# Patient Record
Sex: Male | Born: 1935
Health system: Southern US, Community
[De-identification: ages and names within clinical notes are randomized; demographics above are authoritative.]

## PROBLEM LIST (undated history)

## (undated) DIAGNOSIS — R0602 Shortness of breath: Secondary | ICD-10-CM

## (undated) DIAGNOSIS — C61 Malignant neoplasm of prostate: Secondary | ICD-10-CM

## (undated) DIAGNOSIS — K5730 Diverticulosis of large intestine without perforation or abscess without bleeding: Secondary | ICD-10-CM

## (undated) DIAGNOSIS — G4730 Sleep apnea, unspecified: Secondary | ICD-10-CM

## (undated) DIAGNOSIS — I2510 Atherosclerotic heart disease of native coronary artery without angina pectoris: Secondary | ICD-10-CM

## (undated) DIAGNOSIS — Z860100 Personal history of colon polyps, unspecified: Secondary | ICD-10-CM

## (undated) DIAGNOSIS — Z720 Tobacco use: Secondary | ICD-10-CM

## (undated) DIAGNOSIS — I4510 Unspecified right bundle-branch block: Secondary | ICD-10-CM

## (undated) DIAGNOSIS — K627 Radiation proctitis: Secondary | ICD-10-CM

## (undated) DIAGNOSIS — I6529 Occlusion and stenosis of unspecified carotid artery: Secondary | ICD-10-CM

## (undated) DIAGNOSIS — K279 Peptic ulcer, site unspecified, unspecified as acute or chronic, without hemorrhage or perforation: Secondary | ICD-10-CM

## (undated) DIAGNOSIS — E119 Type 2 diabetes mellitus without complications: Secondary | ICD-10-CM

## (undated) DIAGNOSIS — J40 Bronchitis, not specified as acute or chronic: Secondary | ICD-10-CM

## (undated) DIAGNOSIS — C4490 Unspecified malignant neoplasm of skin, unspecified: Secondary | ICD-10-CM

## (undated) DIAGNOSIS — E669 Obesity, unspecified: Secondary | ICD-10-CM

## (undated) DIAGNOSIS — I219 Acute myocardial infarction, unspecified: Secondary | ICD-10-CM

## (undated) DIAGNOSIS — I10 Essential (primary) hypertension: Secondary | ICD-10-CM

## (undated) HISTORY — PX: COLONOSCOPY W/ POLYPECTOMY: SHX1380

## (undated) HISTORY — DX: Peptic ulcer, site unspecified, unspecified as acute or chronic, without hemorrhage or perforation: K27.9

## (undated) HISTORY — DX: Malignant neoplasm of prostate: C61

## (undated) HISTORY — PX: CHOLECYSTECTOMY: SHX55

## (undated) HISTORY — DX: Type 2 diabetes mellitus without complications: E11.9

## (undated) HISTORY — DX: Sleep apnea, unspecified: G47.30

## (undated) HISTORY — DX: Essential (primary) hypertension: I10

## (undated) HISTORY — PX: HERNIA REPAIR: SHX51

## (undated) HISTORY — DX: Atherosclerotic heart disease of native coronary artery without angina pectoris: I25.10

## (undated) HISTORY — DX: Obesity, unspecified: E66.9

---

## 1992-01-07 HISTORY — PX: OTHER SURGICAL HISTORY: SHX169

## 1997-08-18 ENCOUNTER — Inpatient Hospital Stay (HOSPITAL_COMMUNITY): Admission: EM | Admit: 1997-08-18 | Discharge: 1997-08-20 | Payer: Self-pay | Admitting: Family Medicine

## 1999-01-07 DIAGNOSIS — C61 Malignant neoplasm of prostate: Secondary | ICD-10-CM

## 1999-01-07 HISTORY — DX: Malignant neoplasm of prostate: C61

## 1999-06-10 ENCOUNTER — Other Ambulatory Visit: Admission: RE | Admit: 1999-06-10 | Discharge: 1999-06-10 | Payer: Self-pay | Admitting: Urology

## 1999-07-15 ENCOUNTER — Encounter: Admission: RE | Admit: 1999-07-15 | Discharge: 1999-10-13 | Payer: Self-pay | Admitting: Radiation Oncology

## 2000-05-11 ENCOUNTER — Ambulatory Visit (HOSPITAL_COMMUNITY): Admission: RE | Admit: 2000-05-11 | Discharge: 2000-05-11 | Payer: Self-pay | Admitting: Gastroenterology

## 2000-05-11 HISTORY — PX: COLONOSCOPY: SHX174

## 2003-12-12 ENCOUNTER — Inpatient Hospital Stay (HOSPITAL_COMMUNITY): Admission: EM | Admit: 2003-12-12 | Discharge: 2003-12-14 | Payer: Self-pay | Admitting: Emergency Medicine

## 2003-12-13 HISTORY — PX: CARDIAC CATHETERIZATION: SHX172

## 2004-01-25 ENCOUNTER — Ambulatory Visit: Payer: Self-pay | Admitting: Cardiology

## 2004-04-11 ENCOUNTER — Ambulatory Visit: Payer: Self-pay | Admitting: Cardiology

## 2004-06-06 ENCOUNTER — Ambulatory Visit: Payer: Self-pay | Admitting: Cardiology

## 2004-12-25 ENCOUNTER — Ambulatory Visit: Payer: Self-pay | Admitting: Cardiology

## 2005-10-22 ENCOUNTER — Ambulatory Visit: Payer: Self-pay | Admitting: Cardiology

## 2006-11-04 ENCOUNTER — Ambulatory Visit: Payer: Self-pay | Admitting: Cardiology

## 2007-11-10 ENCOUNTER — Ambulatory Visit: Payer: Self-pay | Admitting: Cardiology

## 2009-03-14 DIAGNOSIS — E1165 Type 2 diabetes mellitus with hyperglycemia: Secondary | ICD-10-CM

## 2009-03-14 DIAGNOSIS — E119 Type 2 diabetes mellitus without complications: Secondary | ICD-10-CM | POA: Insufficient documentation

## 2009-03-14 DIAGNOSIS — I10 Essential (primary) hypertension: Secondary | ICD-10-CM | POA: Insufficient documentation

## 2009-03-14 DIAGNOSIS — I2510 Atherosclerotic heart disease of native coronary artery without angina pectoris: Secondary | ICD-10-CM | POA: Insufficient documentation

## 2009-03-14 DIAGNOSIS — G4730 Sleep apnea, unspecified: Secondary | ICD-10-CM | POA: Insufficient documentation

## 2009-03-14 DIAGNOSIS — E1129 Type 2 diabetes mellitus with other diabetic kidney complication: Secondary | ICD-10-CM

## 2009-03-16 ENCOUNTER — Ambulatory Visit: Payer: Self-pay | Admitting: Cardiology

## 2009-03-16 DIAGNOSIS — E785 Hyperlipidemia, unspecified: Secondary | ICD-10-CM | POA: Insufficient documentation

## 2009-03-16 DIAGNOSIS — E782 Mixed hyperlipidemia: Secondary | ICD-10-CM | POA: Insufficient documentation

## 2009-03-16 DIAGNOSIS — E669 Obesity, unspecified: Secondary | ICD-10-CM

## 2009-03-16 DIAGNOSIS — F17200 Nicotine dependence, unspecified, uncomplicated: Secondary | ICD-10-CM | POA: Insufficient documentation

## 2010-02-05 NOTE — Assessment & Plan Note (Signed)
Summary: APPT @ 2:45/F2Y/JML   Visit Type:  Follow-up Primary Provider:  Dr. Felipa Eth  CC:  CAD.  History of Present Illness: The patient presents for follow up of his known CAD.  It has been about 2 years since I last saw him. In that time he has had no new cardiovascular complaints. Unfortunately he continues to smoke cigarettes. He is unable to exercise because of joint and leg pains. His weight and his blood sugar are not well controlled. I do not have his most recent lipids. Despite this he denies any chest pressure, neck or arm discomfort. He has had no palpitations, presyncope or syncope. He has had no PND or orthopnea. He has no new leg swelling or cough.  Current Medications (verified): 1)  Metoprolol Succinate 25 Mg Xr24h-Tab (Metoprolol Succinate) .Marland Kitchen.. 1 By Mouth Daily 2)  Simvastatin 40 Mg Tabs (Simvastatin) .Marland Kitchen.. 1 By Mouth Daily 3)  Plavix 75 Mg Tabs (Clopidogrel Bisulfate) .Marland Kitchen.. 1 By Mouth Daily 4)  Micardis Hct 40-12.5 Mg Tabs (Telmisartan-Hctz) .Marland Kitchen.. 1 By Mouth Daily 5)  Nu-Iron 150 Mg Caps (Polysaccharide Iron Complex) .Marland Kitchen.. 1 By Mouth Daily 6)  Glucovance 2.5-500 Mg Tabs (Glyburide-Metformin) .Marland Kitchen.. 1 By Mouth Three Times A Day 7)  Aspirin 81 Mg  Tabs (Aspirin) .Marland Kitchen.. 1 By Mouth Daily  Allergies (verified): No Known Drug Allergies  Past History:  Past Medical History: Reviewed history from 03/14/2009 and no changes required. Coronary artery disease (2005 LAD 30-40% stenosis   followed by 60-65% focal stenosis.  The second diagonal had 80%   stenosis.  The circumflex had 85-90% mid stenosis and was stented with a   Taxus stent.  The obtuse marginal had 20-25% stenosis and obtuse   marginal-2 had 40-50% stenosis.  The right coronary artery had 40-50%   proximal stenosis and mid 75% stenosis), well-preserved ejection   fraction (50-55% with inferobasal hypokinesis), diabetes mellitus x13   years, hypertension for greater than 50 years, sleep apnea treated with   CPAP, prostate  cancer, peptic ulcer disease, trauma of the left arm with   near amputation, multiple left arm surgeries, cholecystectomy, right   inguinal herniorrhaphy.      Past Surgical History:  Trauma to the left arm with near-amputation, multiple left arm surgeries.  Cholecystectomy .Right inguinal hernia repair.  Review of Systems       Double vision, hip pain.  Otherwise as stated in the history of present illness negative for other systems.  Vital Signs:  Patient profile:   77 year old male Height:      66 inches Weight:      237 pounds BMI:     38.39 Pulse rate:   67 / minute Resp:     16 per minute BP sitting:   142 / 78  (right arm)  Vitals Entered By: Marrion Coy, CNA (March 16, 2009 2:48 PM)  Physical Exam  General:  Well developed, well nourished, in no acute distress. Head:  normocephalic and atraumatic Eyes:  PERRLA/EOM intact; conjunctiva and lids normal. Mouth:  Teeth, gums and palate normal. Oral mucosa normal. Neck:  Neck supple, no JVD. No masses, thyromegaly or abnormal cervical nodes. Chest Wall:  no deformities or breast masses noted Lungs:  Clear bilaterally to auscultation and percussion. Abdomen:  Bowel sounds positive; abdomen soft and non-tender without masses, organomegaly, or hernias noted. No hepatosplenomegaly, obese Msk:  Back normal, normal gait. Muscle strength and tone normal. Extremities:  status post multiple trauma surgeries Neurologic:  Alert and  oriented x 3. Skin:  Intact without lesions or rashes. Cervical Nodes:  no significant adenopathy Inguinal Nodes:  no significant adenopathy Psych:  Normal affect.   Detailed Cardiovascular Exam  Neck    Carotids: Carotids full and equal bilaterally without bruits.      Neck Veins: Normal, no JVD.    Heart    Inspection: no deformities or lifts noted.      Palpation: normal PMI with no thrills palpable.      Auscultation: regular rate and rhythm, S1, S2 without murmurs, rubs, gallops, or  clicks.    Vascular    Abdominal Aorta: no palpable masses, pulsations, or audible bruits.      Femoral Pulses: normal femoral pulses bilaterally.      Pedal Pulses: normal pedal pulses bilaterally.      Radial Pulses: normal radial pulses bilaterally.      Peripheral Circulation: no clubbing, cyanosis, or edema noted with normal capillary refill.     EKG  Procedure date:  03/16/2009  Findings:      sinus rhythm, rate 67, first degree AV block, right bundle branch block, no acute ST-T wave changes.  Impression & Recommendations:  Problem # 1:  CAD (ICD-414.00) The patient has no new symptoms. Unfortunately he is not anticipating in risk reduction. This will be reemphasized. No further testing is suggested at this point. Orders: EKG w/ Interpretation (93000)  Problem # 2:  TOBACCO ABUSE (ICD-305.1) We discussed the need to stop smoking. He says he's tried everything except Chantix and will not try this.  Problem # 3:  DYSLIPIDEMIA (ICD-272.4) He lots of muscle aches. It is prudent to try him off of simvastatin for one month to see if he has any improvement. He will let us know either way. If there is no change I will restart this.  Problem # 4:  OBESITY, UNSPECIFIED (ICD-278.00) He understands the need to lose weight with diet and exercise.  Patient Instructions: 1)  Your physician recommends that you schedule a follow-up appointment in: 12 months with Dr Antoine Poche.  You should recieve a letter of reminder approximatley 2 months before your appointment time.  If you do not please call our office to schedule. 2)  Your physician has recommended you make the following change in your medication: hold your simvastatin for one month if no improvement in your leg discomfort restart the simvastatin.

## 2010-05-01 ENCOUNTER — Encounter: Payer: Self-pay | Admitting: Cardiology

## 2010-05-01 ENCOUNTER — Encounter: Payer: Self-pay | Admitting: *Deleted

## 2010-05-02 ENCOUNTER — Encounter: Payer: Self-pay | Admitting: Cardiology

## 2010-05-02 ENCOUNTER — Ambulatory Visit (INDEPENDENT_AMBULATORY_CARE_PROVIDER_SITE_OTHER): Payer: Medicare Other | Admitting: Cardiology

## 2010-05-02 VITALS — BP 150/80 | HR 63 | Ht 68.0 in | Wt 230.0 lb

## 2010-05-02 DIAGNOSIS — I2510 Atherosclerotic heart disease of native coronary artery without angina pectoris: Secondary | ICD-10-CM

## 2010-05-02 DIAGNOSIS — F17200 Nicotine dependence, unspecified, uncomplicated: Secondary | ICD-10-CM

## 2010-05-02 DIAGNOSIS — E785 Hyperlipidemia, unspecified: Secondary | ICD-10-CM

## 2010-05-02 DIAGNOSIS — I10 Essential (primary) hypertension: Secondary | ICD-10-CM

## 2010-05-02 DIAGNOSIS — E669 Obesity, unspecified: Secondary | ICD-10-CM

## 2010-05-02 DIAGNOSIS — R0989 Other specified symptoms and signs involving the circulatory and respiratory systems: Secondary | ICD-10-CM

## 2010-05-02 MED ORDER — TELMISARTAN-HCTZ 80-12.5 MG PO TABS: 1.0000 | ORAL_TABLET | Freq: Every day | ORAL | Status: DC

## 2010-05-02 NOTE — Assessment & Plan Note (Signed)
The patient has some atypical chest pain. Unfortunately he doesn't participate and risk reduction. He has a low functional level because of knee pain. He has not had any evaluation of his coronaries since 2005. Stress perfusion imaging is indicated.

## 2010-05-02 NOTE — Assessment & Plan Note (Signed)
I will check carotid Dopplers.

## 2010-05-02 NOTE — Progress Notes (Signed)
HPI The patient presents for a one-year followup. Since I last saw him he has had some chest discomfort that is infrequent and he thinks he is gas. He will take over-the-counter medications without relief. He does not think it is similar to previous angina. This happens at rest. He does not exert himself because of knee pain. He continues to smoke cigarettes. He has tried to cut down his food intake and has lost a few pounds. He denies any new shortness of breath though he gets dyspneic with moderate exertion. He has had no PND or orthopnea. He has had no edema.  No Known Allergies  Current Outpatient Prescriptions  Medication Sig Dispense Refill  . aspirin 81 MG tablet Take 81 mg by mouth daily.        . cholestyramine light (PREVALITE) 4 G packet Take 4 g by mouth daily.        . clopidogrel (PLAVIX) 75 MG tablet Take 75 mg by mouth daily.        . metFORMIN (GLUCOPHAGE) 500 MG tablet Take 500 mg by mouth 3 (three) times daily.        . metoprolol succinate (TOPROL-XL) 25 MG 24 hr tablet Take 25 mg by mouth daily.        Marland Kitchen omeprazole (PRILOSEC) 20 MG capsule Take 20 mg by mouth daily.        . simvastatin (ZOCOR) 40 MG tablet Take 40 mg by mouth at bedtime.        Marland Kitchen telmisartan-hydrochlorothiazide (MICARDIS HCT) 40-12.5 MG per tablet Take 1 tablet by mouth daily.          Past Medical History  Diagnosis Date  . CAD (coronary artery disease)     (2005 LAD 30-40% stenosis  followed by 60-65% focal stenosis.  The second diagonal had 80%   stenosis.  The circumflex had 85-90% mid stenosis and was stented with a  Taxus stent.  The obtuse marginal had 20-25% stenosis and obtuse   marginal-2 had 40-50% stenosis.  The right coronary artery had 40-50%  proximal stenosis and mid 75% stenosis), well-preserved ejection  fraction (50-55% )  . Dyslipidemia   . Obesity   . HTN (hypertension)   . Sleep apnea   . PUD (peptic ulcer disease)   . DM (diabetes mellitus)   . Prostate cancer     Past  Surgical History  Procedure Date  . Cholecystectomy   . Cardiac catheterization     ROS:  As stated in the HPI and negative for all other systems.  PHYSICAL EXAM BP 150/80  Pulse 63  Ht 5\' 8"  (1.727 m)  Wt 230 lb (104.327 kg)  BMI 34.97 kg/m2 General:  Well developed, well nourished, in no acute distress. Head:  normocephalic and atraumatic Eyes:  PERRLA/EOM intact; conjunctiva and lids normal. Mouth:  Teeth, gums and palate normal. Oral mucosa normal. Neck:  Neck supple, no JVD. No masses, thyromegaly or abnormal cervical nodes. Chest Wall:  no deformities or breast masses noted Lungs:  Clear bilaterally to auscultation and percussion. Abdomen:  Bowel sounds positive; abdomen soft and non-tender without masses, organomegaly, or hernias noted. No hepatosplenomegaly, obese Msk:  Back normal, normal gait. Muscle strength and tone normal. Extremities:  status post multiple trauma surgeries Neurologic:  Alert and oriented x 3. Skin:  Intact without lesions or rashes. Cervical Nodes:  no significant adenopathy Inguinal Nodes:  no significant adenopathy Psych:  Normal affect.   Detailed Cardiovascular Exam  Neck  Carotids: Carotids full and equal bilaterally right bruit.      Neck Veins: Normal, no JVD.    Heart    Inspection: no deformities or lifts noted.      Palpation: normal PMI with no thrills palpable.      Auscultation: regular rate and rhythm, S1, S2 soft apical systolic early peaking murmur, rubs, gallops, or clicks.    Vascular    Abdominal Aorta: no palpable masses, pulsations, or audible bruits.      Femoral Pulses: normal femoral pulses bilaterally.      Pedal Pulses: normal pedal pulses bilaterally.      Radial Pulses: normal radial pulses bilaterally.      Peripheral Circulation: no clubbing, cyanosis, or edema noted with normal capillary refill.    EKG:  Sinus rhythm, right bundle branch block, no acute ST-T wave changes  ASSESSMENT AND PLAN

## 2010-05-02 NOTE — Assessment & Plan Note (Signed)
His blood pressure has not been at target. I will increase his Telmisartan/hct to 80/12.5 With a basic metabolic profile in 2 weeks.

## 2010-05-02 NOTE — Assessment & Plan Note (Signed)
This is followed by Dr. Felipa Eth.

## 2010-05-02 NOTE — Assessment & Plan Note (Signed)
We have discussed this at length. He says he cannot quit smoking. He does not want any smoking cessation aids.

## 2010-05-02 NOTE — Patient Instructions (Signed)
You are being scheduled for a stress myoview.  Please see the instruction sheet provided. You are being scheduled for a carotid doppler. Increase Micardis to 80/12.5 mg a day. (RX send to [facility] on [address]) Have blood work in 2 weeks (BMP 401.1 v58.69) See Dr Antoine Poche back in 1 year

## 2010-05-02 NOTE — Assessment & Plan Note (Signed)
The patient understands the need to lose weight with diet and exercise. We have discussed specific strategies for this.  

## 2010-05-07 ENCOUNTER — Encounter: Payer: Self-pay | Admitting: Cardiology

## 2010-05-14 ENCOUNTER — Encounter (INDEPENDENT_AMBULATORY_CARE_PROVIDER_SITE_OTHER): Payer: Medicare Other | Admitting: *Deleted

## 2010-05-14 ENCOUNTER — Ambulatory Visit (HOSPITAL_COMMUNITY): Payer: Medicare Other | Attending: Cardiology | Admitting: Radiology

## 2010-05-14 ENCOUNTER — Other Ambulatory Visit (INDEPENDENT_AMBULATORY_CARE_PROVIDER_SITE_OTHER): Payer: Medicare Other | Admitting: *Deleted

## 2010-05-14 VITALS — Ht 68.0 in | Wt 227.0 lb

## 2010-05-14 DIAGNOSIS — R0789 Other chest pain: Secondary | ICD-10-CM

## 2010-05-14 DIAGNOSIS — I2510 Atherosclerotic heart disease of native coronary artery without angina pectoris: Secondary | ICD-10-CM

## 2010-05-14 DIAGNOSIS — I10 Essential (primary) hypertension: Secondary | ICD-10-CM

## 2010-05-14 DIAGNOSIS — I4510 Unspecified right bundle-branch block: Secondary | ICD-10-CM

## 2010-05-14 DIAGNOSIS — I6529 Occlusion and stenosis of unspecified carotid artery: Secondary | ICD-10-CM

## 2010-05-14 DIAGNOSIS — R0989 Other specified symptoms and signs involving the circulatory and respiratory systems: Secondary | ICD-10-CM

## 2010-05-14 DIAGNOSIS — R0609 Other forms of dyspnea: Secondary | ICD-10-CM

## 2010-05-14 LAB — BASIC METABOLIC PANEL
Calcium: 9.2 mg/dL (ref 8.4–10.5)
Creatinine, Ser: 1.5 mg/dL (ref 0.4–1.5)

## 2010-05-14 MED ORDER — TECHNETIUM TC 99M TETROFOSMIN IV KIT
11.0000 | PACK | Freq: Once | INTRAVENOUS | Status: AC | PRN
  Administered 2010-05-14: 11 via INTRAVENOUS

## 2010-05-14 MED ORDER — REGADENOSON 0.4 MG/5ML IV SOLN
0.4000 mg | Freq: Once | INTRAVENOUS | Status: AC
  Administered 2010-05-14: 0.4 mg via INTRAVENOUS

## 2010-05-14 MED ORDER — TECHNETIUM TC 99M TETROFOSMIN IV KIT
33.0000 | PACK | Freq: Once | INTRAVENOUS | Status: AC | PRN
  Administered 2010-05-14: 33 via INTRAVENOUS

## 2010-05-14 NOTE — Progress Notes (Addendum)
[facility] SITE 3 NUCLEAR MED [address] [place] [address] [phone]  Cardiology Nuclear Med Study  [name] [name] is a 77 y.o. male [id number] 02-23-1932   Nuclear Med Background Indication for Stress Test:  Evaluation for Ischemia, PTCA/Stent Patency  History:  '05 PTCA/Stent-CFX, EF=50-55% Cardiac Risk Factors: Family History - CAD, Hypertension, Lipids, NIDDM, Obesity, RBBB, Smoker and Carotid Bruit  Symptoms:  Chest Tightness (last episode of chest discomfort was about six months ago), DOE and Fatigue   Nuclear Pre-Procedure Caffeine/Decaff Intake:  None NPO After: 9:00pm   Lungs:  Diminished, but clear.  O2 sat 96% on RA. IV 0.9% NS with Angio Cath:  24g  IV Site: R Wrist  IV Started by:  Irean Hong, RN  Chest Size (in):  48 Cup Size: n/a  Height: 5\' 8"  (1.727 m)  Weight:  227 lb (102.967 kg)  BMI:  Body mass index is 34.52 kg/(m^2). Tech Comments:  Toprol held x 24 hours.  No medications taken today.    Nuclear Med Study 1 or 2 day study: 1 day  Stress Test Type:  Eugenie Birks  Reading MD: Arvilla Meres, MD  Order Authorizing Provider:  [name], MD  Resting Radionuclide: Technetium 25m Tetrofosmin  Resting Radionuclide Dose: 11.0 mCi   Stress Radionuclide:  Technetium 41m Tetrofosmin  Stress Radionuclide Dose: 33.0 mCi           Stress Protocol Rest HR: 59 Stress HR: 75  Rest BP: 125/47 Stress BP: 109/52  Exercise Time (min): n/a METS: n/a   Predicted Max HR: 145 bpm % Max HR: 51.72 bpm Rate Pressure Product: 8175   Dose of Adenosine (mg):  n/a Dose of Lexiscan: 0.4 mg  Dose of Atropine (mg): n/a Dose of Dobutamine: n/a mcg/kg/min (at max HR)  Stress Test Technologist: Smiley Houseman, CMA-N  Nuclear Technologist:  Domenic Polite, CNMT     Rest Procedure:  Myocardial perfusion imaging was performed at rest 45 minutes following the intravenous administration of Technetium 64m Tetrofosmin. Rest ECG: RBBB.  Stress  Procedure:  The patient received IV Lexiscan 0.4 mg over 15-seconds.  Technetium 66m Tetrofosmin injected at 30-seconds.  There were no significant changes with Lexiscan.  Quantitative spect images were obtained after a 45 minute delay. Stress ECG: No significant change from baseline ECG  QPS Raw Data Images:  Patient motion noted; appropriate software correction applied. Stress Images:  There is moderately decreased uptake in the inferior wall. Rest Images:  There is mildly decreased uptake in the inferior wall. Subtraction (SDS): There is a mild reversible defect in the inferior wall suggestive of ischemia (cannot exclude diaphragmatic attenuation).  Transient Ischemic Dilatation (Normal <1.22):  0.99 Lung/Heart Ratio (Normal <0.45):  0.31  Quantitative Gated Spect Images QGS EDV:  125 ml QGS ESV:  49 ml QGS cine images:  NL LV Function; NL Wall Motion QGS EF: 61%  Impression Exercise Capacity:  Lexiscan with no exercise. BP Response:  n/a Clinical Symptoms:  n/a ECG Impression:  No significant ECG changes with Lexiscan. Comparison with Prior Nuclear Study: No previous nuclear study performed  Overall Impression:  Abnormal stress nuclear study. There is a mildly reversible defect in the inferior wall suggestive of possible ischemia (versus diaphragmatic attenuation).    [name] [name]   There is a defect.  I will bring him back to discuss these results.  Consider medical management vs. Cath.  [name]

## 2010-05-15 NOTE — Progress Notes (Signed)
COPY ROUTED TO DR.HOCHREIN.Falecha Clark ° °

## 2010-05-16 ENCOUNTER — Encounter: Payer: Self-pay | Admitting: Cardiology

## 2010-05-21 NOTE — Assessment & Plan Note (Signed)
[name]                            CARDIOLOGY OFFICE NOTE   ELLIAS, MCELREATH                         MRN:          [id number]  DATE:11/04/2006                            DOB:          Nov 12, 1932    The primary is [name] [name].   REASON FOR PRESENTATION:  Evaluate patient with coronary disease.   HISTORY OF PRESENT ILLNESS:  The patient is now 77 years old. He  presents for follow up of his coronary disease. In the last year, he has  been limited by bilateral knee pain. He blames this on his medicines.  However, he does not want to have any evaluation by an orthopedist, as  he would not want any surgery. He denies any chest discomfort, neck  discomfort, arm discomfort, activity-induced nausea, vomiting, excessive  diaphoresis. He has not been having any palpitations, presyncope or  syncope. He has been having no PND or orthopnea. He continues to smoke  cigarettes unfortunately.   PAST MEDICAL HISTORY:  1. Coronary artery disease (2005, LAD 30 to 40% long stenosis followed      by 60-65% focal stenosis, second diagonal had 80% stenosis, the      circumflex had 85-90% mid stenosis and was stented with the Taxus      stent. An obtuse marginal had 20-25% stenosis. An obtuse marginal 2      had 40-50% stenosis. The right coronary artery had 40-50% proximal      stenosis and mid 75% stenosis).  2. Well-preserved ejection fraction (50-55% with inferobasilar      hypokinesis).  3. Diabetes mellitus for 12 years.  4. Hypertension for greater than 50 years.  5. Sleep apnea treated with CPAP.  6. Prostate cancer.  7. Peptic ulcer disease.  8. Trauma to the left arm with near amputation.  9. Multiple left arm surgeries.  10.Cholecystectomy.  11.Right inguinal herniorrhaphy.   ALLERGIES:  None.   1. Plavix 75 mg daily.  2. Aspirin 81 mg daily.  3. Toprol 25 mg daily.  4. Simvastatin 40 mg daily.  5. Glucovance 2.5/500 b.i.d.  6. Micardis 40/12.5  daily.  7. Omega 3 fish oil.   REVIEW OF SYSTEMS:  As stated in the HPI. Positive for erectile  dysfunction. Negative for other systems.   PHYSICAL EXAMINATION:  The patient is in no distress. Blood pressure  122/70, heart rate 60 and regular, weight 235 pounds, body mass index  36.  HEENT:  Eyelids unremarkable; pupils are equal, round, and reactive to  light; fundi not visualized. Oral mucosal unremarkable.  NECK:  No jugular venous distention at 45 degrees; carotid upstrokes  brisk and symmetrical; no bruits; no thyromegaly.  LYMPHATICS:  No cervical, axillary, or inguinal adenopathy.  LUNGS:  Clear to auscultation bilaterally.  BACK:  No costovertebral angle tenderness.  CHEST:  Unremarkable.  HEART:  PMI not displaced or sustained; S1 and S2 within normal limits;  no S3, no S4, no clicks, no rubs, no murmurs.  ABDOMEN:  Obese, positive bowel sounds, normal in frequency and pitch,  no bruits, no rebound, no guarding,  no midline pulsatile mass, no  hepatomegaly, no splenomegaly.  SKIN:  No rashes, no nodules.  EXTREMITIES:  2+ pulses, no edema.   EKG:  Sinus rhythm, right bundle branch block, first degree AV block, no  acute ST-T wave changes.   ASSESSMENT AND PLAN:  1. Coronary disease. The patient has coronary disease as described. He      is not having any further cardiovascular symptoms. He is somewhat      limited functionally. However, I am going to manage this patient      medically again this year. He will continue with risk reduction.  2. Tobacco. I counseled about the need to stop smoking. I do not think      he will do this.  3. Obesity. He understands he needs to loose weight with diet and      exercise.  4. Hypertension. Blood pressure is controlled. He will continue the      medications as listed. Of note, his beta blocker had to be reduced      because of bradycardia.  5. Dyslipidemia. He is concerned that this medications are causing      knee pain. I doubt  that this is the case. He probably has      osteoarthritis given his obesity. He refuses to see an orthopedist.      It would not be unreasonable to try one month off of the      simvastatin. If he has resolution of symptoms, I suggest he restart      it to see if they return. If so, then we clearly have demonstrated      it is the statin, and he could be treated with an alternative.  6. Followup. I will see him back in one year or sooner if needed.     [name], MD, [facility]  Electronically Signed    JH/MedQ  DD: 11/04/2006  DT: 11/05/2006  Job #: [id number]

## 2010-05-21 NOTE — Assessment & Plan Note (Signed)
[facility]                            CARDIOLOGY OFFICE NOTE   ROLDAN, LAFOREST                         MRN:          [id number]  DATE:11/10/2007                            DOB:          1932-07-07    PRIMARY CARE PHYSICIAN:  Larina Earthly, MD   REASON FOR PRESENTATION:  Evaluate the patient with coronary artery  disease.   HISTORY OF PRESENT ILLNESS:  The patient is now 77 years old.  He  presents for yearly followup.  Since I last saw him, he has had no  cardiovascular complaints.  He does complain of some aching in his leg  when he sits for too long.  This is his left leg.  He also has pain in  his knees with walking.  This somewhat limits him.  However, he does not  get any of the chest discomfort, he had in 2005 at the time of his  angioplasty.  He does not get any neck discomfort or arm discomfort.  He  has no new shortness of breath, though he gets dyspneic with moderate  exertion.  He is not having any resting shortness of breath, denies any  PND or orthopnea.  He has had no palpitation, presyncope, or syncope.   PAST MEDICAL HISTORY:  Coronary artery disease (2005 LAD 30-40% stenosis  followed by 60-65% focal stenosis.  The second diagonal had 80%  stenosis.  The circumflex had 85-90% mid stenosis and was stented with a  Taxus stent.  The obtuse marginal had 20-25% stenosis and obtuse  marginal-2 had 40-50% stenosis.  The right coronary artery had 40-50%  proximal stenosis and mid 75% stenosis), well-preserved ejection  fraction (50-55% with inferobasal hypokinesis), diabetes mellitus x13  years, hypertension for greater than 50 years, sleep apnea treated with  CPAP, prostate cancer, peptic ulcer disease, trauma of the left arm with  near amputation, multiple left arm surgeries, cholecystectomy, right  inguinal herniorrhaphy.   ALLERGIES:  None.   MEDICATIONS:  1. Micardis HCT 40/12.5 daily.  2. Glyburide/metformin 2.5/500 two pills  q.a.m. and one pill q.p.m.  3. Plavix 75 mg daily.  4. Nu-Iron.  5. Simvastatin 40 mg daily.  6. Metoprolol 25 mg daily.  7. Aspirin 81 mg daily.   REVIEW OF SYSTEMS:  As stated in the HPI and otherwise negative for  other systems.   PHYSICAL EXAMINATION:  GENERAL:  The patient is in no distress.  VITAL SIGNS:  Blood pressure 134/72, heart rate 63 and regular, weight  235 pounds, body mass index 35.  NECK:  No jugular venous distention at 45 degrees.  Carotid upstroke  brisk and symmetric.  No bruits.  No thyromegaly.  LYMPHATICS:  No cervical, axillary or inguinal adenopathy.  LUNGS:  Clear to auscultation bilaterally.  BACK:  No costovertebral mass.  CHEST:  Unremarkable.  HEART:  PMI not displaced or sustained.  S1 and S2 within normal limits.  No S3, no S4, no clicks, no rubs, no murmurs.  ABDOMEN:  Obese, positive bowel sounds normal in frequency and pitch, no  bruits, no rebound, no  guarding or midline pulsatile mass.  No  hepatomegaly, no splenomegaly.  SKIN:  No rashes, no nodules.  EXTREMITIES:  2+ pulse, no edema.  NEURO:  Grossly intact.   EKG sinus rhythm, rate 63, right bundle-branch block, first-degree AV  block, no acute ST-T wave changes, no significant change from previous  EKGs.   ASSESSMENT AND PLAN:  1. Coronary artery disease.  The patient has no new symptoms.  No      further cardiovascular testing is suggested.  He should continue      with risk reduction.  2. Diabetes per Dr. Felipa Eth.  3. Dyslipidemia.  The patient's at the last lab that I saw had a low      HDL, though his LDL was in the 40s.  His triglycerides were      elevated.  He was told to start taking fish oil which he does try      to do.  It gives him some diarrhea so he takes it when he can.  He      will continue on the simvastatin as well.  This can be followed by      Dr. Felipa Eth with a goal LDL less than 70 and HDL greater than 40.  4. Obesity.  He understands the need to lose weight with  diet and      exercise, although the exercise part will be very difficult.  5. Followup.  I will see him back in 1 year or sooner if needed.     [name], MD, [facility]  Electronically Signed    JH/MedQ  DD: 11/10/2007  DT: 11/11/2007  Job #: [id number]   cc:   Larina Earthly, M.D.

## 2010-05-23 ENCOUNTER — Telehealth: Payer: Self-pay | Admitting: *Deleted

## 2010-05-23 NOTE — Telephone Encounter (Signed)
Attempted to call pt with results of myoview and to make him aware of a follow up appt scheduled for Monday 05/27/10 at 9:15am with Dr [name].  There was no answer and I was unable to leave a message.  Will forward to the triage nurse to continue to contact pt tomorrow.  Results of myoview read:  Abnormal stress nuclear study.  There is a mildly reversible defect in the inferior wall suggestive of possible ischemia versus diaphragmatic attenuation.  Dr Antoine Poche would like to see the pt to review the results, his symptoms and discuss further treatment.

## 2010-05-24 NOTE — Procedures (Signed)
[place]. [facility]  Patient:    [name] [name], [name] [name]                         MRN: [id number] Proc. Date: 05/11/00 Adm. Date:  [id number] Attending:  Rich Brave CC:         Monica Becton, M.D.   Procedure Report  PROCEDURE PERFORMED:  Colonoscopy.  ENDOSCOPIST:  Florencia Reasons, M.D.  INDICATIONS FOR PROCEDURE:  The patient is a 77 year old with prior history of colonic adenomas having been removed, with his most recent exam, five years ago, having been negative.  FINDINGS:  Mild radiation proctitis and minimal sigmoid diverticulosis.  DESCRIPTION OF PROCEDURE:  The nature, purpose and risks of the procedure were familiar to the patient, who provided written consent.  Sedation was fentanyl 60 mcg and Versed 6 mg IV without arrhythmias or desaturation.  Digital exam showed a diminutive prostate without any mass effect.  The Olympus adjustable tension pediatric colonoscope was advanced the cecum without much difficulty, turning the patient onto his back and applying some external abdominal compression.  The cecum was identified by clear visualization of the appendiceal orifice.  Pullback was then performed.  The quality of the prep was excellent and it is felt that all areas were well seen.  The patient had some mild radiation proctitis characterized by telangiectatic changes in the distal rectum, but this did not appear to be hemorrhagic or friable.  He also had some mild sigmoid diverticulosis.  No polyps, cancer, colitis or vascular malformations were noted other than the radiation proctitis noted above.  Retroflexion was not performed in the rectum.  No biopsies were obtained.  The patient tolerated the procedure well.  There were no apparent complications.  IMPRESSION: 1. Mild radiation proctitis. 2. Mild sigmoid diverticulosis.  PLAN: Consider follow-up colonoscopy in five years if the patient remains in good general health in  the interim. DD:  05/11/00 TD:  05/11/00 Job: [id number]

## 2010-05-24 NOTE — Telephone Encounter (Signed)
Spoke to pt's daughter,donell, and gave results of stress test--advised dr hochrein would like to see mr [name] [name] on Monday 5/21 at 0915am to dicuss --pt's daughter agrees and will bring father--nt

## 2010-05-24 NOTE — Cardiovascular Report (Signed)
NAME:  [name] [name], [name] [name]                  ACCOUNT NO.:  [id number]   MEDICAL RECORD NO.:  [id number]          PATIENT TYPE:  INP   LOCATION:  6529                         FACILITY:  [facility]   PHYSICIAN:  Mohan N. Sharyn Lull, M.D. DATE OF BIRTH:  1932/09/24   DATE OF PROCEDURE:  12/13/2003  DATE OF DISCHARGE:                              CARDIAC CATHETERIZATION   PROCEDURE:  1.  Left cardiac catheterization with selective left and right coronary      angiography and left ventriculography via the right groin using Judkins      technique.  2.  Successful percutaneous transluminal coronary angioplasty to mid left      circumflex using 3.0 x 9-mm long Maverick balloon.  3.  Successful deployment of 3.0 x 12-mm long Taxus drug-eluting stent in      mid left circumflex.  4.  Successful post dilatation of the stent using 3.5 x 8-mm long Quantum      Maverick balloon.   INDICATIONS:  Mr. [name] [name] is a 77 year old white male with past medical  history significant for hypertension, noninsulin-dependent diabetes  mellitus, tobacco abuse, morbid obesity, positive family history of coronary  artery disease.  He came to the emergency room complaining of retrosternal  chest pain across the chest described as tightness, pressure, associated  with nausea and diaphoresis grade 8/10.  Received baby aspirin, sublingual  nitro with relief.  States has been having chest pain off and on for one  week lasting few minutes radiating to the left arm and felt like knot in the  throat.  Denies any shortness of breath.  Denies palpitation,  lightheadedness or syncope. Denies PND, orthopnea, or leg swelling.  Denies  relation of chest pain to food or breathing, or movement.  His EKG done in  the emergency room showed normal sinus rhythm with first-degree AV block and  right bundle branch block.  His troponin I point of care two sets were less  than 0.05, third set was 0.09 which was slightly elevated and in lab his  troponin  was 0.84 which was slightly elevated.  Due to typical prolonged  anginal chest pain, mildly elevated troponin I suggestive of small non-Q-  wave myocardial infarction, discussed with patient regarding left cath,  possible percutaneous transluminal coronary angioplasty and stenting, its  risks; i.e., death, myocardial infarction, stroke, need for emergency  coronary artery bypass graft, risk of restenosis, local vascular  complications, etc., and consented for PCI.   PROCEDURE:  After obtaining informed consent, the patient was brought to the  cath lab and was placed on fluoroscopy table. Right groin was prepped and  draped in usual fashion.  2% Xylocaine was used for local anesthesia in the  right groin.  With the help of thin-wall needle, a 6-French arterial sheath  was placed.  The sheath was aspirated and flushed.  Next, 6-French left  Judkins catheter was advanced over the wire under fluoroscopic guidance up  to the ascending aorta.  Wire was pulled out and the catheter was aspirated  and connected to the manifold. Catheter was further advanced and  engaged  into left coronary ostium.  Multiple views of the left system were taken.  Next, the catheter was disengaged and was pulled out over the wire and was  replaced with 6-French right Judkins catheter which was advanced over the  wire under fluoroscopic guidance to the ascending aorta.  Wire was pulled  out, the catheter was aspirated and connected to the manifold.  Catheter was  further advanced and engaged into right coronary ostium.  Multiple views of  the right system were taken.  Next, the catheter was disengaged and was  pulled out over the wire and was replaced with 6-French pigtail catheter  which was advanced over wire under fluoroscopic guidance up to the ascending  aorta.  Wire was pulled out, the catheter was aspirated and connected to the  manifold.  Catheter was further advanced across the aortic valve into the  left  ventricle.  Left ventricular pressures were recorded.  Next, left  ventriculography was done in 30-degree RAO position.  Post angiographic  pressures were recorded from LV and then pullback pressures were recorded  from the aorta.  There was no gradient across the aortic valve.  Next, the  pigtail catheter was pulled out over the wire, sheaths aspirated and  flushed.   FINDINGS:  The LV showed inferobasal wall severe hypokinesis, EF 50-55%.  Left main was patent.  LAD has 30-40% long, proximal stenosis and 60-65%  distal focal stenosis.  Diagonal one was very small.  Diagonal two was very,  very small and has 80% proximal stenosis.  Vessel is less than 1.5 mm  supplying only small area of myocardium.  Left circumflex has 85-90% mid  focal stenosis before tapering down into AV groove.  OM-1 has 20-25%  proximal stenosis which bifurcates into superior and inferior branch.  OM-2  has 40-50% proximal stenosis.  RCA has 40-50% multiple sequential lesions in  proximal and mid portion and 70-75% distal stenosis.  Distally, vessel is  small.   INTERVENTIONAL PROCEDURE:  Successful percutaneous transluminal coronary  angioplasty to mid left circumflex was done using 3.0 x 9-mm long Maverick  ballon for predilatation and then 3.0 x 12-mm long Taxus drug-eluting stent  was deployed at 10 atmospheric pressure which was fully expanded going up to  15 atmospheric pressures.  Stent was post dilated using 3.5 x 8-mm long  Quantum Maverick balloon going up to 11 atmospheric pressure.  Lesion  dilated from 85-90% to 0% residual with excellent TIMI-3 distal flow without  evidence of dissection or distal embolization.  The patient received weight-  based heparin, integrilin and 600 mg of Plavix during the procedure.  The  patient tolerated the procedure well.  There were no complications.  The  patient was transferred to recovery room in stable condition.       MNH/MEDQ  D:  12/13/2003  T:  12/13/2003   Job:  [id number]

## 2010-05-24 NOTE — Assessment & Plan Note (Signed)
[facility]                              CARDIOLOGY OFFICE NOTE   [name] [name], [name] [name]                         MRN:          [id number]  DATE:10/22/2005                            DOB:          Jan 27, 1932    PRIMARY CARE PHYSICIAN:  Dr. Vernon Prey.   REASON FOR PRESENTATION:  Evaluate patient with coronary disease.   HISTORY OF PRESENT ILLNESS:  The patient returns for yearly followup.  Since  I last saw him, he denies any cardiac complaints.  He does do a little bit  of work at [employer] part-time.  However, he says he does not  overexert himself.  He is still smoking 10 cigarettes a day.  He is  obviously not watching his diet.  He is not participating in cardiac risk  reduction as much as I would like.  With his activities of daily living, he  denies any chest discomfort, neck discomfort, arm discomfort, activity-  induced nausea, vomiting, excessive diaphoresis.  He has no palpitations,  presyncope or syncope.  He denies any PND or orthopnea.   PAST MEDICAL HISTORY:  1. Coronary artery disease (see the Jun 05, 2004 note for details).  2. The patient has a Taxus stent placed to the circumflex.  3. Diabetes mellitus.  4. Hypertension.  5. Sleep apnea, treated with CPAP.  6. Prostate cancer.  7. Peptic ulcer disease.  8. 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  9. Cholecystectomy.  10.Right inguinal hernia repair.   ALLERGIES:  None.   MEDICATIONS:  1. Toprol XL 25 mg daily.  2. Plavix 75 mg daily.  3. Niaspan 500 mg daily.  4. Hyzaar 50/12.5 mg daily.  5. Welchol 625 mg a day.  6. Lipitor 10 mg daily.  7. Glucosamine 2.5/500 mg a day.  8. Aspirin 81 mg a day.   REVIEW OF SYSTEMS:  As stated in the HPI, and otherwise negative for other  systems.   PHYSICAL EXAMINATION:  The patient is in no distress.  Blood pressure  156/72, heart rate 66 and regular, weight 234 pounds, body mass index  36.  HEENT:  Eyelids unremarkable, pupils equal, round and reactive to light,  fundi not visualized, oral mucosa unremarkable.  NECK:  No jugular venous distention at 45 degrees, carotid upstroke brisk  and symmetric, no bruits, no thyromegaly.  LYMPHATICS:  No cervical, axillary or inguinal adenopathy.  LUNGS:  Clear to auscultation bilaterally.  BACK:  No costovertebral angle tenderness.  CHEST:  Unremarkable.  HEART:  PMI not displaced or sustained, S1 and S2 within normal limits, no  S3, no S4, no murmurs.  ABDOMEN:  Morbidly obese, positive bowel sounds, normal in frequency and  pitch, no bruits, no rebound, no guarding, no midline pulsatile mass, no  hepatomegaly, no splenomegaly.  SKIN:  No rashes, no nodules.  EXTREMITIES:  2+ pulses throughout, no edema, no cyanosis, no clubbing.  NEUROLOGIC:  Oriented to person, place and time, cranial nerves II-XII  grossly intact, motor grossly intact.  EKG sinus rhythm, rate 66, first degree A-V block, right bundle branch  block, no change from previous EKGs.   ASSESSMENT AND PLAN:  1. Coronary disease.  The patient is having no symptoms suggestive of      recurrent obstruction.  Unfortunately, he is not participating in      secondary risk reduction.  He has been educated, but I do not think he      has any intention of complying.  Because of this, I think he needs to      be on the Plavix, as he is higher risk for future events.  2. Obesity.  He understands the need to lose weight with diet and      exercise.  3. Tobacco.  He understands that I want him to quit altogether.  He says      he is not going to be able to do this.  4. Hypertension.  His blood pressure is elevated.  I am going to increase      his metoprolol to 50 mg daily and give him the generic for this.  5. Dyslipidemia.  Per Dr. Christell Constant, with a goal LDL of less than 100 and HDL      in the 40s.  6. Followup.  I will see him back in 1 year, or sooner if  needed.            ______________________________  [name], MD, [facility]     JH/MedQ  DD:  10/22/2005  DT:  10/24/2005  Job #:  [id number]   cc:   Ernestina Penna, M.D.
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
## 2010-05-27 ENCOUNTER — Ambulatory Visit (INDEPENDENT_AMBULATORY_CARE_PROVIDER_SITE_OTHER): Payer: Medicare Other | Admitting: Cardiology

## 2010-05-27 ENCOUNTER — Encounter: Payer: Self-pay | Admitting: Cardiology

## 2010-05-27 DIAGNOSIS — F17200 Nicotine dependence, unspecified, uncomplicated: Secondary | ICD-10-CM

## 2010-05-27 DIAGNOSIS — E119 Type 2 diabetes mellitus without complications: Secondary | ICD-10-CM

## 2010-05-27 DIAGNOSIS — E669 Obesity, unspecified: Secondary | ICD-10-CM

## 2010-05-27 DIAGNOSIS — I2510 Atherosclerotic heart disease of native coronary artery without angina pectoris: Secondary | ICD-10-CM

## 2010-05-27 DIAGNOSIS — E785 Hyperlipidemia, unspecified: Secondary | ICD-10-CM

## 2010-05-27 DIAGNOSIS — I10 Essential (primary) hypertension: Secondary | ICD-10-CM

## 2010-05-27 NOTE — Progress Notes (Signed)
Pt scheduled to be seen 05/27/2010 by Dr Antoine Poche to discuss further therapy

## 2010-05-27 NOTE — Assessment & Plan Note (Signed)
He understands the need to completely abstain from cigarettes. He wants no prescriptions.

## 2010-05-27 NOTE — Assessment & Plan Note (Signed)
He has a low risk scan. He does not want catheterization. We will continue to manage this medically.

## 2010-05-27 NOTE — Assessment & Plan Note (Signed)
The patient understands the need to lose weight with diet and exercise. We have discussed specific strategies for this.  

## 2010-05-27 NOTE — Patient Instructions (Signed)
Continue current medications Follow up in 6 months with Dr Hochrein 

## 2010-05-27 NOTE — Progress Notes (Signed)
HPI The patient presents for follow up of his abnormal stress test which suggested a preserved EF but mild inferior ischemia.  Today he says that he has had no further chest pain since the last appt.  He does not want a cath at this time as his wife is being treated for CA.  Is any new shortness of breath, PND or orthopnea. He said no new palpitations, presyncope or syncope. He has not taken any nitroglycerin recently. He is cutting down on his cigarettes.  No Known Allergies  Current Outpatient Prescriptions  Medication Sig Dispense Refill  . aspirin 81 MG tablet Take 81 mg by mouth daily.        . cholestyramine light (PREVALITE) 4 G packet Take 4 g by mouth daily.        . clopidogrel (PLAVIX) 75 MG tablet Take 75 mg by mouth daily.        . metFORMIN (GLUCOPHAGE) 500 MG tablet Take 500 mg by mouth 3 (three) times daily.        . metoprolol succinate (TOPROL-XL) 25 MG 24 hr tablet Take 25 mg by mouth daily.        Marland Kitchen omeprazole (PRILOSEC) 20 MG capsule Take 20 mg by mouth daily.        . simvastatin (ZOCOR) 40 MG tablet Take 40 mg by mouth at bedtime.        Marland Kitchen telmisartan-hydrochlorothiazide (MICARDIS HCT) 80-12.5 MG per tablet Take 1 tablet by mouth daily.  30 tablet  11    Past Medical History  Diagnosis Date  . CAD (coronary artery disease)     (2005 LAD 30-40% stenosis  followed by 60-65% focal stenosis.  The second diagonal had 80%   stenosis.  The circumflex had 85-90% mid stenosis and was stented with a  Taxus stent.  The obtuse marginal had 20-25% stenosis and obtuse   marginal-2 had 40-50% stenosis.  The right coronary artery had 40-50%  proximal stenosis and mid 75% stenosis), well-preserved ejection  fraction (50-55% )  . Dyslipidemia   . Obesity   . HTN (hypertension)   . Sleep apnea   . PUD (peptic ulcer disease)   . DM (diabetes mellitus)   . Prostate cancer     Past Surgical History  Procedure Date  . Cholecystectomy   . Cardiac catheterization     ROS:  As stated  in the HPI and negative for all other systems.  PHYSICAL EXAM BP 142/60  Pulse 68  Resp 18  Ht 5\' 7"  (1.702 m)  Wt 229 lb (103.874 kg)  BMI 35.87 kg/m2 General:  Well developed, well nourished, in no acute distress. Head:  normocephalic and atraumatic Eyes:  PERRLA/EOM intact; conjunctiva and lids normal. Mouth:  Teeth, gums and palate normal. Oral mucosa normal. Neck:  Neck supple, no JVD. No masses, thyromegaly or abnormal cervical nodes. Chest Wall:  no deformities or breast masses noted Lungs:  Clear bilaterally to auscultation and percussion. Abdomen:  Bowel sounds positive; abdomen soft and non-tender without masses, organomegaly, or hernias noted. No hepatosplenomegaly, obese Msk:  Back normal, normal gait. Muscle strength and tone normal. Extremities:  status post multiple trauma surgeries Neurologic:  Alert and oriented x 3. Skin:  Intact without lesions or rashes. Cervical Nodes:  no significant adenopathy Inguinal Nodes:  no significant adenopathy Psych:  Normal affect.   Detailed Cardiovascular Exam  Neck    Carotids: Carotids full and equal bilaterally right bruit.      Neck Veins:  Normal, no JVD.    Heart    Inspection: no deformities or lifts noted.      Palpation: normal PMI with no thrills palpable.      Auscultation: regular rate and rhythm, S1, S2 soft apical systolic early peaking murmur, rubs, gallops, or clicks.    Vascular    Abdominal Aorta: no palpable masses, pulsations, or audible bruits.      Femoral Pulses: normal femoral pulses bilaterally.      Pedal Pulses: normal pedal pulses bilaterally.      Radial Pulses: normal radial pulses bilaterally.      Peripheral Circulation: no clubbing, cyanosis, or edema noted with normal capillary refill.    EKG:  Sinus rhythm, right bundle branch block, no acute ST-T wave changes  ASSESSMENT AND PLAN

## 2010-05-27 NOTE — Assessment & Plan Note (Signed)
He did not increase his ARB as suggested. He has instructions to do this.

## 2010-09-06 ENCOUNTER — Other Ambulatory Visit: Payer: Self-pay | Admitting: Internal Medicine

## 2010-09-06 ENCOUNTER — Encounter (HOSPITAL_COMMUNITY): Payer: Medicare Other | Attending: Internal Medicine

## 2010-09-06 DIAGNOSIS — E119 Type 2 diabetes mellitus without complications: Secondary | ICD-10-CM | POA: Insufficient documentation

## 2010-09-06 DIAGNOSIS — Z8546 Personal history of malignant neoplasm of prostate: Secondary | ICD-10-CM | POA: Insufficient documentation

## 2010-09-06 DIAGNOSIS — D649 Anemia, unspecified: Secondary | ICD-10-CM | POA: Insufficient documentation

## 2010-09-06 DIAGNOSIS — Z79899 Other long term (current) drug therapy: Secondary | ICD-10-CM | POA: Insufficient documentation

## 2010-09-06 LAB — ABO/RH: ABO/RH(D): O NEG

## 2010-09-07 LAB — CROSSMATCH
ABO/RH(D): O NEG
Unit division: 0

## 2010-09-11 ENCOUNTER — Ambulatory Visit: Payer: Medicare Other | Admitting: Physician Assistant

## 2010-09-12 ENCOUNTER — Ambulatory Visit (INDEPENDENT_AMBULATORY_CARE_PROVIDER_SITE_OTHER): Payer: Medicare Other | Admitting: Cardiology

## 2010-09-12 ENCOUNTER — Encounter: Payer: Self-pay | Admitting: Cardiology

## 2010-09-12 DIAGNOSIS — F17200 Nicotine dependence, unspecified, uncomplicated: Secondary | ICD-10-CM

## 2010-09-12 DIAGNOSIS — I2510 Atherosclerotic heart disease of native coronary artery without angina pectoris: Secondary | ICD-10-CM

## 2010-09-12 DIAGNOSIS — I10 Essential (primary) hypertension: Secondary | ICD-10-CM

## 2010-09-12 DIAGNOSIS — E669 Obesity, unspecified: Secondary | ICD-10-CM

## 2010-09-12 MED ORDER — METOPROLOL SUCCINATE ER 25 MG PO TB24: 25.0000 mg | ORAL_TABLET | Freq: Two times a day (BID) | ORAL | Status: DC

## 2010-09-12 NOTE — Assessment & Plan Note (Signed)
For further management of CAD and HTN I will increase the metoprolol to 25 mg bid.

## 2010-09-12 NOTE — Assessment & Plan Note (Signed)
He did have recent angina. However, this is related to his anemia. He is no longer having symptoms. He had a low risk scan earlier this year. At this point I am not planning further testing unless he has recurrent symptoms. I spoke with Dr. Felipa Eth today.  The patient will be referred for GI evaluation and we will need to ask the question whether he can continue ASA and Plavix.  For now I will not make any medication changes.

## 2010-09-12 NOTE — Patient Instructions (Addendum)
Please follow up with Dr Antoine Poche in 3 months  Increase your Metoprolol 25 mg to twice a day.  Continue all other medications as listed.

## 2010-09-12 NOTE — Assessment & Plan Note (Signed)
He is not ready to quit and he wants no prescriptions.  We discussed a specific strategy for tobacco cessation.  (Greater than three minutes discussing tobacco cessation.)

## 2010-09-12 NOTE — Progress Notes (Signed)
HPI The patient presents for follow up of chest pain.  He had this recently with exertion.  It was across his chest . It was not at rest. It would go away when he stopped his activities. It happened for about 3-4 weeks. He was not describing associated nausea vomiting or diaphoresis. Wasn't describing palpitations, presyncope or syncope. There was no new shortness of breath, PND or orthopnea. He was subsequently found to have anemia. He received 2 units of blood transfusion and his symptoms have abated. Of note earlier this year he did have a stress perfusion study with some mild inferior ischemia.  We decided at that time to manage this medically.  No Known Allergies  Current Outpatient Prescriptions  Medication Sig Dispense Refill  . aspirin 81 MG tablet Take 81 mg by mouth daily.        . cholestyramine light (PREVALITE) 4 G packet Take 4 g by mouth daily.        . clopidogrel (PLAVIX) 75 MG tablet Take 75 mg by mouth daily.        . metFORMIN (GLUCOPHAGE) 500 MG tablet Take 500 mg by mouth 3 (three) times daily.        . metoprolol succinate (TOPROL-XL) 25 MG 24 hr tablet Take 25 mg by mouth daily.        Marland Kitchen omeprazole (PRILOSEC) 20 MG capsule Take 20 mg by mouth daily.        . simvastatin (ZOCOR) 40 MG tablet Take 40 mg by mouth at bedtime.        Marland Kitchen telmisartan-hydrochlorothiazide (MICARDIS HCT) 80-12.5 MG per tablet Take 1 tablet by mouth daily.  30 tablet  11    Past Medical History  Diagnosis Date  . CAD (coronary artery disease)     (2005 LAD 30-40% stenosis  followed by 60-65% focal stenosis.  The second diagonal had 80%   stenosis.  The circumflex had 85-90% mid stenosis and was stented with a  Taxus stent.  The obtuse marginal had 20-25% stenosis and obtuse   marginal-2 had 40-50% stenosis.  The right coronary artery had 40-50%  proximal stenosis and mid 75% stenosis), well-preserved ejection  fraction (50-55% )  . Dyslipidemia   . Obesity   . HTN (hypertension)   . Sleep apnea     . PUD (peptic ulcer disease)   . DM (diabetes mellitus)   . Prostate cancer     Past Surgical History  Procedure Date  . Cholecystectomy   . Cardiac catheterization     ROS:  As stated in the HPI and negative for all other systems.  PHYSICAL EXAM BP 173/68  Pulse 66  Resp 18  Ht 5\' 8"  (1.727 m)  Wt 228 lb (103.42 kg)  BMI 34.67 kg/m2 GENERAL:  Well appearing HEENT:  Pupils equal round and reactive, fundi not visualized, oral mucosa unremarkable NECK:  No jugular venous distention, waveform within normal limits, carotid upstroke brisk and symmetric, no bruits, no thyromegaly LYMPHATICS:  No cervical, inguinal adenopathy LUNGS:  Clear to auscultation bilaterally BACK:  No CVA tenderness CHEST:  Unremarkable HEART:  PMI not displaced or sustained,S1 and S2 within normal limits, no S3, no S4, no clicks, no rubs, no murmurs ABD:  Flat, positive bowel sounds normal in frequency in pitch, no bruits, no rebound, no guarding, no midline pulsatile mass, no hepatomegaly, no splenomegaly, obese EXT:  2 plus pulses throughout, no edema, no cyanosis no clubbing, left arm s/p trauma SKIN:  No rashes no  nodules NEURO:  Cranial nerves II through XII grossly intact, motor grossly intact throughout PSYCH:  Cognitively intact, oriented to person place and time  EKG:  Sinus rhythm, right bundle branch block, no acute ST-T wave changes  A/P

## 2010-11-25 ENCOUNTER — Other Ambulatory Visit: Payer: Self-pay | Admitting: Cardiology

## 2010-11-25 DIAGNOSIS — I6529 Occlusion and stenosis of unspecified carotid artery: Secondary | ICD-10-CM

## 2010-11-26 ENCOUNTER — Encounter (INDEPENDENT_AMBULATORY_CARE_PROVIDER_SITE_OTHER): Payer: Medicare Other | Admitting: *Deleted

## 2010-11-26 ENCOUNTER — Ambulatory Visit: Payer: Medicare Other | Admitting: Cardiology

## 2010-11-26 DIAGNOSIS — I6529 Occlusion and stenosis of unspecified carotid artery: Secondary | ICD-10-CM

## 2010-12-23 ENCOUNTER — Ambulatory Visit (INDEPENDENT_AMBULATORY_CARE_PROVIDER_SITE_OTHER): Payer: Medicare Other | Admitting: Cardiology

## 2010-12-23 ENCOUNTER — Encounter: Payer: Self-pay | Admitting: Cardiology

## 2010-12-23 DIAGNOSIS — E785 Hyperlipidemia, unspecified: Secondary | ICD-10-CM

## 2010-12-23 DIAGNOSIS — R0989 Other specified symptoms and signs involving the circulatory and respiratory systems: Secondary | ICD-10-CM

## 2010-12-23 DIAGNOSIS — I2510 Atherosclerotic heart disease of native coronary artery without angina pectoris: Secondary | ICD-10-CM

## 2010-12-23 DIAGNOSIS — F17200 Nicotine dependence, unspecified, uncomplicated: Secondary | ICD-10-CM

## 2010-12-23 DIAGNOSIS — I10 Essential (primary) hypertension: Secondary | ICD-10-CM

## 2010-12-23 NOTE — Assessment & Plan Note (Signed)
The blood pressure is at target. No change in medications is indicated. We will continue with therapeutic lifestyle changes (TLC).  

## 2010-12-23 NOTE — Progress Notes (Signed)
HPI    The patient presents for followup of his known coronary disease. Earlier this year he had stress perfusion imaging which demonstrated an EF of 61% with no evidence of high-grade ischemia. There was probable diaphragmatic attenuation. Since that time he has done relatively well. He's not active being limited by knee pain. However, with his activities of daily living he patient denies any new symptoms such as chest discomfort, neck or arm discomfort. There has been no new shortness of breath, PND or orthopnea. There have been no reported palpitations, presyncope or syncope.  He has required blood transfusions because of anemia recently. He has had a workup for this. At the last appointment I asked question of his primary physician whether he can continue on aspirin and Plavix and this was felt to be reasonable.  No Known Allergies  Current Outpatient Prescriptions  Medication Sig Dispense Refill  . cholestyramine light (PREVALITE) 4 G packet Take 4 g by mouth daily.        . clopidogrel (PLAVIX) 75 MG tablet Take 75 mg by mouth daily.        . metFORMIN (GLUCOPHAGE) 500 MG tablet Take 500 mg by mouth 3 (three) times daily.        . metoprolol succinate (TOPROL-XL) 25 MG 24 hr tablet Take 1 tablet (25 mg total) by mouth 2 (two) times daily.  60 tablet  11  . omeprazole (PRILOSEC) 20 MG capsule Take 20 mg by mouth daily.        . simvastatin (ZOCOR) 40 MG tablet Take 40 mg by mouth at bedtime.        Marland Kitchen telmisartan-hydrochlorothiazide (MICARDIS HCT) 80-12.5 MG per tablet Take 1 tablet by mouth daily.  30 tablet  11  . aspirin 81 MG tablet Take 81 mg by mouth daily.          Past Medical History  Diagnosis Date  . CAD (coronary artery disease)     (2005 LAD 30-40% stenosis  followed by 60-65% focal stenosis.  The second diagonal had 80%   stenosis.  The circumflex had 85-90% mid stenosis and was stented with a  Taxus stent.  The obtuse marginal had 20-25% stenosis and obtuse   marginal-2 had  40-50% stenosis.  The right coronary artery had 40-50%  proximal stenosis and mid 75% stenosis), well-preserved ejection  fraction (50-55% )  . Dyslipidemia   . Obesity   . HTN (hypertension)   . Sleep apnea   . PUD (peptic ulcer disease)   . DM (diabetes mellitus)   . Prostate cancer     Past Surgical History  Procedure Date  . Cholecystectomy   . Cardiac catheterization     ROS:  As stated in the HPI and negative for all other systems.  PHYSICAL EXAM BP 112/68  Pulse 57  Ht 5\' 7"  (1.702 m)  Wt 226 lb 1.9 oz (102.567 kg)  BMI 35.42 kg/m2 GENERAL:  Well appearing HEENT:  Pupils equal round and reactive, fundi not visualized, oral mucosa unremarkable NECK:  No jugular venous distention, waveform within normal limits, carotid upstroke brisk and symmetric, no bruits, no thyromegaly LYMPHATICS:  No cervical, inguinal adenopathy LUNGS:  Clear to auscultation bilaterally BACK:  No CVA tenderness CHEST:  Unremarkable HEART:  PMI not displaced or sustained,S1 and S2 within normal limits, no S3, no S4, no clicks, no rubs, no murmurs ABD:  Flat, positive bowel sounds normal in frequency in pitch, no bruits, no rebound, no guarding, no midline  pulsatile mass, no hepatomegaly, no splenomegaly, obese EXT:  2 plus pulses throughout, no edema, no cyanosis no clubbing, left arm s/p trauma SKIN:  No rashes no nodules NEURO:  Cranial nerves II through XII grossly intact, motor grossly intact throughout [facility]:  Cognitively intact, oriented to person place and time  EKG:  Sinus rhythm, right bundle branch block, rate 57, no change from previous 12/23/2010   A/P

## 2010-12-23 NOTE — Assessment & Plan Note (Signed)
The patient has no new sypmtoms.  No further cardiovascular testing is indicated.  We will continue with aggressive risk reduction and meds as listed.  He had his last stress test 2012

## 2010-12-23 NOTE — Assessment & Plan Note (Signed)
This is followed by his primary provider. I would think the goal should be an LDL less than 70 and HDL greater than 40

## 2010-12-23 NOTE — Assessment & Plan Note (Signed)
We have talked at length about stopping smoking and he doesn't think he can do this.

## 2010-12-23 NOTE — Assessment & Plan Note (Signed)
He is up-to-date with carotid Dopplers though stenosis on the has 60-79% stenosis on the right and less than this on the left.  He understands the importance of following up in May to have this restudied as he is approaching the need for carotid endarterectomy. He understands the importance of risk reduction.

## 2010-12-23 NOTE — Progress Notes (Deleted)
HPI The patient presents for follow up of chest pain.  He had this recently with exertion.  It was across his chest . It was not at rest. It would go away when he stopped his activities. It happened for about 3-4 weeks. He was not describing associated nausea vomiting or diaphoresis. Wasn't describing palpitations, presyncope or syncope. There was no new shortness of breath, PND or orthopnea. He was subsequently found to have anemia. He received 2 units of blood transfusion and his symptoms have abated. Of note earlier this year he did have a stress perfusion study with some mild inferior ischemia.  We decided at that time to manage this medically.  No Known Allergies  Current Outpatient Prescriptions  Medication Sig Dispense Refill  . cholestyramine light (PREVALITE) 4 G packet Take 4 g by mouth daily.        . clopidogrel (PLAVIX) 75 MG tablet Take 75 mg by mouth daily.        . metFORMIN (GLUCOPHAGE) 500 MG tablet Take 500 mg by mouth 3 (three) times daily.        . metoprolol succinate (TOPROL-XL) 25 MG 24 hr tablet Take 1 tablet (25 mg total) by mouth 2 (two) times daily.  60 tablet  11  . omeprazole (PRILOSEC) 20 MG capsule Take 20 mg by mouth daily.        . simvastatin (ZOCOR) 40 MG tablet Take 40 mg by mouth at bedtime.        Marland Kitchen telmisartan-hydrochlorothiazide (MICARDIS HCT) 80-12.5 MG per tablet Take 1 tablet by mouth daily.  30 tablet  11  . aspirin 81 MG tablet Take 81 mg by mouth daily.          Past Medical History  Diagnosis Date  . CAD (coronary artery disease)     (2005 LAD 30-40% stenosis  followed by 60-65% focal stenosis.  The second diagonal had 80%   stenosis.  The circumflex had 85-90% mid stenosis and was stented with a  Taxus stent.  The obtuse marginal had 20-25% stenosis and obtuse   marginal-2 had 40-50% stenosis.  The right coronary artery had 40-50%  proximal stenosis and mid 75% stenosis), well-preserved ejection  fraction (50-55% )  . Dyslipidemia   . Obesity    . HTN (hypertension)   . Sleep apnea   . PUD (peptic ulcer disease)   . DM (diabetes mellitus)   . Prostate cancer     Past Surgical History  Procedure Date  . Cholecystectomy   . Cardiac catheterization    ROS:  As stated in the HPI and negative for all other systems.  PHYSICAL EXAM BP 112/68  Pulse 57  Ht 5\' 7"  (1.702 m)  Wt 226 lb 1.9 oz (102.567 kg)  BMI 35.42 kg/m2 GENERAL:  Well appearing HEENT:  Pupils equal round and reactive, fundi not visualized, oral mucosa unremarkable NECK:  No jugular venous distention, waveform within normal limits, carotid upstroke brisk and symmetric, no bruits, no thyromegaly LYMPHATICS:  No cervical, inguinal adenopathy LUNGS:  Clear to auscultation bilaterally BACK:  No CVA tenderness CHEST:  Unremarkable HEART:  PMI not displaced or sustained,S1 and S2 within normal limits, no S3, no S4, no clicks, no rubs, no murmurs ABD:  Flat, positive bowel sounds normal in frequency in pitch, no bruits, no rebound, no guarding, no midline pulsatile mass, no hepatomegaly, no splenomegaly, obese EXT:  2 plus pulses throughout, no edema, no cyanosis no clubbing, left arm s/p trauma SKIN:  No rashes no nodules NEURO:  Cranial nerves II through XII grossly intact, motor grossly intact throughout PSYCH:  Cognitively intact, oriented to person place and time  EKG:  Sinus rhythm, right bundle branch block, no acute ST-T wave changes.  HR  57.  12/23/2010  A/P

## 2010-12-23 NOTE — Patient Instructions (Signed)
Continue current medications as listed.  Follow up in 1 year with Dr Hochrein.  You will receive a letter in the mail 2 months before you are due.  Please call us when you receive this letter to schedule your follow up appointment.  

## 2011-01-02 ENCOUNTER — Inpatient Hospital Stay (HOSPITAL_COMMUNITY)
Admission: EM | Admit: 2011-01-02 | Discharge: 2011-01-07 | DRG: 194 | Disposition: A | Discharge status: Home Health | Payer: Medicare Other | Attending: Internal Medicine | Admitting: Internal Medicine

## 2011-01-02 ENCOUNTER — Emergency Department (HOSPITAL_COMMUNITY): Payer: Medicare Other

## 2011-01-02 ENCOUNTER — Encounter (HOSPITAL_COMMUNITY): Payer: Self-pay | Admitting: Emergency Medicine

## 2011-01-02 ENCOUNTER — Other Ambulatory Visit: Payer: Self-pay

## 2011-01-02 DIAGNOSIS — E669 Obesity, unspecified: Secondary | ICD-10-CM | POA: Diagnosis present

## 2011-01-02 DIAGNOSIS — R0603 Acute respiratory distress: Secondary | ICD-10-CM | POA: Diagnosis present

## 2011-01-02 DIAGNOSIS — E1165 Type 2 diabetes mellitus with hyperglycemia: Secondary | ICD-10-CM | POA: Diagnosis present

## 2011-01-02 DIAGNOSIS — R0602 Shortness of breath: Secondary | ICD-10-CM | POA: Diagnosis not present

## 2011-01-02 DIAGNOSIS — I252 Old myocardial infarction: Secondary | ICD-10-CM

## 2011-01-02 DIAGNOSIS — D649 Anemia, unspecified: Secondary | ICD-10-CM | POA: Diagnosis present

## 2011-01-02 DIAGNOSIS — I10 Essential (primary) hypertension: Secondary | ICD-10-CM | POA: Diagnosis present

## 2011-01-02 DIAGNOSIS — F17200 Nicotine dependence, unspecified, uncomplicated: Secondary | ICD-10-CM | POA: Diagnosis present

## 2011-01-02 DIAGNOSIS — E538 Deficiency of other specified B group vitamins: Secondary | ICD-10-CM | POA: Diagnosis present

## 2011-01-02 DIAGNOSIS — R748 Abnormal levels of other serum enzymes: Secondary | ICD-10-CM

## 2011-01-02 DIAGNOSIS — N179 Acute kidney failure, unspecified: Secondary | ICD-10-CM | POA: Diagnosis present

## 2011-01-02 DIAGNOSIS — J189 Pneumonia, unspecified organism: Secondary | ICD-10-CM

## 2011-01-02 DIAGNOSIS — I509 Heart failure, unspecified: Secondary | ICD-10-CM | POA: Diagnosis not present

## 2011-01-02 DIAGNOSIS — R55 Syncope and collapse: Secondary | ICD-10-CM

## 2011-01-02 DIAGNOSIS — R778 Other specified abnormalities of plasma proteins: Secondary | ICD-10-CM

## 2011-01-02 DIAGNOSIS — E1129 Type 2 diabetes mellitus with other diabetic kidney complication: Secondary | ICD-10-CM | POA: Diagnosis not present

## 2011-01-02 DIAGNOSIS — G4730 Sleep apnea, unspecified: Secondary | ICD-10-CM | POA: Diagnosis present

## 2011-01-02 DIAGNOSIS — G4733 Obstructive sleep apnea (adult) (pediatric): Secondary | ICD-10-CM | POA: Diagnosis present

## 2011-01-02 DIAGNOSIS — J441 Chronic obstructive pulmonary disease with (acute) exacerbation: Secondary | ICD-10-CM | POA: Diagnosis present

## 2011-01-02 DIAGNOSIS — I4891 Unspecified atrial fibrillation: Secondary | ICD-10-CM | POA: Diagnosis not present

## 2011-01-02 DIAGNOSIS — Z720 Tobacco use: Secondary | ICD-10-CM | POA: Diagnosis present

## 2011-01-02 DIAGNOSIS — I2510 Atherosclerotic heart disease of native coronary artery without angina pectoris: Secondary | ICD-10-CM | POA: Diagnosis present

## 2011-01-02 DIAGNOSIS — J449 Chronic obstructive pulmonary disease, unspecified: Secondary | ICD-10-CM | POA: Diagnosis present

## 2011-01-02 DIAGNOSIS — N19 Unspecified kidney failure: Secondary | ICD-10-CM

## 2011-01-02 DIAGNOSIS — E119 Type 2 diabetes mellitus without complications: Secondary | ICD-10-CM | POA: Diagnosis present

## 2011-01-02 DIAGNOSIS — R509 Fever, unspecified: Secondary | ICD-10-CM

## 2011-01-02 DIAGNOSIS — R9431 Abnormal electrocardiogram [ECG] [EKG]: Secondary | ICD-10-CM

## 2011-01-02 HISTORY — DX: Diverticulosis of large intestine without perforation or abscess without bleeding: K57.30

## 2011-01-02 HISTORY — DX: Personal history of colon polyps, unspecified: Z86.0100

## 2011-01-02 HISTORY — DX: Bronchitis, not specified as acute or chronic: J40

## 2011-01-02 HISTORY — DX: Radiation proctitis: K62.7

## 2011-01-02 HISTORY — DX: Personal history of colonic polyps: Z86.010

## 2011-01-02 HISTORY — DX: Occlusion and stenosis of unspecified carotid artery: I65.29

## 2011-01-02 HISTORY — DX: Acute myocardial infarction, unspecified: I21.9

## 2011-01-02 LAB — LACTIC ACID, PLASMA: Lactic Acid, Venous: 2.4 mmol/L — ABNORMAL HIGH (ref 0.5–2.2)

## 2011-01-02 LAB — BASIC METABOLIC PANEL
BUN: 29 mg/dL — ABNORMAL HIGH (ref 6–23)
CO2: 18 mEq/L — ABNORMAL LOW (ref 19–32)
Calcium: 8.7 mg/dL (ref 8.4–10.5)
Chloride: 103 mEq/L (ref 96–112)
Creatinine, Ser: 2.11 mg/dL — ABNORMAL HIGH (ref 0.50–1.35)
GFR calc Af Amer: 33 mL/min — ABNORMAL LOW (ref >90)
GFR calc non Af Amer: 29 mL/min — ABNORMAL LOW (ref >90)
Glucose, Bld: 282 mg/dL — ABNORMAL HIGH (ref 70–99)
Potassium: 3.9 mEq/L (ref 3.5–5.1)
Sodium: 136 mEq/L (ref 135–145)

## 2011-01-02 LAB — POCT I-STAT TROPONIN I: Troponin i, poc: 0.13 ng/mL (ref 0.00–0.08)

## 2011-01-02 LAB — INFLUENZA PANEL BY PCR (TYPE A & B)
H1N1 flu by pcr: NOT DETECTED
Influenza B By PCR: NEGATIVE

## 2011-01-02 LAB — URINALYSIS, ROUTINE W REFLEX MICROSCOPIC
Glucose, UA: 100 mg/dL — AB
Leukocytes, UA: NEGATIVE
Nitrite: NEGATIVE
Protein, ur: >300 mg/dL — AB
Specific Gravity, Urine: 1.025 (ref 1.005–1.030)
Urobilinogen, UA: 0.2 mg/dL (ref 0.0–1.0)
pH: 5 (ref 5.0–8.0)

## 2011-01-02 LAB — CARDIAC PANEL(CRET KIN+CKTOT+MB+TROPI)
Relative Index: 0.6 (ref 0.0–2.5)
Relative Index: 0.5 (ref 0.0–2.5)
Troponin I: <0.3 ng/mL (ref <0.30)

## 2011-01-02 LAB — URINE MICROSCOPIC-ADD ON

## 2011-01-02 LAB — CBC
HCT: 26.3 % — ABNORMAL LOW (ref 39.0–52.0)
Hemoglobin: 8.2 g/dL — ABNORMAL LOW (ref 13.0–17.0)
MCH: 24.4 pg — ABNORMAL LOW (ref 26.0–34.0)
MCHC: 31.2 g/dL (ref 30.0–36.0)
MCV: 78.3 fL (ref 78.0–100.0)
Platelets: 168 10*3/uL (ref 150–400)
RBC: 3.36 MIL/uL — ABNORMAL LOW (ref 4.22–5.81)
RDW: 16 % — ABNORMAL HIGH (ref 11.5–15.5)
WBC: 9.3 10*3/uL (ref 4.0–10.5)

## 2011-01-02 LAB — EXPECTORATED SPUTUM ASSESSMENT W GRAM STAIN, RFLX TO RESP C

## 2011-01-02 LAB — GLUCOSE, CAPILLARY: Glucose-Capillary: 247 mg/dL — ABNORMAL HIGH (ref 70–99)

## 2011-01-02 MED ORDER — CHOLESTYRAMINE LIGHT 4 G PO PACK
4.0000 g | PACK | Freq: Every day | ORAL | Status: DC
  Administered 2011-01-02 – 2011-01-07 (×6): 4 g via ORAL
  Filled 2011-01-02 (×7): qty 1

## 2011-01-02 MED ORDER — PANTOPRAZOLE SODIUM 40 MG PO TBEC
40.0000 mg | DELAYED_RELEASE_TABLET | Freq: Every day | ORAL | Status: DC
  Administered 2011-01-02 – 2011-01-07 (×6): 40 mg via ORAL
  Filled 2011-01-02 (×7): qty 1

## 2011-01-02 MED ORDER — ACETAMINOPHEN 325 MG PO TABS
650.0000 mg | ORAL_TABLET | Freq: Once | ORAL | Status: AC
  Administered 2011-01-02: 650 mg via ORAL
  Filled 2011-01-02: qty 2

## 2011-01-02 MED ORDER — LEVALBUTEROL HCL 0.63 MG/3ML IN NEBU
0.6300 mg | INHALATION_SOLUTION | RESPIRATORY_TRACT | Status: DC | PRN
  Administered 2011-01-02 – 2011-01-06 (×5): 0.63 mg via RESPIRATORY_TRACT
  Filled 2011-01-02 (×2): qty 3

## 2011-01-02 MED ORDER — IPRATROPIUM BROMIDE 0.02 % IN SOLN
0.5000 mg | Freq: Four times a day (QID) | RESPIRATORY_TRACT | Status: DC
  Administered 2011-01-02 – 2011-01-04 (×8): 0.5 mg via RESPIRATORY_TRACT
  Filled 2011-01-02 (×10): qty 2.5

## 2011-01-02 MED ORDER — DEXTROSE 5 % IV SOLN
1.0000 g | Freq: Once | INTRAVENOUS | Status: AC
  Administered 2011-01-02: 1 g via INTRAVENOUS
  Filled 2011-01-02: qty 10

## 2011-01-02 MED ORDER — SODIUM CHLORIDE 0.9 % IV SOLN
INTRAVENOUS | Status: DC
  Administered 2011-01-02: 19:00:00 via INTRAVENOUS
  Administered 2011-01-03: 50 mL/h via INTRAVENOUS

## 2011-01-02 MED ORDER — SODIUM CHLORIDE 0.9 % IV BOLUS (SEPSIS): 1000.0000 mL | Freq: Once | INTRAVENOUS | Status: DC

## 2011-01-02 MED ORDER — GUAIFENESIN ER 600 MG PO TB12
600.0000 mg | ORAL_TABLET | Freq: Two times a day (BID) | ORAL | Status: DC
  Administered 2011-01-02 – 2011-01-07 (×11): 600 mg via ORAL
  Filled 2011-01-02 (×13): qty 1

## 2011-01-02 MED ORDER — SODIUM CHLORIDE 0.9 % IV BOLUS (SEPSIS)
1000.0000 mL | Freq: Once | INTRAVENOUS | Status: AC
  Administered 2011-01-02: 1000 mL via INTRAVENOUS

## 2011-01-02 MED ORDER — CLOPIDOGREL BISULFATE 75 MG PO TABS
75.0000 mg | ORAL_TABLET | Freq: Every day | ORAL | Status: DC
  Administered 2011-01-03 – 2011-01-07 (×5): 75 mg via ORAL
  Filled 2011-01-02 (×5): qty 1

## 2011-01-02 MED ORDER — METOPROLOL SUCCINATE ER 25 MG PO TB24
25.0000 mg | ORAL_TABLET | Freq: Two times a day (BID) | ORAL | Status: DC
  Administered 2011-01-02 – 2011-01-07 (×9): 25 mg via ORAL
  Filled 2011-01-02 (×12): qty 1

## 2011-01-02 MED ORDER — ASPIRIN 81 MG PO CHEW
324.0000 mg | CHEWABLE_TABLET | Freq: Once | ORAL | Status: AC
  Administered 2011-01-02: 324 mg via ORAL
  Filled 2011-01-02: qty 4

## 2011-01-02 MED ORDER — INSULIN ASPART 100 UNIT/ML ~~LOC~~ SOLN
0.0000 [IU] | Freq: Three times a day (TID) | SUBCUTANEOUS | Status: DC
  Administered 2011-01-03: 3 [IU] via SUBCUTANEOUS
  Administered 2011-01-03: 2 [IU] via SUBCUTANEOUS
  Administered 2011-01-04: 3 [IU] via SUBCUTANEOUS
  Administered 2011-01-04: 2 [IU] via SUBCUTANEOUS
  Administered 2011-01-04 – 2011-01-06 (×6): 3 [IU] via SUBCUTANEOUS
  Administered 2011-01-06 – 2011-01-07 (×2): 2 [IU] via SUBCUTANEOUS
  Administered 2011-01-07: 3 [IU] via SUBCUTANEOUS

## 2011-01-02 MED ORDER — ASPIRIN 81 MG PO CHEW
81.0000 mg | CHEWABLE_TABLET | Freq: Every day | ORAL | Status: DC
  Administered 2011-01-02 – 2011-01-06 (×5): 81 mg via ORAL
  Filled 2011-01-02 (×7): qty 1

## 2011-01-02 MED ORDER — DEXTROSE 5 % IV SOLN
500.0000 mg | INTRAVENOUS | Status: DC
  Administered 2011-01-03 – 2011-01-05 (×3): 500 mg via INTRAVENOUS
  Filled 2011-01-02 (×4): qty 500

## 2011-01-02 MED ORDER — SIMVASTATIN 40 MG PO TABS
40.0000 mg | ORAL_TABLET | Freq: Every day | ORAL | Status: DC
  Administered 2011-01-02 – 2011-01-06 (×5): 40 mg via ORAL
  Filled 2011-01-02 (×6): qty 1

## 2011-01-02 MED ORDER — ACETAMINOPHEN 325 MG PO TABS: 650.0000 mg | ORAL_TABLET | Freq: Four times a day (QID) | ORAL | Status: DC | PRN

## 2011-01-02 MED ORDER — DEXTROSE 5 % IV SOLN
1.0000 g | INTRAVENOUS | Status: DC
  Administered 2011-01-03 – 2011-01-07 (×5): 1 g via INTRAVENOUS
  Filled 2011-01-02 (×5): qty 10

## 2011-01-02 MED ORDER — INSULIN ASPART 100 UNIT/ML ~~LOC~~ SOLN
0.0000 [IU] | Freq: Every day | SUBCUTANEOUS | Status: DC
  Administered 2011-01-02 – 2011-01-04 (×2): 2 [IU] via SUBCUTANEOUS
  Filled 2011-01-02 (×2): qty 3

## 2011-01-02 MED ORDER — ACETAMINOPHEN 500 MG PO TABS
1000.0000 mg | ORAL_TABLET | Freq: Once | ORAL | Status: AC
  Administered 2011-01-02: 1000 mg via ORAL
  Filled 2011-01-02: qty 2

## 2011-01-02 MED ORDER — DEXTROSE 5 % IV SOLN
500.0000 mg | Freq: Once | INTRAVENOUS | Status: AC
  Administered 2011-01-02: 500 mg via INTRAVENOUS
  Filled 2011-01-02: qty 500

## 2011-01-02 MED ORDER — ALBUTEROL SULFATE (5 MG/ML) 0.5% IN NEBU
INHALATION_SOLUTION | RESPIRATORY_TRACT | Status: AC
  Administered 2011-01-02: 09:00:00
  Filled 2011-01-02: qty 0.5

## 2011-01-02 MED ORDER — GUAIFENESIN 100 MG/5ML PO SOLN
200.0000 mg | Freq: Three times a day (TID) | ORAL | Status: DC | PRN
Start: 2011-01-02 — End: 2011-01-07

## 2011-01-02 NOTE — ED Notes (Signed)
Attempted to call report, was told nurse will call me back.

## 2011-01-02 NOTE — ED Notes (Signed)
Pt back from x-ray.

## 2011-01-02 NOTE — H&P (Signed)
PCP:   Richardean Sale, MD, MD - pumonologist/ PCP Dr. Antoine Poche - [facility]  Chief Complaint:  Cough, confused, fall.   HPI: [name] [name] is a 77 y.o. male   has a past medical history of CAD (coronary artery disease); Obesity; HTN (hypertension); Sleep apnea; PUD (peptic ulcer disease); DM (diabetes mellitus); Prostate cancer (2001); Carotid stenosis; Sleep apnea; History of colonic polyps; Sigmoid diverticulosis; Radiation proctitis; Bronchitis; and Myocardial infarction.   Presented with  Cough for 2 days productive of white/ellowish mucouse, fever 102. This am he has lost his balance and fallen backwards but did not lose consciences. Did not hit his head. He have had worsening shortness of breath for past few days with increased wheezing.  He reports prickling sharp chest pain on the left in a small spot but no radiation to arm no chest pressure. He continues to smoke.  Review of Systems:    Pertinent Positives: mild CP, some SOB, mild diarrhea -chronic, chills and fever  Pertinent Negatives: NO syncope, NO N/V, no localized neurological complaints Otherwise ROS are negative except for above, 10 systems were reviewed  Past Medical History: Past Medical History  Diagnosis Date  . CAD (coronary artery disease)     (2005 LAD 30-40% stenosis  followed by 60-65% focal stenosis.  The second diagonal had 80%   stenosis.  The circumflex had 85-90% mid stenosis and was stented with a  Taxus stent.  The obtuse marginal had 20-25% stenosis and obtuse   marginal-2 had 40-50% stenosis.  The right coronary artery had 40-50%  proximal stenosis and mid 75% stenosis), well-preserved ejection  fraction (50-55% )  . Dyslipidemia   . Obesity   . HTN (hypertension)   . Sleep apnea   . PUD (peptic ulcer disease)   . DM (diabetes mellitus)   . Prostate cancer    Past Surgical History  Procedure Date  . Cholecystectomy   . Cardiac catheterization      Medications: Prior to Admission  medications   Medication Sig Start Date End Date Taking? Authorizing Provider  aspirin 81 MG tablet Take 81 mg by mouth daily.     Yes Historical Provider, MD  cholestyramine light (PREVALITE) 4 G packet Take 4 g by mouth daily.     Yes Historical Provider, MD  clopidogrel (PLAVIX) 75 MG tablet Take 75 mg by mouth daily.     Yes Historical Provider, MD  guaiFENesin (ROBITUSSIN) 100 MG/5ML liquid Take 200 mg by mouth 3 (three) times daily as needed.     Yes Historical Provider, MD  metFORMIN (GLUCOPHAGE) 500 MG tablet Take 500 mg by mouth 3 (three) times daily.     Yes Historical Provider, MD  metoprolol succinate (TOPROL-XL) 25 MG 24 hr tablet Take 25 mg by mouth 2 (two) times daily.   09/12/10  Yes [name], MD  omeprazole (PRILOSEC) 20 MG capsule Take 20 mg by mouth daily.     Yes Historical Provider, MD  simvastatin (ZOCOR) 40 MG tablet Take 40 mg by mouth at bedtime.     Yes Historical Provider, MD  telmisartan-hydrochlorothiazide (MICARDIS HCT) 80-12.5 MG per tablet Take 1 tablet by mouth daily. 05/02/10 05/02/11 Yes [name], MD    Allergies:  No Known Allergies  Social History:  reports that he has been smoking Cigarettes.  He has a 60 pack-year smoking history. He has never used smokeless tobacco. He reports that he does not drink alcohol or use illicit drugs.   Family History: family  history includes Cancer in his father; Coronary artery disease (age of onset:65) in his father; Coronary artery disease (age of onset:75) in his brother; Coronary artery disease (age of onset:90) in his sister; and Stroke (age of onset:96) in his mother.    Physical Exam: Patient Vitals for the past 24 hrs:  BP Temp Temp src Pulse Resp SpO2 Weight  01/02/11 1547 153/98 mmHg - - - 28  94 % -  01/02/11 1523 - 102.2 F (39 C) Oral - 30  - -  01/02/11 1245 115/57 mmHg 99.5 F (37.5 C) Oral - 22  96 % -  01/02/11 0852 110/86 mmHg 102.4 F (39.1 C) Oral 98  18  95 % 106.595 kg (235 lb)    01/02/11 0841 - - - - - 98 % -    1. General:  in No Acute distress 2. Psychological: Alert and Oriented 3. Head/ENT:    Dry Mucous Membranes                          Head Non traumatic, neck supple                          Normal  Dentition 4. SKIN: normal  Skin turgor,  Skin clean Dry and intact no rash 5. Heart: Regular rate and rhythm no Murmur, Rub or gallop 6. Lungs: Wheezes bilateraly and decreased air movement 7. Abdomen: Soft, non-tender, Non distended 8. Lower extremities: no clubbing, cyanosis, or edema 9. Neurologically Grossly intact, moving all 4 extremities equally 10. MSK: Normal range of motion except Left arm that has been injured previously  body mass index is unknown because there is no height on file.   Labs on Admission:   Basename 01/02/11 1020  NA 136  K 3.9  CL 103  CO2 18*  GLUCOSE 282*  BUN 29*  CREATININE 2.11*  CALCIUM 8.7  MG --  PHOS --   No results found for this basename: AST:2,ALT:2,ALKPHOS:2,BILITOT:2,PROT:2,ALBUMIN:2 in the last 72 hours No results found for this basename: LIPASE:2,AMYLASE:2 in the last 72 hours  Basename 01/02/11 1020  WBC 9.3  NEUTROABS --  HGB 8.2*  HCT 26.3*  MCV 78.3  PLT 168    Basename 01/02/11 1350  CKTOTAL 889*  CKMB 5.5*  CKMBINDEX --  TROPONINI <0.30   No results found for this basename: TSH,T4TOTAL,FREET3,T3FREE,THYROIDAB in the last 72 hours No results found for this basename: VITAMINB12:2,FOLATE:2,FERRITIN:2,TIBC:2,IRON:2,RETICCTPCT:2 in the last 72 hours No results found for this basename: HGBA1C    The CrCl is unknown because both a height and weight (above a minimum accepted value) are required for this calculation. ABG No results found for this basename: phart,  pco2,  po2,  hco3,  tco2,  acidbasedef,  o2sat     No results found for this basename: DDIMER     Other results:  I have pearsonaly reviewed this: ECG REPORT  Rate: 95  Rhythm: sinus RBBB ST&T Change: ? Lead V4  UA  Protein, ur >300 but no evidence of infection  Lactic Acid, Venous 2.4   Blood Culture No results found for this basename: sdes, specrequest, cult, reptstatus       Radiological Exams on Admission: Dg Chest 2 View  01/02/2011  *RADIOLOGY REPORT*  Clinical Data: Lightheaded.  Shortness of breath.  CHEST - 2 VIEW  Comparison: Two-view chest radiograph 12/13/2003 at [facility].  Findings: Ill-defined airspace disease is present  within the lingula. The heart is mildly enlarged.  Mild pulmonary vascular congestion is evident.  There are no significant effusions.  Mild degenerative changes are again noted in the thoracic spine.  IMPRESSION:  1.  Ill-defined airspace opacification of the lingula.  This likely represents atelectasis or early infection.  Recommend follow-up chest radiograph after appropriate therapy. 2.  Cardiomegaly and mild pulmonary vascular congestion without frank edema.  Original Report Authenticated By: Jamesetta Orleans. MATTERN, M.D.    Assessment/Plan  Present on Admission:  Pneumonia -  - will admit for treatment of CAP will start on appropriate antibiotic coverage.   Obtain sputum cultures, blood cultures .  Provide oxygen as needed.     .Acute respiratory distress - likely secondary to pneumonia and copd will check abg if decompensates. Watch him in step down for now. .DM - SSI hole metformin .HYPERTENSION - Continue home meds .CAD - Appreciate cardiology consult, given chest pain and risk factors will admit, monitor on telemetry, cycle cardiac enzymes, obtain serial ECG.  Make sure patient is on Aspirin. Further treatment based on the currently pending results.   He is on aspirin and plavix .SLEEP APNEA - continue home CPAP  .Anemia - has been worked up in the past by [facility] [employer] [facility] will check anemia panel for now. .Acute renal failure - HOLD ARB, give IV fluids and monitor if no improvement would do further work up including  [place]  .Elevated troponin - will follow  for now, appreciate cardiology consult at this point no indication for anticoagulation especially given anemia. Chest pain seems atypical. If hg continues to decline or if he develops chest pain that is worrisome would transfuse  .COPD (chronic obstructive pulmonary disease) - xopenex, atrovent and mucinex, would let pulmonary know he is here. Given pneumonia would hold off on steroids. He continues to smoke.  Prophylaxis: protonix, scd  CODE STATUS: FULL CODE   Sharnee Douglass 01/02/2011, 2:01 PM

## 2011-01-02 NOTE — ED Notes (Signed)
ZOX:[id number]<AV> Expected date:<BR> Expected time:<BR> Means of arrival:<BR> Comments:<BR> Pt still in room waiting to go upstairs

## 2011-01-02 NOTE — ED Provider Notes (Signed)
History    78yM with dizziness. Describes as sensation of lightheadedness like may pass out. Has not been feeling well for almost a week but worse today. Subjective fever. Cough. No CP or SOB. Feel generally tired and not well. No sick contacts. No unusual leg pain or swelling. No urinary complaints. Smoker.  CSN: [id number]  Arrival date & time 01/02/11  7829   First MD Initiated Contact with Patient 01/02/11 0940      Chief Complaint  Patient presents with  . Dizziness    (Consider location/radiation/quality/duration/timing/severity/associated sxs/prior treatment) HPI  Past Medical History  Diagnosis Date  . CAD (coronary artery disease)     (2005 LAD 30-40% stenosis  followed by 60-65% focal stenosis.  The second diagonal had 80%   stenosis.  The circumflex had 85-90% mid stenosis and was stented with a  Taxus stent.  The obtuse marginal had 20-25% stenosis and obtuse   marginal-2 had 40-50% stenosis.  The right coronary artery had 40-50%  proximal stenosis and mid 75% stenosis), well-preserved ejection  fraction (50-55% )  . Dyslipidemia   . Obesity   . HTN (hypertension)   . Sleep apnea   . PUD (peptic ulcer disease)   . DM (diabetes mellitus)   . Prostate cancer     Past Surgical History  Procedure Date  . Cholecystectomy   . Cardiac catheterization     No family history on file.  History  Substance Use Topics  . Smoking status: Current Everyday Smoker -- 1.0 packs/day    Types: Cigarettes  . Smokeless tobacco: Not on file  . Alcohol Use: Not on file      Review of Systems   Review of symptoms negative unless otherwise noted in HPI.   Allergies  Review of patient's allergies indicates no known allergies.  Home Medications   Current Outpatient Rx  Name Route Sig Dispense Refill  . ASPIRIN 81 MG PO TABS Oral Take 81 mg by mouth daily.      . CHOLESTYRAMINE LIGHT 4 G PO PACK Oral Take 4 g by mouth daily.      Marland Kitchen CLOPIDOGREL BISULFATE 75 MG PO TABS  Oral Take 75 mg by mouth daily.      . GUAIFENESIN 100 MG/5ML PO LIQD Oral Take 200 mg by mouth 3 (three) times daily as needed.      Marland Kitchen METFORMIN HCL 500 MG PO TABS Oral Take 500 mg by mouth 3 (three) times daily.      Marland Kitchen METOPROLOL SUCCINATE ER 25 MG PO TB24 Oral Take 25 mg by mouth 2 (two) times daily.      Marland Kitchen OMEPRAZOLE 20 MG PO CPDR Oral Take 20 mg by mouth daily.      Marland Kitchen SIMVASTATIN 40 MG PO TABS Oral Take 40 mg by mouth at bedtime.      . TELMISARTAN-HCTZ 80-12.5 MG PO TABS Oral Take 1 tablet by mouth daily. 30 tablet 11    BP 110/86  Pulse 98  Temp(Src) 102.4 F (39.1 C) (Oral)  Resp 18  Wt 235 lb (106.595 kg)  SpO2 95%  Physical Exam  Nursing note and vitals reviewed. Constitutional: He appears well-developed and well-nourished. No distress.  HENT:  Head: Normocephalic and atraumatic.  Eyes: Conjunctivae are normal. Right eye exhibits no discharge. Left eye exhibits no discharge.  Neck: Normal range of motion. Neck supple.  Cardiovascular: Regular rhythm and normal heart sounds.  Exam reveals no gallop and no friction rub.   No murmur heard.  tachycardic  Pulmonary/Chest: Effort normal and breath sounds normal. No respiratory distress.  Abdominal: Soft. He exhibits no distension. There is no tenderness.  Musculoskeletal: He exhibits no edema and no tenderness.  Lymphadenopathy:    He has no cervical adenopathy.  Neurological: He is alert.  Skin: Skin is warm and dry.  Psychiatric: He has a normal mood and affect. His behavior is normal. Thought content normal.    ED Course  Procedures (including critical care time)  Labs Reviewed  CBC - Abnormal; Notable for the following:    RBC 3.36 (*)    Hemoglobin 8.2 (*)    HCT 26.3 (*)    MCH 24.4 (*)    RDW 16.0 (*)    All other components within normal limits  BASIC METABOLIC PANEL - Abnormal; Notable for the following:    CO2 18 (*)    Glucose, Bld 282 (*)    BUN 29 (*)    Creatinine, Ser 2.11 (*)    GFR calc  non Af Amer 29 (*)    GFR calc Af Amer 33 (*)    All other components within normal limits  URINALYSIS, ROUTINE W REFLEX MICROSCOPIC - Abnormal; Notable for the following:    Color, Urine AMBER (*) BIOCHEMICALS MAY BE AFFECTED BY COLOR   APPearance TURBID (*)    Glucose, UA 100 (*)    Hgb urine dipstick LARGE (*)    Bilirubin Urine SMALL (*)    Ketones, ur TRACE (*)    Protein, ur >300 (*)    All other components within normal limits  POCT I-STAT TROPONIN I - Abnormal; Notable for the following:    Troponin i, poc 0.13 (*)    All other components within normal limits  LACTIC ACID, PLASMA - Abnormal; Notable for the following:    Lactic Acid, Venous 2.4 (*)    All other components within normal limits  CARDIAC PANEL(CRET KIN+CKTOT+MB+TROPI) - Abnormal; Notable for the following:    Total CK 889 (*)    CK, MB 5.5 (*)    All other components within normal limits  CARDIAC PANEL(CRET KIN+CKTOT+MB+TROPI) - Abnormal; Notable for the following:    Total CK 1121 (*)    CK, MB 5.2 (*)    All other components within normal limits  CARDIAC PANEL(CRET KIN+CKTOT+MB+TROPI) - Abnormal; Notable for the following:    Total CK 1031 (*)    CK, MB 5.5 (*)    All other components within normal limits  URINE MICROSCOPIC-ADD ON - Abnormal; Notable for the following:    Casts GRANULAR CAST (*)    All other components within normal limits  HEMOGLOBIN A1C - Abnormal; Notable for the following:    Hemoglobin A1C 7.7 (*)    Mean Plasma Glucose 174 (*)    All other components within normal limits  IRON AND TIBC - Abnormal; Notable for the following:    Iron <10 (*)    All other components within normal limits  RETICULOCYTES - Abnormal; Notable for the following:    RBC. 3.33 (*)    All other components within normal limits  COMPREHENSIVE METABOLIC PANEL - Abnormal; Notable for the following:    Glucose, Bld 147 (*)    BUN 39 (*)    Creatinine, Ser 2.83 (*)    Albumin 2.7 (*)    GFR calc non Af Amer  20 (*)    GFR calc Af Amer 23 (*)    All other components within normal limits  CBC -  Abnormal; Notable for the following:    WBC 10.6 (*)    RBC 3.33 (*)    Hemoglobin 8.4 (*)    HCT 26.2 (*)    MCH 25.2 (*)    RDW 16.2 (*)    Platelets 138 (*)    All other components within normal limits  DIFFERENTIAL - Abnormal; Notable for the following:    Neutro Abs 8.0 (*)    All other components within normal limits  GLUCOSE, CAPILLARY - Abnormal; Notable for the following:    Glucose-Capillary 247 (*)    All other components within normal limits  GLUCOSE, CAPILLARY - Abnormal; Notable for the following:    Glucose-Capillary 225 (*)    All other components within normal limits  GLUCOSE, CAPILLARY - Abnormal; Notable for the following:    Glucose-Capillary 211 (*)    All other components within normal limits  GLUCOSE, CAPILLARY - Abnormal; Notable for the following:    Glucose-Capillary 132 (*)    All other components within normal limits  GLUCOSE, CAPILLARY - Abnormal; Notable for the following:    Glucose-Capillary 178 (*)    All other components within normal limits  CBC - Abnormal; Notable for the following:    WBC 12.0 (*)    RBC 3.69 (*)    Hemoglobin 8.9 (*)    HCT 29.4 (*)    MCH 24.1 (*)    RDW 16.3 (*)    All other components within normal limits  BASIC METABOLIC PANEL - Abnormal; Notable for the following:    Glucose, Bld 170 (*)    BUN 49 (*)    Creatinine, Ser 2.68 (*)    GFR calc non Af Amer 22 (*)    GFR calc Af Amer 25 (*)    All other components within normal limits  GLUCOSE, CAPILLARY - Abnormal; Notable for the following:    Glucose-Capillary 157 (*)    All other components within normal limits  GLUCOSE, CAPILLARY - Abnormal; Notable for the following:    Glucose-Capillary 141 (*)    All other components within normal limits  GLUCOSE, CAPILLARY - Abnormal; Notable for the following:    Glucose-Capillary 151 (*)    All other components within normal limits   GLUCOSE, CAPILLARY - Abnormal; Notable for the following:    Glucose-Capillary 200 (*)    All other components within normal limits  CBC - Abnormal; Notable for the following:    RBC 2.77 (*)    Hemoglobin 6.9 (*)    HCT 21.9 (*)    MCH 24.9 (*)    RDW 15.9 (*)    All other components within normal limits  COMPREHENSIVE METABOLIC PANEL - Abnormal; Notable for the following:    Glucose, Bld 162 (*)    BUN 48 (*)    Creatinine, Ser 2.16 (*)    Albumin 2.5 (*)    Total Bilirubin 0.2 (*)    GFR calc non Af Amer 28 (*)    GFR calc Af Amer 32 (*)    All other components within normal limits  GLUCOSE, CAPILLARY - Abnormal; Notable for the following:    Glucose-Capillary 129 (*)    All other components within normal limits  GLUCOSE, CAPILLARY - Abnormal; Notable for the following:    Glucose-Capillary 207 (*)    All other components within normal limits  GLUCOSE, CAPILLARY - Abnormal; Notable for the following:    Glucose-Capillary 161 (*)    All other components within normal limits  IRON  AND TIBC - Abnormal; Notable for the following:    Iron 10 (*)    Saturation Ratios 4 (*)    All other components within normal limits  RETICULOCYTES - Abnormal; Notable for the following:    RBC. 3.29 (*)    All other components within normal limits  GLUCOSE, CAPILLARY - Abnormal; Notable for the following:    Glucose-Capillary 165 (*)    All other components within normal limits  PROTEIN ELECTROPHORESIS, URINE - Abnormal; Notable for the following:    Free Kappa Lt Chains,Ur 42.80 (*)    Free Lambda Lt Chains,Ur 11.90 (*)    All other components within normal limits  GLUCOSE, CAPILLARY - Abnormal; Notable for the following:    Glucose-Capillary 192 (*)    All other components within normal limits  CBC - Abnormal; Notable for the following:    RBC 3.64 (*)    Hemoglobin 9.2 (*)    HCT 28.9 (*)    MCH 25.3 (*)    All other components within normal limits  COMPREHENSIVE METABOLIC PANEL -  Abnormal; Notable for the following:    Glucose, Bld 151 (*)    BUN 40 (*)    Creatinine, Ser 1.96 (*)    Albumin 2.4 (*)    Total Bilirubin 0.2 (*)    GFR calc non Af Amer 31 (*)    GFR calc Af Amer 36 (*)    All other components within normal limits  GLUCOSE, CAPILLARY - Abnormal; Notable for the following:    Glucose-Capillary 142 (*)    All other components within normal limits  GLUCOSE, CAPILLARY - Abnormal; Notable for the following:    Glucose-Capillary 160 (*)    All other components within normal limits  GLUCOSE, CAPILLARY - Abnormal; Notable for the following:    Glucose-Capillary 160 (*)    All other components within normal limits  CBC - Abnormal; Notable for the following:    RBC 3.71 (*)    Hemoglobin 9.6 (*)    HCT 29.6 (*)    MCH 25.9 (*)    All other components within normal limits  COMPREHENSIVE METABOLIC PANEL - Abnormal; Notable for the following:    Glucose, Bld 147 (*)    BUN 33 (*)    Creatinine, Ser 1.92 (*)    Albumin 2.6 (*)    Total Bilirubin 0.2 (*)    GFR calc non Af Amer 32 (*)    GFR calc Af Amer 37 (*)    All other components within normal limits  GLUCOSE, CAPILLARY - Abnormal; Notable for the following:    Glucose-Capillary 125 (*)    All other components within normal limits  GLUCOSE, CAPILLARY - Abnormal; Notable for the following:    Glucose-Capillary 140 (*)    All other components within normal limits  GLUCOSE, CAPILLARY - Abnormal; Notable for the following:    Glucose-Capillary 139 (*)    All other components within normal limits  GLUCOSE, CAPILLARY - Abnormal; Notable for the following:    Glucose-Capillary 153 (*)    All other components within normal limits  INFLUENZA PANEL BY PCR  CULTURE, BLOOD (ROUTINE X 2)  CULTURE, BLOOD (ROUTINE X 2)  MRSA PCR SCREENING  TYPE AND SCREEN  VITAMIN B12  FOLATE  FERRITIN  CULTURE, SPUTUM-ASSESSMENT  STREP PNEUMONIAE URINARY ANTIGEN  CULTURE, RESPIRATORY  SODIUM, URINE, RANDOM    CREATININE, URINE, RANDOM  DIFFERENTIAL  VITAMIN B12  FOLATE  FERRITIN  PREPARE RBC (CROSSMATCH)  OCCULT BLOOD  X 1 CARD TO LAB, STOOL  DIFFERENTIAL  DIFFERENTIAL  PROTEIN ELECTROPHORESIS, SERUM   No results found.  EKG:  Rhythm: normal sinus Rate:  95 Axis: normal Intervals: 1st degree av block. rbbb. ST segments: St depression inferiorly and laterally which is change from previous.  12:50 PM Discussed with cards. Will see pt in consult. Medicine paged for admit.  1. Pneumonia   2. Fever   3. EKG abnormalities   4. Elevated troponin   5. Syncope   6. Renal failure   7. Anemia   8. Coronary atherosclerosis of unspecified type of vessel, native or graft   9. DM type 2, uncontrolled, with renal complications       MDM  78yM with dizziness and general malaise. Possibly related to febrile illness but does have slightly elevated troponin. Admit for further tx and evaluation.        Raeford Razor, MD 01/07/11 2042

## 2011-01-02 NOTE — ED Notes (Signed)
Per ems, "pt states he has been dizzy when standing over the past couple of days, denies trauma from falls.  Also complaining of fever and productive cough for past week.

## 2011-01-02 NOTE — ED Notes (Signed)
Admitting MD at bedside.

## 2011-01-02 NOTE — ED Notes (Signed)
Attempted to call report, told nurse will call me back.

## 2011-01-02 NOTE — ED Notes (Signed)
Notified Dr.Kohut that troponin @0 .13

## 2011-01-02 NOTE — Consult Note (Signed)
CARDIOLOGY CONSULT NOTE  Patient ID: [name] [name] MRN: [id number] DOB/AGE: 01/19/1932 77 y.o.  Admit date: 01/02/2011 Primary Physician [facility] Primary Cardiologist Tamieka Rancourt Chief Complaint    HPI:  The patient has a history of CAD as described below.  In May he had a nuclear stress test with a mildly reversible defect in the inferior wall suggestive of possible ischemia (versus diaphragmatic attenuation).  We managed this medically.  He has had some ongoing atypical chest pain.  I saw him earlier this month and he was doing well with a stable atypical chest pain pattern. No change in therapy was indicated.  He presents today with probable pneumonia.  We are called with a slightly elevated troponin.  He was doing well. This morning he felt off balance and fell backwards. This was a sudden change. There was no other focal neurologic change such as loss of voice vision or focal motor weakness. He presented to the emergency room with this and with sudden chills, fever and some atypical sharp pain under his left breast. He's noted to have a probable pneumonia on chest x-ray. In retrospect he's been a little more short of breath for 3-4 days. Today he started coughing yellow sputum. He otherwise had felt relatively well prior to this. He's had no chest pressure, neck or arm discomfort. He's been getting around and doing his usual activities. He denies any PND or orthopnea. He actually stopped smoking about 3 days ago.   Past Medical History  Diagnosis Date  . CAD (coronary artery disease)     (2005 LAD 30-40% stenosis  followed by 60-65% focal stenosis.  The second diagonal had 80%   stenosis.  The circumflex had 85-90% mid stenosis and was stented with a  Taxus stent.  The obtuse marginal had 20-25% stenosis and obtuse   marginal-2 had 40-50% stenosis.  The right coronary artery had 40-50%  proximal stenosis and mid 75% stenosis), well-preserved ejection  fraction (50-55% )  . Obesity   . HTN  (hypertension)   . Sleep apnea     using CPAP   . PUD (peptic ulcer disease)   . DM (diabetes mellitus)   . Prostate cancer 2001    Radiation   . Carotid stenosis   . Sleep apnea   . History of colonic polyps   . Sigmoid diverticulosis     Hx of  . Radiation proctitis   . Bronchitis     Hx of  . Myocardial infarction     Past Surgical History  Procedure Date  . Cholecystectomy   . Left arm repair 1994  . Hernia repair     right inguinal  . Cardiac catheterization 12/13/2003    ptca, stent x 2  . Colonoscopy 05/11/2000  . Colonoscopy w/ polypectomy     No Known Allergies  Prior to Admission medications   Medication Sig      aspirin 81 MG tablet Take 81 mg by mouth daily.        cholestyramine light (PREVALITE) 4 G packet Take 4 g by mouth daily.        clopidogrel (PLAVIX) 75 MG tablet Take 75 mg by mouth daily.        guaiFENesin (ROBITUSSIN) 100 MG/5ML liquid Take 200 mg by mouth 3 (three) times daily as needed.        metFORMIN (GLUCOPHAGE) 500 MG tablet Take 500 mg by mouth 3 (three) times daily.        metoprolol succinate (TOPROL-XL) 25  MG 24 hr tablet Take 25 mg by mouth 2 (two) times daily.        omeprazole (PRILOSEC) 20 MG capsule Take 20 mg by mouth daily.        simvastatin (ZOCOR) 40 MG tablet Take 40 mg by mouth at bedtime.        telmisartan-hydrochlorothiazide (MICARDIS HCT) 80-12.5 MG per tablet Take 1 tablet by mouth daily.        Family History  Problem Relation Age of Onset  . Cancer Father   . Coronary artery disease Father 64  . Coronary artery disease Brother 59  . Coronary artery disease Sister 39  . Stroke Mother 48    History   Social History  . Marital Status: Married    Spouse Name: N/A    Number of Children: 4  . Years of Education: N/A   Occupational History  . Not on file.   Social History Main Topics  . Smoking status: Current Everyday Smoker -- 1.0 packs/day for 60 years    Types: Cigarettes  . Smokeless tobacco: Never Used   . Alcohol Use: No  . Drug Use: No  . Sexually Active: No   Other Topics Concern  . Not on file   Social History Narrative  . No narrative on file     ROS:  Chronic loose bowels.  Otherwise, as stated in the HPI and negative for all other systems.  Physical Exam: Blood pressure 115/57, pulse 98, temperature 99.5 F (37.5 C), temperature source Oral, resp. rate 22, weight 235 lb (106.595 kg), SpO2 96.00%.  GENERAL: Shaking, mildly uncomfortable looking. HEENT: Pupils equal round and reactive, fundi not visualized, oral mucosa unremarkable, poor dentition NECK: No jugular venous distention, waveform within normal limits, carotid upstroke brisk and symmetric, no bruits, no thyromegaly  LYMPHATICS: No cervical, inguinal adenopathy  LUNGS: Decreased BS with diffuse wheezing BACK: No CVA tenderness  CHEST: Unremarkable  HEART: PMI not displaced or sustained,S1 and S2 within normal limits, no S3, no S4, no clicks, no rubs, no murmurs  ABD: Flat, positive bowel sounds normal in frequency in pitch, no bruits, no rebound, no guarding, no midline pulsatile mass, no hepatomegaly, no splenomegaly, obese  EXT: 2 plus pulses throughout, no edema, no cyanosis no clubbing, left arm s/p trauma  SKIN: No rashes no nodules  NEURO: Cranial nerves II through XII grossly intact, motor grossly intact throughout  PSYCH: Cognitively intact, oriented to person place and time  Labs: Lab Results  Component Value Date   BUN 29* 01/02/2011   Lab Results  Component Value Date   CREATININE 2.11* 01/02/2011   Lab Results  Component Value Date   NA 136 01/02/2011   K 3.9 01/02/2011   CL 103 01/02/2011   CO2 18* 01/02/2011   No results found for this basename: CKTOTAL,  CKMB,  CKMBINDEX,  TROPONINI   Lab Results  Component Value Date   WBC 9.3 01/02/2011   HGB 8.2* 01/02/2011   HCT 26.3* 01/02/2011   MCV 78.3 01/02/2011   PLT 168 01/02/2011     Radiology:  CXR   1. Ill-defined airspace  opacification of the lingula. This likely represents atelectasis or early infection. Recommend follow-up chest radiograph after appropriate therapy. 2. Cardiomegaly and mild pulmonary vascular congestion without frank edema.  EKG: NSR, rate 95, RBBB, no acute ST T wave changes  ASSESSMENT AND PLAN:   1)  Elevated troponin:  I think this is an incidental finding. We should cycle cardiac  enzymes. He should continue his previous meds. I would not use heparin given the fact that time not convinced he's having any acute coronary syndrome and he has significant anemia. He did have a stress perfusion study earlier this year which was low risk. Provided there are no classic anginal symptoms or objective markers of ongoing ischemia I doubt that further evaluation will be necessary although we will follow. He should have followup EKG in the morning.  2)  Renal insufficiency:  Last creat that I can find in May was 1.5.  I will defer to his primary team.  3)  Anemia:  This has been followed by Dr. Felipa Eth and the patient has had recent follow up by his GI doctor.  Plan per primary team.  I don't see a recent Hbg in Epic to understand his most recent baseline.  4)  Tobacco:  I gave him a Magazine features editor at the last appointment. Hopefully he can continue to abstain from cigarettes.  SignedRollene Rotunda 01/02/2011, 2:54 PM

## 2011-01-02 NOTE — ED Notes (Signed)
WUJ:[id number]<JY> Expected date:01/02/11<BR> Expected time: 8:23 AM<BR> Means of arrival:Ambulance<BR> Comments:<BR> weakness

## 2011-01-03 ENCOUNTER — Inpatient Hospital Stay (HOSPITAL_COMMUNITY): Payer: Medicare Other

## 2011-01-03 DIAGNOSIS — Z720 Tobacco use: Secondary | ICD-10-CM | POA: Diagnosis present

## 2011-01-03 DIAGNOSIS — J189 Pneumonia, unspecified organism: Principal | ICD-10-CM

## 2011-01-03 DIAGNOSIS — I2510 Atherosclerotic heart disease of native coronary artery without angina pectoris: Secondary | ICD-10-CM

## 2011-01-03 LAB — DIFFERENTIAL
Basophils Relative: 0 % (ref 0–1)
Eosinophils Absolute: 0 10*3/uL (ref 0.0–0.7)
Lymphocytes Relative: 15 % (ref 12–46)
Neutro Abs: 8 10*3/uL — ABNORMAL HIGH (ref 1.7–7.7)

## 2011-01-03 LAB — CBC
HCT: 26.2 % — ABNORMAL LOW (ref 39.0–52.0)
Hemoglobin: 8.4 g/dL — ABNORMAL LOW (ref 13.0–17.0)
MCH: 25.2 pg — ABNORMAL LOW (ref 26.0–34.0)
MCHC: 32.1 g/dL (ref 30.0–36.0)

## 2011-01-03 LAB — CARDIAC PANEL(CRET KIN+CKTOT+MB+TROPI)
Total CK: 1031 U/L — ABNORMAL HIGH (ref 7–232)
Troponin I: <0.3 ng/mL (ref <0.30)

## 2011-01-03 LAB — IRON AND TIBC

## 2011-01-03 LAB — HEMOGLOBIN A1C: Hgb A1c MFr Bld: 7.7 % — ABNORMAL HIGH (ref <5.7)

## 2011-01-03 LAB — COMPREHENSIVE METABOLIC PANEL
ALT: 18 U/L (ref 0–53)
AST: 37 U/L (ref 0–37)
Albumin: 2.7 g/dL — ABNORMAL LOW (ref 3.5–5.2)
Calcium: 8.5 mg/dL (ref 8.4–10.5)
GFR calc Af Amer: 23 mL/min — ABNORMAL LOW (ref >90)
Sodium: 139 mEq/L (ref 135–145)
Total Protein: 7 g/dL (ref 6.0–8.3)

## 2011-01-03 LAB — GLUCOSE, CAPILLARY
Glucose-Capillary: 178 mg/dL — ABNORMAL HIGH (ref 70–99)
Glucose-Capillary: 157 mg/dL — ABNORMAL HIGH (ref 70–99)
Glucose-Capillary: 141 mg/dL — ABNORMAL HIGH (ref 70–99)

## 2011-01-03 LAB — FOLATE: Folate: 11 ng/mL

## 2011-01-03 LAB — VITAMIN B12: Vitamin B-12: 233 pg/mL (ref 211–911)

## 2011-01-03 LAB — STREP PNEUMONIAE URINARY ANTIGEN: Strep Pneumo Urinary Antigen: NEGATIVE

## 2011-01-03 MED ORDER — ZOLPIDEM TARTRATE 5 MG PO TABS
5.0000 mg | ORAL_TABLET | Freq: Every evening | ORAL | Status: DC | PRN
  Administered 2011-01-03: 5 mg via ORAL
  Filled 2011-01-03: qty 1

## 2011-01-03 MED ORDER — HYDROCODONE-ACETAMINOPHEN 5-325 MG PO TABS: 1.0000 | ORAL_TABLET | Freq: Four times a day (QID) | ORAL | Status: DC | PRN

## 2011-01-03 NOTE — Progress Notes (Signed)
Report Received from Merla Riches, RN. No change from initial am assessment. Will continue to follow plan of care.

## 2011-01-03 NOTE — Progress Notes (Signed)
Physical Therapy Evaluation Patient Details Name: [name] [name] MRN: [id number] DOB: 09/05/1932 Today's Date: 01/03/2011 [id number] ev2 Pro1blem List:  Patient Active Problem List  Diagnoses  . DM type 2, uncontrolled, with renal complications  . DYSLIPIDEMIA  . OBESITY, UNSPECIFIED  . TOBACCO ABUSE  . HYPERTENSION  . CAD  . SLEEP APNEA  . Carotid bruit  . Pneumonia  . Anemia  . Acute renal failure  . Elevated troponin  . Acute respiratory distress  . COPD (chronic obstructive pulmonary disease)  . Tobacco abuse    Past Medical History:  Past Medical History  Diagnosis Date  . CAD (coronary artery disease)     (2005 LAD 30-40% stenosis  followed by 60-65% focal stenosis.  The second diagonal had 80%   stenosis.  The circumflex had 85-90% mid stenosis and was stented with a  Taxus stent.  The obtuse marginal had 20-25% stenosis and obtuse   marginal-2 had 40-50% stenosis.  The right coronary artery had 40-50%  proximal stenosis and mid 75% stenosis), well-preserved ejection  fraction (50-55% )  . Obesity   . HTN (hypertension)   . Sleep apnea     using CPAP   . PUD (peptic ulcer disease)   . DM (diabetes mellitus)   . Prostate cancer 2001    Radiation   . Carotid stenosis   . Sleep apnea   . History of colonic polyps   . Sigmoid diverticulosis     Hx of  . Radiation proctitis   . Bronchitis     Hx of  . Myocardial infarction    Past Surgical History:  Past Surgical History  Procedure Date  . Cholecystectomy   . Left arm repair 1994  . Hernia repair     right inguinal  . Cardiac catheterization 12/13/2003    ptca, stent x 2  . Colonoscopy 05/11/2000  . Colonoscopy w/ polypectomy     PT Assessment/Plan/Recommendation PT Assessment Clinical Impression Statement: pt admitted witth dehydration, confusion, fall. Now c/o pain in anterior L thigh when pt flexed LLE in supine, once up, pt did not c/o any pain with weight bear.  Pt can benefit from PT to return to  functional mobility, strength and safety to DC to home. Pt will need to be Independent to return home with wife. Pt used 4 l. O2 for activity. pt may benefit from SNF if pt remains unsteady with activity PT Recommendation/Assessment: Patient will need skilled PT in the acute care venue PT Problem List: Decreased strength;Decreased range of motion;Decreased activity tolerance;Decreased knowledge of use of DME;Decreased safety awareness;Pain;Cardiopulmonary status limiting activity PT Therapy Diagnosis : Difficulty walking PT Plan PT Frequency: 7X/week PT Treatment/Interventions: DME instruction;Gait training;Stair training;Functional mobility training;Patient/family education PT Recommendation Recommendations for Other Services: OT consult Follow Up Recommendations: Home health PT;Skilled nursing facility Equipment Recommended: Rolling walker with 5" wheels PT Goals  Acute Rehab PT Goals PT Goal Formulation: With patient/family Time For Goal Achievement: 2 weeks Pt will go Supine/Side to Sit: Independently PT Goal: Supine/Side to Sit - Progress: Not met Pt will go Sit to Supine/Side: Independently PT Goal: Sit to Supine/Side - Progress: Not met Pt will go Sit to Stand: with supervision PT Goal: Sit to Stand - Progress: Not met Pt will go Stand to Sit: with supervision PT Goal: Stand to Sit - Progress: Not met Pt will Ambulate: >150 feet;with supervision;with least restrictive assistive device PT Goal: Ambulate - Progress: Not met Pt will Go Up / Down Stairs: 1-2 stairs;with  least restrictive assistive device PT Goal: Up/Down Stairs - Progress: Not met  PT Evaluation Precautions/Restrictions    Prior Functioning  Home Living Lives With: Spouse Receives Help From: Family Type of Home: House Home Access: Stairs to enter Entrance Stairs-Rails: None Entrance Stairs-Number of Steps: 1 Home Adaptive Equipment: None Prior Function Level of Independence: Independent with basic  ADLs;Independent with homemaking with ambulation;Independent with gait;Independent with transfers Able to Take Stairs?: Yes Driving: Yes Vocation: Retired Leisure: Hobbies-yes (Comment) Comments: mow grass Cognition Cognition Arousal/Alertness: Awake/alert Overall Cognitive Status: Appears within functional limits for tasks assessed Orientation Level: Oriented X4 Sensation/Coordination Sensation Light Touch: Appears Intact Coordination Gross Motor Movements are Fluid and Coordinated: No Fine Motor Movements are Fluid and Coordinated: No Coordination and Movement Description: pt has tremors and shaking of hands Extremity Assessment RUE Assessment RUE Assessment: Within Functional Limits LUE Assessment LUE Assessment: Within Functional Limits RLE Assessment RLE Assessment: Within Functional Limits LLE Assessment LLE Assessment: Exceptions to [facility] LLE Strength LLE Overall Strength Comments: pt able to move LLE but experienced pain in thigh, noted edema of thigh Mobility (including Balance) Bed Mobility Bed Mobility: Yes Supine to Sit: 3: Mod assist;HOB elevated (Comment degrees) Supine to Sit Details (indicate cue type and reason): HOB at 45, pt had  Sitting - Scoot to [place] of Bed: 3: Mod assist Transfers Transfers: Yes Sit to Stand: 1: +2 Total assist;From bed;With upper extremity assist Stand to Sit: 4: Min assist;To chair/3-in-1;With upper extremity assist Ambulation/Gait Ambulation/Gait: Yes Ambulation/Gait Assistance: 1: +2 Total assist Ambulation/Gait Assistance Details (indicate cue type and reason): pt =72, gait mildly unsteady with //RW Ambulation Distance (Feet): 80 Feet Assistive device: Rolling walker Gait Pattern: Step-through pattern  Posture/Postural Control Posture/Postural Control: No significant limitations Exercise    End of Session PT - End of Session Equipment Utilized During Treatment: Gait belt Activity Tolerance: Patient tolerated treatment well  (pt is impulsive) Patient left: in chair;with call bell in reach Nurse Communication: Mobility status for transfers (pain of LLE but not with weight bear) General Behavior During Session: [facility] for tasks performed Cognition: [facility] for tasks performed  Rada Hay 01/03/2011, 5:11 PM

## 2011-01-03 NOTE — Progress Notes (Signed)
UR CHART REVIEWED; B Keyia Moretto RN, BSN, MHA 

## 2011-01-03 NOTE — Progress Notes (Signed)
Spoke with pt about night time CPAP, pt declined wearing one tonight. Felt it wasn't necessary. Explained to pt that if at any point this changes to notify RN and respiratory would bring a machine.

## 2011-01-03 NOTE — Progress Notes (Signed)
Patient refuses to wear CPAP/BiPAP nocturnally.

## 2011-01-03 NOTE — Progress Notes (Signed)
Subjective: Patient reports that his chest pain is better.  Also reports that his breathing is better.  Objective: Vital signs in last 24 hours: Filed Vitals:   01/03/11 0800 01/03/11 0840 01/03/11 0900 01/03/11 1200  BP: 117/55  106/48 135/61  Pulse: 64  64 69  Temp: 98.2 F (36.8 C)   98.6 F (37 C)  TempSrc: Oral   Oral  Resp: 23  25 18   Height:      Weight:      SpO2: 98% 96% 94% 96%   Weight change:   Intake/Output Summary (Last 24 hours) at 01/03/11 1331 Last data filed at 01/03/11 1005  Gross per 24 hour  Intake   1510 ml  Output    385 ml  Net   1125 ml    Physical Exam: General: Awake, Oriented, No acute distress. HEENT: EOMI. Neck: Supple CV: S1 and S2 Lungs: Clear to ascultation bilaterally Abdomen: Soft, Nontender, Nondistended, +bowel sounds. Ext: Good pulses. Trace edema.  Lab Results:  [facility] 01/03/11 0329 01/02/11 1020  NA 139 136  K 4.1 3.9  CL 106 103  CO2 23 18*  GLUCOSE 147* 282*  BUN 39* 29*  CREATININE 2.83* 2.11*  CALCIUM 8.5 8.7  MG -- --  PHOS -- --    Basename 01/03/11 0329  AST 37  ALT 18  ALKPHOS 48  BILITOT 0.4  PROT 7.0  ALBUMIN 2.7*   No results found for this basename: LIPASE:2,AMYLASE:2 in the last 72 hours  Basename 01/03/11 0329 01/02/11 1020  WBC 10.6* 9.3  NEUTROABS 8.0* --  HGB 8.4* 8.2*  HCT 26.2* 26.3*  MCV 78.7 78.3  PLT 138* 168    Basename 01/03/11 0010 01/02/11 1800 01/02/11 1350  CKTOTAL 1031* 1121* 889*  CKMB 5.5* 5.2* 5.5*  CKMBINDEX -- -- --  TROPONINI <0.30 <0.30 <0.30   No components found with this basename: POCBNP:3 No results found for this basename: DDIMER:2 in the last 72 hours  Basename 01/02/11 1350  HGBA1C 7.7*   No results found for this basename: CHOL:2,HDL:2,LDLCALC:2,TRIG:2,CHOLHDL:2,LDLDIRECT:2 in the last 72 hours No results found for this basename: TSH,T4TOTAL,FREET3,T3FREE,THYROIDAB in the last 72 hours  Basename 01/03/11 0329  VITAMINB12 233  FOLATE 11.0    FERRITIN 99  TIBC Not calculated due to Iron <10.  IRON <10*  RETICCTPCT 1.3    Micro Results: Recent Results (from the past 240 hour(s))  MRSA PCR SCREENING     Status: Normal   Collection Time   01/02/11  5:37 PM      Component Value Range Status Comment   MRSA by PCR NEGATIVE  NEGATIVE  Final   CULTURE, SPUTUM-ASSESSMENT     Status: Normal   Collection Time   01/02/11  7:10 PM      Component Value Range Status Comment   Specimen Description SPUTUM   Final    Special Requests NONE   Final    Sputum evaluation     Final    Value: THIS SPECIMEN IS ACCEPTABLE. RESPIRATORY CULTURE REPORT TO FOLLOW.   Report Status 01/02/2011 FINAL   Final     Studies/Results: Dg Chest 2 View  01/02/2011  *RADIOLOGY REPORT*  Clinical Data: Lightheaded.  Shortness of breath.  CHEST - 2 VIEW  Comparison: Two-view chest radiograph 12/13/2003 at [facility].  Findings: Ill-defined airspace disease is present within the lingula. The heart is mildly enlarged.  Mild pulmonary vascular congestion is evident.  There are no significant effusions.  Mild degenerative changes  are again noted in the thoracic spine.  IMPRESSION:  1.  Ill-defined airspace opacification of the lingula.  This likely represents atelectasis or early infection.  Recommend follow-up chest radiograph after appropriate therapy. 2.  Cardiomegaly and mild pulmonary vascular congestion without frank edema.  Original Report Authenticated By: Jamesetta Orleans. MATTERN, M.D.   US Renal  01/03/2011  *RADIOLOGY REPORT*  Clinical Data: Acute renal failure  RENAL/URINARY TRACT ULTRASOUND COMPLETE  Comparison:  None available.  Report from pelvic CT 07/23/1999.  Findings:  Right Kidney:  No hydronephrosis.  Well-preserved cortex.  Normal size and parenchymal echotexture without focal abnormalities. Renal length 11.5 cm.  Left Kidney:  There is a probable cyst in the lower interpolar region measuring 1.7 x 1.6 x 1.7 cm. No hydronephrosis.  Well-  preserved cortex.  Normal size and parenchymal echotexture without other focal abnormalities. Renal length 11.9 cm.  Bladder:  Unremarkable for degree of distension.  Incidentally noted is increased hepatic echogenicity.  IMPRESSION:  1.  Both kidneys are normal in size without hydronephrosis.  A small left renal cyst is noted. 2.  Increased hepatic echogenicity suggests steatosis.  Correlate clinically.  Original Report Authenticated By: Gerrianne Scale, M.D.    Medications: I have reviewed the patient's current medications. Scheduled Meds:   . acetaminophen  1,000 mg Oral Once  . aspirin  81 mg Oral Daily  . azithromycin  500 mg Intravenous Q24H  . cefTRIAXone (ROCEPHIN)  IV  1 g Intravenous Q24H  . cholestyramine light  4 g Oral Daily  . clopidogrel  75 mg Oral QAC breakfast  . guaiFENesin  600 mg Oral BID  . insulin aspart  0-15 Units Subcutaneous TID WC  . insulin aspart  0-5 Units Subcutaneous QHS  . ipratropium  0.5 mg Nebulization Q6H  . metoprolol succinate  25 mg Oral BID  . pantoprazole  40 mg Oral Q1200  . simvastatin  40 mg Oral QHS  . sodium chloride  1,000 mL Intravenous Once   Continuous Infusions:   . sodium chloride 50 mL/hr (01/03/11 1241)   PRN Meds:.acetaminophen, guaiFENesin, levalbuterol  Assessment/Plan: 1. Pneumonia, community-acquired.  Chest x-ray on 01/02/2011 showed ill-defined airspace opacification of the lingula, suggesting atelectasis versus early infection.  Given the patient has been having cough with sputum production started the patient on ceftriaxone and azithromycin on 01/02/2011.  Ordered desaturation study in the morning to evaluate to see if the patient qualifies for home O2.  2.  History of coronary artery disease.  Appreciate cardiology evaluation.  Troponins negative x3 continue current medications.  3.  Acute renal failure.  Etiology unclear.  Patient's creatinine on May of 2012 was 1.5 was 2.83 today.  Renal ultrasound obtained with  results as indicated above.  Will send for urine sodium and urine creatinine.  Patient's hydrochlorothiazide and ARB has been held.  4.  DM 2, uncontrolled with renal complications.  Currently holding metformin.  On sliding scale insulin.  5. Hypertension.  Currently holding telmisartan and hydrochlorothiazide secondary to #3.  Holding metoprolol due to bradycardia.  Blood pressure stable at this time.  6.  History of obstructive sleep apnea.  Continue CPAP.  7.  Anemia.  Anemia panel showed serum iron less than 10.  Likely patient has iron deficiency anemia.  Start the patient on supplemental iron.  Hemoglobin stable.  8.  Acute respiratory distress.  Improved as indicated above.  9.  COPD (chronic obstructive pulmonary disease).  Stable.  No wheezing on exam.  10.  Prophylaxis.  SCDs.  11.  Disposition.  Pending. Spoke with Dr. Waynard Edwards, [facility], as the patient's PCP is Dr. Felipa Eth.  Will transfer the patient's care to [facility].   LOS: 1 day  Myiah Petkus A, MD 01/03/2011, 1:31 PM

## 2011-01-03 NOTE — Progress Notes (Signed)
Subjective:  Breathing is better this am.  No significant chest pain.  Objective:  Vital Signs in the last 24 hours: Temp:  [97 F (36.1 C)-103.5 F (39.7 C)] 98.2 F (36.8 C) (12/28 0800) Pulse Rate:  [61-101] 62  (12/28 0700) Resp:  [14-30] 21  (12/28 0700) BP: (86-156)/(31-98) 115/53 mmHg (12/28 0700) SpO2:  [92 %-100 %] 97 % (12/28 0700) Weight:  [235 lb (106.595 kg)] 235 lb (106.595 kg) (12/27 0852)  Intake/Output from previous day: 12/27 0701 - 12/28 0700 In: 1020 [P.O.:420; I.V.:600] Out: 235 [Urine:235] Intake/Output from this shift:       . acetaminophen  1,000 mg Oral Once  . acetaminophen  650 mg Oral Once  . albuterol      . aspirin  324 mg Oral Once  . aspirin  81 mg Oral Daily  . azithromycin (ZITHROMAX) 500 MG IVPB  500 mg Intravenous Once  . azithromycin  500 mg Intravenous Q24H  . cefTRIAXone (ROCEPHIN)  IV  1 g Intravenous Once  . cefTRIAXone (ROCEPHIN)  IV  1 g Intravenous Q24H  . cholestyramine light  4 g Oral Daily  . clopidogrel  75 mg Oral QAC breakfast  . guaiFENesin  600 mg Oral BID  . insulin aspart  0-15 Units Subcutaneous TID WC  . insulin aspart  0-5 Units Subcutaneous QHS  . ipratropium  0.5 mg Nebulization Q6H  . metoprolol succinate  25 mg Oral BID  . pantoprazole  40 mg Oral Q1200  . simvastatin  40 mg Oral QHS  . sodium chloride  1,000 mL Intravenous Once  . sodium chloride  1,000 mL Intravenous Once  . DISCONTD: sodium chloride  1,000 mL Intravenous Once      . sodium chloride 50 mL/hr at 01/02/11 1833    Physical Exam: The patient appears to be in no distress.  Head and neck exam reveals that the pupils are equal and reactive.  The extraocular movements are full.  There is no scleral icterus.  Mouth and pharynx are benign.  No lymphadenopathy.  No carotid bruits.  The jugular venous pressure is normal.  Thyroid is not enlarged or tender.  Chest is clear to percussion and auscultation.  No rales or rhonchi.  Expansion of  the chest is symmetrical.  Heart reveals no abnormal lift or heave.  First and second heart sounds are normal.  There is no murmur gallop rub or click.  The abdomen is soft and nontender.  Bowel sounds are normoactive.  There is no hepatosplenomegaly or mass.  There are no abdominal bruits.  Extremities reveal no phlebitis or edema.  Pedal pulses are good.  There is no cyanosis or clubbing.  Neurologic exam is normal strength and no lateralizing weakness.  No sensory deficits.  Integument reveals no rash  Lab Results:  Basename 01/03/11 0329 01/02/11 1020  WBC 10.6* 9.3  HGB 8.4* 8.2*  PLT 138* 168    Basename 01/03/11 0329 01/02/11 1020  NA 139 136  K 4.1 3.9  CL 106 103  CO2 23 18*  GLUCOSE 147* 282*  BUN 39* 29*  CREATININE 2.83* 2.11*    Basename 01/03/11 0010 01/02/11 1800  TROPONINI <0.30 <0.30   Hepatic Function Panel  Basename 01/03/11 0329  PROT 7.0  ALBUMIN 2.7*  AST 37  ALT 18  ALKPHOS 48  BILITOT 0.4  BILIDIR --  IBILI --   No results found for this basename: CHOL in the last 72 hours No results found for  this basename: PROTIME in the last 72 hours  Imaging:   Cardiac Studies: Repeat EKG this am stable NSR with RBBB. No ischemic changes. Assessment/Plan:    Repeat troponins are normal x 2. No active cardiac complaints. EKG shows no ischemic changes.  Rec: Continue current cardiac Rx. Cigarette cessation.     LOS: 1 day    [name] [name] 01/03/2011, 8:35 AM

## 2011-01-04 LAB — BASIC METABOLIC PANEL
Calcium: 8.9 mg/dL (ref 8.4–10.5)
GFR calc non Af Amer: 22 mL/min — ABNORMAL LOW (ref >90)
Glucose, Bld: 170 mg/dL — ABNORMAL HIGH (ref 70–99)
Potassium: 4.2 mEq/L (ref 3.5–5.1)
Sodium: 141 mEq/L (ref 135–145)

## 2011-01-04 LAB — CBC
Hemoglobin: 8.9 g/dL — ABNORMAL LOW (ref 13.0–17.0)
MCH: 24.1 pg — ABNORMAL LOW (ref 26.0–34.0)
MCHC: 30.3 g/dL (ref 30.0–36.0)
Platelets: 186 10*3/uL (ref 150–400)
RBC: 3.69 MIL/uL — ABNORMAL LOW (ref 4.22–5.81)

## 2011-01-04 LAB — CREATININE, URINE, RANDOM: Creatinine, Urine: 113.8 mg/dL

## 2011-01-04 LAB — SODIUM, URINE, RANDOM: Sodium, Ur: 68 mEq/L

## 2011-01-04 LAB — GLUCOSE, CAPILLARY
Glucose-Capillary: 151 mg/dL — ABNORMAL HIGH (ref 70–99)
Glucose-Capillary: 207 mg/dL — ABNORMAL HIGH (ref 70–99)

## 2011-01-04 MED ORDER — FLORA-Q PO CAPS
1.0000 | ORAL_CAPSULE | Freq: Every day | ORAL | Status: DC
  Administered 2011-01-04 – 2011-01-07 (×4): 1 via ORAL
  Filled 2011-01-04 (×4): qty 1

## 2011-01-04 MED ORDER — BUDESONIDE 0.5 MG/2ML IN SUSP
0.5000 mg | Freq: Two times a day (BID) | RESPIRATORY_TRACT | Status: DC
  Administered 2011-01-04 – 2011-01-07 (×8): 0.5 mg via RESPIRATORY_TRACT
  Filled 2011-01-04 (×8): qty 2

## 2011-01-04 NOTE — Progress Notes (Signed)
Occupational Therapy Evaluation Patient Details Name: [name] [name] MRN: [id number] DOB: 09/13/1932 Today's Date: 12/29/20121520 1621 ev2  Problem List:  Patient Active Problem List  Diagnoses  . DM type 2, uncontrolled, with renal complications  . DYSLIPIDEMIA  . OBESITY, UNSPECIFIED  . TOBACCO ABUSE  . HYPERTENSION  . CAD  . SLEEP APNEA  . Carotid bruit  . Pneumonia  . Anemia  . Acute renal failure  . Elevated troponin  . Acute respiratory distress  . COPD (chronic obstructive pulmonary disease)  . Tobacco abuse    Past Medical History:  Past Medical History  Diagnosis Date  . CAD (coronary artery disease)     (2005 LAD 30-40% stenosis  followed by 60-65% focal stenosis.  The second diagonal had 80%   stenosis.  The circumflex had 85-90% mid stenosis and was stented with a  Taxus stent.  The obtuse marginal had 20-25% stenosis and obtuse   marginal-2 had 40-50% stenosis.  The right coronary artery had 40-50%  proximal stenosis and mid 75% stenosis), well-preserved ejection  fraction (50-55% )  . Obesity   . HTN (hypertension)   . Sleep apnea     using CPAP   . PUD (peptic ulcer disease)   . DM (diabetes mellitus)   . Prostate cancer 2001    Radiation   . Carotid stenosis   . Sleep apnea   . History of colonic polyps   . Sigmoid diverticulosis     Hx of  . Radiation proctitis   . Bronchitis     Hx of  . Myocardial infarction    Past Surgical History:  Past Surgical History  Procedure Date  . Cholecystectomy   . Left arm repair 1994  . Hernia repair     right inguinal  . Cardiac catheterization 12/13/2003    ptca, stent x 2  . Colonoscopy 05/11/2000  . Colonoscopy w/ polypectomy     OT Assessment/Plan/Recommendation OT Assessment Clinical Impression Statement: This 77 yo man was admitted after a fall and had confusion.  He is now moving at overall min guard level and is maintaining 02 sats with 4 liters.  He is appropriate for skilled OT to increase safety  and independence with ADLs with supervision goals in acute OT Recommendation/Assessment: Patient will need skilled OT in the acute care venue OT Problem List: Decreased strength;Decreased activity tolerance;Impaired balance (sitting and/or standing);Cardiopulmonary status limiting activity Barriers to Discharge: Other (comment) (uncertain if wife is home to assist) OT Therapy Diagnosis : Generalized weakness OT Plan OT Frequency: Min 2X/week OT Treatment/Interventions: Self-care/ADL training;Energy conservation;Therapeutic activities;Patient/family education;Balance training OT Recommendation Follow Up Recommendations: Home health OT;Other (comment);24 hour supervision/assistance (vs. stsnf, depending on progress) Equipment Recommended: Other (comment) (will further assess bathroom DME) Individuals Consulted Consulted and Agree with Results and Recommendations: Patient OT Goals Acute Rehab OT Goals OT Goal Formulation: With patient Time For Goal Achievement: 2 weeks ADL Goals Pt Will Perform Lower Body Bathing: with supervision;with set-up;Sit to stand from chair ADL Goal: Lower Body Bathing - Progress: Not met Pt Will Perform Lower Body Dressing: with set-up;with supervision;Sit to stand from chair ADL Goal: Lower Body Dressing - Progress: Not met Pt Will Transfer to Toilet: with supervision;Ambulation;3-in-1 ADL Goal: Toilet Transfer - Progress: Not met Pt Will Perform Toileting - Clothing Manipulation: with supervision;Standing ADL Goal: Toileting - Clothing Manipulation - Progress: Not met Pt Will Perform Toileting - Hygiene: with supervision;Standing at 3-in-1/toilet ADL Goal: Toileting - Hygiene - Progress: Not met Pt Will Perform  Tub/Shower Transfer: Shower transfer;with min assist;Other (comment);Shower seat with back (min guard) ADL Goal: Web designer - Progress: Not met Miscellaneous OT Goals Miscellaneous OT Goal #1: Pt will verbalize 2 energy conservation  techniques OT Goal: Miscellaneous Goal #1 - Progress: Not met  OT Evaluation Precautions/Restrictions  Restrictions Weight Bearing Restrictions: No Prior Functioning Home Living Bathroom Shower/Tub: Walk-in shower Bathroom Toilet: Standard (vanity next to it) Prior Function Comments: 12/29 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 their property ADL ADL Grooming: Simulated;Supervision/safety Where Assessed - Grooming: Standing at sink Upper Body Bathing: Simulated;Set up Where Assessed - Upper Body Bathing: Sitting, bed;Unsupported Lower Body Bathing: Simulated;Minimal assistance;Other (comment) (min guard) Where Assessed - Lower Body Bathing: Sit to stand from bed Upper Body Dressing: Performed;Minimal assistance (lines) Where Assessed - Upper Body Dressing: Sitting, bed;Unsupported Lower Body Dressing: Performed;Other (comment);Simulated (set up for socks; simulated pants:  min guard) Where Assessed - Lower Body Dressing: Sit to stand from bed Toilet Transfer: Simulated;Minimal assistance;Other (comment) (min guard to recliner from bed) Toilet Transfer Method: Stand pivot Toileting - Clothing Manipulation: Simulated;Minimal assistance;Other (comment) (min guard) Where Assessed - Toileting Clothing Manipulation: Standing Toileting - Hygiene: Simulated;Minimal assistance;Other (comment) (min guard) Where Assessed - Toileting Hygiene: Standing Equipment Used: Rolling walker ADL Comments: Pt tolerated session well; tends to lean upper trunk posteriorly but states this is baseline.  Had done all adl tasks earlier Vision/Perception  Vision - History Patient Visual Report: No change from baseline Cognition Cognition Overall Cognitive Status: Appears within functional limits for tasks assessed (unable to verify if he still works 1 day week/if wife works) Magazine features editor Level: Oriented X4 Sensation/Coordination Sensation Additional  Comments: no pain today Coordination Fine Motor Movements are Fluid and Coordinated: Yes Extremity Assessment RUE Assessment RUE Assessment: Within Functional Limits LUE Assessment LUE Assessment: Within Functional Limits, pt has no elbow joint; unable to fully flex or supinate--from accident when he was 60,per pt Mobility  Bed Mobility Bed Mobility: Yes Supine to Sit: 4: Min assist;With rails;Other (comment) (tends to hold breath) Supine to Sit Details (indicate cue type and reason): min cues for breathing Transfers Transfers: Yes Sit to Stand: 4: Min assist Sit to Stand Details (indicate cue type and reason): min cues:  tends to pull up on walker; states he walks without device Exercises   End of Session OT - End of Session Activity Tolerance: Patient tolerated treatment well Patient left: in chair;with call bell in reach Nurse Communication: Other (comment) (NT:  emptied urinal 250) General Behavior During Session: [facility] for tasks performed Cognition: [facility] for tasks performed   Sabrinia Prien [phone] 01/04/2011, 4:51 PM

## 2011-01-04 NOTE — Progress Notes (Signed)
Pt refuses CPAP 

## 2011-01-04 NOTE — Progress Notes (Signed)
Subjective: He feels some better than on admission.  Objective: Vital signs in last 24 hours: Temp:  [97.6 F (36.4 C)-99.1 F (37.3 C)] 97.6 F (36.4 C) (12/29 1105) Pulse Rate:  [75-115] 115  (12/29 0500) Resp:  [20-22] 20  (12/29 1105) BP: (116-147)/(54-63) 116/56 mmHg (12/29 1105) SpO2:  [93 %-96 %] 93 % (12/29 0830) Weight change:  Last BM Date: 01/04/11  Intake/Output from previous day: 12/28 0701 - 12/29 0700 In: 1520 [P.O.:720; I.V.:750; IV Piggyback:50] Out: 1675 [Urine:1675] Intake/Output this shift:    General appearance: alert, cooperative and appears stated age Resp: decreased breath sounds bilaterally, he has insp. wheeze bilaterally and some rales left base. Cardio: regular rate and rhythm, S1, S2 normal, no murmur, click, rub or gallop GI: soft, non-tender; bowel sounds normal; no masses,  no organomegaly Extremities: extremities normal, atraumatic, no cyanosis or edema Neurologic: Grossly normal   Lab Results:  Basename 01/04/11 0340 01/03/11 0329  WBC 12.0* 10.6*  HGB 8.9* 8.4*  HCT 29.4* 26.2*  PLT 186 138*   BMET  Basename 01/04/11 0340 01/03/11 0329  NA 141 139  K 4.2 4.1  CL 106 106  CO2 21 23  GLUCOSE 170* 147*  BUN 49* 39*  CREATININE 2.68* 2.83*  CALCIUM 8.9 8.5   CMET CMP     Component Value Date/Time   NA 141 01/04/2011 0340   K 4.2 01/04/2011 0340   CL 106 01/04/2011 0340   CO2 21 01/04/2011 0340   GLUCOSE 170* 01/04/2011 0340   BUN 49* 01/04/2011 0340   CREATININE 2.68* 01/04/2011 0340   CALCIUM 8.9 01/04/2011 0340   PROT 7.0 01/03/2011 0329   ALBUMIN 2.7* 01/03/2011 0329   AST 37 01/03/2011 0329   ALT 18 01/03/2011 0329   ALKPHOS 48 01/03/2011 0329   BILITOT 0.4 01/03/2011 0329   GFRNONAA 22* 01/04/2011 0340   GFRAA 25* 01/04/2011 0340     Studies/Results: US Renal  01/03/2011  *RADIOLOGY REPORT*  Clinical Data: Acute renal failure  RENAL/URINARY TRACT ULTRASOUND COMPLETE  Comparison:  None available.  Report  from pelvic CT 07/23/1999.  Findings:  Right Kidney:  No hydronephrosis.  Well-preserved cortex.  Normal size and parenchymal echotexture without focal abnormalities. Renal length 11.5 cm.  Left Kidney:  There is a probable cyst in the lower interpolar region measuring 1.7 x 1.6 x 1.7 cm. No hydronephrosis.  Well- preserved cortex.  Normal size and parenchymal echotexture without other focal abnormalities. Renal length 11.9 cm.  Bladder:  Unremarkable for degree of distension.  Incidentally noted is increased hepatic echogenicity.  IMPRESSION:  1.  Both kidneys are normal in size without hydronephrosis.  A small left renal cyst is noted. 2.  Increased hepatic echogenicity suggests steatosis.  Correlate clinically.  Original Report Authenticated By: Gerrianne Scale, M.D.    Medications: I have reviewed the patient's current medications.     Marland Kitchen aspirin  81 mg Oral Daily  . azithromycin  500 mg Intravenous Q24H  . cefTRIAXone (ROCEPHIN)  IV  1 g Intravenous Q24H  . cholestyramine light  4 g Oral Daily  . clopidogrel  75 mg Oral QAC breakfast  . guaiFENesin  600 mg Oral BID  . insulin aspart  0-15 Units Subcutaneous TID WC  . insulin aspart  0-5 Units Subcutaneous QHS  . ipratropium  0.5 mg Nebulization Q6H  . metoprolol succinate  25 mg Oral BID  . pantoprazole  40 mg Oral Q1200  . simvastatin  40 mg Oral QHS  .  sodium chloride  1,000 mL Intravenous Once   CBG (last 3)   Basename 01/04/11 1207 01/04/11 0740 01/03/11 2111  GLUCAP 200* 151* 141*      Assessment/Plan:  Principal Problem:  *Pneumonia-continue antibiotics. Add probiotic.  Add xopenex nebs.  He has wheezing and is a smoker and i am  Sure there is a sig. Component of copd with ae of copd. Add pulmicort neb as well. Active Problems:  DM type 2, uncontrolled, with renal complications-on insulin  HYPERTENSION-follow  CAD-follow  SLEEP APNEA-uses cpap at home. Refused it here. Encouraged him to wear it here as well.   Anemia-follow  Acute renal failure-mionitor daily. i don't know his baseline cr.  Renal u/s okay  Elevated troponin-follow. No sign of acs.  Acute respiratory distress-see above  COPD (chronic obstructive pulmonary disease)-see above  Tobacco abuse-counseled.   LOS: 2 days   Ezequiel Kayser, MD 01/04/2011, 12:39 PM

## 2011-01-05 LAB — DIFFERENTIAL
Basophils Absolute: 0.1 K/uL (ref 0.0–0.1)
Basophils Relative: 1 % (ref 0–1)
Eosinophils Absolute: 0 K/uL (ref 0.0–0.7)
Eosinophils Relative: 0 % (ref 0–5)
Lymphocytes Relative: 22 % (ref 12–46)
Lymphs Abs: 1.5 K/uL (ref 0.7–4.0)
Monocytes Absolute: 0.4 K/uL (ref 0.1–1.0)
Monocytes Relative: 6 % (ref 3–12)
Neutro Abs: 4.7 K/uL (ref 1.7–7.7)
Neutrophils Relative %: 71 % (ref 43–77)

## 2011-01-05 LAB — COMPREHENSIVE METABOLIC PANEL
AST: 37 U/L (ref 0–37)
Albumin: 2.5 g/dL — ABNORMAL LOW (ref 3.5–5.2)
CO2: 21 mEq/L (ref 19–32)
Calcium: 8.9 mg/dL (ref 8.4–10.5)
Creatinine, Ser: 2.16 mg/dL — ABNORMAL HIGH (ref 0.50–1.35)
GFR calc non Af Amer: 28 mL/min — ABNORMAL LOW (ref >90)
Sodium: 139 mEq/L (ref 135–145)
Total Protein: 6.7 g/dL (ref 6.0–8.3)

## 2011-01-05 LAB — CULTURE, RESPIRATORY W GRAM STAIN: Gram Stain: NONE SEEN

## 2011-01-05 LAB — CBC
MCH: 24.9 pg — ABNORMAL LOW (ref 26.0–34.0)
MCHC: 31.5 g/dL (ref 30.0–36.0)
MCV: 79.1 fL (ref 78.0–100.0)
Platelets: 159 10*3/uL (ref 150–400)
RDW: 15.9 % — ABNORMAL HIGH (ref 11.5–15.5)

## 2011-01-05 LAB — GLUCOSE, CAPILLARY
Glucose-Capillary: 161 mg/dL — ABNORMAL HIGH (ref 70–99)
Glucose-Capillary: 165 mg/dL — ABNORMAL HIGH (ref 70–99)
Glucose-Capillary: 192 mg/dL — ABNORMAL HIGH (ref 70–99)

## 2011-01-05 LAB — IRON AND TIBC
Saturation Ratios: 4 % — ABNORMAL LOW (ref 20–55)
UIBC: 236 ug/dL (ref 125–400)

## 2011-01-05 LAB — RETICULOCYTES
RBC.: 3.29 MIL/uL — ABNORMAL LOW (ref 4.22–5.81)
Retic Count, Absolute: 26.3 10*3/uL (ref 19.0–186.0)
Retic Ct Pct: 0.8 % (ref 0.4–3.1)

## 2011-01-05 LAB — FERRITIN: Ferritin: 98 ng/mL (ref 22–322)

## 2011-01-05 MED ORDER — TIOTROPIUM BROMIDE MONOHYDRATE 18 MCG IN CAPS
18.0000 ug | ORAL_CAPSULE | Freq: Every day | RESPIRATORY_TRACT | Status: DC
  Administered 2011-01-06 – 2011-01-07 (×2): 18 ug via RESPIRATORY_TRACT
  Filled 2011-01-05: qty 5

## 2011-01-05 MED ORDER — ACETAMINOPHEN 325 MG PO TABS
650.0000 mg | ORAL_TABLET | Freq: Once | ORAL | Status: AC
  Administered 2011-01-05: 650 mg via ORAL
  Filled 2011-01-05: qty 2

## 2011-01-05 MED ORDER — IPRATROPIUM BROMIDE 0.02 % IN SOLN: 0.5000 mg | Freq: Two times a day (BID) | RESPIRATORY_TRACT | Status: DC

## 2011-01-05 NOTE — Progress Notes (Signed)
Subjective: No new complaints. He is getting blood.  Objective: Vital signs in last 24 hours: Temp:  [97.7 F (36.5 C)-98.8 F (37.1 C)] 97.9 F (36.6 C) (12/30 1500) Pulse Rate:  [58-65] 60  (12/30 1500) Resp:  [18-22] 22  (12/30 1500) BP: (118-180)/(61-79) 177/71 mmHg (12/30 1500) SpO2:  [92 %-99 %] 96 % (12/30 1525) Weight change:  Last BM Date: 01/04/11  Intake/Output from previous day: 12/29 0701 - 12/30 0700 In: 1740 [P.O.:720; I.V.:720; IV Piggyback:300] Out: 1500 [Urine:1500] Intake/Output this shift: Total I/O In: 850 [I.V.:250; Blood:600] Out: 500 [Urine:500]  General appearance: alert, cooperative and appears stated age Resp: decreased breath sounds bilaterally, some wheezing but a bit better today. Cardio: regular rate and rhythm, S1, S2 normal, no murmur, click, rub or gallop GI: soft, non-tender; bowel sounds normal; no masses,  no organomegaly Extremities: extremities normal, atraumatic, no cyanosis or edema Neurologic: Grossly normal   Lab Results:  Basename 01/05/11 0716 01/04/11 0340  WBC 6.7 12.0*  HGB 6.9* 8.9*  HCT 21.9* 29.4*  PLT 159 186   BMET  Basename 01/05/11 0716 01/04/11 0340  NA 139 141  K 4.2 4.2  CL 108 106  CO2 21 21  GLUCOSE 162* 170*  BUN 48* 49*  CREATININE 2.16* 2.68*  CALCIUM 8.9 8.9   CMET CMP     Component Value Date/Time   NA 139 01/05/2011 0716   K 4.2 01/05/2011 0716   CL 108 01/05/2011 0716   CO2 21 01/05/2011 0716   GLUCOSE 162* 01/05/2011 0716   BUN 48* 01/05/2011 0716   CREATININE 2.16* 01/05/2011 0716   CALCIUM 8.9 01/05/2011 0716   PROT 6.7 01/05/2011 0716   ALBUMIN 2.5* 01/05/2011 0716   AST 37 01/05/2011 0716   ALT 28 01/05/2011 0716   ALKPHOS 50 01/05/2011 0716   BILITOT 0.2* 01/05/2011 0716   GFRNONAA 28* 01/05/2011 0716   GFRAA 32* 01/05/2011 0716     Studies/Results: No results found.  Medications: I have reviewed the patient's current medications.     Marland Kitchen acetaminophen  650 mg Oral  Once  . aspirin  81 mg Oral Daily  . azithromycin  500 mg Intravenous Q24H  . budesonide  0.5 mg Nebulization BID  . cefTRIAXone (ROCEPHIN)  IV  1 g Intravenous Q24H  . cholestyramine light  4 g Oral Daily  . clopidogrel  75 mg Oral QAC breakfast  . Flora-Q  1 capsule Oral Daily  . guaiFENesin  600 mg Oral BID  . insulin aspart  0-15 Units Subcutaneous TID WC  . insulin aspart  0-5 Units Subcutaneous QHS  . ipratropium  0.5 mg Nebulization BID  . metoprolol succinate  25 mg Oral BID  . pantoprazole  40 mg Oral Q1200  . simvastatin  40 mg Oral QHS  . sodium chloride  1,000 mL Intravenous Once  . DISCONTD: ipratropium  0.5 mg Nebulization Q6H   CBG (last 3)   Basename 01/05/11 1159 01/05/11 0807 01/04/11 2113  GLUCAP 165* 161* 207*      Assessment/Plan:  Principal Problem:  *Pneumonia-continue meds. Active Problems:  DM type 2, uncontrolled, with renal complications-stable  HYPERTENSION-follow  CAD-follow  SLEEP APNEA-cpap although he is refuses here.  Anemia-this sounds like a bit of a chronic issue.  He says he was given two units prbc in November.  He has had over 20 colonoscopies he says via Dr. Matthias Hughs.  I have ordered hemoccult test.  I have ordered anemia panel. spep and upep to  be ordered too.  i suspect some of this may relate to ckd and he may need aranesp shots moving forward.  Acute renal failure-follow. Bun and cr down a bit.  Elevated troponin-no sign of acd  Acute respiratory distress-doing better  COPD (chronic obstructive pulmonary disease)-on rx.  Add spiriva daily  Tobacco abuse-he needs to quit, counseled.   LOS: 3 days   Ezequiel Kayser, MD 01/05/2011, 3:46 PM

## 2011-01-05 NOTE — Progress Notes (Signed)
Lab called for crirical result of Hgb=6.9. MD notified and aware.

## 2011-01-06 LAB — DIFFERENTIAL
Basophils Relative: 1 % (ref 0–1)
Eosinophils Absolute: 0.1 10*3/uL (ref 0.0–0.7)
Lymphs Abs: 1.4 10*3/uL (ref 0.7–4.0)
Neutro Abs: 3.2 10*3/uL (ref 1.7–7.7)
Neutrophils Relative %: 62 % (ref 43–77)

## 2011-01-06 LAB — COMPREHENSIVE METABOLIC PANEL
ALT: 32 U/L (ref 0–53)
Albumin: 2.4 g/dL — ABNORMAL LOW (ref 3.5–5.2)
Alkaline Phosphatase: 55 U/L (ref 39–117)
Chloride: 111 mEq/L (ref 96–112)
Potassium: 4.1 mEq/L (ref 3.5–5.1)
Sodium: 141 mEq/L (ref 135–145)
Total Bilirubin: 0.2 mg/dL — ABNORMAL LOW (ref 0.3–1.2)
Total Protein: 6.7 g/dL (ref 6.0–8.3)

## 2011-01-06 LAB — GLUCOSE, CAPILLARY
Glucose-Capillary: 160 mg/dL — ABNORMAL HIGH (ref 70–99)
Glucose-Capillary: 140 mg/dL — ABNORMAL HIGH (ref 70–99)

## 2011-01-06 LAB — CBC
MCH: 25.3 pg — ABNORMAL LOW (ref 26.0–34.0)
MCHC: 31.8 g/dL (ref 30.0–36.0)
Platelets: 152 10*3/uL (ref 150–400)
RBC: 3.64 MIL/uL — ABNORMAL LOW (ref 4.22–5.81)

## 2011-01-06 LAB — TYPE AND SCREEN
ABO/RH(D): O NEG
Unit division: 0

## 2011-01-06 MED ORDER — FERROUS SULFATE 325 (65 FE) MG PO TABS
325.0000 mg | ORAL_TABLET | Freq: Three times a day (TID) | ORAL | Status: DC
  Administered 2011-01-06 – 2011-01-07 (×3): 325 mg via ORAL
  Filled 2011-01-06 (×5): qty 1

## 2011-01-06 MED ORDER — CYANOCOBALAMIN 1000 MCG/ML IJ SOLN
1000.0000 ug | Freq: Once | INTRAMUSCULAR | Status: AC
  Administered 2011-01-06: 1000 ug via INTRAMUSCULAR
  Filled 2011-01-06: qty 1

## 2011-01-06 MED ORDER — DARBEPOETIN ALFA-POLYSORBATE 60 MCG/0.3ML IJ SOLN
60.0000 ug | INTRAMUSCULAR | Status: DC
  Administered 2011-01-06: 60 ug via SUBCUTANEOUS
  Filled 2011-01-06: qty 0.3

## 2011-01-06 MED ORDER — CYANOCOBALAMIN 500 MCG PO TABS
500.0000 ug | ORAL_TABLET | Freq: Every day | ORAL | Status: DC
  Administered 2011-01-06 – 2011-01-07 (×2): 500 ug via ORAL
  Filled 2011-01-06 (×2): qty 1

## 2011-01-06 MED ORDER — SENNOSIDES-DOCUSATE SODIUM 8.6-50 MG PO TABS
1.0000 | ORAL_TABLET | Freq: Every evening | ORAL | Status: DC | PRN
  Filled 2011-01-06: qty 1

## 2011-01-06 NOTE — Progress Notes (Addendum)
Subjective: Still on 4L oxygen. Advised nurse to try to wean this minimum amount needed today. No complaints.  Objective: Vital signs in last 24 hours: Temp:  [97.7 F (36.5 C)-98.4 F (36.9 C)] 98.2 F (36.8 C) (12/31 0419) Pulse Rate:  [58-62] 58  (12/31 0419) Resp:  [18-22] 18  (12/31 0419) BP: (126-177)/(60-79) 126/67 mmHg (12/31 0419) SpO2:  [94 %-97 %] 94 % (12/31 0838) Weight change:  Last BM Date: 01/04/11  Intake/Output from previous day: 12/30 0701 - 12/31 0700 In: 2876 [P.O.:1680; I.V.:596; Blood:600] Out: 2001 [Urine:2000; Stool:1] Intake/Output this shift:    General appearance: alert, cooperative and appears stated age Resp: still wheeze bilat and some decrease in air movement bilat. no rhonchi or rales. Cardio: regular rate and rhythm, S1, S2 normal, no murmur, click, rub or gallop GI: soft, non-tender; bowel sounds normal; no masses,  no organomegaly Extremities: extremities normal, atraumatic, no cyanosis or edema Neurologic: Grossly normal   Lab Results:  Basename 01/06/11 0430 01/05/11 0716  WBC 5.1 6.7  HGB 9.2* 6.9*  HCT 28.9* 21.9*  PLT 152 159   BMET  Basename 01/06/11 0430 01/05/11 0716  NA 141 139  K 4.1 4.2  CL 111 108  CO2 21 21  GLUCOSE 151* 162*  BUN 40* 48*  CREATININE 1.96* 2.16*  CALCIUM 8.9 8.9   CMET CMP     Component Value Date/Time   NA 141 01/06/2011 0430   K 4.1 01/06/2011 0430   CL 111 01/06/2011 0430   CO2 21 01/06/2011 0430   GLUCOSE 151* 01/06/2011 0430   BUN 40* 01/06/2011 0430   CREATININE 1.96* 01/06/2011 0430   CALCIUM 8.9 01/06/2011 0430   PROT 6.7 01/06/2011 0430   ALBUMIN 2.4* 01/06/2011 0430   AST 34 01/06/2011 0430   ALT 32 01/06/2011 0430   ALKPHOS 55 01/06/2011 0430   BILITOT 0.2* 01/06/2011 0430   GFRNONAA 31* 01/06/2011 0430   GFRAA 36* 01/06/2011 0430     Studies/Results: No results found.  Medications: I have reviewed the patient's current medications.     Marland Kitchen aspirin  81 mg Oral  Daily  . azithromycin  500 mg Intravenous Q24H  . budesonide  0.5 mg Nebulization BID  . cefTRIAXone (ROCEPHIN)  IV  1 g Intravenous Q24H  . cholestyramine light  4 g Oral Daily  . clopidogrel  75 mg Oral QAC breakfast  . Flora-Q  1 capsule Oral Daily  . guaiFENesin  600 mg Oral BID  . insulin aspart  0-15 Units Subcutaneous TID WC  . insulin aspart  0-5 Units Subcutaneous QHS  . metoprolol succinate  25 mg Oral BID  . pantoprazole  40 mg Oral Q1200  . simvastatin  40 mg Oral QHS  . sodium chloride  1,000 mL Intravenous Once  . tiotropium  18 mcg Inhalation Daily  . DISCONTD: ipratropium  0.5 mg Nebulization Q6H  . DISCONTD: ipratropium  0.5 mg Nebulization BID     Assessment/Plan:  Principal Problem:  *Pneumonia-continue rocephin. Stop azithro. Active Problems:  DM type 2, uncontrolled, with renal complications-cbg okay.  HYPERTENSION-stable.  CAD-with all of the anemia and heme pos. Stool we will stop asa forever and just use plavix monotherapy moving forward. He is on protonix and we'll continue that.  He has had many gi evals before (even in last year) so i will not call them again now. He will likely need 1-2 more units prbc before leaving the hospital this time. I will also ask pharm  to give aranesp as he has ckd and this is also probably affecting his blood counts.  SLEEP APNEA-cpap nightly at home.  Oxygen here as he refuses cpap here for some reason.  Anemia-see discussion above.  Acute renal failure-improved some COPD (chronic obstructive pulmonary disease)- spiriva added. Will need that long term.  Tobacco abuse-counseled. He is iron deficient and b12 deficient. Start b12 shots.  May need feraheme at some point but the transfusions will help with iron level for now.   LOS: 4 days   Ezequiel Kayser, MD 01/06/2011, 10:47 AM

## 2011-01-06 NOTE — Progress Notes (Addendum)
MEDICATION RELATED CONSULT NOTE - INITIAL   Pharmacy Consult for Aranesp Indication: Anemia associated with CKD  No Known Allergies  Patient Measurements: Height: 5' 6.93" (170 cm) Weight: 235 lb (106.595 kg) IBW/kg (Calculated) : 65.94   Vital Signs: Temp: 98.2 F (36.8 C) (12/31 0419) Temp src: Oral (12/31 0419) BP: 126/67 mmHg (12/31 0419) Pulse Rate: 58  (12/31 0419) Intake/Output from previous day: 12/30 0701 - 12/31 0700 In: 2876 [P.O.:1680; I.V.:596; Blood:600] Out: 2001 [Urine:2000; Stool:1] Intake/Output from this shift: Total I/O In: 360 [P.O.:360] Out: 300 [Urine:300]  Labs:  [facility] 01/06/11 0430 01/05/11 0716 01/04/11 0340 01/03/11 1457  WBC 5.1 6.7 12.0* --  HGB 9.2* 6.9* 8.9* --  HCT 28.9* 21.9* 29.4* --  PLT 152 159 186 --  APTT -- -- -- --  CREATININE 1.96* 2.16* 2.68* --  LABCREA -- -- -- 113.8  CREATININE 1.96* 2.16* 2.68* --  CREAT24HRUR -- -- -- --  MG -- -- -- --  PHOS -- -- -- --  ALBUMIN 2.4* 2.5* -- --  PROT 6.7 6.7 -- --  ALBUMIN 2.4* 2.5* -- --  AST 34 37 -- --  ALT 32 28 -- --  ALKPHOS 55 50 -- --  BILITOT 0.2* 0.2* -- --  BILIDIR -- -- -- --  IBILI -- -- -- --   Estimated Creatinine Clearance: 37.3 ml/min (by C-G formula based on Cr of 1.96).   Microbiology: Recent Results (from the past 720 hour(s))  CULTURE, BLOOD (ROUTINE X 2)     Status: Normal (Preliminary result)   Collection Time   01/02/11  1:50 PM      Component Value Range Status Comment   Specimen Description BLOOD LEFT ARM   Final    Special Requests BOTTLES DRAWN AEROBIC AND ANAEROBIC [facility] EACH   Final    Setup Time [id number]   Final    Culture     Final    Value:        BLOOD CULTURE RECEIVED NO GROWTH TO DATE CULTURE WILL BE HELD FOR 5 DAYS BEFORE ISSUING A FINAL NEGATIVE REPORT   Report Status PENDING   Incomplete   CULTURE, BLOOD (ROUTINE X 2)     Status: Normal (Preliminary result)   Collection Time   01/02/11  2:00 PM      Component Value Range  Status Comment   Specimen Description BLOOD LEFT HAND   Final    Special Requests BOTTLES DRAWN AEROBIC ONLY 3 CC   Final    Setup Time [id number]   Final    Culture     Final    Value:        BLOOD CULTURE RECEIVED NO GROWTH TO DATE CULTURE WILL BE HELD FOR 5 DAYS BEFORE ISSUING A FINAL NEGATIVE REPORT   Report Status PENDING   Incomplete   MRSA PCR SCREENING     Status: Normal   Collection Time   01/02/11  5:37 PM      Component Value Range Status Comment   MRSA by PCR NEGATIVE  NEGATIVE  Final   CULTURE, SPUTUM-ASSESSMENT     Status: Normal   Collection Time   01/02/11  7:10 PM      Component Value Range Status Comment   Specimen Description SPUTUM   Final    Special Requests NONE   Final    Sputum evaluation     Final    Value: THIS SPECIMEN IS ACCEPTABLE. RESPIRATORY CULTURE REPORT TO FOLLOW.   Report Status  01/02/2011 FINAL   Final   CULTURE, RESPIRATORY     Status: Normal   Collection Time   01/02/11  7:10 PM      Component Value Range Status Comment   Specimen Description SPUTUM   Final    Special Requests NONE   Final    Gram Stain     Final    Value: NO WBC SEEN     NO SQUAMOUS EPITHELIAL CELLS SEEN     RARE GRAM POSITIVE RODS   Culture NORMAL OROPHARYNGEAL FLORA   Final    Report Status 01/05/2011 FINAL   Final     Medical History: Past Medical History  Diagnosis Date  . CAD (coronary artery disease)     (2005 LAD 30-40% stenosis  followed by 60-65% focal stenosis.  The second diagonal had 80%   stenosis.  The circumflex had 85-90% mid stenosis and was stented with a  Taxus stent.  The obtuse marginal had 20-25% stenosis and obtuse   marginal-2 had 40-50% stenosis.  The right coronary artery had 40-50%  proximal stenosis and mid 75% stenosis), well-preserved ejection  fraction (50-55% )  . Obesity   . HTN (hypertension)   . Sleep apnea     using CPAP   . PUD (peptic ulcer disease)   . DM (diabetes mellitus)   . Prostate cancer 2001    Radiation   . Carotid  stenosis   . Sleep apnea   . History of colonic polyps   . Sigmoid diverticulosis     Hx of  . Radiation proctitis   . Bronchitis     Hx of  . Myocardial infarction     Medications:  Scheduled:    . budesonide  0.5 mg Nebulization BID  . cefTRIAXone (ROCEPHIN)  IV  1 g Intravenous Q24H  . cholestyramine light  4 g Oral Daily  . clopidogrel  75 mg Oral QAC breakfast  . cyanocobalamin  1,000 mcg Intramuscular Once  . vitamin B-12  500 mcg Oral Daily  . darbepoetin (ARANESP) injection - NON-DIALYSIS  60 mcg Subcutaneous Q Mon-1800  . Flora-Q  1 capsule Oral Daily  . guaiFENesin  600 mg Oral BID  . insulin aspart  0-15 Units Subcutaneous TID WC  . insulin aspart  0-5 Units Subcutaneous QHS  . metoprolol succinate  25 mg Oral BID  . pantoprazole  40 mg Oral Q1200  . simvastatin  40 mg Oral QHS  . sodium chloride  1,000 mL Intravenous Once  . tiotropium  18 mcg Inhalation Daily  . DISCONTD: aspirin  81 mg Oral Daily  . DISCONTD: azithromycin  500 mg Intravenous Q24H  . DISCONTD: ipratropium  0.5 mg Nebulization Q6H  . DISCONTD: ipratropium  0.5 mg Nebulization BID    Assessment: 78YOM with CKD associated anemia to begin treatment with darbepoetin alfa.  Per patient's weight of >100kg, will recommend weekly x 4 weeks, first dose this evening.  Iron panel 12/30 resulted with saturation of 4% --> pt needs iron supplementation.   Plan:  Aranesp SQ weekly x 4 doses.  Will hold dose if Hgb > 11 in patients with CKD. Begin ferrous sulfate 325mg  PO TID.  Will also order laxative of choice qhs prn for initiation of iron.  Clance Boll 01/06/2011,11:24 AM

## 2011-01-06 NOTE — Progress Notes (Signed)
Physical Therapy Treatment Patient Details Name: HILL MACKIE MRN: [id number] DOB: 21-Apr-1932 Today's Date: 01/06/2011 Time: 7829-5621 Charge: Elouise Munroe PT Assessment/Plan  PT - Assessment/Plan Comments on Treatment Session: Pt with occasional unsteadiness with gait requiring minA x2 for LOB with RW.  Encouraged pt to use RW upon return home for safety. PT Plan: Discharge plan remains appropriate;Frequency needs to be updated PT Frequency: Min 3X/week Follow Up Recommendations: Home health PT Equipment Recommended: Rolling walker with 5" wheels PT Goals  Acute Rehab PT Goals PT Goal: Supine/Side to Sit - Progress: Progressing toward goal PT Goal: Sit to Supine/Side - Progress: Progressing toward goal PT Goal: Sit to Stand - Progress: Progressing toward goal PT Goal: Stand to Sit - Progress: Progressing toward goal PT Goal: Ambulate - Progress: Progressing toward goal  PT Treatment Precautions/Restrictions  Precautions Precautions: Fall Restrictions Weight Bearing Restrictions: No Mobility (including Balance) Bed Mobility Bed Mobility: Yes Supine to Sit: 5: Supervision Supine to Sit Details (indicate cue type and reason): increased time Sit to Supine - Left: 5: Supervision Sit to Supine - Left Details (indicate cue type and reason): increased time Transfers Transfers: Yes Sit to Stand: 4: Min assist;With upper extremity assist;From bed Sit to Stand Details (indicate cue type and reason): min/guard, required use of hands Stand to Sit: To bed;4: Min assist;With upper extremity assist Stand to Sit Details: min/guard Ambulation/Gait Ambulation/Gait: Yes Ambulation/Gait Assistance: 4: Min assist Ambulation/Gait Assistance Details (indicate cue type and reason): minA x2 for LOB, occasional unsteadiness, SaO2 97% on 4L Ambulation Distance (Feet): 400 Feet Assistive device: Rolling walker Gait Pattern: Step-through pattern Gait velocity: slow cadence  Balance Balance Assessed:  Yes Static Standing Balance Single Leg Stance - Right Leg: 25  (seconds with 2 HHA) Single Leg Stance - Left Leg: 20  (seconds with 2 HHA) Exercise  General Exercises - Lower Extremity Hip ABduction/ADduction: AROM;Strengthening;Both;10 reps;Standing (with 2 HHA) Hip Flexion/Marching: AROM;Strengthening;Both;10 reps;Standing Heel Raises: AROM;Strengthening;15 reps;Standing End of Session PT - End of Session Patient left: in bed;with call bell in reach;with family/visitor present General Behavior During Session: [facility] for tasks performed Cognition: [facility] for tasks performed  Jacoba Cherney,KATHrine E 01/06/2011, 12:47 PM Pager: [phone]

## 2011-01-07 LAB — COMPREHENSIVE METABOLIC PANEL
ALT: 32 U/L (ref 0–53)
AST: 28 U/L (ref 0–37)
CO2: 24 mEq/L (ref 19–32)
Chloride: 109 mEq/L (ref 96–112)
Creatinine, Ser: 1.92 mg/dL — ABNORMAL HIGH (ref 0.50–1.35)
GFR calc non Af Amer: 32 mL/min — ABNORMAL LOW (ref >90)
Total Bilirubin: 0.2 mg/dL — ABNORMAL LOW (ref 0.3–1.2)

## 2011-01-07 LAB — DIFFERENTIAL
Basophils Absolute: 0 10*3/uL (ref 0.0–0.1)
Lymphocytes Relative: 24 % (ref 12–46)
Lymphs Abs: 1.5 10*3/uL (ref 0.7–4.0)
Monocytes Absolute: 0.6 10*3/uL (ref 0.1–1.0)
Neutro Abs: 4 10*3/uL (ref 1.7–7.7)

## 2011-01-07 LAB — CBC
HCT: 29.6 % — ABNORMAL LOW (ref 39.0–52.0)
Hemoglobin: 9.6 g/dL — ABNORMAL LOW (ref 13.0–17.0)
RBC: 3.71 MIL/uL — ABNORMAL LOW (ref 4.22–5.81)
RDW: 15.4 % (ref 11.5–15.5)
WBC: 6.2 10*3/uL (ref 4.0–10.5)

## 2011-01-07 LAB — GLUCOSE, CAPILLARY: Glucose-Capillary: 139 mg/dL — ABNORMAL HIGH (ref 70–99)

## 2011-01-07 MED ORDER — CYANOCOBALAMIN 500 MCG PO TABS: 500.0000 ug | ORAL_TABLET | Freq: Every day | ORAL | Status: AC

## 2011-01-07 MED ORDER — TIOTROPIUM BROMIDE MONOHYDRATE 18 MCG IN CAPS: 18.0000 ug | ORAL_CAPSULE | Freq: Every day | RESPIRATORY_TRACT | Status: DC

## 2011-01-07 MED ORDER — BUDESONIDE 0.5 MG/2ML IN SUSP: 0.5000 mg | Freq: Two times a day (BID) | RESPIRATORY_TRACT | Status: DC

## 2011-01-07 MED ORDER — LINAGLIPTIN 5 MG PO TABS: 5.0000 mg | ORAL_TABLET | Freq: Every day | ORAL | Status: DC

## 2011-01-07 MED ORDER — AMOXICILLIN-POT CLAVULANATE 875-125 MG PO TABS: 1.0000 | ORAL_TABLET | Freq: Two times a day (BID) | ORAL | Status: AC

## 2011-01-07 MED ORDER — FERROUS SULFATE 325 (65 FE) MG PO TABS: 325.0000 mg | ORAL_TABLET | Freq: Three times a day (TID) | ORAL | Status: DC

## 2011-01-07 MED ORDER — FLORA-Q PO CAPS: 1.0000 | ORAL_CAPSULE | Freq: Every day | ORAL | Status: DC

## 2011-01-07 NOTE — Discharge Summary (Signed)
Physician Discharge Summary  Patient ID: [name] [name] MRN: [id number] DOB/AGE: 03/22/1932 78 y.o.  Admit date: 01/02/2011 Discharge date: 01/07/2011  Admission Diagnoses:Pneumonia   Discharge Diagnoses:  Principal Problem:  *Pneumonia Active Problems:  DM type 2, uncontrolled, with renal complications  HYPERTENSION  CAD  SLEEP APNEA  Anemia-iron deficient with Heme positive stool with contribution of low b12 and ckd affecting hemoglobin Levels as well.  Has had several gi evals in even recent past.  Aspirin stopped this admit.  Acute renal failure0improved but still has ckd  Elevated troponin-no ACS though  Acute respiratory distress  COPD (chronic obstructive pulmonary disease)  Tobacco abuse   Discharged Condition: fair  Hospital Course: [name] [name] was admitted with pneumonia and copd exacerbation and some degree of arf.  Cardiology did not feel he was having acute coronary syndrome.  He was treated with rocephin and azithro and oxygen and breathing treatments and gentle iv fluid hydration. He gradually improved with these measures.  He was strongly counseled to stop smoking.  He has iron deficiency anemia and new found b12 deficiency. He was transfused two units of blood. Aspirin was stopped (forever) and he was given a dose of aranesp.  Hemoglobin low was 6.9 and on discharge is 9.6.  He had no sign of any brisk gi bleeding of any kind and has had many gi evals in past so gi was not consulted this admission.  By 01/07/11 he was better from a pulmonary standpoint and not requiring oxygen. He is discharged home in stable condition with his wife.  Consults: cardiology  Significant Diagnostic Studies: none  Treatments: IV hydration  Discharge Exam:  Blood pressure 145/58, pulse 68, temperature 98.3 F (36.8 C), temperature source Oral, resp. rate 20, height 5' 6.93" (1.7 m), weight 106.595 kg (235 lb), SpO2 93.00%.  Physical Exam:Sitting on side of bed in NAD.  CTA bilaterally with no  w/r/r.  RRR no sig m/r/g. abd soft, nt, no mass. No sig. Edema. Alert and oriented times 4.  Disposition:Discharge to Home   Current Discharge Medication List    CONTINUE these medications which have NOT CHANGED   Details       cholestyramine light (PREVALITE) 4 G packet Take 4 g by mouth daily.      clopidogrel (PLAVIX) 75 MG tablet Take 75 mg by mouth daily.      guaiFENesin (ROBITUSSIN) 100 MG/5ML liquid Take 200 mg by mouth 3 (three) times daily as needed.      metFORMIN (GLUCOPHAGE) 500 MG tablet Stop taking metformin. You can't take this any longer. You will be on tradjenta 5 mg daily. Check your sugar twice a day before breakfast and supper and keep a list of the numbers to show dr. Felipa [name] next week.      metoprolol succinate (TOPROL-XL) 25 MG 24 hr tablet Take 25 mg by mouth 2 (two) times daily.      omeprazole (PRILOSEC) 20 MG capsule Take 20 mg by mouth daily.  With food    simvastatin (ZOCOR) 40 MG tablet Take 40 mg by mouth at bedtime.      telmisartan-hydrochlorothiazide (MICARDIS HCT) 80-12.5 MG per tablet Take 1 tablet by mouth daily. Qty: 30 tablet, Refills: 11     Augmentin 875 mg one by mouth twice a day with food for 7 days  Take over the counter probiotic.  Align is a good choice. Take one daily  At lunchtime for 2-3 weeks then stop that  You will need a b12  shot 1000 micrograms at [facility] once a week for three weeks and then once a month thereafter to treat low b12 levels.  Spiriva one dose inhaled daily  ProAir HFA two puffs every 4 hours as needed for wheeze/cough or sob   Associated Diagnoses: Unspecified essential hypertension  Over the counter b12 500 micrograms. Take one pill daily long term.   Nebulizer machine and do pulmicort 0.5 mg nebulized twice a day.  Rinse mouth with water after use.  Otc Ferrous sulfate 325 mg once daily long term as an iron pill.  Tradjenta 5 mg once daily for diabetes.       Follow-up Information      Follow up with [name] Sauer, MD .  Come to [facility] in two days for cbc-diff and cmet. Call [phone] for visit with Dr. Felipa [name] in one week.         SignedRodrigo [name] A 01/07/2011, 12:36 PM

## 2011-01-07 NOTE — Progress Notes (Signed)
01-07-11 Received call from patient's nurse for home nebulizer machine that was ordered. Spoke with patient's wife to discuss dme agency. Chose [employer]. Informed patient's wife that patient had two choices to wait for confirmation that [facility] will deliver or dc home and have nebulizer delivered to home. Mrs Jafari states patient has his clothes on and is ready to discharge home asap. Spoke with [employer] representative who states they will be able to provide nebulizer machine today. All necessary information given over the phone along with faxed info to fax number [phone].  [place], [place] [phone]

## 2011-01-07 NOTE — Progress Notes (Signed)
Physical Therapy Treatment Patient Details Name: [name] [name] MRN: [id number] DOB: 07-20-1932 Today's Date: 01/07/2011 Time: 1120-1147  1G, 1 IPC PT Assessment/Plan  PT - Assessment/Plan Comments on Treatment Session: Pt progressing well. Able to ambulate without O2-sats remained above 90%. Pt/wife state they have access to RW if needed.  PT Plan: Discharge plan remains appropriate Follow Up Recommendations: Home health PT Equipment Recommended: (pt/wife state they have access to RW if needed) PT Goals  Acute Rehab PT Goals PT Goal: Sit to Stand - Progress: Progressing toward goal PT Goal: Stand to Sit - Progress: Met PT Goal: Ambulate - Progress: Progressing toward goal  PT Treatment Precautions/Restrictions  Precautions Precautions: Fall Restrictions Weight Bearing Restrictions: No Mobility (including Balance) Transfers Transfers: Yes Sit to Stand Details (indicate cue type and reason): Min-guard assist. Vcs safety Stand to Sit: 5: Supervision Stand to Sit Details: VCs safety. Ambulation/Gait Ambulation/Gait: Yes Ambulation/Gait Assistance Details (indicate cue type and reason): Min-guard assist. VCs safety. Pt used RW. Able to converse and ambulate using slow gait speed.  Ambulation Distance (Feet): 400 Feet Assistive device: Rolling walker Gait Pattern: Step-through pattern    Exercise    End of Session PT - End of Session Equipment Utilized During Treatment: Gait belt Activity Tolerance: Patient tolerated treatment well Patient left: in chair;with call bell in reach;with family/visitor present General Behavior During Session: [facility] for tasks performed Cognition: [facility] for tasks performed  Rebeca Alert [facility] 01/07/2011, 2:01 PM [phone]

## 2011-01-08 LAB — UIFE/LIGHT CHAINS/TP QN, 24-HR UR
Albumin, U: DETECTED
Alpha 2, Urine: DETECTED — AB
Beta, Urine: DETECTED — AB
Free Kappa/Lambda Ratio: 3.6 ratio (ref 2.04–10.37)
Total Protein, Urine: 62.7 mg/dL

## 2011-01-09 LAB — CULTURE, BLOOD (ROUTINE X 2)
Culture  Setup Time: 201212280042
Culture  Setup Time: 201212280042
Culture: NO GROWTH

## 2011-01-09 LAB — PROTEIN ELECTROPHORESIS, SERUM
Alpha-2-Globulin: 18.9 % — ABNORMAL HIGH (ref 7.1–11.8)
Beta 2: 7.9 % — ABNORMAL HIGH (ref 3.2–6.5)
Beta Globulin: 7.2 % (ref 4.7–7.2)
Gamma Globulin: 16.4 % (ref 11.1–18.8)
M-Spike, %: NOT DETECTED g/dL

## 2011-01-13 DIAGNOSIS — E119 Type 2 diabetes mellitus without complications: Secondary | ICD-10-CM | POA: Diagnosis not present

## 2011-01-14 DIAGNOSIS — J449 Chronic obstructive pulmonary disease, unspecified: Secondary | ICD-10-CM | POA: Diagnosis not present

## 2011-01-14 DIAGNOSIS — R310 Gross hematuria: Secondary | ICD-10-CM | POA: Diagnosis not present

## 2011-01-14 DIAGNOSIS — J189 Pneumonia, unspecified organism: Secondary | ICD-10-CM | POA: Diagnosis not present

## 2011-01-14 DIAGNOSIS — B370 Candidal stomatitis: Secondary | ICD-10-CM | POA: Diagnosis not present

## 2011-02-04 DIAGNOSIS — M79609 Pain in unspecified limb: Secondary | ICD-10-CM | POA: Diagnosis not present

## 2011-02-04 DIAGNOSIS — L03039 Cellulitis of unspecified toe: Secondary | ICD-10-CM | POA: Diagnosis not present

## 2011-02-04 DIAGNOSIS — L97509 Non-pressure chronic ulcer of other part of unspecified foot with unspecified severity: Secondary | ICD-10-CM | POA: Diagnosis not present

## 2011-02-04 DIAGNOSIS — L600 Ingrowing nail: Secondary | ICD-10-CM | POA: Diagnosis not present

## 2011-02-25 DIAGNOSIS — H6120 Impacted cerumen, unspecified ear: Secondary | ICD-10-CM | POA: Diagnosis not present

## 2011-02-25 DIAGNOSIS — H903 Sensorineural hearing loss, bilateral: Secondary | ICD-10-CM | POA: Diagnosis not present

## 2011-02-25 DIAGNOSIS — J449 Chronic obstructive pulmonary disease, unspecified: Secondary | ICD-10-CM | POA: Diagnosis not present

## 2011-02-25 DIAGNOSIS — R197 Diarrhea, unspecified: Secondary | ICD-10-CM | POA: Diagnosis not present

## 2011-02-25 DIAGNOSIS — E119 Type 2 diabetes mellitus without complications: Secondary | ICD-10-CM | POA: Diagnosis not present

## 2011-04-08 DIAGNOSIS — E1149 Type 2 diabetes mellitus with other diabetic neurological complication: Secondary | ICD-10-CM | POA: Diagnosis not present

## 2011-04-08 DIAGNOSIS — L851 Acquired keratosis [keratoderma] palmaris et plantaris: Secondary | ICD-10-CM | POA: Diagnosis not present

## 2011-04-08 DIAGNOSIS — E119 Type 2 diabetes mellitus without complications: Secondary | ICD-10-CM | POA: Diagnosis not present

## 2011-04-08 DIAGNOSIS — L609 Nail disorder, unspecified: Secondary | ICD-10-CM | POA: Diagnosis not present

## 2011-05-30 ENCOUNTER — Other Ambulatory Visit: Payer: Self-pay | Admitting: Cardiology

## 2011-05-30 NOTE — Telephone Encounter (Signed)
..   Requested Prescriptions   Pending Prescriptions Disp Refills  . MICARDIS HCT 80-12.5 MG per tablet [Pharmacy Med Name: MICARDIS HCT 80/12.5TAB] 30 each 7    Sig: TAKE ONE TABLET BY MOUTH DAILY.

## 2011-06-12 ENCOUNTER — Other Ambulatory Visit: Payer: Self-pay | Admitting: Cardiology

## 2011-06-12 DIAGNOSIS — I6529 Occlusion and stenosis of unspecified carotid artery: Secondary | ICD-10-CM

## 2011-06-16 ENCOUNTER — Encounter (INDEPENDENT_AMBULATORY_CARE_PROVIDER_SITE_OTHER): Payer: Medicare Other

## 2011-06-16 DIAGNOSIS — I6529 Occlusion and stenosis of unspecified carotid artery: Secondary | ICD-10-CM

## 2011-06-17 DIAGNOSIS — L851 Acquired keratosis [keratoderma] palmaris et plantaris: Secondary | ICD-10-CM | POA: Diagnosis not present

## 2011-06-17 DIAGNOSIS — L609 Nail disorder, unspecified: Secondary | ICD-10-CM | POA: Diagnosis not present

## 2011-06-17 DIAGNOSIS — E1149 Type 2 diabetes mellitus with other diabetic neurological complication: Secondary | ICD-10-CM | POA: Diagnosis not present

## 2011-06-17 DIAGNOSIS — E119 Type 2 diabetes mellitus without complications: Secondary | ICD-10-CM | POA: Diagnosis not present

## 2011-06-20 DIAGNOSIS — E119 Type 2 diabetes mellitus without complications: Secondary | ICD-10-CM | POA: Diagnosis not present

## 2011-06-20 DIAGNOSIS — J449 Chronic obstructive pulmonary disease, unspecified: Secondary | ICD-10-CM | POA: Diagnosis not present

## 2011-06-20 DIAGNOSIS — I10 Essential (primary) hypertension: Secondary | ICD-10-CM | POA: Diagnosis not present

## 2011-08-26 DIAGNOSIS — E119 Type 2 diabetes mellitus without complications: Secondary | ICD-10-CM | POA: Diagnosis not present

## 2011-08-26 DIAGNOSIS — L851 Acquired keratosis [keratoderma] palmaris et plantaris: Secondary | ICD-10-CM | POA: Diagnosis not present

## 2011-08-26 DIAGNOSIS — L609 Nail disorder, unspecified: Secondary | ICD-10-CM | POA: Diagnosis not present

## 2011-08-26 DIAGNOSIS — E1149 Type 2 diabetes mellitus with other diabetic neurological complication: Secondary | ICD-10-CM | POA: Diagnosis not present

## 2011-09-30 DIAGNOSIS — Z23 Encounter for immunization: Secondary | ICD-10-CM | POA: Diagnosis not present

## 2011-10-09 ENCOUNTER — Other Ambulatory Visit: Payer: Self-pay | Admitting: Cardiology

## 2011-10-09 NOTE — Telephone Encounter (Signed)
..   Requested Prescriptions   Pending Prescriptions Disp Refills  . metoprolol succinate (TOPROL-XL) 25 MG 24 hr tablet [Pharmacy Med Name: METOPROLOL ER 25MG   TAB] 60 tablet 10    Sig: TAKE ONE TABLET BY MOUTH TWICE DAILY

## 2011-11-04 DIAGNOSIS — L609 Nail disorder, unspecified: Secondary | ICD-10-CM | POA: Diagnosis not present

## 2011-11-04 DIAGNOSIS — L851 Acquired keratosis [keratoderma] palmaris et plantaris: Secondary | ICD-10-CM | POA: Diagnosis not present

## 2011-11-04 DIAGNOSIS — E119 Type 2 diabetes mellitus without complications: Secondary | ICD-10-CM | POA: Diagnosis not present

## 2011-11-04 DIAGNOSIS — E1149 Type 2 diabetes mellitus with other diabetic neurological complication: Secondary | ICD-10-CM | POA: Diagnosis not present

## 2011-11-06 DIAGNOSIS — Z1331 Encounter for screening for depression: Secondary | ICD-10-CM | POA: Diagnosis not present

## 2011-11-06 DIAGNOSIS — J449 Chronic obstructive pulmonary disease, unspecified: Secondary | ICD-10-CM | POA: Diagnosis not present

## 2011-11-06 DIAGNOSIS — E119 Type 2 diabetes mellitus without complications: Secondary | ICD-10-CM | POA: Diagnosis not present

## 2011-12-01 DIAGNOSIS — E119 Type 2 diabetes mellitus without complications: Secondary | ICD-10-CM | POA: Diagnosis not present

## 2011-12-18 DIAGNOSIS — I10 Essential (primary) hypertension: Secondary | ICD-10-CM | POA: Diagnosis not present

## 2011-12-18 DIAGNOSIS — E1165 Type 2 diabetes mellitus with hyperglycemia: Secondary | ICD-10-CM | POA: Diagnosis not present

## 2011-12-18 DIAGNOSIS — E1129 Type 2 diabetes mellitus with other diabetic kidney complication: Secondary | ICD-10-CM | POA: Diagnosis not present

## 2012-01-05 ENCOUNTER — Encounter (INDEPENDENT_AMBULATORY_CARE_PROVIDER_SITE_OTHER): Payer: Medicare Other

## 2012-01-05 DIAGNOSIS — R42 Dizziness and giddiness: Secondary | ICD-10-CM

## 2012-01-05 DIAGNOSIS — I6529 Occlusion and stenosis of unspecified carotid artery: Secondary | ICD-10-CM

## 2012-01-20 DIAGNOSIS — L851 Acquired keratosis [keratoderma] palmaris et plantaris: Secondary | ICD-10-CM | POA: Diagnosis not present

## 2012-01-20 DIAGNOSIS — L609 Nail disorder, unspecified: Secondary | ICD-10-CM | POA: Diagnosis not present

## 2012-01-20 DIAGNOSIS — E1149 Type 2 diabetes mellitus with other diabetic neurological complication: Secondary | ICD-10-CM | POA: Diagnosis not present

## 2012-01-20 DIAGNOSIS — E119 Type 2 diabetes mellitus without complications: Secondary | ICD-10-CM | POA: Diagnosis not present

## 2012-01-27 ENCOUNTER — Encounter: Payer: Self-pay | Admitting: Cardiology

## 2012-01-27 ENCOUNTER — Ambulatory Visit (INDEPENDENT_AMBULATORY_CARE_PROVIDER_SITE_OTHER): Payer: Medicare Other | Admitting: Cardiology

## 2012-01-27 VITALS — BP 136/74 | HR 58 | Ht 66.0 in | Wt 225.0 lb

## 2012-01-27 DIAGNOSIS — I10 Essential (primary) hypertension: Secondary | ICD-10-CM | POA: Diagnosis not present

## 2012-01-27 DIAGNOSIS — I2510 Atherosclerotic heart disease of native coronary artery without angina pectoris: Secondary | ICD-10-CM | POA: Diagnosis not present

## 2012-01-27 DIAGNOSIS — N179 Acute kidney failure, unspecified: Secondary | ICD-10-CM | POA: Diagnosis not present

## 2012-01-27 DIAGNOSIS — F17200 Nicotine dependence, unspecified, uncomplicated: Secondary | ICD-10-CM

## 2012-01-27 DIAGNOSIS — R0989 Other specified symptoms and signs involving the circulatory and respiratory systems: Secondary | ICD-10-CM

## 2012-01-27 DIAGNOSIS — E785 Hyperlipidemia, unspecified: Secondary | ICD-10-CM

## 2012-01-27 NOTE — Patient Instructions (Addendum)
Your physician wants you to follow-up in: ONE YEAR WITH DR HOCHREIN You will receive a reminder letter in the mail two months in advance. If you don't receive a letter, please call our office to schedule the follow-up appointment.  

## 2012-01-27 NOTE — Progress Notes (Signed)
HPI    The patient presents for followup of his known coronary disease.  Since I ast saw him he has had no new cardiovascular complaints. The patient denies any new symptoms such as chest discomfort, neck or arm discomfort. There has been no new shortness of breath, PND or orthopnea. There have been no reported palpitations, presyncope or syncope.  He doesn't exercise he walks 250 yards to his mailbox most days. He unfortunately continues to smoke cigarettes. He was hospitalized late last year with pneumonia and I reviewed these records .I don't see A recent lipid profile but he says this is done by  Hoyle Sauer, MD  No Known Allergies  Current Outpatient Prescriptions  Medication Sig Dispense Refill  . cholestyramine light (PREVALITE) 4 G packet Take 4 g by mouth daily.        . clopidogrel (PLAVIX) 75 MG tablet Take 75 mg by mouth daily.        Donald Prose (FLORA-Q) CAPS Take 1 capsule by mouth daily.  30 capsule  0  . guaiFENesin (ROBITUSSIN) 100 MG/5ML liquid Take 200 mg by mouth 3 (three) times daily as needed.        . linagliptin (TRADJENTA) 5 MG TABS tablet Take 1 tablet (5 mg total) by mouth daily.  30 tablet  11  . metoprolol succinate (TOPROL-XL) 25 MG 24 hr tablet Take 25 mg by mouth 2 (two) times daily.        Marland Kitchen MICARDIS HCT 80-12.5 MG per tablet TAKE ONE TABLET BY MOUTH DAILY.  30 each  7  . omeprazole (PRILOSEC) 20 MG capsule Take 20 mg by mouth daily.        . simvastatin (ZOCOR) 40 MG tablet Take 40 mg by mouth at bedtime.        . budesonide (PULMICORT) 0.5 MG/2ML nebulizer solution Take 2 mLs (0.5 mg total) by nebulization 2 (two) times daily.  120 mL  11  . ferrous sulfate 325 (65 FE) MG tablet Take 1 tablet (325 mg total) by mouth 3 (three) times daily with meals. Take one tablet daily  30 tablet  11  . tiotropium (SPIRIVA) 18 MCG inhalation capsule Place 1 capsule (18 mcg total) into inhaler and inhale daily.  30 capsule  11    Past Medical History  Diagnosis Date   . CAD (coronary artery disease)     (2005 LAD 30-40% stenosis  followed by 60-65% focal stenosis.  The second diagonal had 80%   stenosis.  The circumflex had 85-90% mid stenosis and was stented with a  Taxus stent.  The obtuse marginal had 20-25% stenosis and obtuse   marginal-2 had 40-50% stenosis.  The right coronary artery had 40-50%  proximal stenosis and mid 75% stenosis), well-preserved ejection  fraction (50-55% )  . Obesity   . HTN (hypertension)   . Sleep apnea     using CPAP   . PUD (peptic ulcer disease)   . DM (diabetes mellitus)   . Prostate cancer 2001    Radiation   . Carotid stenosis   . Sleep apnea   . History of colonic polyps   . Sigmoid diverticulosis     Hx of  . Radiation proctitis   . Bronchitis     Hx of  . Myocardial infarction     Past Surgical History  Procedure Date  . Cholecystectomy   . Left arm repair 1994  . Hernia repair     right inguinal  .  Cardiac catheterization 12/13/2003    ptca, stent x 2  . Colonoscopy 05/11/2000  . Colonoscopy w/ polypectomy     ROS:  As stated in the HPI and negative for all other systems.  PHYSICAL EXAM BP 136/74  Pulse 58  Ht 5\' 6"  (1.676 m)  Wt 225 lb (102.059 kg)  BMI 36.32 kg/m2 GENERAL:  Well appearing HEENT:  Pupils equal round and reactive, fundi not visualized, oral mucosa unremarkable NECK:  No jugular venous distention, waveform within normal limits, carotid upstroke brisk and symmetric, no bruits, no thyromegaly LYMPHATICS:  No cervical, inguinal adenopathy LUNGS:  Clear to auscultation bilaterally BACK:  No CVA tenderness CHEST:  Unremarkable HEART:  PMI not displaced or sustained,S1 and S2 within normal limits, no S3, no S4, no clicks, no rubs, no murmurs ABD:  Flat, positive bowel sounds normal in frequency in pitch, no bruits, no rebound, no guarding, no midline pulsatile mass, no hepatomegaly, no splenomegaly, obese EXT:  2 plus pulses upper, diminished bilateral lower, no edema, no cyanosis  no clubbing, left arm s/p trauma SKIN:  No rashes no nodules NEURO:  Cranial nerves II through XII grossly intact, motor grossly intact throughout [facility]:  Cognitively intact, oriented to person place and time  EKG:  Sinus rhythm, right bundle branch block, rate 58, no acute ST-T wave changes no change from previous 01/27/2012   A/P  CAD -  He has no new symptoms since his last stress test. No further cardiovascular testing is suggested. I would like to participate more in lifestyle changes and risk reduction. Of note he doesn't tolerate aspirin and will stay on Plavix.    TOBACCO ABUSE -  We again talked at length about stopping smoking and he doesn't think he can do this  DYSLIPIDEMIA -  By current guidelines he is not on target does that. However, he's been sensitive to medications. He tolerates the current simvastatin. I will defer management to Dr.  Felipa Eth  Carotid bruit -  He is up-to-date with carotid Dopplers though stenosis on the has 60-79% stenosis on the right and less than this on the left. I reviewed results from last December. He will have this repeated next December.   HYPERTENSION -  The blood pressure is at target. No change in medications is indicated. We will continue with therapeutic lifestyle changes (TLC).

## 2012-02-06 ENCOUNTER — Ambulatory Visit: Payer: Medicare Other | Admitting: Cardiology

## 2012-03-26 DIAGNOSIS — D509 Iron deficiency anemia, unspecified: Secondary | ICD-10-CM | POA: Diagnosis not present

## 2012-03-26 DIAGNOSIS — G4733 Obstructive sleep apnea (adult) (pediatric): Secondary | ICD-10-CM | POA: Diagnosis not present

## 2012-03-26 DIAGNOSIS — J449 Chronic obstructive pulmonary disease, unspecified: Secondary | ICD-10-CM | POA: Diagnosis not present

## 2012-03-26 DIAGNOSIS — C61 Malignant neoplasm of prostate: Secondary | ICD-10-CM | POA: Diagnosis not present

## 2012-03-26 DIAGNOSIS — E785 Hyperlipidemia, unspecified: Secondary | ICD-10-CM | POA: Diagnosis not present

## 2012-03-26 DIAGNOSIS — F17200 Nicotine dependence, unspecified, uncomplicated: Secondary | ICD-10-CM | POA: Diagnosis not present

## 2012-03-26 DIAGNOSIS — E1165 Type 2 diabetes mellitus with hyperglycemia: Secondary | ICD-10-CM | POA: Diagnosis not present

## 2012-03-26 DIAGNOSIS — E1129 Type 2 diabetes mellitus with other diabetic kidney complication: Secondary | ICD-10-CM | POA: Diagnosis not present

## 2012-03-26 DIAGNOSIS — I2510 Atherosclerotic heart disease of native coronary artery without angina pectoris: Secondary | ICD-10-CM | POA: Diagnosis not present

## 2012-03-26 DIAGNOSIS — I10 Essential (primary) hypertension: Secondary | ICD-10-CM | POA: Diagnosis not present

## 2012-04-02 DIAGNOSIS — E1149 Type 2 diabetes mellitus with other diabetic neurological complication: Secondary | ICD-10-CM | POA: Diagnosis not present

## 2012-04-02 DIAGNOSIS — E119 Type 2 diabetes mellitus without complications: Secondary | ICD-10-CM | POA: Diagnosis not present

## 2012-04-02 DIAGNOSIS — L609 Nail disorder, unspecified: Secondary | ICD-10-CM | POA: Diagnosis not present

## 2012-04-02 DIAGNOSIS — L851 Acquired keratosis [keratoderma] palmaris et plantaris: Secondary | ICD-10-CM | POA: Diagnosis not present

## 2012-06-15 DIAGNOSIS — L851 Acquired keratosis [keratoderma] palmaris et plantaris: Secondary | ICD-10-CM | POA: Diagnosis not present

## 2012-06-15 DIAGNOSIS — E1149 Type 2 diabetes mellitus with other diabetic neurological complication: Secondary | ICD-10-CM | POA: Diagnosis not present

## 2012-06-15 DIAGNOSIS — B351 Tinea unguium: Secondary | ICD-10-CM | POA: Diagnosis not present

## 2012-08-26 DIAGNOSIS — I10 Essential (primary) hypertension: Secondary | ICD-10-CM | POA: Diagnosis not present

## 2012-08-26 DIAGNOSIS — I2510 Atherosclerotic heart disease of native coronary artery without angina pectoris: Secondary | ICD-10-CM | POA: Diagnosis not present

## 2012-08-26 DIAGNOSIS — E785 Hyperlipidemia, unspecified: Secondary | ICD-10-CM | POA: Diagnosis not present

## 2012-08-26 DIAGNOSIS — E1129 Type 2 diabetes mellitus with other diabetic kidney complication: Secondary | ICD-10-CM | POA: Diagnosis not present

## 2012-08-26 DIAGNOSIS — F17200 Nicotine dependence, unspecified, uncomplicated: Secondary | ICD-10-CM | POA: Diagnosis not present

## 2012-08-26 DIAGNOSIS — D509 Iron deficiency anemia, unspecified: Secondary | ICD-10-CM | POA: Diagnosis not present

## 2012-08-26 DIAGNOSIS — J449 Chronic obstructive pulmonary disease, unspecified: Secondary | ICD-10-CM | POA: Diagnosis not present

## 2012-09-14 DIAGNOSIS — B351 Tinea unguium: Secondary | ICD-10-CM | POA: Diagnosis not present

## 2012-09-14 DIAGNOSIS — L851 Acquired keratosis [keratoderma] palmaris et plantaris: Secondary | ICD-10-CM | POA: Diagnosis not present

## 2012-09-14 DIAGNOSIS — E1149 Type 2 diabetes mellitus with other diabetic neurological complication: Secondary | ICD-10-CM | POA: Diagnosis not present

## 2012-09-28 ENCOUNTER — Other Ambulatory Visit: Payer: Self-pay

## 2012-09-28 DIAGNOSIS — Z23 Encounter for immunization: Secondary | ICD-10-CM | POA: Diagnosis not present

## 2012-09-28 MED ORDER — METOPROLOL SUCCINATE ER 25 MG PO TB24: 25.0000 mg | ORAL_TABLET | Freq: Two times a day (BID) | ORAL | Status: DC

## 2012-11-23 DIAGNOSIS — B351 Tinea unguium: Secondary | ICD-10-CM | POA: Diagnosis not present

## 2012-11-23 DIAGNOSIS — L851 Acquired keratosis [keratoderma] palmaris et plantaris: Secondary | ICD-10-CM | POA: Diagnosis not present

## 2012-11-23 DIAGNOSIS — E1149 Type 2 diabetes mellitus with other diabetic neurological complication: Secondary | ICD-10-CM | POA: Diagnosis not present

## 2012-12-07 DIAGNOSIS — H35379 Puckering of macula, unspecified eye: Secondary | ICD-10-CM | POA: Diagnosis not present

## 2012-12-07 DIAGNOSIS — E119 Type 2 diabetes mellitus without complications: Secondary | ICD-10-CM | POA: Diagnosis not present

## 2012-12-28 ENCOUNTER — Other Ambulatory Visit: Payer: Self-pay

## 2012-12-28 MED ORDER — METOPROLOL SUCCINATE ER 25 MG PO TB24: 25.0000 mg | ORAL_TABLET | Freq: Two times a day (BID) | ORAL | Status: DC

## 2013-01-04 ENCOUNTER — Other Ambulatory Visit: Payer: Self-pay

## 2013-01-04 MED ORDER — METOPROLOL SUCCINATE ER 25 MG PO TB24: 25.0000 mg | ORAL_TABLET | Freq: Two times a day (BID) | ORAL | Status: DC

## 2013-01-28 ENCOUNTER — Ambulatory Visit (INDEPENDENT_AMBULATORY_CARE_PROVIDER_SITE_OTHER): Payer: Medicare Other | Admitting: Cardiology

## 2013-01-28 ENCOUNTER — Encounter: Payer: Self-pay | Admitting: Cardiology

## 2013-01-28 ENCOUNTER — Ambulatory Visit (HOSPITAL_COMMUNITY): Payer: Medicare Other | Attending: Cardiology

## 2013-01-28 VITALS — BP 160/86 | HR 64 | Ht 66.0 in | Wt 229.4 lb

## 2013-01-28 DIAGNOSIS — R0989 Other specified symptoms and signs involving the circulatory and respiratory systems: Secondary | ICD-10-CM

## 2013-01-28 DIAGNOSIS — E785 Hyperlipidemia, unspecified: Secondary | ICD-10-CM | POA: Insufficient documentation

## 2013-01-28 DIAGNOSIS — I6529 Occlusion and stenosis of unspecified carotid artery: Secondary | ICD-10-CM

## 2013-01-28 DIAGNOSIS — F17200 Nicotine dependence, unspecified, uncomplicated: Secondary | ICD-10-CM | POA: Diagnosis not present

## 2013-01-28 DIAGNOSIS — I10 Essential (primary) hypertension: Secondary | ICD-10-CM | POA: Insufficient documentation

## 2013-01-28 DIAGNOSIS — I658 Occlusion and stenosis of other precerebral arteries: Secondary | ICD-10-CM | POA: Insufficient documentation

## 2013-01-28 DIAGNOSIS — I2510 Atherosclerotic heart disease of native coronary artery without angina pectoris: Secondary | ICD-10-CM | POA: Diagnosis not present

## 2013-01-28 DIAGNOSIS — J449 Chronic obstructive pulmonary disease, unspecified: Secondary | ICD-10-CM | POA: Insufficient documentation

## 2013-01-28 DIAGNOSIS — J4489 Other specified chronic obstructive pulmonary disease: Secondary | ICD-10-CM | POA: Insufficient documentation

## 2013-01-28 DIAGNOSIS — E119 Type 2 diabetes mellitus without complications: Secondary | ICD-10-CM | POA: Insufficient documentation

## 2013-01-28 NOTE — Progress Notes (Signed)
HPI    The patient presents for followup of his known coronary disease.  Since I ast saw him he has had no new cardiovascular complaints. The patient denies any new symptoms such as chest discomfort, neck or arm discomfort. There has been no new shortness of breath, PND or orthopnea. There have been no reported palpitations, presyncope or syncope.  He doesn't exercise he walks 100 yards to his mailbox most days. He unfortunately continues to smoke cigarettes.   No Known Allergies  Current Outpatient Prescriptions  Medication Sig Dispense Refill  . cholestyramine light (PREVALITE) 4 G packet Take 4 g by mouth daily.        . clopidogrel (PLAVIX) 75 MG tablet Take 75 mg by mouth daily.        . ferrous sulfate 325 (65 FE) MG EC tablet Take 325 mg by mouth 3 (three) times daily with meals.      . irbesartan-hydrochlorothiazide (AVALIDE) 300-12.5 MG per tablet Take 1 tablet by mouth daily.      . metoprolol succinate (TOPROL-XL) 25 MG 24 hr tablet Take 1 tablet (25 mg total) by mouth 2 (two) times daily.  60 tablet  0  . omeprazole (PRILOSEC) 20 MG capsule Take 20 mg by mouth daily.        . saxagliptin HCl (ONGLYZA) 5 MG TABS tablet Take by mouth daily.      . simvastatin (ZOCOR) 40 MG tablet Take 40 mg by mouth at bedtime.        . budesonide (PULMICORT) 0.5 MG/2ML nebulizer solution Take 2 mLs (0.5 mg total) by nebulization 2 (two) times daily.  120 mL  11  . ferrous sulfate 325 (65 FE) MG tablet Take 1 tablet (325 mg total) by mouth 3 (three) times daily with meals. Take one tablet daily  30 tablet  11  . tiotropium (SPIRIVA) 18 MCG inhalation capsule Place 1 capsule (18 mcg total) into inhaler and inhale daily.  30 capsule  11   No current facility-administered medications for this visit.    Past Medical History  Diagnosis Date  . CAD (coronary artery disease)     (2005 LAD 30-40% stenosis  followed by 60-65% focal stenosis.  The second diagonal had 80%   stenosis.  The circumflex had  85-90% mid stenosis and was stented with a  Taxus stent.  The obtuse marginal had 20-25% stenosis and obtuse   marginal-2 had 40-50% stenosis.  The right coronary artery had 40-50%  proximal stenosis and mid 75% stenosis), well-preserved ejection  fraction (50-55% )  . Obesity   . HTN (hypertension)   . Sleep apnea     using CPAP   . PUD (peptic ulcer disease)   . DM (diabetes mellitus)   . Prostate cancer 2001    Radiation   . Carotid stenosis   . Sleep apnea   . History of colonic polyps   . Sigmoid diverticulosis     Hx of  . Radiation proctitis   . Bronchitis     Hx of  . Myocardial infarction     Past Surgical History  Procedure Laterality Date  . Cholecystectomy    . Left arm repair  1994  . Hernia repair      right inguinal  . Cardiac catheterization  12/13/2003    ptca, stent x 2  . Colonoscopy  05/11/2000  . Colonoscopy w/ polypectomy      ROS:  As stated in the HPI and negative for  all other systems.  PHYSICAL EXAM BP 160/86  Pulse 64  Ht 5\' 6"  (1.676 m)  Wt 229 lb 6.4 oz (104.055 kg)  BMI 37.04 kg/m2 GENERAL:  Well appearing HEENT:  Pupils equal round and reactive, fundi not visualized, oral mucosa unremarkable NECK:  No jugular venous distention, waveform within normal limits, carotid upstroke brisk and symmetric, no bruits, no thyromegaly LYMPHATICS:  No cervical, inguinal adenopathy LUNGS:  Clear to auscultation bilaterally BACK:  No CVA tenderness CHEST:  Unremarkable HEART:  PMI not displaced or sustained,S1 and S2 within normal limits, no S3, no S4, no clicks, no rubs, no murmurs ABD:  Flat, positive bowel sounds normal in frequency in pitch, no bruits, no rebound, no guarding, no midline pulsatile mass, no hepatomegaly, no splenomegaly, obese EXT:  2 plus pulses upper, diminished bilateral lower, no edema, no cyanosis no clubbing, left arm s/p trauma   EKG:  Sinus rhythm, right bundle branch block, and first degree AV block, premature atrial  contraction, rate 64and at the, no acute ST-T wave changes no change from previous 01/28/2013   A/P  CAD -  He has no new symptoms since his last stress test in 2012. No further cardiovascular testing is suggested. I would like to participate more in lifestyle changes and risk reduction. Of note he doesn't tolerate aspirin and will stay on Plavix.    TOBACCO ABUSE -  We again talked at length about stopping smoking and he doesn't think he can do this.  He says that he will "smoke until I die."  DYSLIPIDEMIA -  He's been sensitive to medications. He tolerates the current simvastatin. I will defer management to Dr.  Dagmar Hait  Carotid bruit -  He is overdue for carotid Dopplers though stenosis on the has 60-79% stenosis on the right and less than this on the left. I have convinced him to followup with this.  HYPERTENSION -  His blood pressure is elevated which is somewhat unusual.  I have instructed the patient to record a blood pressure diary and recording this. This will be presented for my review and pending these results I will make further suggestions about changes in therapy for optimal blood pressure control.

## 2013-01-28 NOTE — Patient Instructions (Signed)

## 2013-02-01 DIAGNOSIS — L851 Acquired keratosis [keratoderma] palmaris et plantaris: Secondary | ICD-10-CM | POA: Diagnosis not present

## 2013-02-01 DIAGNOSIS — B351 Tinea unguium: Secondary | ICD-10-CM | POA: Diagnosis not present

## 2013-02-01 DIAGNOSIS — E1149 Type 2 diabetes mellitus with other diabetic neurological complication: Secondary | ICD-10-CM | POA: Diagnosis not present

## 2013-02-28 ENCOUNTER — Other Ambulatory Visit: Payer: Self-pay | Admitting: Cardiology

## 2013-02-28 DIAGNOSIS — E1129 Type 2 diabetes mellitus with other diabetic kidney complication: Secondary | ICD-10-CM | POA: Diagnosis not present

## 2013-02-28 DIAGNOSIS — I2510 Atherosclerotic heart disease of native coronary artery without angina pectoris: Secondary | ICD-10-CM | POA: Diagnosis not present

## 2013-02-28 DIAGNOSIS — N183 Chronic kidney disease, stage 3 unspecified: Secondary | ICD-10-CM | POA: Diagnosis not present

## 2013-02-28 DIAGNOSIS — D509 Iron deficiency anemia, unspecified: Secondary | ICD-10-CM | POA: Diagnosis not present

## 2013-02-28 DIAGNOSIS — M1990 Unspecified osteoarthritis, unspecified site: Secondary | ICD-10-CM | POA: Diagnosis not present

## 2013-02-28 DIAGNOSIS — G4733 Obstructive sleep apnea (adult) (pediatric): Secondary | ICD-10-CM | POA: Diagnosis not present

## 2013-02-28 DIAGNOSIS — J449 Chronic obstructive pulmonary disease, unspecified: Secondary | ICD-10-CM | POA: Diagnosis not present

## 2013-02-28 DIAGNOSIS — I10 Essential (primary) hypertension: Secondary | ICD-10-CM | POA: Diagnosis not present

## 2013-03-09 ENCOUNTER — Telehealth: Payer: Self-pay | Admitting: *Deleted

## 2013-03-09 MED ORDER — AMLODIPINE BESYLATE 2.5 MG PO TABS: 2.5000 mg | ORAL_TABLET | Freq: Every day | ORAL | Status: DC

## 2013-03-09 NOTE — Telephone Encounter (Signed)
Spoke with pt about his BP readings.  Dr Percival Spanish reviewed and wants the pt to start Amlodipine 2.5 mg once a day.  He states understanding and will continue to monitor his BP.

## 2013-03-29 ENCOUNTER — Telehealth: Payer: Self-pay | Admitting: Cardiology

## 2013-03-29 NOTE — Telephone Encounter (Signed)
Walk in pt Form" BP Readings" Dropped off will Give to Pam/Hochrein Wednesday 3.25

## 2013-04-19 DIAGNOSIS — E1149 Type 2 diabetes mellitus with other diabetic neurological complication: Secondary | ICD-10-CM | POA: Diagnosis not present

## 2013-04-19 DIAGNOSIS — L851 Acquired keratosis [keratoderma] palmaris et plantaris: Secondary | ICD-10-CM | POA: Diagnosis not present

## 2013-04-19 DIAGNOSIS — B351 Tinea unguium: Secondary | ICD-10-CM | POA: Diagnosis not present

## 2013-06-07 DIAGNOSIS — H52229 Regular astigmatism, unspecified eye: Secondary | ICD-10-CM | POA: Diagnosis not present

## 2013-06-07 DIAGNOSIS — H5200 Hypermetropia, unspecified eye: Secondary | ICD-10-CM | POA: Diagnosis not present

## 2013-06-07 DIAGNOSIS — H35379 Puckering of macula, unspecified eye: Secondary | ICD-10-CM | POA: Diagnosis not present

## 2013-06-07 DIAGNOSIS — H353290 Exudative age-related macular degeneration, unspecified eye, stage unspecified: Secondary | ICD-10-CM | POA: Diagnosis not present

## 2013-06-28 DIAGNOSIS — E1149 Type 2 diabetes mellitus with other diabetic neurological complication: Secondary | ICD-10-CM | POA: Diagnosis not present

## 2013-06-28 DIAGNOSIS — L851 Acquired keratosis [keratoderma] palmaris et plantaris: Secondary | ICD-10-CM | POA: Diagnosis not present

## 2013-06-28 DIAGNOSIS — B351 Tinea unguium: Secondary | ICD-10-CM | POA: Diagnosis not present

## 2013-08-04 ENCOUNTER — Encounter (HOSPITAL_COMMUNITY): Payer: Medicare Other

## 2013-08-04 ENCOUNTER — Other Ambulatory Visit (HOSPITAL_COMMUNITY): Payer: Self-pay | Admitting: Cardiology

## 2013-08-04 DIAGNOSIS — I6529 Occlusion and stenosis of unspecified carotid artery: Secondary | ICD-10-CM

## 2013-08-09 ENCOUNTER — Ambulatory Visit (HOSPITAL_COMMUNITY): Payer: Medicare Other | Attending: Cardiology | Admitting: Cardiology

## 2013-08-09 DIAGNOSIS — I6529 Occlusion and stenosis of unspecified carotid artery: Secondary | ICD-10-CM | POA: Diagnosis not present

## 2013-08-09 NOTE — Progress Notes (Signed)
Carotid duplex performed 

## 2013-08-31 ENCOUNTER — Other Ambulatory Visit: Payer: Self-pay

## 2013-08-31 MED ORDER — METOPROLOL SUCCINATE ER 25 MG PO TB24: ORAL_TABLET | ORAL | Status: DC

## 2013-09-05 DIAGNOSIS — N183 Chronic kidney disease, stage 3 unspecified: Secondary | ICD-10-CM | POA: Diagnosis not present

## 2013-09-05 DIAGNOSIS — D509 Iron deficiency anemia, unspecified: Secondary | ICD-10-CM | POA: Diagnosis not present

## 2013-09-05 DIAGNOSIS — E1129 Type 2 diabetes mellitus with other diabetic kidney complication: Secondary | ICD-10-CM | POA: Diagnosis not present

## 2013-09-05 DIAGNOSIS — Z6835 Body mass index (BMI) 35.0-35.9, adult: Secondary | ICD-10-CM | POA: Diagnosis not present

## 2013-09-05 DIAGNOSIS — Z1331 Encounter for screening for depression: Secondary | ICD-10-CM | POA: Diagnosis not present

## 2013-09-05 DIAGNOSIS — I10 Essential (primary) hypertension: Secondary | ICD-10-CM | POA: Diagnosis not present

## 2013-09-05 DIAGNOSIS — R809 Proteinuria, unspecified: Secondary | ICD-10-CM | POA: Diagnosis not present

## 2013-09-06 DIAGNOSIS — B351 Tinea unguium: Secondary | ICD-10-CM | POA: Diagnosis not present

## 2013-09-06 DIAGNOSIS — E1149 Type 2 diabetes mellitus with other diabetic neurological complication: Secondary | ICD-10-CM | POA: Diagnosis not present

## 2013-09-06 DIAGNOSIS — L851 Acquired keratosis [keratoderma] palmaris et plantaris: Secondary | ICD-10-CM | POA: Diagnosis not present

## 2013-09-19 DIAGNOSIS — E1129 Type 2 diabetes mellitus with other diabetic kidney complication: Secondary | ICD-10-CM | POA: Diagnosis not present

## 2013-09-19 DIAGNOSIS — Z6834 Body mass index (BMI) 34.0-34.9, adult: Secondary | ICD-10-CM | POA: Diagnosis not present

## 2013-09-19 DIAGNOSIS — I10 Essential (primary) hypertension: Secondary | ICD-10-CM | POA: Diagnosis not present

## 2013-10-04 DIAGNOSIS — M25569 Pain in unspecified knee: Secondary | ICD-10-CM | POA: Diagnosis not present

## 2013-10-04 DIAGNOSIS — Z6835 Body mass index (BMI) 35.0-35.9, adult: Secondary | ICD-10-CM | POA: Diagnosis not present

## 2013-10-04 DIAGNOSIS — E1165 Type 2 diabetes mellitus with hyperglycemia: Secondary | ICD-10-CM | POA: Diagnosis not present

## 2013-10-04 DIAGNOSIS — F17200 Nicotine dependence, unspecified, uncomplicated: Secondary | ICD-10-CM | POA: Diagnosis not present

## 2013-10-04 DIAGNOSIS — E1129 Type 2 diabetes mellitus with other diabetic kidney complication: Secondary | ICD-10-CM | POA: Diagnosis not present

## 2013-10-04 DIAGNOSIS — J449 Chronic obstructive pulmonary disease, unspecified: Secondary | ICD-10-CM | POA: Diagnosis not present

## 2013-10-04 DIAGNOSIS — IMO0001 Reserved for inherently not codable concepts without codable children: Secondary | ICD-10-CM | POA: Diagnosis not present

## 2013-10-13 DIAGNOSIS — Z23 Encounter for immunization: Secondary | ICD-10-CM | POA: Diagnosis not present

## 2013-11-15 DIAGNOSIS — B351 Tinea unguium: Secondary | ICD-10-CM | POA: Diagnosis not present

## 2013-11-15 DIAGNOSIS — L84 Corns and callosities: Secondary | ICD-10-CM | POA: Diagnosis not present

## 2013-11-15 DIAGNOSIS — E1142 Type 2 diabetes mellitus with diabetic polyneuropathy: Secondary | ICD-10-CM | POA: Diagnosis not present

## 2013-11-24 DIAGNOSIS — J449 Chronic obstructive pulmonary disease, unspecified: Secondary | ICD-10-CM | POA: Diagnosis not present

## 2013-11-24 DIAGNOSIS — I10 Essential (primary) hypertension: Secondary | ICD-10-CM | POA: Diagnosis not present

## 2013-11-24 DIAGNOSIS — N183 Chronic kidney disease, stage 3 (moderate): Secondary | ICD-10-CM | POA: Diagnosis not present

## 2013-11-24 DIAGNOSIS — E1129 Type 2 diabetes mellitus with other diabetic kidney complication: Secondary | ICD-10-CM | POA: Diagnosis not present

## 2013-11-24 DIAGNOSIS — Z6833 Body mass index (BMI) 33.0-33.9, adult: Secondary | ICD-10-CM | POA: Diagnosis not present

## 2013-12-13 DIAGNOSIS — E119 Type 2 diabetes mellitus without complications: Secondary | ICD-10-CM | POA: Diagnosis not present

## 2013-12-19 ENCOUNTER — Encounter: Payer: Self-pay | Admitting: Cardiology

## 2014-01-03 DIAGNOSIS — Z6832 Body mass index (BMI) 32.0-32.9, adult: Secondary | ICD-10-CM | POA: Diagnosis not present

## 2014-01-03 DIAGNOSIS — E1129 Type 2 diabetes mellitus with other diabetic kidney complication: Secondary | ICD-10-CM | POA: Diagnosis not present

## 2014-01-24 DIAGNOSIS — L84 Corns and callosities: Secondary | ICD-10-CM | POA: Diagnosis not present

## 2014-01-24 DIAGNOSIS — B351 Tinea unguium: Secondary | ICD-10-CM | POA: Diagnosis not present

## 2014-01-24 DIAGNOSIS — E1142 Type 2 diabetes mellitus with diabetic polyneuropathy: Secondary | ICD-10-CM | POA: Diagnosis not present

## 2014-01-30 ENCOUNTER — Telehealth: Payer: Self-pay | Admitting: Cardiology

## 2014-01-30 ENCOUNTER — Ambulatory Visit: Payer: Medicare Other | Admitting: Cardiology

## 2014-01-31 ENCOUNTER — Other Ambulatory Visit: Payer: Self-pay | Admitting: *Deleted

## 2014-01-31 ENCOUNTER — Ambulatory Visit (INDEPENDENT_AMBULATORY_CARE_PROVIDER_SITE_OTHER): Payer: Medicare Other | Admitting: Cardiology

## 2014-01-31 ENCOUNTER — Encounter: Payer: Self-pay | Admitting: Cardiology

## 2014-01-31 VITALS — BP 132/64 | HR 66 | Ht 66.0 in | Wt 217.6 lb

## 2014-01-31 DIAGNOSIS — I2510 Atherosclerotic heart disease of native coronary artery without angina pectoris: Secondary | ICD-10-CM

## 2014-01-31 DIAGNOSIS — I2583 Coronary atherosclerosis due to lipid rich plaque: Principal | ICD-10-CM

## 2014-01-31 MED ORDER — METOPROLOL SUCCINATE ER 25 MG PO TB24: ORAL_TABLET | ORAL | Status: DC

## 2014-01-31 MED ORDER — IRBESARTAN-HYDROCHLOROTHIAZIDE 300-12.5 MG PO TABS: 1.0000 | ORAL_TABLET | Freq: Every day | ORAL | Status: DC

## 2014-01-31 MED ORDER — CLOPIDOGREL BISULFATE 75 MG PO TABS: 75.0000 mg | ORAL_TABLET | Freq: Every day | ORAL | Status: DC

## 2014-01-31 MED ORDER — AMLODIPINE BESYLATE 2.5 MG PO TABS: 2.5000 mg | ORAL_TABLET | Freq: Every day | ORAL | Status: DC

## 2014-01-31 NOTE — Patient Instructions (Signed)
Your physician recommends that you schedule a follow-up appointment in: one year with Dr. Hochrein  

## 2014-01-31 NOTE — Progress Notes (Signed)
HPI    The patient presents for followup of his known coronary disease.  Since I ast saw him he has had no new cardiovascular complaints. The patient denies any new symptoms such as chest discomfort, neck or arm discomfort. There has been no new shortness of breath, PND or orthopnea. There have been no reported palpitations, presyncope or syncope.  He doesn't exercise he still walks 100 yards to his mailbox most days. He unfortunately continues to smoke cigarettes.   No Known Allergies  Current Outpatient Prescriptions  Medication Sig Dispense Refill  . amLODipine (NORVASC) 2.5 MG tablet Take 1 tablet (2.5 mg total) by mouth daily. 30 tablet 11  . cholestyramine light (PREVALITE) 4 G packet Take 4 g by mouth daily.      . clopidogrel (PLAVIX) 75 MG tablet Take 75 mg by mouth daily.      . ferrous sulfate 325 (65 FE) MG EC tablet Take 325 mg by mouth 3 (three) times daily with meals.    . irbesartan-hydrochlorothiazide (AVALIDE) 300-12.5 MG per tablet Take 1 tablet by mouth daily.    Marland Kitchen LEVEMIR FLEXTOUCH 100 UNIT/ML Pen Inject 34 g into the skin daily.  5  . metoprolol succinate (TOPROL-XL) 25 MG 24 hr tablet TAKE 1 TABLET BY MOUTH TWICE DAILY 60 tablet 5  . omeprazole (PRILOSEC) 20 MG capsule Take 20 mg by mouth daily.      . saxagliptin HCl (ONGLYZA) 5 MG TABS tablet Take by mouth daily.    . simvastatin (ZOCOR) 40 MG tablet Take 40 mg by mouth at bedtime.      Marland Kitchen tiotropium (SPIRIVA) 18 MCG inhalation capsule Place 1 capsule (18 mcg total) into inhaler and inhale daily. 30 capsule 11   No current facility-administered medications for this visit.    Past Medical History  Diagnosis Date  . CAD (coronary artery disease)     (2005 LAD 30-40% stenosis  followed by 60-65% focal stenosis.  The second diagonal had 80%   stenosis.  The circumflex had 85-90% mid stenosis and was stented with a  Taxus stent.  The obtuse marginal had 20-25% stenosis and obtuse   marginal-2 had 40-50% stenosis.  The  right coronary artery had 40-50%  proximal stenosis and mid 75% stenosis), well-preserved ejection  fraction (50-55% )  . Obesity   . HTN (hypertension)   . Sleep apnea     using CPAP   . PUD (peptic ulcer disease)   . DM (diabetes mellitus)   . Prostate cancer 2001    Radiation   . Carotid stenosis   . Sleep apnea   . History of colonic polyps   . Sigmoid diverticulosis     Hx of  . Radiation proctitis   . Bronchitis     Hx of  . Myocardial infarction     Past Surgical History  Procedure Laterality Date  . Cholecystectomy    . Left arm repair  1994  . Hernia repair      right inguinal  . Cardiac catheterization  12/13/2003    ptca, stent x 2  . Colonoscopy  05/11/2000  . Colonoscopy w/ polypectomy      ROS:  As stated in the HPI and negative for all other systems.  PHYSICAL EXAM BP 132/64 mmHg  Pulse 66  Ht 5\' 6"  (1.676 m)  Wt 217 lb 9.6 oz (98.703 kg)  BMI 35.14 kg/m2 GENERAL:  Well appearing HEENT:  Pupils equal round and reactive, fundi not visualized,  oral mucosa unremarkable NECK:  No jugular venous distention, waveform within normal limits, carotid upstroke brisk and symmetric, no bruits, no thyromegaly LYMPHATICS:  No cervical, inguinal adenopathy LUNGS:  Clear to auscultation bilaterally BACK:  No CVA tenderness CHEST:  Unremarkable HEART:  PMI not displaced or sustained,S1 and S2 within normal limits, no S3, no S4, no clicks, no rubs, no murmurs ABD:  Flat, positive bowel sounds normal in frequency in pitch, no bruits, no rebound, no guarding, no midline pulsatile mass, no hepatomegaly, no splenomegaly, obese EXT:  2 plus pulses upper, diminished bilateral lower, no edema, no cyanosis no clubbing, left arm s/p trauma   EKG:  Sinus rhythm, right bundle branch block, and first degree AV block, premature atrial contraction, rate 66, no acute ST-T wave changes no change from previous 01/31/2014   A/P  CAD -  He has no new symptoms since his last stress test  in 2012. No further cardiovascular testing is suggested. I would like to participate more in lifestyle changes and risk reduction.  However, I doubt change anything significantly.   Of note he doesn't tolerate aspirin and will stay on Plavix.    TOBACCO ABUSE -  We again talked at length about stopping smoking and he doesn't think he can do this.    DYSLIPIDEMIA -  He's been sensitive to medications. He tolerates the current simvastatin. I will defer management to Dr.  Dagmar Hait  Carotid bruit - He had 60-79% right stenosis and 40-59% left stenosis in August 2015. He will follow-up with another carotid Doppler this August.  HYPERTENSION -  The blood pressure is at target. No change in medications is indicated. We will continue with therapeutic lifestyle changes (TLC).

## 2014-02-01 NOTE — Telephone Encounter (Signed)
clsoed encounter

## 2014-03-02 DIAGNOSIS — I10 Essential (primary) hypertension: Secondary | ICD-10-CM | POA: Diagnosis not present

## 2014-03-02 DIAGNOSIS — F17200 Nicotine dependence, unspecified, uncomplicated: Secondary | ICD-10-CM | POA: Diagnosis not present

## 2014-03-02 DIAGNOSIS — I2510 Atherosclerotic heart disease of native coronary artery without angina pectoris: Secondary | ICD-10-CM | POA: Diagnosis not present

## 2014-03-02 DIAGNOSIS — J449 Chronic obstructive pulmonary disease, unspecified: Secondary | ICD-10-CM | POA: Diagnosis not present

## 2014-03-02 DIAGNOSIS — E785 Hyperlipidemia, unspecified: Secondary | ICD-10-CM | POA: Diagnosis not present

## 2014-03-02 DIAGNOSIS — N183 Chronic kidney disease, stage 3 (moderate): Secondary | ICD-10-CM | POA: Diagnosis not present

## 2014-03-02 DIAGNOSIS — E1129 Type 2 diabetes mellitus with other diabetic kidney complication: Secondary | ICD-10-CM | POA: Diagnosis not present

## 2014-03-02 DIAGNOSIS — Z1389 Encounter for screening for other disorder: Secondary | ICD-10-CM | POA: Diagnosis not present

## 2014-03-02 DIAGNOSIS — Z6832 Body mass index (BMI) 32.0-32.9, adult: Secondary | ICD-10-CM | POA: Diagnosis not present

## 2014-03-20 DIAGNOSIS — E785 Hyperlipidemia, unspecified: Secondary | ICD-10-CM | POA: Diagnosis not present

## 2014-04-04 DIAGNOSIS — L84 Corns and callosities: Secondary | ICD-10-CM | POA: Diagnosis not present

## 2014-04-04 DIAGNOSIS — B351 Tinea unguium: Secondary | ICD-10-CM | POA: Diagnosis not present

## 2014-04-04 DIAGNOSIS — E1142 Type 2 diabetes mellitus with diabetic polyneuropathy: Secondary | ICD-10-CM | POA: Diagnosis not present

## 2014-04-17 DIAGNOSIS — N390 Urinary tract infection, site not specified: Secondary | ICD-10-CM | POA: Diagnosis not present

## 2014-04-17 DIAGNOSIS — R8290 Unspecified abnormal findings in urine: Secondary | ICD-10-CM | POA: Diagnosis not present

## 2014-06-13 DIAGNOSIS — L84 Corns and callosities: Secondary | ICD-10-CM | POA: Diagnosis not present

## 2014-06-13 DIAGNOSIS — E1142 Type 2 diabetes mellitus with diabetic polyneuropathy: Secondary | ICD-10-CM | POA: Diagnosis not present

## 2014-06-13 DIAGNOSIS — B351 Tinea unguium: Secondary | ICD-10-CM | POA: Diagnosis not present

## 2014-08-22 DIAGNOSIS — B351 Tinea unguium: Secondary | ICD-10-CM | POA: Diagnosis not present

## 2014-08-22 DIAGNOSIS — E1142 Type 2 diabetes mellitus with diabetic polyneuropathy: Secondary | ICD-10-CM | POA: Diagnosis not present

## 2014-08-22 DIAGNOSIS — L84 Corns and callosities: Secondary | ICD-10-CM | POA: Diagnosis not present

## 2014-09-04 DIAGNOSIS — F17200 Nicotine dependence, unspecified, uncomplicated: Secondary | ICD-10-CM | POA: Diagnosis not present

## 2014-09-04 DIAGNOSIS — E1165 Type 2 diabetes mellitus with hyperglycemia: Secondary | ICD-10-CM | POA: Diagnosis not present

## 2014-09-04 DIAGNOSIS — Z6831 Body mass index (BMI) 31.0-31.9, adult: Secondary | ICD-10-CM | POA: Diagnosis not present

## 2014-09-04 DIAGNOSIS — J449 Chronic obstructive pulmonary disease, unspecified: Secondary | ICD-10-CM | POA: Diagnosis not present

## 2014-09-04 DIAGNOSIS — Z23 Encounter for immunization: Secondary | ICD-10-CM | POA: Diagnosis not present

## 2014-09-04 DIAGNOSIS — G4733 Obstructive sleep apnea (adult) (pediatric): Secondary | ICD-10-CM | POA: Diagnosis not present

## 2014-09-04 DIAGNOSIS — N183 Chronic kidney disease, stage 3 (moderate): Secondary | ICD-10-CM | POA: Diagnosis not present

## 2014-09-04 DIAGNOSIS — E1122 Type 2 diabetes mellitus with diabetic chronic kidney disease: Secondary | ICD-10-CM | POA: Diagnosis not present

## 2014-09-04 DIAGNOSIS — I10 Essential (primary) hypertension: Secondary | ICD-10-CM | POA: Diagnosis not present

## 2014-09-04 DIAGNOSIS — I2510 Atherosclerotic heart disease of native coronary artery without angina pectoris: Secondary | ICD-10-CM | POA: Diagnosis not present

## 2014-09-04 DIAGNOSIS — R5383 Other fatigue: Secondary | ICD-10-CM | POA: Diagnosis not present

## 2014-09-04 DIAGNOSIS — E785 Hyperlipidemia, unspecified: Secondary | ICD-10-CM | POA: Diagnosis not present

## 2014-11-07 DIAGNOSIS — H5203 Hypermetropia, bilateral: Secondary | ICD-10-CM | POA: Diagnosis not present

## 2014-11-07 DIAGNOSIS — E119 Type 2 diabetes mellitus without complications: Secondary | ICD-10-CM | POA: Diagnosis not present

## 2014-11-07 DIAGNOSIS — H52223 Regular astigmatism, bilateral: Secondary | ICD-10-CM | POA: Diagnosis not present

## 2014-11-07 DIAGNOSIS — Z7984 Long term (current) use of oral hypoglycemic drugs: Secondary | ICD-10-CM | POA: Diagnosis not present

## 2014-11-07 DIAGNOSIS — H25813 Combined forms of age-related cataract, bilateral: Secondary | ICD-10-CM | POA: Diagnosis not present

## 2014-11-07 DIAGNOSIS — I10 Essential (primary) hypertension: Secondary | ICD-10-CM | POA: Diagnosis not present

## 2014-11-07 DIAGNOSIS — H35033 Hypertensive retinopathy, bilateral: Secondary | ICD-10-CM | POA: Diagnosis not present

## 2014-11-07 DIAGNOSIS — Z794 Long term (current) use of insulin: Secondary | ICD-10-CM | POA: Diagnosis not present

## 2014-11-09 DIAGNOSIS — B351 Tinea unguium: Secondary | ICD-10-CM | POA: Diagnosis not present

## 2014-11-09 DIAGNOSIS — E1142 Type 2 diabetes mellitus with diabetic polyneuropathy: Secondary | ICD-10-CM | POA: Diagnosis not present

## 2014-11-09 DIAGNOSIS — L84 Corns and callosities: Secondary | ICD-10-CM | POA: Diagnosis not present

## 2015-01-16 DIAGNOSIS — E1142 Type 2 diabetes mellitus with diabetic polyneuropathy: Secondary | ICD-10-CM | POA: Diagnosis not present

## 2015-01-16 DIAGNOSIS — L84 Corns and callosities: Secondary | ICD-10-CM | POA: Diagnosis not present

## 2015-01-16 DIAGNOSIS — B351 Tinea unguium: Secondary | ICD-10-CM | POA: Diagnosis not present

## 2015-02-05 ENCOUNTER — Ambulatory Visit: Payer: PRIVATE HEALTH INSURANCE | Admitting: Cardiology

## 2015-03-12 DIAGNOSIS — F17200 Nicotine dependence, unspecified, uncomplicated: Secondary | ICD-10-CM | POA: Diagnosis not present

## 2015-03-12 DIAGNOSIS — E784 Other hyperlipidemia: Secondary | ICD-10-CM | POA: Diagnosis not present

## 2015-03-12 DIAGNOSIS — Z6831 Body mass index (BMI) 31.0-31.9, adult: Secondary | ICD-10-CM | POA: Diagnosis not present

## 2015-03-12 DIAGNOSIS — G4733 Obstructive sleep apnea (adult) (pediatric): Secondary | ICD-10-CM | POA: Diagnosis not present

## 2015-03-12 DIAGNOSIS — J449 Chronic obstructive pulmonary disease, unspecified: Secondary | ICD-10-CM | POA: Diagnosis not present

## 2015-03-12 DIAGNOSIS — E1122 Type 2 diabetes mellitus with diabetic chronic kidney disease: Secondary | ICD-10-CM | POA: Diagnosis not present

## 2015-03-12 DIAGNOSIS — Z1389 Encounter for screening for other disorder: Secondary | ICD-10-CM | POA: Diagnosis not present

## 2015-03-12 DIAGNOSIS — I2510 Atherosclerotic heart disease of native coronary artery without angina pectoris: Secondary | ICD-10-CM | POA: Diagnosis not present

## 2015-03-12 DIAGNOSIS — I10 Essential (primary) hypertension: Secondary | ICD-10-CM | POA: Diagnosis not present

## 2015-03-12 DIAGNOSIS — N183 Chronic kidney disease, stage 3 (moderate): Secondary | ICD-10-CM | POA: Diagnosis not present

## 2015-03-27 DIAGNOSIS — L84 Corns and callosities: Secondary | ICD-10-CM | POA: Diagnosis not present

## 2015-03-27 DIAGNOSIS — E1142 Type 2 diabetes mellitus with diabetic polyneuropathy: Secondary | ICD-10-CM | POA: Diagnosis not present

## 2015-03-27 DIAGNOSIS — B351 Tinea unguium: Secondary | ICD-10-CM | POA: Diagnosis not present

## 2015-06-05 DIAGNOSIS — E1142 Type 2 diabetes mellitus with diabetic polyneuropathy: Secondary | ICD-10-CM | POA: Diagnosis not present

## 2015-06-05 DIAGNOSIS — L84 Corns and callosities: Secondary | ICD-10-CM | POA: Diagnosis not present

## 2015-06-05 DIAGNOSIS — B351 Tinea unguium: Secondary | ICD-10-CM | POA: Diagnosis not present

## 2015-09-11 DIAGNOSIS — E1165 Type 2 diabetes mellitus with hyperglycemia: Secondary | ICD-10-CM | POA: Diagnosis not present

## 2015-09-11 DIAGNOSIS — Z6831 Body mass index (BMI) 31.0-31.9, adult: Secondary | ICD-10-CM | POA: Diagnosis not present

## 2015-09-11 DIAGNOSIS — N183 Chronic kidney disease, stage 3 (moderate): Secondary | ICD-10-CM | POA: Diagnosis not present

## 2015-09-11 DIAGNOSIS — G4733 Obstructive sleep apnea (adult) (pediatric): Secondary | ICD-10-CM | POA: Diagnosis not present

## 2015-09-11 DIAGNOSIS — J302 Other seasonal allergic rhinitis: Secondary | ICD-10-CM | POA: Diagnosis not present

## 2015-09-11 DIAGNOSIS — E1122 Type 2 diabetes mellitus with diabetic chronic kidney disease: Secondary | ICD-10-CM | POA: Diagnosis not present

## 2015-09-11 DIAGNOSIS — I10 Essential (primary) hypertension: Secondary | ICD-10-CM | POA: Diagnosis not present

## 2015-09-11 DIAGNOSIS — Z23 Encounter for immunization: Secondary | ICD-10-CM | POA: Diagnosis not present

## 2015-09-11 DIAGNOSIS — J449 Chronic obstructive pulmonary disease, unspecified: Secondary | ICD-10-CM | POA: Diagnosis not present

## 2015-09-11 DIAGNOSIS — F17200 Nicotine dependence, unspecified, uncomplicated: Secondary | ICD-10-CM | POA: Diagnosis not present

## 2015-09-11 DIAGNOSIS — I2510 Atherosclerotic heart disease of native coronary artery without angina pectoris: Secondary | ICD-10-CM | POA: Diagnosis not present

## 2015-09-13 DIAGNOSIS — L84 Corns and callosities: Secondary | ICD-10-CM | POA: Diagnosis not present

## 2015-09-13 DIAGNOSIS — B351 Tinea unguium: Secondary | ICD-10-CM | POA: Diagnosis not present

## 2015-09-13 DIAGNOSIS — E1142 Type 2 diabetes mellitus with diabetic polyneuropathy: Secondary | ICD-10-CM | POA: Diagnosis not present

## 2015-10-16 DIAGNOSIS — Z85828 Personal history of other malignant neoplasm of skin: Secondary | ICD-10-CM | POA: Diagnosis not present

## 2015-10-16 DIAGNOSIS — L570 Actinic keratosis: Secondary | ICD-10-CM | POA: Diagnosis not present

## 2015-10-16 DIAGNOSIS — C44222 Squamous cell carcinoma of skin of right ear and external auricular canal: Secondary | ICD-10-CM | POA: Diagnosis not present

## 2015-11-23 DIAGNOSIS — L578 Other skin changes due to chronic exposure to nonionizing radiation: Secondary | ICD-10-CM | POA: Diagnosis not present

## 2015-11-23 DIAGNOSIS — Z85828 Personal history of other malignant neoplasm of skin: Secondary | ICD-10-CM | POA: Diagnosis not present

## 2015-11-23 DIAGNOSIS — L570 Actinic keratosis: Secondary | ICD-10-CM | POA: Diagnosis not present

## 2015-11-23 DIAGNOSIS — L821 Other seborrheic keratosis: Secondary | ICD-10-CM | POA: Diagnosis not present

## 2015-11-27 DIAGNOSIS — B351 Tinea unguium: Secondary | ICD-10-CM | POA: Diagnosis not present

## 2015-11-27 DIAGNOSIS — E1142 Type 2 diabetes mellitus with diabetic polyneuropathy: Secondary | ICD-10-CM | POA: Diagnosis not present

## 2015-11-27 DIAGNOSIS — L84 Corns and callosities: Secondary | ICD-10-CM | POA: Diagnosis not present

## 2016-02-05 DIAGNOSIS — L84 Corns and callosities: Secondary | ICD-10-CM | POA: Diagnosis not present

## 2016-02-05 DIAGNOSIS — B351 Tinea unguium: Secondary | ICD-10-CM | POA: Diagnosis not present

## 2016-02-05 DIAGNOSIS — E1142 Type 2 diabetes mellitus with diabetic polyneuropathy: Secondary | ICD-10-CM | POA: Diagnosis not present

## 2016-03-07 ENCOUNTER — Encounter: Payer: Self-pay | Admitting: Cardiology

## 2016-03-07 DIAGNOSIS — F17200 Nicotine dependence, unspecified, uncomplicated: Secondary | ICD-10-CM | POA: Diagnosis not present

## 2016-03-07 DIAGNOSIS — I2510 Atherosclerotic heart disease of native coronary artery without angina pectoris: Secondary | ICD-10-CM | POA: Diagnosis not present

## 2016-03-07 DIAGNOSIS — J449 Chronic obstructive pulmonary disease, unspecified: Secondary | ICD-10-CM | POA: Diagnosis not present

## 2016-03-07 DIAGNOSIS — E669 Obesity, unspecified: Secondary | ICD-10-CM | POA: Diagnosis not present

## 2016-03-07 DIAGNOSIS — N183 Chronic kidney disease, stage 3 (moderate): Secondary | ICD-10-CM | POA: Diagnosis not present

## 2016-03-07 DIAGNOSIS — L309 Dermatitis, unspecified: Secondary | ICD-10-CM | POA: Diagnosis not present

## 2016-03-07 DIAGNOSIS — Z6832 Body mass index (BMI) 32.0-32.9, adult: Secondary | ICD-10-CM | POA: Diagnosis not present

## 2016-03-07 DIAGNOSIS — Z1389 Encounter for screening for other disorder: Secondary | ICD-10-CM | POA: Diagnosis not present

## 2016-03-07 DIAGNOSIS — G4733 Obstructive sleep apnea (adult) (pediatric): Secondary | ICD-10-CM | POA: Diagnosis not present

## 2016-03-07 DIAGNOSIS — E784 Other hyperlipidemia: Secondary | ICD-10-CM | POA: Diagnosis not present

## 2016-03-07 DIAGNOSIS — I10 Essential (primary) hypertension: Secondary | ICD-10-CM | POA: Diagnosis not present

## 2016-03-07 DIAGNOSIS — E1122 Type 2 diabetes mellitus with diabetic chronic kidney disease: Secondary | ICD-10-CM | POA: Diagnosis not present

## 2016-04-15 DIAGNOSIS — L84 Corns and callosities: Secondary | ICD-10-CM | POA: Diagnosis not present

## 2016-04-15 DIAGNOSIS — B351 Tinea unguium: Secondary | ICD-10-CM | POA: Diagnosis not present

## 2016-04-15 DIAGNOSIS — E1142 Type 2 diabetes mellitus with diabetic polyneuropathy: Secondary | ICD-10-CM | POA: Diagnosis not present

## 2016-04-15 DIAGNOSIS — M79676 Pain in unspecified toe(s): Secondary | ICD-10-CM | POA: Diagnosis not present

## 2016-05-23 DIAGNOSIS — H5203 Hypermetropia, bilateral: Secondary | ICD-10-CM | POA: Diagnosis not present

## 2016-05-23 DIAGNOSIS — H25813 Combined forms of age-related cataract, bilateral: Secondary | ICD-10-CM | POA: Diagnosis not present

## 2016-05-23 DIAGNOSIS — H524 Presbyopia: Secondary | ICD-10-CM | POA: Diagnosis not present

## 2016-05-23 DIAGNOSIS — H52223 Regular astigmatism, bilateral: Secondary | ICD-10-CM | POA: Diagnosis not present

## 2016-05-23 DIAGNOSIS — H43399 Other vitreous opacities, unspecified eye: Secondary | ICD-10-CM | POA: Diagnosis not present

## 2016-05-23 DIAGNOSIS — E109 Type 1 diabetes mellitus without complications: Secondary | ICD-10-CM | POA: Diagnosis not present

## 2016-05-23 DIAGNOSIS — H35039 Hypertensive retinopathy, unspecified eye: Secondary | ICD-10-CM | POA: Diagnosis not present

## 2016-05-23 DIAGNOSIS — I10 Essential (primary) hypertension: Secondary | ICD-10-CM | POA: Diagnosis not present

## 2016-05-26 DIAGNOSIS — F17200 Nicotine dependence, unspecified, uncomplicated: Secondary | ICD-10-CM | POA: Diagnosis not present

## 2016-05-26 DIAGNOSIS — I10 Essential (primary) hypertension: Secondary | ICD-10-CM | POA: Diagnosis not present

## 2016-05-26 DIAGNOSIS — Z6832 Body mass index (BMI) 32.0-32.9, adult: Secondary | ICD-10-CM | POA: Diagnosis not present

## 2016-05-26 DIAGNOSIS — E1122 Type 2 diabetes mellitus with diabetic chronic kidney disease: Secondary | ICD-10-CM | POA: Diagnosis not present

## 2016-05-26 DIAGNOSIS — N183 Chronic kidney disease, stage 3 (moderate): Secondary | ICD-10-CM | POA: Diagnosis not present

## 2016-06-24 DIAGNOSIS — B351 Tinea unguium: Secondary | ICD-10-CM | POA: Diagnosis not present

## 2016-06-24 DIAGNOSIS — E1142 Type 2 diabetes mellitus with diabetic polyneuropathy: Secondary | ICD-10-CM | POA: Diagnosis not present

## 2016-06-24 DIAGNOSIS — L84 Corns and callosities: Secondary | ICD-10-CM | POA: Diagnosis not present

## 2016-06-24 DIAGNOSIS — M79676 Pain in unspecified toe(s): Secondary | ICD-10-CM | POA: Diagnosis not present

## 2016-08-12 DIAGNOSIS — Z7189 Other specified counseling: Secondary | ICD-10-CM | POA: Diagnosis not present

## 2016-08-12 DIAGNOSIS — E784 Other hyperlipidemia: Secondary | ICD-10-CM | POA: Diagnosis not present

## 2016-08-12 DIAGNOSIS — F17200 Nicotine dependence, unspecified, uncomplicated: Secondary | ICD-10-CM | POA: Diagnosis not present

## 2016-08-12 DIAGNOSIS — J449 Chronic obstructive pulmonary disease, unspecified: Secondary | ICD-10-CM | POA: Diagnosis not present

## 2016-08-12 DIAGNOSIS — Z6832 Body mass index (BMI) 32.0-32.9, adult: Secondary | ICD-10-CM | POA: Diagnosis not present

## 2016-08-12 DIAGNOSIS — I10 Essential (primary) hypertension: Secondary | ICD-10-CM | POA: Diagnosis not present

## 2016-08-12 DIAGNOSIS — R51 Headache: Secondary | ICD-10-CM | POA: Diagnosis not present

## 2016-08-12 DIAGNOSIS — I6523 Occlusion and stenosis of bilateral carotid arteries: Secondary | ICD-10-CM | POA: Diagnosis not present

## 2016-08-13 ENCOUNTER — Ambulatory Visit (HOSPITAL_COMMUNITY)
Admission: RE | Admit: 2016-08-13 | Discharge: 2016-08-13 | Disposition: A | Discharge status: Home / Self Care | Payer: Medicare Other | Source: Ambulatory Visit | Attending: Vascular Surgery | Admitting: Vascular Surgery

## 2016-08-13 ENCOUNTER — Other Ambulatory Visit (HOSPITAL_COMMUNITY): Payer: Self-pay | Admitting: Internal Medicine

## 2016-08-13 DIAGNOSIS — I6523 Occlusion and stenosis of bilateral carotid arteries: Secondary | ICD-10-CM | POA: Diagnosis not present

## 2016-09-02 DIAGNOSIS — M79676 Pain in unspecified toe(s): Secondary | ICD-10-CM | POA: Diagnosis not present

## 2016-09-02 DIAGNOSIS — L84 Corns and callosities: Secondary | ICD-10-CM | POA: Diagnosis not present

## 2016-09-02 DIAGNOSIS — B351 Tinea unguium: Secondary | ICD-10-CM | POA: Diagnosis not present

## 2016-09-02 DIAGNOSIS — E1142 Type 2 diabetes mellitus with diabetic polyneuropathy: Secondary | ICD-10-CM | POA: Diagnosis not present

## 2016-09-15 DIAGNOSIS — N183 Chronic kidney disease, stage 3 (moderate): Secondary | ICD-10-CM | POA: Diagnosis not present

## 2016-09-15 DIAGNOSIS — J449 Chronic obstructive pulmonary disease, unspecified: Secondary | ICD-10-CM | POA: Diagnosis not present

## 2016-09-15 DIAGNOSIS — I10 Essential (primary) hypertension: Secondary | ICD-10-CM | POA: Diagnosis not present

## 2016-09-15 DIAGNOSIS — R51 Headache: Secondary | ICD-10-CM | POA: Diagnosis not present

## 2016-09-15 DIAGNOSIS — E1122 Type 2 diabetes mellitus with diabetic chronic kidney disease: Secondary | ICD-10-CM | POA: Diagnosis not present

## 2016-09-15 DIAGNOSIS — I6523 Occlusion and stenosis of bilateral carotid arteries: Secondary | ICD-10-CM | POA: Diagnosis not present

## 2016-09-15 DIAGNOSIS — Z6832 Body mass index (BMI) 32.0-32.9, adult: Secondary | ICD-10-CM | POA: Diagnosis not present

## 2016-09-15 DIAGNOSIS — F17200 Nicotine dependence, unspecified, uncomplicated: Secondary | ICD-10-CM | POA: Diagnosis not present

## 2016-10-14 DIAGNOSIS — Z23 Encounter for immunization: Secondary | ICD-10-CM | POA: Diagnosis not present

## 2016-11-11 DIAGNOSIS — B351 Tinea unguium: Secondary | ICD-10-CM | POA: Diagnosis not present

## 2016-11-11 DIAGNOSIS — M79676 Pain in unspecified toe(s): Secondary | ICD-10-CM | POA: Diagnosis not present

## 2016-11-11 DIAGNOSIS — E1142 Type 2 diabetes mellitus with diabetic polyneuropathy: Secondary | ICD-10-CM | POA: Diagnosis not present

## 2016-11-11 DIAGNOSIS — L84 Corns and callosities: Secondary | ICD-10-CM | POA: Diagnosis not present

## 2017-01-20 DIAGNOSIS — E1142 Type 2 diabetes mellitus with diabetic polyneuropathy: Secondary | ICD-10-CM | POA: Diagnosis not present

## 2017-01-20 DIAGNOSIS — M79676 Pain in unspecified toe(s): Secondary | ICD-10-CM | POA: Diagnosis not present

## 2017-01-20 DIAGNOSIS — L84 Corns and callosities: Secondary | ICD-10-CM | POA: Diagnosis not present

## 2017-01-20 DIAGNOSIS — B351 Tinea unguium: Secondary | ICD-10-CM | POA: Diagnosis not present

## 2017-01-30 DIAGNOSIS — I6523 Occlusion and stenosis of bilateral carotid arteries: Secondary | ICD-10-CM | POA: Diagnosis not present

## 2017-01-30 DIAGNOSIS — Z1389 Encounter for screening for other disorder: Secondary | ICD-10-CM | POA: Diagnosis not present

## 2017-01-30 DIAGNOSIS — R82998 Other abnormal findings in urine: Secondary | ICD-10-CM | POA: Diagnosis not present

## 2017-01-30 DIAGNOSIS — Z6832 Body mass index (BMI) 32.0-32.9, adult: Secondary | ICD-10-CM | POA: Diagnosis not present

## 2017-01-30 DIAGNOSIS — N183 Chronic kidney disease, stage 3 (moderate): Secondary | ICD-10-CM | POA: Diagnosis not present

## 2017-01-30 DIAGNOSIS — G4733 Obstructive sleep apnea (adult) (pediatric): Secondary | ICD-10-CM | POA: Diagnosis not present

## 2017-01-30 DIAGNOSIS — F17200 Nicotine dependence, unspecified, uncomplicated: Secondary | ICD-10-CM | POA: Diagnosis not present

## 2017-01-30 DIAGNOSIS — R197 Diarrhea, unspecified: Secondary | ICD-10-CM | POA: Diagnosis not present

## 2017-01-30 DIAGNOSIS — I10 Essential (primary) hypertension: Secondary | ICD-10-CM | POA: Diagnosis not present

## 2017-01-30 DIAGNOSIS — E668 Other obesity: Secondary | ICD-10-CM | POA: Diagnosis not present

## 2017-01-30 DIAGNOSIS — E1151 Type 2 diabetes mellitus with diabetic peripheral angiopathy without gangrene: Secondary | ICD-10-CM | POA: Diagnosis not present

## 2017-03-31 DIAGNOSIS — E1151 Type 2 diabetes mellitus with diabetic peripheral angiopathy without gangrene: Secondary | ICD-10-CM | POA: Diagnosis not present

## 2017-03-31 DIAGNOSIS — M79676 Pain in unspecified toe(s): Secondary | ICD-10-CM | POA: Diagnosis not present

## 2017-03-31 DIAGNOSIS — L84 Corns and callosities: Secondary | ICD-10-CM | POA: Diagnosis not present

## 2017-03-31 DIAGNOSIS — B351 Tinea unguium: Secondary | ICD-10-CM | POA: Diagnosis not present

## 2017-05-12 DIAGNOSIS — E1151 Type 2 diabetes mellitus with diabetic peripheral angiopathy without gangrene: Secondary | ICD-10-CM | POA: Diagnosis not present

## 2017-05-12 DIAGNOSIS — R51 Headache: Secondary | ICD-10-CM | POA: Diagnosis not present

## 2017-05-12 DIAGNOSIS — Z6831 Body mass index (BMI) 31.0-31.9, adult: Secondary | ICD-10-CM | POA: Diagnosis not present

## 2017-05-12 DIAGNOSIS — D509 Iron deficiency anemia, unspecified: Secondary | ICD-10-CM | POA: Diagnosis not present

## 2017-05-12 DIAGNOSIS — J449 Chronic obstructive pulmonary disease, unspecified: Secondary | ICD-10-CM | POA: Diagnosis not present

## 2017-05-12 DIAGNOSIS — I10 Essential (primary) hypertension: Secondary | ICD-10-CM | POA: Diagnosis not present

## 2017-05-20 DIAGNOSIS — L820 Inflamed seborrheic keratosis: Secondary | ICD-10-CM | POA: Diagnosis not present

## 2017-05-20 DIAGNOSIS — C44229 Squamous cell carcinoma of skin of left ear and external auricular canal: Secondary | ICD-10-CM | POA: Diagnosis not present

## 2017-05-20 DIAGNOSIS — D225 Melanocytic nevi of trunk: Secondary | ICD-10-CM | POA: Diagnosis not present

## 2017-05-20 DIAGNOSIS — L570 Actinic keratosis: Secondary | ICD-10-CM | POA: Diagnosis not present

## 2017-05-20 DIAGNOSIS — C4442 Squamous cell carcinoma of skin of scalp and neck: Secondary | ICD-10-CM | POA: Diagnosis not present

## 2017-05-20 DIAGNOSIS — X32XXXA Exposure to sunlight, initial encounter: Secondary | ICD-10-CM | POA: Diagnosis not present

## 2017-06-08 DIAGNOSIS — H5203 Hypermetropia, bilateral: Secondary | ICD-10-CM | POA: Diagnosis not present

## 2017-06-08 DIAGNOSIS — E109 Type 1 diabetes mellitus without complications: Secondary | ICD-10-CM | POA: Diagnosis not present

## 2017-06-08 DIAGNOSIS — H43399 Other vitreous opacities, unspecified eye: Secondary | ICD-10-CM | POA: Diagnosis not present

## 2017-06-08 DIAGNOSIS — I10 Essential (primary) hypertension: Secondary | ICD-10-CM | POA: Diagnosis not present

## 2017-06-08 DIAGNOSIS — H25813 Combined forms of age-related cataract, bilateral: Secondary | ICD-10-CM | POA: Diagnosis not present

## 2017-06-08 DIAGNOSIS — H52223 Regular astigmatism, bilateral: Secondary | ICD-10-CM | POA: Diagnosis not present

## 2017-06-08 DIAGNOSIS — H35039 Hypertensive retinopathy, unspecified eye: Secondary | ICD-10-CM | POA: Diagnosis not present

## 2017-06-08 DIAGNOSIS — H524 Presbyopia: Secondary | ICD-10-CM | POA: Diagnosis not present

## 2017-06-09 DIAGNOSIS — M79676 Pain in unspecified toe(s): Secondary | ICD-10-CM | POA: Diagnosis not present

## 2017-06-09 DIAGNOSIS — L84 Corns and callosities: Secondary | ICD-10-CM | POA: Diagnosis not present

## 2017-06-09 DIAGNOSIS — E1142 Type 2 diabetes mellitus with diabetic polyneuropathy: Secondary | ICD-10-CM | POA: Diagnosis not present

## 2017-06-09 DIAGNOSIS — B351 Tinea unguium: Secondary | ICD-10-CM | POA: Diagnosis not present

## 2017-06-18 DIAGNOSIS — N183 Chronic kidney disease, stage 3 (moderate): Secondary | ICD-10-CM | POA: Diagnosis not present

## 2017-06-18 DIAGNOSIS — C4490 Unspecified malignant neoplasm of skin, unspecified: Secondary | ICD-10-CM | POA: Diagnosis not present

## 2017-06-18 DIAGNOSIS — I10 Essential (primary) hypertension: Secondary | ICD-10-CM | POA: Diagnosis not present

## 2017-06-18 DIAGNOSIS — Z6831 Body mass index (BMI) 31.0-31.9, adult: Secondary | ICD-10-CM | POA: Diagnosis not present

## 2017-06-18 DIAGNOSIS — F17200 Nicotine dependence, unspecified, uncomplicated: Secondary | ICD-10-CM | POA: Diagnosis not present

## 2017-06-18 DIAGNOSIS — I6523 Occlusion and stenosis of bilateral carotid arteries: Secondary | ICD-10-CM | POA: Diagnosis not present

## 2017-06-18 DIAGNOSIS — R51 Headache: Secondary | ICD-10-CM | POA: Diagnosis not present

## 2017-06-18 DIAGNOSIS — E668 Other obesity: Secondary | ICD-10-CM | POA: Diagnosis not present

## 2017-06-18 DIAGNOSIS — I2510 Atherosclerotic heart disease of native coronary artery without angina pectoris: Secondary | ICD-10-CM | POA: Diagnosis not present

## 2017-06-18 DIAGNOSIS — E1151 Type 2 diabetes mellitus with diabetic peripheral angiopathy without gangrene: Secondary | ICD-10-CM | POA: Diagnosis not present

## 2017-07-01 DIAGNOSIS — C44229 Squamous cell carcinoma of skin of left ear and external auricular canal: Secondary | ICD-10-CM | POA: Diagnosis not present

## 2017-07-08 DIAGNOSIS — L570 Actinic keratosis: Secondary | ICD-10-CM | POA: Diagnosis not present

## 2017-07-08 DIAGNOSIS — X32XXXD Exposure to sunlight, subsequent encounter: Secondary | ICD-10-CM | POA: Diagnosis not present

## 2017-07-08 DIAGNOSIS — Z08 Encounter for follow-up examination after completed treatment for malignant neoplasm: Secondary | ICD-10-CM | POA: Diagnosis not present

## 2017-07-08 DIAGNOSIS — Z85828 Personal history of other malignant neoplasm of skin: Secondary | ICD-10-CM | POA: Diagnosis not present

## 2017-07-14 DIAGNOSIS — I2510 Atherosclerotic heart disease of native coronary artery without angina pectoris: Secondary | ICD-10-CM | POA: Diagnosis not present

## 2017-07-14 DIAGNOSIS — Z6831 Body mass index (BMI) 31.0-31.9, adult: Secondary | ICD-10-CM | POA: Diagnosis not present

## 2017-07-14 DIAGNOSIS — I10 Essential (primary) hypertension: Secondary | ICD-10-CM | POA: Diagnosis not present

## 2017-07-14 DIAGNOSIS — E1122 Type 2 diabetes mellitus with diabetic chronic kidney disease: Secondary | ICD-10-CM | POA: Diagnosis not present

## 2017-07-14 DIAGNOSIS — I6523 Occlusion and stenosis of bilateral carotid arteries: Secondary | ICD-10-CM | POA: Diagnosis not present

## 2017-07-14 DIAGNOSIS — R51 Headache: Secondary | ICD-10-CM | POA: Diagnosis not present

## 2017-07-14 DIAGNOSIS — N183 Chronic kidney disease, stage 3 (moderate): Secondary | ICD-10-CM | POA: Diagnosis not present

## 2017-07-16 ENCOUNTER — Other Ambulatory Visit: Payer: Self-pay | Admitting: Internal Medicine

## 2017-07-16 DIAGNOSIS — M542 Cervicalgia: Secondary | ICD-10-CM

## 2017-07-19 ENCOUNTER — Ambulatory Visit
Admission: RE | Admit: 2017-07-19 | Discharge: 2017-07-19 | Disposition: A | Discharge status: Home / Self Care | Payer: Medicare Other | Source: Ambulatory Visit | Attending: Internal Medicine | Admitting: Internal Medicine

## 2017-07-19 DIAGNOSIS — M4802 Spinal stenosis, cervical region: Secondary | ICD-10-CM | POA: Diagnosis not present

## 2017-07-19 DIAGNOSIS — M542 Cervicalgia: Secondary | ICD-10-CM

## 2017-08-18 DIAGNOSIS — E1142 Type 2 diabetes mellitus with diabetic polyneuropathy: Secondary | ICD-10-CM | POA: Diagnosis not present

## 2017-08-18 DIAGNOSIS — L84 Corns and callosities: Secondary | ICD-10-CM | POA: Diagnosis not present

## 2017-08-18 DIAGNOSIS — M79676 Pain in unspecified toe(s): Secondary | ICD-10-CM | POA: Diagnosis not present

## 2017-08-18 DIAGNOSIS — B351 Tinea unguium: Secondary | ICD-10-CM | POA: Diagnosis not present

## 2017-10-13 DIAGNOSIS — Z23 Encounter for immunization: Secondary | ICD-10-CM | POA: Diagnosis not present

## 2017-10-27 DIAGNOSIS — E1142 Type 2 diabetes mellitus with diabetic polyneuropathy: Secondary | ICD-10-CM | POA: Diagnosis not present

## 2017-10-27 DIAGNOSIS — L84 Corns and callosities: Secondary | ICD-10-CM | POA: Diagnosis not present

## 2017-10-27 DIAGNOSIS — B351 Tinea unguium: Secondary | ICD-10-CM | POA: Diagnosis not present

## 2017-10-27 DIAGNOSIS — M79676 Pain in unspecified toe(s): Secondary | ICD-10-CM | POA: Diagnosis not present

## 2017-11-04 DIAGNOSIS — I6523 Occlusion and stenosis of bilateral carotid arteries: Secondary | ICD-10-CM | POA: Diagnosis not present

## 2017-11-04 DIAGNOSIS — J449 Chronic obstructive pulmonary disease, unspecified: Secondary | ICD-10-CM | POA: Diagnosis not present

## 2017-11-04 DIAGNOSIS — E1151 Type 2 diabetes mellitus with diabetic peripheral angiopathy without gangrene: Secondary | ICD-10-CM | POA: Diagnosis not present

## 2017-11-04 DIAGNOSIS — N183 Chronic kidney disease, stage 3 (moderate): Secondary | ICD-10-CM | POA: Diagnosis not present

## 2017-11-04 DIAGNOSIS — E7849 Other hyperlipidemia: Secondary | ICD-10-CM | POA: Diagnosis not present

## 2017-11-04 DIAGNOSIS — F17200 Nicotine dependence, unspecified, uncomplicated: Secondary | ICD-10-CM | POA: Diagnosis not present

## 2017-11-04 DIAGNOSIS — Z9181 History of falling: Secondary | ICD-10-CM | POA: Diagnosis not present

## 2017-11-04 DIAGNOSIS — R82998 Other abnormal findings in urine: Secondary | ICD-10-CM | POA: Diagnosis not present

## 2017-11-04 DIAGNOSIS — M79606 Pain in leg, unspecified: Secondary | ICD-10-CM | POA: Diagnosis not present

## 2017-11-04 DIAGNOSIS — Z1389 Encounter for screening for other disorder: Secondary | ICD-10-CM | POA: Diagnosis not present

## 2017-11-04 DIAGNOSIS — I10 Essential (primary) hypertension: Secondary | ICD-10-CM | POA: Diagnosis not present

## 2017-11-04 DIAGNOSIS — Z6831 Body mass index (BMI) 31.0-31.9, adult: Secondary | ICD-10-CM | POA: Diagnosis not present

## 2017-11-04 DIAGNOSIS — E668 Other obesity: Secondary | ICD-10-CM | POA: Diagnosis not present

## 2017-11-11 ENCOUNTER — Ambulatory Visit (HOSPITAL_COMMUNITY)
Admission: RE | Admit: 2017-11-11 | Discharge: 2017-11-11 | Disposition: A | Discharge status: Home / Self Care | Payer: Medicare Other | Source: Ambulatory Visit | Attending: Vascular Surgery | Admitting: Vascular Surgery

## 2017-11-11 ENCOUNTER — Other Ambulatory Visit (HOSPITAL_COMMUNITY): Payer: Self-pay | Admitting: Internal Medicine

## 2017-11-11 DIAGNOSIS — M79605 Pain in left leg: Principal | ICD-10-CM

## 2017-11-11 DIAGNOSIS — M79604 Pain in right leg: Secondary | ICD-10-CM | POA: Diagnosis not present

## 2017-12-01 ENCOUNTER — Emergency Department (HOSPITAL_COMMUNITY): Payer: Medicare Other

## 2017-12-01 ENCOUNTER — Other Ambulatory Visit: Payer: Self-pay

## 2017-12-01 ENCOUNTER — Emergency Department (HOSPITAL_COMMUNITY)
Admission: EM | Admit: 2017-12-01 | Discharge: 2017-12-01 | Disposition: A | Discharge status: Home / Self Care | Payer: Medicare Other | Attending: Emergency Medicine | Admitting: Emergency Medicine

## 2017-12-01 ENCOUNTER — Encounter (HOSPITAL_COMMUNITY): Payer: Self-pay | Admitting: Emergency Medicine

## 2017-12-01 DIAGNOSIS — F17210 Nicotine dependence, cigarettes, uncomplicated: Secondary | ICD-10-CM | POA: Insufficient documentation

## 2017-12-01 DIAGNOSIS — R1032 Left lower quadrant pain: Secondary | ICD-10-CM | POA: Diagnosis not present

## 2017-12-01 DIAGNOSIS — N23 Unspecified renal colic: Secondary | ICD-10-CM

## 2017-12-01 DIAGNOSIS — I10 Essential (primary) hypertension: Secondary | ICD-10-CM | POA: Insufficient documentation

## 2017-12-01 DIAGNOSIS — Z79899 Other long term (current) drug therapy: Secondary | ICD-10-CM | POA: Diagnosis not present

## 2017-12-01 DIAGNOSIS — K4090 Unilateral inguinal hernia, without obstruction or gangrene, not specified as recurrent: Secondary | ICD-10-CM | POA: Diagnosis not present

## 2017-12-01 DIAGNOSIS — E1165 Type 2 diabetes mellitus with hyperglycemia: Secondary | ICD-10-CM | POA: Insufficient documentation

## 2017-12-01 DIAGNOSIS — K5730 Diverticulosis of large intestine without perforation or abscess without bleeding: Secondary | ICD-10-CM | POA: Diagnosis not present

## 2017-12-01 DIAGNOSIS — N1339 Other hydronephrosis: Secondary | ICD-10-CM | POA: Diagnosis not present

## 2017-12-01 DIAGNOSIS — R001 Bradycardia, unspecified: Secondary | ICD-10-CM | POA: Diagnosis not present

## 2017-12-01 LAB — CBC WITH DIFFERENTIAL/PLATELET
ABS IMMATURE GRANULOCYTES: 0.05 10*3/uL (ref 0.00–0.07)
Basophils Absolute: 0.1 10*3/uL (ref 0.0–0.1)
Basophils Relative: 1 %
EOS PCT: 0 %
Eosinophils Absolute: 0 10*3/uL (ref 0.0–0.5)
HCT: 41 % (ref 39.0–52.0)
Hemoglobin: 13.4 g/dL (ref 13.0–17.0)
Immature Granulocytes: 1 %
Lymphocytes Relative: 18 %
Lymphs Abs: 1.6 10*3/uL (ref 0.7–4.0)
MCH: 30.5 pg (ref 26.0–34.0)
MCHC: 32.7 g/dL (ref 30.0–36.0)
MCV: 93.4 fL (ref 80.0–100.0)
MONO ABS: 0.4 10*3/uL (ref 0.1–1.0)
MONOS PCT: 5 %
Neutro Abs: 6.8 10*3/uL (ref 1.7–7.7)
Neutrophils Relative %: 75 %
Platelets: 143 10*3/uL — ABNORMAL LOW (ref 150–400)
RBC: 4.39 MIL/uL (ref 4.22–5.81)
RDW: 13.1 % (ref 11.5–15.5)
WBC: 8.9 10*3/uL (ref 4.0–10.5)
nRBC: 0 % (ref 0.0–0.2)

## 2017-12-01 LAB — URINALYSIS, ROUTINE W REFLEX MICROSCOPIC
BILIRUBIN URINE: NEGATIVE
Bacteria, UA: NONE SEEN
GLUCOSE, UA: NEGATIVE mg/dL
KETONES UR: NEGATIVE mg/dL
LEUKOCYTES UA: NEGATIVE
NITRITE: NEGATIVE
Protein, ur: 100 mg/dL — AB
Specific Gravity, Urine: 1.014 (ref 1.005–1.030)
pH: 5 (ref 5.0–8.0)

## 2017-12-01 LAB — BASIC METABOLIC PANEL
Anion gap: 8 (ref 5–15)
BUN: 46 mg/dL — AB (ref 8–23)
CO2: 21 mmol/L — ABNORMAL LOW (ref 22–32)
CREATININE: 1.91 mg/dL — AB (ref 0.61–1.24)
Calcium: 9.3 mg/dL (ref 8.9–10.3)
Chloride: 109 mmol/L (ref 98–111)
GFR calc Af Amer: 37 mL/min — ABNORMAL LOW (ref >60)
GFR, EST NON AFRICAN AMERICAN: 32 mL/min — AB (ref >60)
GLUCOSE: 190 mg/dL — AB (ref 70–99)
Potassium: 4.5 mmol/L (ref 3.5–5.1)
SODIUM: 138 mmol/L (ref 135–145)

## 2017-12-01 MED ORDER — MORPHINE SULFATE (PF) 4 MG/ML IV SOLN
4.0000 mg | Freq: Once | INTRAVENOUS | Status: AC
  Administered 2017-12-01: 4 mg via INTRAVENOUS
  Filled 2017-12-01: qty 1

## 2017-12-01 MED ORDER — IOHEXOL 300 MG/ML  SOLN
75.0000 mL | Freq: Once | INTRAMUSCULAR | Status: AC | PRN
  Administered 2017-12-01: 75 mL via INTRAVENOUS

## 2017-12-01 MED ORDER — HYDROCODONE-ACETAMINOPHEN 5-325 MG PO TABS
1.0000 | ORAL_TABLET | Freq: Once | ORAL | Status: AC
  Administered 2017-12-01: 1 via ORAL
  Filled 2017-12-01: qty 1

## 2017-12-01 MED ORDER — HYDROCODONE-ACETAMINOPHEN 5-325 MG PO TABS: 1.0000 | ORAL_TABLET | Freq: Four times a day (QID) | ORAL | 0 refills | Status: DC | PRN

## 2017-12-01 NOTE — ED Notes (Signed)
Pt was informed that we need a urine sample. Pt states that he can not urinate at this time. 

## 2017-12-01 NOTE — Discharge Instructions (Addendum)
Take Tylenol for mild pain or the pain medicine prescribed for bad pain.  Do not take Tylenol together with the pain medicine prescribed as the combination can be dangerous to your liver.  If you continue to have pain in a week, see your family doctor or call [facility] to be seen.  Return to the emergency department if you develop fever with temperature higher than 100.4, vomiting, or if your pain is not well controlled with the medication prescribed.  Your blood pressure should be rechecked at your doctor's office in a week.  Today's was elevated at 180/77

## 2017-12-01 NOTE — ED Provider Notes (Addendum)
[facility] EMERGENCY DEPARTMENT Provider Note   CSN: [id number] Arrival date & time: 12/01/17  1237     History   Chief Complaint Chief Complaint  Patient presents with  . Flank Pain    HPI [name] [name] is a 84 y.o. male.  HPI Complains of left lower quadrant pain nonradiating onset 9:30 AM today accompanied by several episodes of vomiting, clear material.  No fever.  No urinary symptoms.  Pain is constant nothing makes symptoms better or worse.  He treated himself with gabapentin, without relief.  No other associated symptoms.  No fever.  No urinary symptoms.  No chest pain no shortness of breath.  No flank pain he denies nausea at present Past Medical History:  Diagnosis Date  . Bronchitis    Hx of  . CAD (coronary artery disease)    (2005 LAD 30-40% stenosis  followed by 60-65% focal stenosis.  The second diagonal had 80%   stenosis.  The circumflex had 85-90% mid stenosis and was stented with a  Taxus stent.  The obtuse marginal had 20-25% stenosis and obtuse   marginal-2 had 40-50% stenosis.  The right coronary artery had 40-50%  proximal stenosis and mid 75% stenosis), well-preserved ejection  fraction (50-55% )  . Carotid stenosis   . DM (diabetes mellitus) ([place])   . History of colonic polyps   . HTN (hypertension)   . Myocardial infarction ([place])   . Obesity   . Prostate cancer [facility]) 2001   Radiation   . PUD (peptic ulcer disease)   . Radiation proctitis   . Sigmoid diverticulosis    Hx of  . Sleep apnea    using CPAP   . Sleep apnea     Patient Active Problem List   Diagnosis Date Noted  . Tobacco abuse 01/03/2011  . Pneumonia 01/02/2011  . Anemia 01/02/2011  . Acute renal failure ([place]) 01/02/2011  . Elevated troponin 01/02/2011  . Acute respiratory distress 01/02/2011  . COPD (chronic obstructive pulmonary disease) ([place]) 01/02/2011  . Carotid bruit 05/02/2010  . DYSLIPIDEMIA 03/16/2009  . OBESITY, UNSPECIFIED 03/16/2009  . TOBACCO ABUSE 03/16/2009    . DM type 2, uncontrolled, with renal complications ([place]) 35/70/1779  . HYPERTENSION 03/14/2009  . CAD 03/14/2009  . SLEEP APNEA 03/14/2009    Past Surgical History:  Procedure Laterality Date  . CARDIAC CATHETERIZATION  12/13/2003   ptca, stent x 2  . CHOLECYSTECTOMY    . COLONOSCOPY  05/11/2000  . COLONOSCOPY W/ POLYPECTOMY    . HERNIA REPAIR     right inguinal  . Left Arm repair  1994        Home Medications    Prior to Admission medications   Medication Sig Start Date End Date Taking? Authorizing Provider  amLODipine (NORVASC) 2.5 MG tablet Take 1 tablet (2.5 mg total) by mouth daily. 01/31/14   Minus Breeding, MD  cholestyramine light (PREVALITE) 4 G packet Take 4 g by mouth daily.      [provider]  clopidogrel (PLAVIX) 75 MG tablet Take 1 tablet (75 mg total) by mouth daily. 01/31/14   Minus Breeding, MD  ferrous sulfate 325 (65 FE) MG EC tablet Take 325 mg by mouth 3 (three) times daily with meals.    [provider]  irbesartan-hydrochlorothiazide (AVALIDE) 300-12.5 MG per tablet Take 1 tablet by mouth daily. 01/31/14   Minus Breeding, MD  LEVEMIR FLEXTOUCH 100 UNIT/ML Pen Inject 34 g into the skin daily. 12/29/13   [provider]  metoprolol succinate (TOPROL-XL) 25 MG 24 hr tablet TAKE 1 TABLET BY MOUTH TWICE DAILY 01/31/14   Minus Breeding, MD  omeprazole (PRILOSEC) 20 MG capsule Take 20 mg by mouth daily.      [provider]  saxagliptin HCl (ONGLYZA) 5 MG TABS tablet Take by mouth daily.    [provider]  simvastatin (ZOCOR) 40 MG tablet Take 40 mg by mouth at bedtime.      [provider]  tiotropium (SPIRIVA) 18 MCG inhalation capsule Place 1 capsule (18 mcg total) into inhaler and inhale daily. 01/07/11 01/31/14  Crist Infante, MD    Family History Family History  Problem Relation Age of Onset  . Cancer Father   . Coronary artery disease Father 52  . Coronary artery disease Brother 20  . Coronary  artery disease Sister 25  . Stroke Mother 39    Social History Social History   Tobacco Use  . Smoking status: Current Every Day Smoker    Packs/day: 1.00    Years: 60.00    Pack years: 60.00    Types: Cigarettes  . Smokeless tobacco: Never Used  Substance Use Topics  . Alcohol use: No  . Drug use: No     Allergies   Patient has no known allergies.   Review of Systems Review of Systems  Constitutional: Negative.   HENT: Negative.   Respiratory: Negative.   Cardiovascular: Negative.   Gastrointestinal: Positive for abdominal pain, nausea and vomiting.  Musculoskeletal: Negative.   Skin: Negative.   Allergic/Immunologic: Positive for immunocompromised state.       Diabetic  Neurological: Negative.   Psychiatric/Behavioral: Negative.   All other systems reviewed and are negative.    Physical Exam Updated Vital Signs BP (!) 203/65 (BP Location: Right Arm)   Pulse (!) 54   Temp 97.8 F (36.6 C) (Tympanic)   Resp 20   Ht 5\' 7"  (1.702 m)   Wt 97.5 kg   SpO2 97%   BMI 33.67 kg/m   Physical Exam  Constitutional:  Chronically ill-appearing  HENT:  Head: Normocephalic and atraumatic.  Eyes: Pupils are equal, round, and reactive to light. Conjunctivae are normal.  Neck: Neck supple. No JVD present. No tracheal deviation present. No thyromegaly present.   No Bruit  Cardiovascular: Regular rhythm and normal heart sounds.  No murmur heard. Mildly bradycardic  Pulmonary/Chest: Effort normal and breath sounds normal.  Abdominal: Soft. Bowel sounds are normal. He exhibits no distension and no mass. There is tenderness. There is no guarding. No hernia.  Left lower quadrant tenderness  Genitourinary: Penis normal.  Genitourinary Comments: Normal male genitalia  Musculoskeletal: Normal range of motion. He exhibits no edema or tenderness.  DP pulses 1+ bilaterally.  Radial pulses 1+ bilaterally  Neurological: He is alert. Coordination normal.  Skin: Skin is warm and  dry. No rash noted.  Psychiatric: He has a normal mood and affect.  Nursing note and vitals reviewed.    ED Treatments / Results  Labs (all labs ordered are listed, but only abnormal results are displayed) Labs Reviewed  CBC WITH DIFFERENTIAL/PLATELET - Abnormal; Notable for the following components:      Result Value   Platelets 143 (*)    All other components within normal limits  URINALYSIS, ROUTINE W REFLEX MICROSCOPIC  BASIC METABOLIC PANEL    EKG EKG Interpretation  Date/Time:  Tuesday December 01 2017 14:35:08 EST Ventricular Rate:  56 PR Interval:    QRS Duration: 159  QT Interval:  479 QTC Calculation: 463 R Axis:   82 Text Interpretation:  Sinus rhythm Prolonged PR interval Right bundle branch block Since last tracing rate slower Confirmed by Orlie Dakin [phone]) on 12/01/2017 2:37:10 PM  Results for orders placed or performed during the hospital encounter of 12/01/17  Urinalysis, Routine w reflex microscopic- may I&O cath if menses  Result Value Ref Range   Color, Urine STRAW (A) YELLOW   APPearance CLEAR CLEAR   Specific Gravity, Urine 1.014 1.005 - 1.030   pH 5.0 5.0 - 8.0   Glucose, UA NEGATIVE NEGATIVE mg/dL   Hgb urine dipstick MODERATE (A) NEGATIVE   Bilirubin Urine NEGATIVE NEGATIVE   Ketones, ur NEGATIVE NEGATIVE mg/dL   Protein, ur 100 (A) NEGATIVE mg/dL   Nitrite NEGATIVE NEGATIVE   Leukocytes, UA NEGATIVE NEGATIVE   RBC / HPF 0-5 0 - 5 RBC/hpf   WBC, UA 0-5 0 - 5 WBC/hpf   Bacteria, UA NONE SEEN NONE SEEN   Squamous Epithelial / LPF 0-5 0 - 5   Mucus PRESENT   Basic metabolic panel  Result Value Ref Range   Sodium 138 135 - 145 mmol/L   Potassium 4.5 3.5 - 5.1 mmol/L   Chloride 109 98 - 111 mmol/L   CO2 21 (L) 22 - 32 mmol/L   Glucose, Bld 190 (H) 70 - 99 mg/dL   BUN 46 (H) 8 - 23 mg/dL   Creatinine, Ser 1.91 (H) 0.61 - 1.24 mg/dL   Calcium 9.3 8.9 - 10.3 mg/dL   GFR calc non Af Amer 32 (L) >60 mL/min   GFR calc Af Amer 37 (L) >60  mL/min   Anion gap 8 5 - 15  CBC with Differential  Result Value Ref Range   WBC 8.9 4.0 - 10.5 K/uL   RBC 4.39 4.22 - 5.81 MIL/uL   Hemoglobin 13.4 13.0 - 17.0 g/dL   HCT 41.0 39.0 - 52.0 %   MCV 93.4 80.0 - 100.0 fL   MCH 30.5 26.0 - 34.0 pg   MCHC 32.7 30.0 - 36.0 g/dL   RDW 13.1 11.5 - 15.5 %   Platelets 143 (L) 150 - 400 K/uL   nRBC 0.0 0.0 - 0.2 %   Neutrophils Relative % 75 %   Neutro Abs 6.8 1.7 - 7.7 K/uL   Lymphocytes Relative 18 %   Lymphs Abs 1.6 0.7 - 4.0 K/uL   Monocytes Relative 5 %   Monocytes Absolute 0.4 0.1 - 1.0 K/uL   Eosinophils Relative 0 %   Eosinophils Absolute 0.0 0.0 - 0.5 K/uL   Basophils Relative 1 %   Basophils Absolute 0.1 0.0 - 0.1 K/uL   Immature Granulocytes 1 %   Abs Immature Granulocytes 0.05 0.00 - 0.07 K/uL   Ct Abdomen Pelvis W Contrast  Result Date: 12/01/2017 CLINICAL DATA:  Acute left flank pain. EXAM: CT ABDOMEN AND PELVIS WITH CONTRAST TECHNIQUE: Multidetector CT imaging of the abdomen and pelvis was performed using the standard protocol following bolus administration of intravenous contrast. CONTRAST:  83mL OMNIPAQUE IOHEXOL 300 MG/ML  SOLN COMPARISON:  None. FINDINGS: Lower chest: No acute abnormality. Hepatobiliary: No focal liver abnormality is seen. Status post cholecystectomy. No biliary dilatation. Pancreas: Unremarkable. No pancreatic ductal dilatation or surrounding inflammatory changes. Spleen: Normal in size without focal abnormality. Adrenals/Urinary Tract: Adrenal glands appear normal. Extensive vascular calcifications are seen in both kidneys. Right kidney and ureter are otherwise unremarkable. Mild left hydroureteronephrosis is noted with perinephric and periureteral stranding  without obstructing calculus. Left renal cysts are noted. There is some asymmetrical wall thickening seen posteriorly within the urinary bladder. Stomach/Bowel: Stomach is within normal limits. Appendix appears normal. No evidence of bowel wall thickening,  distention, or inflammatory changes. Sigmoid diverticulosis is noted without inflammation. Vascular/Lymphatic: Aortic atherosclerosis. No enlarged abdominal or pelvic lymph nodes. Reproductive: Prostate is unremarkable. Other: Small fat containing left inguinal hernia is noted. No ventral hernia is noted. Musculoskeletal: No acute or significant osseous findings. IMPRESSION: Mild left hydroureteronephrosis is noted without obstructing calculus. Potentially this may be due to recently passed stone. There is noted a moderate amount of perinephric and periureteral stranding suggesting inflammation, although the possibility of ureteral perforation cannot be excluded. Asymmetrical wall thickening is seen posteriorly within the urinary bladder; cystoscopy is recommended to rule out neoplasm. Sigmoid diverticulosis without inflammation. Small fat containing left inguinal hernia. Aortic Atherosclerosis (ICD10-I70.0). Electronically Signed   By: Marijo Conception, M.D.   On: 12/01/2017 15:12   Vas [place] Burnard Bunting With/wo Tbi  Result Date: 11/11/2017 LOWER EXTREMITY DOPPLER STUDY Indications: Claudication. High Risk Factors: Hypertension, Diabetes.  Performing Technologist: Ronal Fear RVS, RCS  Examination Guidelines: A complete evaluation includes at minimum, Doppler waveform signals and systolic blood pressure reading at the level of bilateral brachial, anterior tibial, and posterior tibial arteries, when vessel segments are accessible. Bilateral testing is considered an integral part of a complete examination. Photoelectric Plethysmograph (PPG) waveforms and toe systolic pressure readings are included as required and additional duplex testing as needed. Limited examinations for reoccurring indications may be performed as noted.  ABI Findings: +---------+------------------+-----+----------+--------+ Right    Rt Pressure (mmHg)IndexWaveform  Comment  +---------+------------------+-----+----------+--------+ Brachial 174                                        +---------+------------------+-----+----------+--------+ PTA      128               0.74 monophasic         +---------+------------------+-----+----------+--------+ DP       135               0.78 monophasic         +---------+------------------+-----+----------+--------+ Great Toe113               0.65                    +---------+------------------+-----+----------+--------+ +---------+------------------+-----+--------+-------+ Left     Lt Pressure (mmHg)IndexWaveformComment +---------+------------------+-----+--------+-------+ Brachial 170                                    +---------+------------------+-----+--------+-------+ PTA      158               0.91 biphasic        +---------+------------------+-----+--------+-------+ DP       151               0.87 biphasic        +---------+------------------+-----+--------+-------+ Great Toe119               0.68                 +---------+------------------+-----+--------+-------+ +-------+-----------+-----------+------------+------------+ ABI/TBIToday's ABIToday's TBIPrevious ABIPrevious TBI +-------+-----------+-----------+------------+------------+ Right  0.78       0.65                                +-------+-----------+-----------+------------+------------+  Left   0.91       0.68                                +-------+-----------+-----------+------------+------------+  Summary: Right: Resting right ankle-brachial index indicates moderate right lower extremity arterial disease. The right toe-brachial index is abnormal. Left: Resting left ankle-brachial index indicates mild left lower extremity arterial disease. The left toe-brachial index is abnormal.  *See table(s) above for measurements and observations.  Electronically signed by Deitra Mayo MD on 11/11/2017 at 8:44:51 AM.   Final    Radiology No results found.  Procedures Procedures  (including critical care time)  Medications Ordered in ED Medications  morphine 4 MG/ML injection 4 mg (has no administration in time range)     Initial Impression / Assessment and Plan / ED Course  I have reviewed the triage vital signs and the nursing notes.  Pertinent labs & imaging results that were available during my care of the patient were reviewed by me and considered in my medical decision making (see chart for details).     220 p.m. pain improved after treatment with intravenous morphine 3:05 PM requesting additional pain medicine.  Additional IV morphine ordered by me  4:30 PM patient continues to have left lower quadrant pain however he states he feels ready to go home.    Lab work consistent with renal insufficiency which is chronic, mild hyperglycemia otherwise unremarkable.  Plan he will get  prior to discharge and prescription for [place] Controlled Substance reporting System queried I discussed case with Dr.Herrick from urology service the patient has continued pain he can be followed up by his PMD or at urology office.  Pain not well controlled or develops fever or persistent vomiting, return to the emergency department.  Suggest blood pressure recheck 1 week. Final Clinical Impressions(s) / ED Diagnoses  Diagnoses #1 ureteral colic #2 elevated blood pressure Final diagnoses:  None   #3 hyperglycemia ED Discharge Orders    None       Orlie Dakin, MD 12/01/17 [place], [place], MD 12/01/17 [phone]

## 2017-12-01 NOTE — ED Triage Notes (Addendum)
Patient complaining of left flank pain and dry heaving starting this morning.

## 2018-01-05 DIAGNOSIS — M79676 Pain in unspecified toe(s): Secondary | ICD-10-CM | POA: Diagnosis not present

## 2018-01-05 DIAGNOSIS — E1142 Type 2 diabetes mellitus with diabetic polyneuropathy: Secondary | ICD-10-CM | POA: Diagnosis not present

## 2018-01-05 DIAGNOSIS — L84 Corns and callosities: Secondary | ICD-10-CM | POA: Diagnosis not present

## 2018-01-05 DIAGNOSIS — B351 Tinea unguium: Secondary | ICD-10-CM | POA: Diagnosis not present

## 2018-02-10 DIAGNOSIS — D509 Iron deficiency anemia, unspecified: Secondary | ICD-10-CM | POA: Diagnosis not present

## 2018-02-10 DIAGNOSIS — M1990 Unspecified osteoarthritis, unspecified site: Secondary | ICD-10-CM | POA: Diagnosis not present

## 2018-02-10 DIAGNOSIS — I10 Essential (primary) hypertension: Secondary | ICD-10-CM | POA: Diagnosis not present

## 2018-02-10 DIAGNOSIS — J449 Chronic obstructive pulmonary disease, unspecified: Secondary | ICD-10-CM | POA: Diagnosis not present

## 2018-02-10 DIAGNOSIS — G4733 Obstructive sleep apnea (adult) (pediatric): Secondary | ICD-10-CM | POA: Diagnosis not present

## 2018-02-10 DIAGNOSIS — I2510 Atherosclerotic heart disease of native coronary artery without angina pectoris: Secondary | ICD-10-CM | POA: Diagnosis not present

## 2018-02-10 DIAGNOSIS — E1151 Type 2 diabetes mellitus with diabetic peripheral angiopathy without gangrene: Secondary | ICD-10-CM | POA: Diagnosis not present

## 2018-02-10 DIAGNOSIS — Z9181 History of falling: Secondary | ICD-10-CM | POA: Diagnosis not present

## 2018-02-10 DIAGNOSIS — F17200 Nicotine dependence, unspecified, uncomplicated: Secondary | ICD-10-CM | POA: Diagnosis not present

## 2018-02-10 DIAGNOSIS — M25561 Pain in right knee: Secondary | ICD-10-CM | POA: Diagnosis not present

## 2018-02-10 DIAGNOSIS — M79606 Pain in leg, unspecified: Secondary | ICD-10-CM | POA: Diagnosis not present

## 2018-02-10 DIAGNOSIS — M6281 Muscle weakness (generalized): Secondary | ICD-10-CM | POA: Diagnosis not present

## 2018-02-21 ENCOUNTER — Encounter (HOSPITAL_COMMUNITY): Payer: Self-pay

## 2018-02-21 ENCOUNTER — Emergency Department (HOSPITAL_COMMUNITY): Payer: Medicare Other

## 2018-02-21 ENCOUNTER — Other Ambulatory Visit: Payer: Self-pay

## 2018-02-21 ENCOUNTER — Emergency Department (HOSPITAL_COMMUNITY)
Admission: EM | Admit: 2018-02-21 | Discharge: 2018-02-21 | Disposition: A | Discharge status: Home / Self Care | Payer: Medicare Other | Attending: Emergency Medicine | Admitting: Emergency Medicine

## 2018-02-21 DIAGNOSIS — E119 Type 2 diabetes mellitus without complications: Secondary | ICD-10-CM | POA: Insufficient documentation

## 2018-02-21 DIAGNOSIS — I2510 Atherosclerotic heart disease of native coronary artery without angina pectoris: Secondary | ICD-10-CM | POA: Diagnosis not present

## 2018-02-21 DIAGNOSIS — R319 Hematuria, unspecified: Secondary | ICD-10-CM | POA: Insufficient documentation

## 2018-02-21 DIAGNOSIS — J449 Chronic obstructive pulmonary disease, unspecified: Secondary | ICD-10-CM | POA: Insufficient documentation

## 2018-02-21 DIAGNOSIS — Z8546 Personal history of malignant neoplasm of prostate: Secondary | ICD-10-CM | POA: Diagnosis not present

## 2018-02-21 DIAGNOSIS — Z923 Personal history of irradiation: Secondary | ICD-10-CM | POA: Diagnosis not present

## 2018-02-21 DIAGNOSIS — R350 Frequency of micturition: Secondary | ICD-10-CM | POA: Diagnosis not present

## 2018-02-21 DIAGNOSIS — F17210 Nicotine dependence, cigarettes, uncomplicated: Secondary | ICD-10-CM | POA: Diagnosis not present

## 2018-02-21 DIAGNOSIS — N200 Calculus of kidney: Secondary | ICD-10-CM | POA: Diagnosis not present

## 2018-02-21 DIAGNOSIS — I10 Essential (primary) hypertension: Secondary | ICD-10-CM | POA: Insufficient documentation

## 2018-02-21 LAB — COMPREHENSIVE METABOLIC PANEL
ALBUMIN: 3.7 g/dL (ref 3.5–5.0)
ALK PHOS: 49 U/L (ref 38–126)
ALT: 13 U/L (ref 0–44)
AST: 17 U/L (ref 15–41)
Anion gap: 7 (ref 5–15)
BILIRUBIN TOTAL: 0.6 mg/dL (ref 0.3–1.2)
BUN: 35 mg/dL — AB (ref 8–23)
CALCIUM: 8.9 mg/dL (ref 8.9–10.3)
CO2: 23 mmol/L (ref 22–32)
CREATININE: 1.47 mg/dL — AB (ref 0.61–1.24)
Chloride: 110 mmol/L (ref 98–111)
GFR calc Af Amer: 50 mL/min — ABNORMAL LOW (ref >60)
GFR, EST NON AFRICAN AMERICAN: 43 mL/min — AB (ref >60)
Glucose, Bld: 149 mg/dL — ABNORMAL HIGH (ref 70–99)
POTASSIUM: 4.1 mmol/L (ref 3.5–5.1)
Sodium: 140 mmol/L (ref 135–145)
Total Protein: 6.6 g/dL (ref 6.5–8.1)

## 2018-02-21 LAB — URINALYSIS, ROUTINE W REFLEX MICROSCOPIC
Bilirubin Urine: NEGATIVE
GLUCOSE, UA: NEGATIVE mg/dL
Nitrite: POSITIVE — AB
PH: 5 (ref 5.0–8.0)
Protein, ur: 100 mg/dL — AB
SPECIFIC GRAVITY, URINE: 1.02 (ref 1.005–1.030)

## 2018-02-21 LAB — CBC WITH DIFFERENTIAL/PLATELET
ABS IMMATURE GRANULOCYTES: 0.03 10*3/uL (ref 0.00–0.07)
BASOS PCT: 1 %
Basophils Absolute: 0 10*3/uL (ref 0.0–0.1)
EOS PCT: 1 %
Eosinophils Absolute: 0.1 10*3/uL (ref 0.0–0.5)
HCT: 40 % (ref 39.0–52.0)
Hemoglobin: 12.6 g/dL — ABNORMAL LOW (ref 13.0–17.0)
Immature Granulocytes: 1 %
Lymphocytes Relative: 26 %
Lymphs Abs: 1.5 10*3/uL (ref 0.7–4.0)
MCH: 30.6 pg (ref 26.0–34.0)
MCHC: 31.5 g/dL (ref 30.0–36.0)
MCV: 97.1 fL (ref 80.0–100.0)
MONO ABS: 0.5 10*3/uL (ref 0.1–1.0)
MONOS PCT: 8 %
NEUTROS ABS: 3.7 10*3/uL (ref 1.7–7.7)
Neutrophils Relative %: 63 %
Platelets: 134 10*3/uL — ABNORMAL LOW (ref 150–400)
RBC: 4.12 MIL/uL — ABNORMAL LOW (ref 4.22–5.81)
RDW: 13.2 % (ref 11.5–15.5)
WBC: 5.8 10*3/uL (ref 4.0–10.5)
nRBC: 0 % (ref 0.0–0.2)

## 2018-02-21 LAB — URINALYSIS, MICROSCOPIC (REFLEX)

## 2018-02-21 NOTE — ED Triage Notes (Signed)
Pt reports hematuria since 3 am.  Reports had a kidney stone a few weeks ago.  Denies any pain.

## 2018-02-21 NOTE — Discharge Instructions (Signed)
You were seen today with blood in the urine. Call your Urologist first thing tomorrow to schedule a follow up appointment. Return to the ED immediately with any fever, chills, or inability to urinate.

## 2018-02-21 NOTE — ED Provider Notes (Signed)
Emergency Department Provider Note   I have reviewed the triage vital signs and the nursing notes.   HISTORY  Chief Complaint Hematuria   HPI [name] [name] is a 85 y.o. male with PMH of CAD, DM, HTN, prostate cancer history, and PUD presents to the emergency department with painless hematuria starting at 3 AM.  Patient reports red urine without urinary retention/obstruction symptoms.  Patient has a prior history of kidney stone with the last several months but denies pain during this episode.  He is not experiencing fevers, chills, dysuria.  He has had some urine frequency but states that is not terribly unusual.  Patient does take Plavix.  He denies any chest pain, shortness of breath, headache, other constitutional symptoms.   Past Medical History:  Diagnosis Date  . Bronchitis    Hx of  . CAD (coronary artery disease)    (2005 LAD 30-40% stenosis  followed by 60-65% focal stenosis.  The second diagonal had 80%   stenosis.  The circumflex had 85-90% mid stenosis and was stented with a  Taxus stent.  The obtuse marginal had 20-25% stenosis and obtuse   marginal-2 had 40-50% stenosis.  The right coronary artery had 40-50%  proximal stenosis and mid 75% stenosis), well-preserved ejection  fraction (50-55% )  . Carotid stenosis   . DM (diabetes mellitus) ([place])   . History of colonic polyps   . HTN (hypertension)   . Myocardial infarction ([place])   . Obesity   . Prostate cancer [facility]) 2001   Radiation   . PUD (peptic ulcer disease)   . Radiation proctitis   . Sigmoid diverticulosis    Hx of  . Sleep apnea    using CPAP   . Sleep apnea     Patient Active Problem List   Diagnosis Date Noted  . Tobacco abuse 01/03/2011  . Pneumonia 01/02/2011  . Anemia 01/02/2011  . Acute renal failure ([place]) 01/02/2011  . Elevated troponin 01/02/2011  . Acute respiratory distress 01/02/2011  . COPD (chronic obstructive pulmonary disease) ([place]) 01/02/2011  . Carotid bruit 05/02/2010  .  DYSLIPIDEMIA 03/16/2009  . OBESITY, UNSPECIFIED 03/16/2009  . TOBACCO ABUSE 03/16/2009  . DM type 2, uncontrolled, with renal complications ([place]) 05/69/7948  . HYPERTENSION 03/14/2009  . CAD 03/14/2009  . SLEEP APNEA 03/14/2009    Past Surgical History:  Procedure Laterality Date  . CARDIAC CATHETERIZATION  12/13/2003   ptca, stent x 2  . CHOLECYSTECTOMY    . COLONOSCOPY  05/11/2000  . COLONOSCOPY W/ POLYPECTOMY    . HERNIA REPAIR     right inguinal  . Left Arm repair  1994   Allergies Patient has no known allergies.  Family History  Problem Relation Age of Onset  . Cancer Father   . Coronary artery disease Father 91  . Coronary artery disease Brother 18  . Coronary artery disease Sister 58  . Stroke Mother 71    Social History Social History   Tobacco Use  . Smoking status: Current Every Day Smoker    Packs/day: 1.00    Years: 60.00    Pack years: 60.00    Types: Cigarettes  . Smokeless tobacco: Never Used  Substance Use Topics  . Alcohol use: No  . Drug use: No    Review of Systems  Constitutional: No fever/chills Eyes: No visual changes. ENT: No sore throat. Cardiovascular: Denies chest pain. Respiratory: Denies shortness of breath. Gastrointestinal: No abdominal pain.  No nausea, no vomiting.  No  diarrhea.  No constipation. Genitourinary: Negative for dysuria. Positive hematuria.  Musculoskeletal: Negative for back pain. Skin: Negative for rash. Neurological: Negative for headaches, focal weakness or numbness.  10-point ROS otherwise negative.  ____________________________________________   PHYSICAL EXAM:  VITAL SIGNS: ED Triage Vitals  Enc Vitals Group     BP 02/21/18 0853 (!) 169/66     Pulse Rate 02/21/18 0853 61     Resp 02/21/18 0853 18     Temp 02/21/18 0853 98.1 F (36.7 C)     Temp Source 02/21/18 0853 Oral     SpO2 02/21/18 0853 95 %     Weight 02/21/18 0850 212 lb (96.2 kg)     Height 02/21/18 0850 5\' 7"  (1.702 m)    Constitutional: Alert and oriented. Well appearing and in no acute distress. Eyes: Conjunctivae are normal.  Head: Atraumatic. Nose: No congestion/rhinnorhea. Mouth/Throat: Mucous membranes are moist.  Neck: No stridor.  Cardiovascular: Normal rate, regular rhythm. Good peripheral circulation. Grossly normal heart sounds.   Respiratory: Normal respiratory effort.  No retractions. Lungs CTAB. Gastrointestinal: Soft and nontender. No distention. No CVA tenderness.  Musculoskeletal: No lower extremity tenderness nor edema. No gross deformities of extremities. Neurologic:  Normal speech and language. No gross focal neurologic deficits are appreciated.  Skin:  Skin is warm, dry and intact. No rash noted.  ____________________________________________   LABS (all labs ordered are listed, but only abnormal results are displayed)  Labs Reviewed  URINALYSIS, ROUTINE W REFLEX MICROSCOPIC - Abnormal; Notable for the following components:      Result Value   Color, Urine RED (*)    APPearance HAZY (*)    Hgb urine dipstick LARGE (*)    Ketones, ur TRACE (*)    Protein, ur 100 (*)    Nitrite POSITIVE (*)    Leukocytes,Ua SMALL (*)    All other components within normal limits  COMPREHENSIVE METABOLIC PANEL - Abnormal; Notable for the following components:   Glucose, Bld 149 (*)    BUN 35 (*)    Creatinine, Ser 1.47 (*)    GFR calc non Af Amer 43 (*)    GFR calc Af Amer 50 (*)    All other components within normal limits  CBC WITH DIFFERENTIAL/PLATELET - Abnormal; Notable for the following components:   RBC 4.12 (*)    Hemoglobin 12.6 (*)    Platelets 134 (*)    All other components within normal limits  URINALYSIS, MICROSCOPIC (REFLEX) - Abnormal; Notable for the following components:   Bacteria, UA RARE (*)    All other components within normal limits  URINE CULTURE   ____________________________________________  RADIOLOGY  Ct Renal Stone Study  Result Date:  02/21/2018 CLINICAL DATA:  Hematuria since 3 a.m.  No pain. EXAM: CT ABDOMEN AND PELVIS WITHOUT CONTRAST TECHNIQUE: Multidetector CT imaging of the abdomen and pelvis was performed following the standard protocol without IV contrast. COMPARISON:  December 01, 2017 FINDINGS: Lower chest: No acute abnormality. Hepatobiliary: Previous cholecystectomy. The liver is otherwise unremarkable. Pancreas: Unremarkable. No pancreatic ductal dilatation or surrounding inflammatory changes. Spleen: Normal in size without focal abnormality. Adrenals/Urinary Tract: Thickening of the left adrenal gland is likely due to hyperplasia. A low-attenuation nodule in the right adrenal gland with an attenuation of 5 Hounsfield units is consistent with a small adenoma. Bilateral renal stones are identified without hydronephrosis. There are 2 cysts in the left kidney. Bilateral perinephric stranding is stable and nonacute. No ureteral stones or ureterectasis identified. Visualized portions of the  bladder are unremarkable. Stomach/Bowel: The stomach and small bowel are normal. Colonic diverticulosis is seen without diverticulitis. The appendix is normal. Vascular/Lymphatic: Atherosclerotic change seen in the nonaneurysmal aorta. No adenopathy. Reproductive: Prostate is unremarkable. Other: No abdominal wall hernia or abnormality. No abdominopelvic ascites. Musculoskeletal: No acute or significant osseous findings. IMPRESSION: 1. Nonobstructive stones in both kidneys.  No ureteral stones. 2. Left adrenal hyperplasia.  Small right adrenal adenoma. 3. Colonic diverticulosis without diverticulitis. 4. Atherosclerotic change in the nonaneurysmal aorta. Electronically Signed   By: Dorise Bullion III M.D   On: 02/21/2018 10:55    ____________________________________________   PROCEDURES  Procedure(s) performed:   Procedures  None  ____________________________________________   INITIAL IMPRESSION / ASSESSMENT AND PLAN / ED  COURSE  Pertinent labs & imaging results that were available during my care of the patient were reviewed by me and considered in my medical decision making (see chart for details).  Patient presents to the emergency department for evaluation of painless hematuria.  Patient is on Plavix.  He is not experiencing any urinary obstruction symptoms.  No UTI symptoms.  No flank pain similar to his prior kidney stone.  No vomiting or diarrhea.  Patient is very well-appearing.  He does have history of prostate cancer which required radiation.  Plan for screening labs, CT renal, UA with culture.  Urine at bedside shows gross hematuria that is thin and without obvious clot sediment.   Lab work reviewed.  He has read, clear urine with rare bacteria UTI symptoms.  Urine is nitrite positive which is likely related to color of the urine.  Do plan to send for culture.  Kidney function is slightly elevated.  No other acute findings.  CT imaging with bilateral renal stones but no ureteral stone.  I advised very close urology follow-up and provided contact information.  Discussed the signs and symptoms of urinary retention and encourage patient to return to the emergency department if these develop.  ____________________________________________  FINAL CLINICAL IMPRESSION(S) / ED DIAGNOSES  Final diagnoses:  Hematuria, unspecified type    Note:  This document was prepared using Dragon voice recognition software and may include unintentional dictation errors.  Nanda Quinton, MD Emergency Medicine    Deijah Spikes, [facility], MD 02/21/18 [phone]

## 2018-02-23 LAB — URINE CULTURE
CULTURE: NO GROWTH
SPECIAL REQUESTS: NORMAL

## 2018-03-04 DIAGNOSIS — R310 Gross hematuria: Secondary | ICD-10-CM | POA: Diagnosis not present

## 2018-03-04 DIAGNOSIS — C61 Malignant neoplasm of prostate: Secondary | ICD-10-CM | POA: Diagnosis not present

## 2018-03-16 DIAGNOSIS — E1142 Type 2 diabetes mellitus with diabetic polyneuropathy: Secondary | ICD-10-CM | POA: Diagnosis not present

## 2018-03-16 DIAGNOSIS — L84 Corns and callosities: Secondary | ICD-10-CM | POA: Diagnosis not present

## 2018-03-16 DIAGNOSIS — M79676 Pain in unspecified toe(s): Secondary | ICD-10-CM | POA: Diagnosis not present

## 2018-03-16 DIAGNOSIS — B351 Tinea unguium: Secondary | ICD-10-CM | POA: Diagnosis not present

## 2018-03-19 ENCOUNTER — Encounter: Payer: Self-pay | Admitting: Cardiology

## 2018-03-19 DIAGNOSIS — J449 Chronic obstructive pulmonary disease, unspecified: Secondary | ICD-10-CM | POA: Diagnosis not present

## 2018-03-19 DIAGNOSIS — I2510 Atherosclerotic heart disease of native coronary artery without angina pectoris: Secondary | ICD-10-CM | POA: Diagnosis not present

## 2018-03-19 DIAGNOSIS — Z01818 Encounter for other preprocedural examination: Secondary | ICD-10-CM | POA: Diagnosis not present

## 2018-03-19 DIAGNOSIS — Z6832 Body mass index (BMI) 32.0-32.9, adult: Secondary | ICD-10-CM | POA: Diagnosis not present

## 2018-03-19 DIAGNOSIS — Z1331 Encounter for screening for depression: Secondary | ICD-10-CM | POA: Diagnosis not present

## 2018-03-19 DIAGNOSIS — E1151 Type 2 diabetes mellitus with diabetic peripheral angiopathy without gangrene: Secondary | ICD-10-CM | POA: Diagnosis not present

## 2018-03-19 DIAGNOSIS — D494 Neoplasm of unspecified behavior of bladder: Secondary | ICD-10-CM | POA: Diagnosis not present

## 2018-03-19 DIAGNOSIS — F17200 Nicotine dependence, unspecified, uncomplicated: Secondary | ICD-10-CM | POA: Diagnosis not present

## 2018-03-19 DIAGNOSIS — I10 Essential (primary) hypertension: Secondary | ICD-10-CM | POA: Diagnosis not present

## 2018-03-19 DIAGNOSIS — I6523 Occlusion and stenosis of bilateral carotid arteries: Secondary | ICD-10-CM | POA: Diagnosis not present

## 2018-03-19 DIAGNOSIS — N183 Chronic kidney disease, stage 3 (moderate): Secondary | ICD-10-CM | POA: Diagnosis not present

## 2018-03-19 DIAGNOSIS — R51 Headache: Secondary | ICD-10-CM | POA: Diagnosis not present

## 2018-03-22 ENCOUNTER — Other Ambulatory Visit: Payer: Self-pay | Admitting: Urology

## 2018-03-23 DIAGNOSIS — C44622 Squamous cell carcinoma of skin of right upper limb, including shoulder: Secondary | ICD-10-CM | POA: Diagnosis not present

## 2018-03-30 NOTE — Patient Instructions (Signed)
JAQWON MANFRED  03/30/2018   Your procedure is scheduled on: 04-05-18    Report to [facility] Main  Entrance    Report to Admitting at 6:00 AM    Call this number if you have problems the morning of surgery [phone]    Remember: Do not eat food or drink liquids :After Midnight.    BRUSH YOUR TEETH MORNING OF SURGERY AND RINSE YOUR MOUTH OUT, NO CHEWING GUM CANDY OR MINTS.     Take these medicines the morning of surgery with A SIP OF WATER: Amlodipine (Norvasc), Metoprolol Succinate (Toprol-XL), Omeprazole (Prilosec), and Rosuvastatin (Crestor)                                You may not have any metal on your body including hair pins and              piercings  Do not wear jewelry, cologne, lotions, powders or deodorant             Do not wear nail polish.  Do not shave  48 hours prior to surgery.                 Do not bring valuables to the hospital. [place].  Contacts, dentures or bridgework may not be worn into surgery.       Patients discharged the day of surgery will not be allowed to drive home. IF YOU ARE HAVING SURGERY AND GOING HOME THE SAME DAY, YOU MUST HAVE AN ADULT TO DRIVE YOU HOME AND BE WITH YOU FOR 24 HOURS. YOU MAY GO HOME BY TAXI OR UBER OR ORTHERWISE, BUT AN ADULT MUST ACCOMPANY YOU HOME AND STAY WITH YOU FOR 24 HOURS.    ame and phone number of your driver:  Special Instructions: N/A              Please read over the following fact sheets you were given: _____________________________________________________________________             How to Manage Your Diabetes Before and After Surgery  Why is it important to control my blood sugar before and after surgery? . Improving blood sugar levels before and after surgery helps healing and can limit problems. . A way of improving blood sugar control is eating a healthy diet by: o  Eating less sugar and carbohydrates o  Increasing  activity/exercise o  Talking with your doctor about reaching your blood sugar goals . High blood sugars (greater than 180 mg/dL) can raise your risk of infections and slow your recovery, so you will need to focus on controlling your diabetes during the weeks before surgery. . Make sure that the doctor who takes care of your diabetes knows about your planned surgery including the date and location.  How do I manage my blood sugar before surgery? . Check your blood sugar at least 4 times a day, starting 2 days before surgery, to make sure that the level is not too high or low. o Check your blood sugar the morning of your surgery when you wake up and every 2 hours until you get to the Short Stay unit. . If your blood sugar is less than 70 mg/dL, you will need to  treat for low blood sugar: o Do not take insulin. o Treat a low blood sugar (less than 70 mg/dL) with  cup of clear juice (cranberry or apple), 4 glucose tablets, OR glucose gel. o Recheck blood sugar in 15 minutes after treatment (to make sure it is greater than 70 mg/dL). If your blood sugar is not greater than 70 mg/dL on recheck, call [phone] for further instructions. . Report your blood sugar to the short stay nurse when you get to Short Stay.  . If you are admitted to the hospital after surgery: o Your blood sugar will be checked by the staff and you will probably be given insulin after surgery (instead of oral diabetes medicines) to make sure you have good blood sugar levels. o The goal for blood sugar control after surgery is 80-180 mg/dL.   WHAT DO I DO ABOUT MY DIABETES MEDICATION?  Marland Kitchen Do not take oral diabetes medicines (pills) the morning of surgery.  . THE DAY BEFORE SURGERY, take your Tradjenta, and 34-36 units of Lantus  insulin. (Verify with patient)     . The day of surgery, do not take other diabetes injectables, including Byetta (exenatide), Bydureon (exenatide ER), Victoza (liraglutide), or Trulicity  (dulaglutide).      [facility] - Preparing for Surgery Before surgery, you can play an important role.  Because skin is not sterile, your skin needs to be as free of germs as possible.  You can reduce the number of germs on your skin by washing with CHG (chlorahexidine gluconate) soap before surgery.  CHG is an antiseptic cleaner which kills germs and bonds with the skin to continue killing germs even after washing. Please DO NOT use if you have an allergy to CHG or antibacterial soaps.  If your skin becomes reddened/irritated stop using the CHG and inform your nurse when you arrive at Short Stay. Do not shave (including legs and underarms) for at least 48 hours prior to the first CHG shower.  You may shave your face/neck. Please follow these instructions carefully:  1.  Shower with CHG Soap the night before surgery and the  morning of Surgery.  2.  If you choose to wash your hair, wash your hair first as usual with your  normal  shampoo.  3.  After you shampoo, rinse your hair and body thoroughly to remove the  shampoo.                           4.  Use CHG as you would any other liquid soap.  You can apply chg directly  to the skin and wash                       Gently with a scrungie or clean washcloth.  5.  Apply the CHG Soap to your body ONLY FROM THE NECK DOWN.   Do not use on face/ open                           Wound or open sores. Avoid contact with eyes, ears mouth and genitals (private parts).                       Wash face,  Genitals (private parts) with your normal soap.             6.  Wash thoroughly, paying special attention to  the area where your surgery  will be performed.  7.  Thoroughly rinse your body with warm water from the neck down.  8.  DO NOT shower/wash with your normal soap after using and rinsing off  the CHG Soap.                9.  Pat yourself dry with a clean towel.            10.  Wear clean pajamas.            11.  Place clean sheets on your bed the night of  your first shower and do not  sleep with pets. Day of Surgery : Do not apply any lotions/deodorants the morning of surgery.  Please wear clean clothes to the hospital/surgery center.  FAILURE TO FOLLOW THESE INSTRUCTIONS MAY RESULT IN THE CANCELLATION OF YOUR SURGERY PATIENT SIGNATURE_________________________________  NURSE SIGNATURE__________________________________  ________________________________________________________________________

## 2018-03-30 NOTE — Progress Notes (Signed)
12-02-17 (Epic) EKG 

## 2018-03-31 ENCOUNTER — Inpatient Hospital Stay (HOSPITAL_COMMUNITY)
Admission: RE | Admit: 2018-03-31 | Discharge: 2018-03-31 | Disposition: A | Discharge status: Home / Self Care | Payer: Medicare Other | Source: Ambulatory Visit

## 2018-04-01 MED ORDER — GEMCITABINE CHEMO FOR BLADDER INSTILLATION 2000 MG: 2000.0000 mg | Freq: Once | INTRAVENOUS | Status: AC

## 2018-04-30 NOTE — Progress Notes (Signed)
12-02-17 (Epic) EKG

## 2018-04-30 NOTE — Patient Instructions (Addendum)
[name] [name]  04/30/2018   Your procedure is scheduled on: 05-13-18    Report to [facility] Main  Entrance    Report to Admitting at 9:30 AM    Call this number if you have problems the morning of surgery [phone]   NO SMOKING FOR 24 HOURS BEFORE SURGERY    Remember: Do not eat food or drink liquids :After Midnight.    BRUSH YOUR TEETH MORNING OF SURGERY AND RINSE YOUR MOUTH OUT, NO CHEWING GUM CANDY OR MINTS.     Take these medicines the morning of surgery with A SIP OF WATER: Amlodipine (Norvasc), Metoprolol succinate (Toprol -XL), and Omeprazole (Prilosec)      DO NOT TAKE ANY DIABETIC MEDICATIONS DAY OF YOUR SURGERY                               You may not have any metal on your body including hair pins and              piercings  Do not wear jewelry, cologne, lotions, powders or deodorant             Men may shave face and neck.   Do not bring valuables to the hospital. [place].  Contacts, dentures or bridgework may not be worn into surgery.  Leave suitcase in the car. After surgery it may be brought to your room.     Patients discharged the day of surgery will not be allowed to drive home. IF YOU ARE HAVING SURGERY AND GOING HOME THE SAME DAY, YOU MUST HAVE AN ADULT TO DRIVE YOU HOME AND BE WITH YOU FOR 24 HOURS. YOU MAY GO HOME BY TAXI OR UBER OR ORTHERWISE, BUT AN ADULT MUST ACCOMPANY YOU HOME AND STAY WITH YOU FOR 24 HOURS.  Name and phone number of your driver:  Special Instructions: N/A              Please read over the following fact sheets you were given: _____________________________________________________________________  How to Manage Your Diabetes Before and After Surgery  Why is it important to control my blood sugar before and after surgery? . Improving blood sugar levels before and after surgery helps healing and can limit problems. . A way of improving blood sugar  control is eating a healthy diet by: o  Eating less sugar and carbohydrates o  Increasing activity/exercise o  Talking with your doctor about reaching your blood sugar goals . High blood sugars (greater than 180 mg/dL) can raise your risk of infections and slow your recovery, so you will need to focus on controlling your diabetes during the weeks before surgery. . Make sure that the doctor who takes care of your diabetes knows about your planned surgery including the date and location.  How do I manage my blood sugar before surgery? . Check your blood sugar at least 4 times a day, starting 2 days before surgery, to make sure that the level is not too high or low. o Check your blood sugar the morning of your surgery when you wake up and every 2 hours until you get to the Short Stay unit. . If your blood sugar is less than 70 mg/dL, you will need to treat  for low blood sugar: o Do not take insulin. o Treat a low blood sugar (less than 70 mg/dL) with  cup of clear juice (cranberry or apple), 4 glucose tablets, OR glucose gel. o Recheck blood sugar in 15 minutes after treatment (to make sure it is greater than 70 mg/dL). If your blood sugar is not greater than 70 mg/dL on recheck, call [phone] for further instructions. . Report your blood sugar to the short stay nurse when you get to Short Stay.  . If you are admitted to the hospital after surgery: o Your blood sugar will be checked by the staff and you will probably be given insulin after surgery (instead of oral diabetes medicines) to make sure you have good blood sugar levels. o The goal for blood sugar control after surgery is 80-180 mg/dL.   WHAT DO I DO ABOUT MY DIABETES MEDICATION?  Marland Kitchen Do not take oral diabetes medicines (pills) the morning of surgery.  . THE DAY BEFORE SURGERY, take your usual dose of Tradjenta.       . THE NIGHT BEFORE SURGERY take  ONLY 17-18  units of your bedtime dose of Lantus   insulin.    Reviewed and  Endorsed by [facility] Patient Education Committee, August 2015           [facility] - Preparing for Surgery Before surgery, you can play an important role.  Because skin is not sterile, your skin needs to be as free of germs as possible.  You can reduce the number of germs on your skin by washing with CHG (chlorahexidine gluconate) soap before surgery.  CHG is an antiseptic cleaner which kills germs and bonds with the skin to continue killing germs even after washing. Please DO NOT use if you have an allergy to CHG or antibacterial soaps.  If your skin becomes reddened/irritated stop using the CHG and inform your nurse when you arrive at Short Stay. Do not shave (including legs and underarms) for at least 48 hours prior to the first CHG shower.  You may shave your face/neck. Please follow these instructions carefully:  1.  Shower with CHG Soap the night before surgery and the  morning of Surgery.  2.  If you choose to wash your hair, wash your hair first as usual with your  normal  shampoo.  3.  After you shampoo, rinse your hair and body thoroughly to remove the  shampoo.                           4.  Use CHG as you would any other liquid soap.  You can apply chg directly  to the skin and wash                       Gently with a scrungie or clean washcloth.  5.  Apply the CHG Soap to your body ONLY FROM THE NECK DOWN.   Do not use on face/ open                           Wound or open sores. Avoid contact with eyes, ears mouth and genitals (private parts).                       Wash face,  Genitals (private parts) with your normal soap.  6.  Wash thoroughly, paying special attention to the area where your surgery  will be performed.  7.  Thoroughly rinse your body with warm water from the neck down.  8.  DO NOT shower/wash with your normal soap after using and rinsing off  the CHG Soap.                9.  Pat yourself dry with a clean towel.            10.  Wear clean pajamas.             11.  Place clean sheets on your bed the night of your first shower and do not  sleep with pets. Day of Surgery : Do not apply any lotions/deodorants the morning of surgery.  Please wear clean clothes to the hospital/surgery center.  FAILURE TO FOLLOW THESE INSTRUCTIONS MAY RESULT IN THE CANCELLATION OF YOUR SURGERY PATIENT SIGNATURE_________________________________  NURSE SIGNATURE__________________________________  ________________________________________________________________________

## 2018-05-03 ENCOUNTER — Encounter (HOSPITAL_COMMUNITY): Payer: Self-pay

## 2018-05-03 ENCOUNTER — Other Ambulatory Visit: Payer: Self-pay

## 2018-05-03 ENCOUNTER — Encounter (HOSPITAL_COMMUNITY)
Admission: RE | Admit: 2018-05-03 | Discharge: 2018-05-03 | Disposition: A | Discharge status: Home / Self Care | Payer: Medicare Other | Source: Ambulatory Visit | Attending: Urology | Admitting: Urology

## 2018-05-03 DIAGNOSIS — Z01812 Encounter for preprocedural laboratory examination: Secondary | ICD-10-CM | POA: Diagnosis not present

## 2018-05-03 HISTORY — DX: Unspecified malignant neoplasm of skin, unspecified: C44.90

## 2018-05-03 HISTORY — DX: Tobacco use: Z72.0

## 2018-05-03 HISTORY — DX: Unspecified right bundle-branch block: I45.10

## 2018-05-03 HISTORY — DX: Shortness of breath: R06.02

## 2018-05-03 LAB — BASIC METABOLIC PANEL
Anion gap: 8 (ref 5–15)
BUN: 40 mg/dL — ABNORMAL HIGH (ref 8–23)
CO2: 25 mmol/L (ref 22–32)
Calcium: 9.6 mg/dL (ref 8.9–10.3)
Chloride: 106 mmol/L (ref 98–111)
Creatinine, Ser: 1.6 mg/dL — ABNORMAL HIGH (ref 0.61–1.24)
GFR calc Af Amer: 45 mL/min — ABNORMAL LOW (ref >60)
GFR calc non Af Amer: 39 mL/min — ABNORMAL LOW (ref >60)
Glucose, Bld: 105 mg/dL — ABNORMAL HIGH (ref 70–99)
Potassium: 4.7 mmol/L (ref 3.5–5.1)
Sodium: 139 mmol/L (ref 135–145)

## 2018-05-03 LAB — CBC
HCT: 41 % (ref 39.0–52.0)
Hemoglobin: 12.9 g/dL — ABNORMAL LOW (ref 13.0–17.0)
MCH: 29.5 pg (ref 26.0–34.0)
MCHC: 31.5 g/dL (ref 30.0–36.0)
MCV: 93.6 fL (ref 80.0–100.0)
Platelets: 162 10*3/uL (ref 150–400)
RBC: 4.38 MIL/uL (ref 4.22–5.81)
RDW: 12.7 % (ref 11.5–15.5)
WBC: 8.8 10*3/uL (ref 4.0–10.5)
nRBC: 0 % (ref 0.0–0.2)

## 2018-05-03 LAB — GLUCOSE, CAPILLARY: Glucose-Capillary: 92 mg/dL (ref 70–99)

## 2018-05-03 LAB — HEMOGLOBIN A1C
Hgb A1c MFr Bld: 5.8 % — ABNORMAL HIGH (ref 4.8–5.6)
Mean Plasma Glucose: 119.76 mg/dL

## 2018-05-03 NOTE — Progress Notes (Signed)
[name] [name], [place] [facility] contacted for anesthesia consult. [name] [name] reviewed patient chart and recommends patient needs cardiac clearance. Per [name] [name], he will reach out to surgeon and inform that patient will need cardiac clearance. RN made patient aware . Patient verbalized understanding

## 2018-05-04 DIAGNOSIS — Z85828 Personal history of other malignant neoplasm of skin: Secondary | ICD-10-CM | POA: Diagnosis not present

## 2018-05-04 DIAGNOSIS — Z08 Encounter for follow-up examination after completed treatment for malignant neoplasm: Secondary | ICD-10-CM | POA: Diagnosis not present

## 2018-05-04 NOTE — Progress Notes (Addendum)
Anesthesia Chart Review   Case:  [id number] Date/Time:  05/13/18 1115   Procedures:      TRANSURETHRAL RESECTION OF BLADDER TUMOR WITH MITOMYCIN-C (N/A ) - 35 MNS     CYSTOSCOPY WITH RETROGRADE PYELOGRAM (Bilateral )   Anesthesia type:  General   Pre-op diagnosis:  BLADDER CANCER   Location:  [place] / [facility]   Surgeon:  Franchot Gallo, MD      DISCUSSION:85 yo current every day smoker (60 pack years) with h/o COPD, CAD (stents x2), PVD, HTN, carotid stenosis ([place] 8/18 Right: 60-79% ICA stenosis), sleep apnea w/o device, DM II, RBBB, PUD, prostate cancer (s/p radiation), bladder cancer scheduled for above procedure 05/13/18 with Dr. Franchot Gallo.    Pt last seen by cardiologist, Dr. Minus Breeding, 01/31/2014.  Requested cardiac clearance due to h/o previous stents, last cath 2005, carotid stenosis with last doppler 2018.  Pt was seen by PCP, Ravisankar Avva, 03/19/18 (OV note on chart). Per his note, "Patient is currently on Plavix, and discussed risk of vascular and pulmonary perioperative complications, discussed getting cardiology clearance however patient defers and acknowledges the risk and wishes to stop Plavix as recommended by urology for 1 week acknowledging the risk."  Discussed with Dr. Tobias Alexander who states patient needs cardiac clearance to proceed.  Dr. Alan Ripper scheduler made aware.      Addendum 05/07/2018 Pt seen by cardiologist, Dr. Percival Spanish, via telemedicine today.  Per his note, "PREOP: I had a long discussion with the patient and his daughter about his perioperative risk.  This is not a high risk procedure.  According to [facility] criteria he be a low risk for this type of procedure.  According to modified Truman Hayward criteria he is at high risk because of his vascular disease and diabetes.  We will treat this in between and he is at moderate risk but it is completely acceptable for the benefit offered.  I think he is at low risk for MI.  However, with his bradycardia  arrhythmia this is always a possibility and should be followed and monitored during this short procedure in the recovery area.  No further testing or change in therapy is indicated.  He should proceed with the procedure.  Pt can proceed with planned procedure barring acute status change.  VS: BP (!) 118/59   Pulse (!) 53   Temp 36.7 C (Oral)   Resp 18   Ht 5\' 4"  (1.626 m)   Wt 88 kg   SpO2 96%   BMI 33.30 kg/m   PROVIDERS: Prince Solian, MD is PCP   Minus Breeding, MD is Cardiologist  LABS: Labs reviewed: Acceptable for surgery. (all labs ordered are listed, but only abnormal results are displayed)  Labs Reviewed  BASIC METABOLIC PANEL - Abnormal; Notable for the following components:      Result Value   Glucose, Bld 105 (*)    BUN 40 (*)    Creatinine, Ser 1.60 (*)    GFR calc non Af Amer 39 (*)    GFR calc Af Amer 45 (*)    All other components within normal limits  HEMOGLOBIN A1C - Abnormal; Notable for the following components:   Hgb A1c MFr Bld 5.8 (*)    All other components within normal limits  CBC - Abnormal; Notable for the following components:   Hemoglobin 12.9 (*)    All other components within normal limits  GLUCOSE, CAPILLARY     IMAGES: CT Renal Stone Study 02/21/2018 IMPRESSION: 1.  Nonobstructive stones in both kidneys.  No ureteral stones. 2. Left adrenal hyperplasia.  Small right adrenal adenoma. 3. Colonic diverticulosis without diverticulitis. 4. Atherosclerotic change in the nonaneurysmal aorta.  EKG: 12/01/17 Rate 56 bpm Sinus rhythm  Prolonged PR interval  Right bundle branch block   CV: 08/13/2016 VAS US CAROTID Impression:  Right: 60-79% ICA stenosis Left: 1-39% ICA stenosis  Past Medical History:  Diagnosis Date  . Bronchitis    Hx of  . CAD (coronary artery disease)    (2005 LAD 30-40% stenosis  followed by 60-65% focal stenosis.  The second diagonal had 80%   stenosis.  The circumflex had 85-90% mid stenosis and was  stented with a  Taxus stent.  The obtuse marginal had 20-25% stenosis and obtuse   marginal-2 had 40-50% stenosis.  The right coronary artery had 40-50%  proximal stenosis and mid 75% stenosis), well-preserved ejection  fraction (50-55% )  . Carotid stenosis    patient denies all other cardiac symptoms but does reprot sob but relates this to his long history of smoking   . DM (diabetes mellitus) ([place])    type 2 mgd on insulin   . History of colonic polyps   . HTN (hypertension)   . Myocardial infarction [facility])    unsure when this was   . Obesity   . Prostate cancer [facility]) 2001   Radiation   . PUD (peptic ulcer disease)   . Radiation proctitis   . Right bundle branch block   . Sigmoid diverticulosis    Hx of  . Skin cancer    hx right arm, left ear   . Sleep apnea    does not use currently   . Sleep apnea   . SOB (shortness of breath)    reports this is baseline due to his smoking   . Tobacco abuse     Past Surgical History:  Procedure Laterality Date  . CARDIAC CATHETERIZATION  12/13/2003   ptca, stent x 2  . CHOLECYSTECTOMY    . COLONOSCOPY  05/11/2000  . COLONOSCOPY W/ POLYPECTOMY    . HERNIA REPAIR     right inguinal  . Left Arm repair  1994    MEDICATIONS: . diphenhydramine-acetaminophen (TYLENOL PM) 25-500 MG TABS tablet  . amLODipine (NORVASC) 5 MG tablet  . cholestyramine light (PREVALITE) 4 G packet  . clopidogrel (PLAVIX) 75 MG tablet  . Cyanocobalamin (VITAMIN B 12 PO)  . ferrous sulfate 325 (65 FE) MG EC tablet  . gabapentin (NEURONTIN) 100 MG capsule  . ibuprofen (ADVIL,MOTRIN) 200 MG tablet  . irbesartan-hydrochlorothiazide (AVALIDE) 300-12.5 MG per tablet  . LANTUS SOLOSTAR 100 UNIT/ML Solostar Pen  . metoprolol succinate (TOPROL-XL) 25 MG 24 hr tablet  . omeprazole (PRILOSEC) 20 MG capsule  . rosuvastatin (CRESTOR) 5 MG tablet  . TRADJENTA 5 MG TABS tablet   No current facility-administered medications for this encounter.    Marland Kitchen gemcitabine (GEMZAR)  chemo syringe for bladder instillation 2,000 mg    Maia Plan [facility] Pre-Surgical Testing [phone] 05/05/18 12:03 PM

## 2018-05-05 ENCOUNTER — Telehealth: Payer: Self-pay | Admitting: Cardiology

## 2018-05-05 NOTE — Telephone Encounter (Signed)
New Message       [facility] Pre-operative Risk Assessment    Request for surgical clearance:  1. What type of surgery is being performed? Remove Bladder Tumor   2. When is this surgery scheduled? May 7th    3. What type of clearance is required (medical clearance vs. Pharmacy clearance to hold med vs. Both)? Both  4. Are there any medications that need to be held prior to surgery and how long? Aspirin for 5 days, Plavix for 1 week   5. Practice name and name of physician performing surgery? Dr Franchot Gallo from [facility]   6. What is your office phone number [phone]   7.   What is your office fax number [phone]  8.   Anesthesia type (None, local, MAC, general) ? General   Marca Ancona 05/05/2018, 3:41 PM  _________________________________________________________________   (provider comments below)

## 2018-05-05 NOTE — Telephone Encounter (Signed)
   Primary Cardiologist: Dr. Percival Spanish in 2016  Chart reviewed as part of pre-operative protocol coverage. Because of [name] [name]'s past medical history and time since last visit, he/she will require a follow-up visit in order to better assess preoperative cardiovascular risk.  We have not seen the patient since 2016.  I called the patient's daughter as she states that Dr. Dagmar Hait (PCP) had previously cleared the patient and given instructions for holding antiplatelet therapy in March when the surgery was originally scheduled. She is unsure why cardiology was contacted for clearance.   I have reached out to Dr. Alan Ripper office ([facility]) and left a message. If they want [place] to proceed with medical clearance from cardiology, the patient would need a "new patient" visit, which can be done via telehealth.  I will await further instruction for urology.   Tami Lin Duke, PA  05/05/2018, 4:31 PM

## 2018-05-06 ENCOUNTER — Telehealth: Payer: Self-pay | Admitting: Cardiology

## 2018-05-06 NOTE — Telephone Encounter (Signed)
°  Question for DOD  ASAP appointment needed for clearance for 5/7 procedure. Please advise

## 2018-05-06 NOTE — Telephone Encounter (Signed)
° °  Spoke with daughter [name] to confirm virtual TELEphone visit for 5/1

## 2018-05-06 NOTE — Progress Notes (Signed)
Virtual Visit via Video Note   This visit type was conducted due to national recommendations for restrictions regarding the COVID-19 Pandemic (e.g. social distancing) in an effort to limit this patient's exposure and mitigate transmission in our community.  Due to his co-morbid illnesses, this patient is at least at moderate risk for complications without adequate follow up.  This format is felt to be most appropriate for this patient at this time.  All issues noted in this document were discussed and addressed.  A limited physical exam was performed with this format.  Please refer to the patient's chart for his consent to telehealth for [facility].   Evaluation Performed:  New Patient Appointment  Date:  05/07/2018   ID:  Katherine Basset, DOB 1932/12/06, MRN [id number]  Patient Location: Home Provider Location: Home  PCP:  Prince Solian, MD  Cardiologist:  Minus Breeding, MD  Electrophysiologist:  None   Chief Complaint:  CAD  History of Present Illness:    [name] [name] is a 85 y.o. male with CAD.  I have not see since 2016.   He is to have cystoscopy for bladder cancer.  We are asked for cardiology clearance.   I have not seen him in several years.  He has CAD as listed below.  However, his daughter says he has been actually doing pretty well.  He denies any ongoing chest pressure, neck or arm discomfort.  He does not report any palpitations, presyncope or syncope.  He has no PND or orthopnea.  He has had bradycardia arrhythmia as listed below and has never had any problems related to this.  Unfortunately he continues to smoke and does not say he will ever give this up.  He has been smoking since he was 15.   The patient does not have symptoms concerning for COVID-19 infection (fever, chills, cough, or new shortness of breath).    Past Medical History:  Diagnosis Date  . Bronchitis    Hx of  . CAD (coronary artery disease)    (2005 LAD 30-40% stenosis  followed by 60-65% focal  stenosis.  The second diagonal had 80%   stenosis.  The circumflex had 85-90% mid stenosis and was stented with a  Taxus stent.  The obtuse marginal had 20-25% stenosis and obtuse   marginal-2 had 40-50% stenosis.  The right coronary artery had 40-50%  proximal stenosis and mid 75% stenosis), well-preserved ejection  fraction (50-55% )  . Carotid stenosis    patient denies all other cardiac symptoms but does reprot sob but relates this to his long history of smoking   . DM (diabetes mellitus) ([place])    type 2 mgd on insulin   . History of colonic polyps   . HTN (hypertension)   . Myocardial infarction [facility])    unsure when this was   . Obesity   . Prostate cancer [facility]) 2001   Radiation   . PUD (peptic ulcer disease)   . Radiation proctitis   . Right bundle branch block   . Sigmoid diverticulosis    Hx of  . Skin cancer    hx right arm, left ear   . Sleep apnea    does not use currently   . SOB (shortness of breath)    reports this is baseline due to his smoking   . Tobacco abuse    Past Surgical History:  Procedure Laterality Date  . CARDIAC CATHETERIZATION  12/13/2003   ptca, stent x 2  .  CHOLECYSTECTOMY    . COLONOSCOPY  05/11/2000  . COLONOSCOPY W/ POLYPECTOMY    . HERNIA REPAIR     right inguinal  . Left Arm repair  1994      Prior to Admission medications   Medication Sig Start Date End Date Taking? Authorizing Provider  amLODipine (NORVASC) 5 MG tablet Take 5 mg by mouth daily.  11/12/17  Yes [provider]  cholestyramine light (PREVALITE) 4 G packet Take 4 g by mouth daily as needed.    Yes [provider]  clopidogrel (PLAVIX) 75 MG tablet Take 1 tablet (75 mg total) by mouth daily. 01/31/14  Yes Minus Breeding, MD  Cyanocobalamin (VITAMIN B 12 PO) Take 1 tablet by mouth daily.   Yes [provider]  diphenhydramine-acetaminophen (TYLENOL PM) 25-500 MG TABS tablet Take 1 tablet by mouth at bedtime as needed.   Yes [provider]   ferrous sulfate 325 (65 FE) MG EC tablet Take 325 mg by mouth daily with breakfast.    Yes [provider]  gabapentin (NEURONTIN) 100 MG capsule Take 1 capsule by mouth at bedtime as needed (leg pain).  11/12/17  Yes [provider]  ibuprofen (ADVIL,MOTRIN) 200 MG tablet Take 400 mg by mouth every 6 (six) hours as needed (pain).    Yes [provider]  irbesartan-hydrochlorothiazide (AVALIDE) 300-12.5 MG per tablet Take 1 tablet by mouth daily. 01/31/14  Yes Minus Breeding, MD  LANTUS SOLOSTAR 100 UNIT/ML Solostar Pen Inject 34-36 Units into the skin at bedtime. 12/01/17  Yes [provider]  metoprolol succinate (TOPROL-XL) 25 MG 24 hr tablet TAKE 1 TABLET BY MOUTH TWICE DAILY Patient taking differently: Take 25 mg by mouth daily. TAKE 1 TABLET BY MOUTH TWICE DAILY 01/31/14  Yes Minus Breeding, MD  omeprazole (PRILOSEC) 20 MG capsule Take 20 mg by mouth daily.     Yes [provider]  rosuvastatin (CRESTOR) 5 MG tablet Take 5 mg by mouth daily.  11/12/17  Yes [provider]  TRADJENTA 5 MG TABS tablet Take 5 mg by mouth daily.  11/24/17  Yes [provider]    Allergies:   Patient has no known allergies.   Social History   Tobacco Use  . Smoking status: Current Every Day Smoker    Packs/day: 1.00    Years: 60.00    Pack years: 60.00    Types: Cigarettes  . Smokeless tobacco: Never Used  Substance Use Topics  . Alcohol use: No  . Drug use: No     Family Hx: The patient's family history includes Cancer in his father; Coronary artery disease (age of onset: 20) in his father; Coronary artery disease (age of onset: 58) in his brother; Coronary artery disease (age of onset: 62) in his sister; Stroke (age of onset: 53) in his mother.  ROS:   Please see the history of present illness.    As stated in the HPI and negative for all other systems.   Prior CV studies:   The following studies were reviewed today:  Carotid, EKG    Labs/Other Tests and Data Reviewed:    EKG:  NSR, rate 55, RBBB, pronounced first degree AV block. 03/19/18  Recent Labs: 02/21/2018: ALT 13 05/03/2018: BUN 40; Creatinine, Ser 1.60; Hemoglobin 12.9; Platelets 162; Potassium 4.7; Sodium 139   Recent Lipid Panel No results found for: CHOL, TRIG, HDL, CHOLHDL, LDLCALC, LDLDIRECT  Wt Readings from Last 3 Encounters:  05/07/18 210 lb (95.3 kg)  05/03/18  194 lb (88 kg)  02/21/18 212 lb (96.2 kg)     Objective:    Vital Signs:  Ht 5\' 4"  (1.626 m)   Wt 210 lb (95.3 kg)   BMI 36.05 kg/m    VITAL SIGNS:  reviewed GEN:  no acute distress RESPIRATORY:  normal respiratory effort, symmetric expansion NEURO:  alert and oriented x 3, no obvious focal deficit PSYCH:  normal affect  ASSESSMENT & PLAN:    PREOP: I had a long discussion with the patient and his daughter about his perioperative risk.  This is not a high risk procedure.  According to [facility] criteria he be a low risk for this type of procedure.  According to modified Truman Hayward criteria he is at high risk because of his vascular disease and diabetes.  We will treat this in between and he is at moderate risk but it is completely acceptable for the benefit offered.  I think he is at low risk for MI.  However, with his bradycardia arrhythmia this is always a possibility and should be followed and monitored during this short procedure in the recovery area.  No further testing or change in therapy is indicated.  He should proceed with the procedure.  He can hold Plavix for this procedure butt would need to continue ASA unless the risk of bleeding is prohibitive.    CAD -  He has no ongoing chest pain.  No change in therapy is indicated.    TOBACCO ABUSE -  He doesn't ever plan to quit. W  DYSLIPIDEMIA -  He has been intolerant of higher dose statins.  I will defer management to Dr.  Dagmar Hait  CAROTID BRUIT Carotid Dopplers in 2018 demonstrated 60-79% in 2018 on the right.  I will schedule  follow up.   HYPERTENSION -  His blood pressure is at target.  Continue current therapy.   COVID-19 Education: The signs and symptoms of COVID-19 were discussed with the patient and how to seek care for testing (follow up with PCP or arrange E-visit).  The importance of social distancing was discussed today.  Time:   Today, I have spent 25 minutes with the patient with telehealth technology discussing the above problems.     Medication Adjustments/Labs and Tests Ordered: Current medicines are reviewed at length with the patient today.  Concerns regarding medicines are outlined above.   Tests Ordered: No orders of the defined types were placed in this encounter.   Medication Changes: No orders of the defined types were placed in this encounter.   Disposition:  Follow up me as needed.    Signed, Minus Breeding, MD  05/07/2018 12:16 PM    [place]

## 2018-05-07 ENCOUNTER — Telehealth (INDEPENDENT_AMBULATORY_CARE_PROVIDER_SITE_OTHER): Payer: Medicare Other | Admitting: Cardiology

## 2018-05-07 ENCOUNTER — Telehealth: Payer: Self-pay | Admitting: Cardiology

## 2018-05-07 ENCOUNTER — Encounter: Payer: Self-pay | Admitting: Cardiology

## 2018-05-07 VITALS — Ht 64.0 in | Wt 210.0 lb

## 2018-05-07 DIAGNOSIS — Z7189 Other specified counseling: Secondary | ICD-10-CM | POA: Diagnosis not present

## 2018-05-07 DIAGNOSIS — I2510 Atherosclerotic heart disease of native coronary artery without angina pectoris: Secondary | ICD-10-CM

## 2018-05-07 DIAGNOSIS — Z01810 Encounter for preprocedural cardiovascular examination: Secondary | ICD-10-CM

## 2018-05-07 DIAGNOSIS — R0989 Other specified symptoms and signs involving the circulatory and respiratory systems: Secondary | ICD-10-CM | POA: Diagnosis not present

## 2018-05-07 DIAGNOSIS — I6523 Occlusion and stenosis of bilateral carotid arteries: Secondary | ICD-10-CM | POA: Diagnosis not present

## 2018-05-07 NOTE — Telephone Encounter (Signed)
   Primary Cardiologist: Minus Breeding, MD  Chart reviewed as part of pre-operative protocol coverage. Patient was contacted 05/07/2018 in reference to pre-operative risk assessment for pending surgery as outlined below.  [name] [name] was last seen on 05/07/18 by Dr. Percival Spanish.    Per Dr. Percival Spanish:  I had a long discussion with the patient and his daughter about his perioperative risk.  This is not a high risk procedure.  According to [facility] criteria he be a low risk for this type of procedure.  According to modified Truman Hayward criteria he is at high risk because of his vascular disease and diabetes.  We will treat this in between and he is at moderate risk but it is completely acceptable for the benefit offered.  I think he is at low risk for MI.  However, with his bradycardia arrhythmia this is always a possibility and should be followed and monitored during this short procedure in the recovery area.  No further testing or change in therapy is indicated.  He should proceed with the procedure.  He can hold Plavix for this procedure butt would need to continue ASA unless the risk of bleeding is prohibitive.     Therefore, based on ACC/AHA guidelines, the patient would be at acceptable risk for the planned procedure without further cardiovascular testing.   I will route this recommendation to the requesting party via Epic fax function and remove from pre-op pool.  Please call with questions.  [place], PA 05/07/2018, 4:28 PM

## 2018-05-07 NOTE — Telephone Encounter (Signed)
June is fine

## 2018-05-07 NOTE — Telephone Encounter (Signed)
Follow up   Teton from [facility] is following up on Clearance because the patient surgery is on 05/13/18 and would like to know about the  clearance before 4:00 pm today if possible. Please call.

## 2018-05-07 NOTE — Anesthesia Preprocedure Evaluation (Addendum)
Anesthesia Evaluation  Patient identified by MRN, date of birth, ID band Patient awake    Reviewed: Allergy & Precautions, NPO status , Patient's Chart, lab work & pertinent test results  Airway Mallampati: II  TM Distance: >3 FB Neck ROM: Full    Dental no notable dental hx.    Pulmonary sleep apnea , COPD, Current Smoker,    Pulmonary exam normal breath sounds clear to auscultation + decreased breath sounds      Cardiovascular hypertension, + CAD and + Past MI  Normal cardiovascular exam Rhythm:Regular Rate:Normal     Neuro/Psych negative neurological ROS  negative psych ROS   GI/Hepatic negative GI ROS, Neg liver ROS,   Endo/Other  diabetes, Type 2  Renal/GU negative Renal ROS  negative genitourinary   Musculoskeletal negative musculoskeletal ROS (+)   Abdominal   Peds negative pediatric ROS (+)  Hematology negative hematology ROS (+)   Anesthesia Other Findings   Reproductive/Obstetrics negative OB ROS                            Anesthesia Physical Anesthesia Plan  ASA: III  Anesthesia Plan: General   Post-op Pain Management:    Induction: Intravenous  PONV Risk Score and Plan: 2 and Ondansetron and Treatment may vary due to age or medical condition  Airway Management Planned: LMA and Oral ETT  Additional Equipment:   Intra-op Plan:   Post-operative Plan: Extubation in OR  Informed Consent: I have reviewed the patients History and Physical, chart, labs and discussed the procedure including the risks, benefits and alternatives for the proposed anesthesia with the patient or authorized representative who has indicated his/her understanding and acceptance.     Dental advisory given  Plan Discussed with: CRNA  Anesthesia Plan Comments: (See PAT note 05/03/18, Konrad Felix, PA-C)       Anesthesia Quick Evaluation

## 2018-05-07 NOTE — Telephone Encounter (Signed)
New Message:     Crystal calling concering appt for a patient. Office close at 4 the appt can only be end of June if the doctor need it done sooner please contacts [place].

## 2018-05-07 NOTE — Telephone Encounter (Signed)
Call and leave message on the voicemail of crystal to have ultrasound done in 6 Months

## 2018-05-07 NOTE — Telephone Encounter (Signed)
Called and notified as well, to call back if questions.  Thanks!

## 2018-05-07 NOTE — Telephone Encounter (Signed)
Ordered for [place] to be done- ordered as routine, they are not completing those routine ones until the later part in June, or if Dr.Hochrein wanted earlier. I advised I would route to assistant and MD.

## 2018-05-07 NOTE — Patient Instructions (Signed)
Medication Instructions:  Continue current medications  If you need a refill on your cardiac medications before your next appointment, please call your pharmacy.  Labwork: None Ordered   Testing/Procedures: Your physician has requested that you have a carotid duplex at [facility] in 6 Months. This test is an ultrasound of the carotid arteries in your neck. It looks at blood flow through these arteries that supply the brain with blood. Allow one hour for this exam. There are no restrictions or special instructions.   Follow-Up: . Your physician recommends that you schedule a follow-up appointment in: As Needed   At [facility], you and your health needs are our priority.  As part of our continuing mission to provide you with exceptional heart care, we have created designated Provider Care Teams.  These Care Teams include your primary Cardiologist (physician) and Advanced Practice Providers (APPs -  Physician Assistants and Nurse Practitioners) who all work together to provide you with the care you need, when you need it.  Thank you for choosing [facility] at [facility]!!

## 2018-05-07 NOTE — Telephone Encounter (Signed)
That ultrasound is to be done in the fall

## 2018-05-10 NOTE — Progress Notes (Signed)
05/07/2018- Cardiac Clearance from [place], PA for Dr. Percival Spanish on chart and in [place].

## 2018-05-11 NOTE — Progress Notes (Addendum)
Cardiac clearance note angela duke pa 05-05-18 epic

## 2018-05-13 ENCOUNTER — Encounter (HOSPITAL_COMMUNITY)
Admission: RE | Disposition: A | Discharge status: Home / Self Care | Payer: Self-pay | Source: Home / Self Care | Attending: Urology

## 2018-05-13 ENCOUNTER — Ambulatory Visit (HOSPITAL_COMMUNITY): Payer: Medicare Other | Admitting: Physician Assistant

## 2018-05-13 ENCOUNTER — Encounter (HOSPITAL_COMMUNITY): Payer: Self-pay

## 2018-05-13 ENCOUNTER — Ambulatory Visit (HOSPITAL_COMMUNITY)
Admission: RE | Admit: 2018-05-13 | Discharge: 2018-05-13 | Disposition: A | Discharge status: Home / Self Care | Payer: Medicare Other | Attending: Urology | Admitting: Urology

## 2018-05-13 ENCOUNTER — Ambulatory Visit (HOSPITAL_COMMUNITY): Payer: Medicare Other | Admitting: Anesthesiology

## 2018-05-13 ENCOUNTER — Ambulatory Visit (HOSPITAL_COMMUNITY): Payer: Medicare Other

## 2018-05-13 DIAGNOSIS — G4730 Sleep apnea, unspecified: Secondary | ICD-10-CM | POA: Diagnosis not present

## 2018-05-13 DIAGNOSIS — D494 Neoplasm of unspecified behavior of bladder: Secondary | ICD-10-CM | POA: Diagnosis not present

## 2018-05-13 DIAGNOSIS — E669 Obesity, unspecified: Secondary | ICD-10-CM | POA: Insufficient documentation

## 2018-05-13 DIAGNOSIS — C672 Malignant neoplasm of lateral wall of bladder: Secondary | ICD-10-CM

## 2018-05-13 DIAGNOSIS — Z794 Long term (current) use of insulin: Secondary | ICD-10-CM | POA: Insufficient documentation

## 2018-05-13 DIAGNOSIS — F17200 Nicotine dependence, unspecified, uncomplicated: Secondary | ICD-10-CM | POA: Insufficient documentation

## 2018-05-13 DIAGNOSIS — I252 Old myocardial infarction: Secondary | ICD-10-CM | POA: Insufficient documentation

## 2018-05-13 DIAGNOSIS — E119 Type 2 diabetes mellitus without complications: Secondary | ICD-10-CM | POA: Insufficient documentation

## 2018-05-13 DIAGNOSIS — I10 Essential (primary) hypertension: Secondary | ICD-10-CM | POA: Diagnosis not present

## 2018-05-13 DIAGNOSIS — C679 Malignant neoplasm of bladder, unspecified: Secondary | ICD-10-CM | POA: Diagnosis not present

## 2018-05-13 DIAGNOSIS — J449 Chronic obstructive pulmonary disease, unspecified: Secondary | ICD-10-CM | POA: Insufficient documentation

## 2018-05-13 DIAGNOSIS — Z6836 Body mass index (BMI) 36.0-36.9, adult: Secondary | ICD-10-CM | POA: Diagnosis not present

## 2018-05-13 DIAGNOSIS — I2510 Atherosclerotic heart disease of native coronary artery without angina pectoris: Secondary | ICD-10-CM | POA: Insufficient documentation

## 2018-05-13 DIAGNOSIS — J40 Bronchitis, not specified as acute or chronic: Secondary | ICD-10-CM | POA: Diagnosis not present

## 2018-05-13 HISTORY — PX: TRANSURETHRAL RESECTION OF BLADDER TUMOR WITH MITOMYCIN-C: SHX6459

## 2018-05-13 HISTORY — PX: CYSTOSCOPY W/ RETROGRADES: SHX1426

## 2018-05-13 LAB — PROTIME-INR
INR: 1 (ref 0.8–1.2)
Prothrombin Time: 13.1 seconds (ref 11.4–15.2)

## 2018-05-13 LAB — GLUCOSE, CAPILLARY
Glucose-Capillary: 87 mg/dL (ref 70–99)
Glucose-Capillary: 91 mg/dL (ref 70–99)

## 2018-05-13 SURGERY — TRANSURETHRAL RESECTION OF BLADDER TUMOR WITH MITOMYCIN-C
Anesthesia: General

## 2018-05-13 MED ORDER — FENTANYL CITRATE (PF) 100 MCG/2ML IJ SOLN
25.0000 ug | INTRAMUSCULAR | Status: DC | PRN
  Administered 2018-05-13 (×3): 50 ug via INTRAVENOUS

## 2018-05-13 MED ORDER — FENTANYL CITRATE (PF) 100 MCG/2ML IJ SOLN
INTRAMUSCULAR | Status: AC
  Filled 2018-05-13: qty 2

## 2018-05-13 MED ORDER — FENTANYL CITRATE (PF) 100 MCG/2ML IJ SOLN
INTRAMUSCULAR | Status: DC | PRN
  Administered 2018-05-13: 50 ug via INTRAVENOUS

## 2018-05-13 MED ORDER — CEPHALEXIN 500 MG PO CAPS: 500.0000 mg | ORAL_CAPSULE | Freq: Two times a day (BID) | ORAL | 0 refills | Status: DC

## 2018-05-13 MED ORDER — MEPERIDINE HCL 50 MG/ML IJ SOLN: 6.2500 mg | INTRAMUSCULAR | Status: DC | PRN

## 2018-05-13 MED ORDER — LACTATED RINGERS IV SOLN
INTRAVENOUS | Status: DC
  Administered 2018-05-13: 10:00:00 via INTRAVENOUS

## 2018-05-13 MED ORDER — FENTANYL CITRATE (PF) 100 MCG/2ML IJ SOLN
INTRAMUSCULAR | Status: AC
  Filled 2018-05-13: qty 4

## 2018-05-13 MED ORDER — PROPOFOL 10 MG/ML IV BOLUS
INTRAVENOUS | Status: AC
  Filled 2018-05-13: qty 20

## 2018-05-13 MED ORDER — PROPOFOL 10 MG/ML IV BOLUS
INTRAVENOUS | Status: DC | PRN
  Administered 2018-05-13: 120 mg via INTRAVENOUS

## 2018-05-13 MED ORDER — ONDANSETRON HCL 4 MG/2ML IJ SOLN
INTRAMUSCULAR | Status: DC | PRN
  Administered 2018-05-13: 4 mg via INTRAVENOUS

## 2018-05-13 MED ORDER — ONDANSETRON 4 MG PO TBDP
4.0000 mg | ORAL_TABLET | Freq: Once | ORAL | Status: AC
  Administered 2018-05-13: 15:00:00 4 mg via ORAL

## 2018-05-13 MED ORDER — EPHEDRINE SULFATE 50 MG/ML IJ SOLN
INTRAMUSCULAR | Status: DC | PRN
  Administered 2018-05-13: 10 mg via INTRAVENOUS

## 2018-05-13 MED ORDER — CEFAZOLIN SODIUM-DEXTROSE 2-4 GM/100ML-% IV SOLN
2.0000 g | INTRAVENOUS | Status: AC
  Administered 2018-05-13: 2 g via INTRAVENOUS
  Filled 2018-05-13: qty 100

## 2018-05-13 MED ORDER — METOCLOPRAMIDE HCL 5 MG/ML IJ SOLN: 10.0000 mg | Freq: Once | INTRAMUSCULAR | Status: DC | PRN

## 2018-05-13 MED ORDER — IOHEXOL 300 MG/ML  SOLN
INTRAMUSCULAR | Status: DC | PRN
  Administered 2018-05-13: 8 mL via URETHRAL

## 2018-05-13 MED ORDER — ONDANSETRON HCL 4 MG/2ML IJ SOLN
INTRAMUSCULAR | Status: AC
  Filled 2018-05-13: qty 2

## 2018-05-13 MED ORDER — LIDOCAINE HCL (CARDIAC) PF 100 MG/5ML IV SOSY
PREFILLED_SYRINGE | INTRAVENOUS | Status: DC | PRN
  Administered 2018-05-13: 60 mg via INTRAVENOUS

## 2018-05-13 MED ORDER — ONDANSETRON 4 MG PO TBDP
ORAL_TABLET | ORAL | Status: AC
  Filled 2018-05-13: qty 1

## 2018-05-13 MED ORDER — GEMCITABINE CHEMO FOR BLADDER INSTILLATION 2000 MG
2000.0000 mg | Freq: Once | INTRAVENOUS | Status: AC
  Administered 2018-05-13: 12:00:00 2000 mg via INTRAVESICAL
  Filled 2018-05-13: qty 2000

## 2018-05-13 MED ORDER — SODIUM CHLORIDE 0.9 % IR SOLN
Status: DC | PRN
  Administered 2018-05-13: 6000 mL via INTRAVESICAL

## 2018-05-13 SURGICAL SUPPLY — 28 items
BAG URO CATCHER STRL LF (MISCELLANEOUS) ×4 IMPLANT
BASKET LASER NITINOL 1.9FR (BASKET) IMPLANT
BASKET ZERO TIP NITINOL 2.4FR (BASKET) IMPLANT
CATH FOLEY 2WAY SLVR  5CC 20FR (CATHETERS) ×2
CATH FOLEY 2WAY SLVR 5CC 20FR (CATHETERS) ×2 IMPLANT
CATH INTERMIT  6FR 70CM (CATHETERS) IMPLANT
CATH URET 5FR 28IN OPEN ENDED (CATHETERS) ×4 IMPLANT
CLOTH BEACON ORANGE TIMEOUT ST (SAFETY) ×4 IMPLANT
CONT SPEC 4OZ CLIKSEAL STRL BL (MISCELLANEOUS) ×4 IMPLANT
COVER WAND RF STERILE (DRAPES) IMPLANT
FIBER LASER FLEXIVA 365 (UROLOGICAL SUPPLIES) IMPLANT
FIBER LASER TRAC TIP (UROLOGICAL SUPPLIES) IMPLANT
GLOVE BIOGEL M 8.0 STRL (GLOVE) ×4 IMPLANT
GOWN STRL REUS W/ TWL XL LVL3 (GOWN DISPOSABLE) ×2 IMPLANT
GOWN STRL REUS W/TWL LRG LVL3 (GOWN DISPOSABLE) ×8 IMPLANT
GOWN STRL REUS W/TWL XL LVL3 (GOWN DISPOSABLE) ×2
GUIDEWIRE ANG ZIPWIRE 038X150 (WIRE) IMPLANT
GUIDEWIRE STR DUAL SENSOR (WIRE) ×4 IMPLANT
IV NS 1000ML (IV SOLUTION) ×2
IV NS 1000ML BAXH (IV SOLUTION) ×2 IMPLANT
KIT TURNOVER KIT A (KITS) IMPLANT
MANIFOLD NEPTUNE II (INSTRUMENTS) ×4 IMPLANT
PACK CYSTO (CUSTOM PROCEDURE TRAY) ×4 IMPLANT
PLUG CATH AND CAP STER (CATHETERS) ×4 IMPLANT
TUBING CONNECTING 10 (TUBING) ×3 IMPLANT
TUBING CONNECTING 10' (TUBING) ×1
TUBING UROLOGY SET (TUBING) ×4 IMPLANT
WATER STERILE IRR 1000ML POUR (IV SOLUTION) ×4 IMPLANT

## 2018-05-13 NOTE — Progress Notes (Signed)
Spoke with pt's daughter,Donnell Walker, via phone.  She is pt's ride home today.  Gave her an estimate on d/c time and informed her the recovery nurse would call when pt was out of surgery with a more specific time for d/c.  Ms. Gilford Rile voiced understanding.

## 2018-05-13 NOTE — Transfer of Care (Signed)
Immediate Anesthesia Transfer of Care Note  Patient: [name] [name]  Procedure(s) Performed: TRANSURETHRAL RESECTION OF BLADDER TUMOR WITH MITOMYCIN-C (N/A ) CYSTOSCOPY WITH RETROGRADE PYELOGRAM (Bilateral )  Patient Location: PACU  Anesthesia Type:General  Level of Consciousness: awake, alert  and oriented  Airway & Oxygen Therapy: Patient Spontanous Breathing and Patient connected to face mask oxygen  Post-op Assessment: Report given to RN and Post -op Vital signs reviewed and stable  Post vital signs: Reviewed and stable  Last Vitals:  Vitals Value Taken Time  BP    Temp    Pulse 63 05/13/2018 12:15 PM  Resp 16 05/13/2018 12:15 PM  SpO2 100 % 05/13/2018 12:15 PM  Vitals shown include unvalidated device data.  Last Pain:  Vitals:   05/13/18 0941  TempSrc:   PainSc: 0-No pain         Complications: No apparent anesthesia complications

## 2018-05-13 NOTE — Anesthesia Procedure Notes (Signed)
Procedure Name: LMA Insertion Date/Time: 05/13/2018 11:37 AM Performed by: Glory Buff, CRNA Pre-anesthesia Checklist: Patient identified, Emergency Drugs available, Suction available and Patient being monitored Patient Re-evaluated:Patient Re-evaluated prior to induction Oxygen Delivery Method: Circle system utilized Preoxygenation: Pre-oxygenation with 100% oxygen Induction Type: IV induction LMA: LMA with gastric port inserted LMA Size: 4.0 Number of attempts: 1 Placement Confirmation: positive ETCO2 Tube secured with: Tape Dental Injury: Teeth and Oropharynx as per pre-operative assessment

## 2018-05-13 NOTE — Anesthesia Postprocedure Evaluation (Signed)
Anesthesia Post Note  Patient: [name] [name]  Procedure(s) Performed: TRANSURETHRAL RESECTION OF BLADDER TUMOR WITH MITOMYCIN-C (N/A ) CYSTOSCOPY WITH RETROGRADE PYELOGRAM (Bilateral )     Patient location during evaluation: PACU Anesthesia Type: General Level of consciousness: awake and alert Pain management: pain level controlled Vital Signs Assessment: post-procedure vital signs reviewed and stable Respiratory status: spontaneous breathing, nonlabored ventilation, respiratory function stable and patient connected to nasal cannula oxygen Cardiovascular status: blood pressure returned to baseline and stable Postop Assessment: no apparent nausea or vomiting Anesthetic complications: no    Last Vitals:  Vitals:   05/13/18 1245 05/13/18 1300  BP: 124/65 (!) 149/65  Pulse: (!) 59 (!) 56  Resp: (!) 21 13  Temp:  36.8 C  SpO2: 100% 95%    Last Pain:  Vitals:   05/13/18 1245  TempSrc:   PainSc: 3                  Montez Hageman

## 2018-05-13 NOTE — H&P (Signed)
H&P  Chief Complaint: Bladder tumor  History of Present Illness:  85 year old male presents for TURBT.  In late February of this year he presented with gross hematuria and was found to have a 15 mm left lateral wall bladder tumor.  That was felt to be urothelial carcinoma.  Presents at this time for TURBT and placement of gemcitabine.  Past Medical History:  Diagnosis Date  . Bronchitis    Hx of  . CAD (coronary artery disease)    (2005 LAD 30-40% stenosis  followed by 60-65% focal stenosis.  The second diagonal had 80%   stenosis.  The circumflex had 85-90% mid stenosis and was stented with a  Taxus stent.  The obtuse marginal had 20-25% stenosis and obtuse   marginal-2 had 40-50% stenosis.  The right coronary artery had 40-50%  proximal stenosis and mid 75% stenosis), well-preserved ejection  fraction (50-55% )  . Carotid stenosis    patient denies all other cardiac symptoms but does reprot sob but relates this to his long history of smoking   . DM (diabetes mellitus) ([place])    type 2 mgd on insulin   . History of colonic polyps   . HTN (hypertension)   . Myocardial infarction [facility])    unsure when this was   . Obesity   . Prostate cancer [facility]) 2001   Radiation   . PUD (peptic ulcer disease)   . Radiation proctitis   . Right bundle branch block   . Sigmoid diverticulosis    Hx of  . Skin cancer    hx right arm, left ear   . Sleep apnea    does not use currently   . SOB (shortness of breath)    reports this is baseline due to his smoking   . Tobacco abuse     Past Surgical History:  Procedure Laterality Date  . CARDIAC CATHETERIZATION  12/13/2003   ptca, stent x 2  . CHOLECYSTECTOMY    . COLONOSCOPY  05/11/2000  . COLONOSCOPY W/ POLYPECTOMY    . HERNIA REPAIR     right inguinal  . Left Arm repair  1994    Home Medications:   Allergies: No Known Allergies  Family History  Problem Relation Age of Onset  . Cancer Father   . Coronary artery disease Father 37  .  Coronary artery disease Brother 64  . Coronary artery disease Sister 66  . Stroke Mother 77    Social History:  reports that he has been smoking cigarettes. He has a 60.00 pack-year smoking history. He has never used smokeless tobacco. He reports that he does not drink alcohol or use drugs.  ROS: A complete review of systems was performed.  All systems are negative except for pertinent findings as noted.  Physical Exam:  Vital signs in last 24 hours: Temp:  [97.6 F (36.4 C)] 97.6 F (36.4 C) (05/07 0925) Pulse Rate:  [54] 54 (05/07 0925) Resp:  [18] 18 (05/07 0925) BP: (161)/(68) 161/68 (05/07 0925) SpO2:  [97 %] 97 % (05/07 0925) Weight:  [95.3 kg] 95.3 kg (05/07 0900) Constitutional:  Alert and oriented, No acute distress Cardiovascular: Regular rate  Respiratory: Normal respiratory effort GI: Abdomen is soft, nontender, nondistended, no abdominal masses. No CVAT.  Genitourinary: Normal male phallus, testes are descended bilaterally and non-tender and without masses, scrotum is normal in appearance without lesions or masses, perineum is normal on inspection. Lymphatic: No lymphadenopathy Neurologic: Grossly intact, no focal deficits Psychiatric: Normal mood and  affect  Laboratory Data:  No results for input(s): WBC, HGB, HCT, PLT in the last 72 hours.  No results for input(s): NA, K, CL, GLUCOSE, BUN, CALCIUM, CREATININE in the last 72 hours.  Invalid input(s): CO3   Results for orders placed or performed during the hospital encounter of 05/13/18 (from the past 24 hour(s))  Glucose, capillary     Status: None   Collection Time: 05/13/18  9:24 AM  Result Value Ref Range   Glucose-Capillary 87 70 - 99 mg/dL   Comment 1 Notify RN    Comment 2 Document in Chart    No results found for this or any previous visit (from the past 240 hour(s)).  Renal Function: No results for input(s): CREATININE in the last 168 hours. Estimated Creatinine Clearance: 36.4 mL/min (A) (by C-G  formula based on SCr of 1.6 mg/dL (H)).  Radiologic Imaging: No results found.  Impression/Assessment:    15 mm left lateral wall bladder tumor  Plan:   cystoscopy, TURBT, placement of intravesical gemcitabine

## 2018-05-13 NOTE — Op Note (Signed)
Preoperative diagnosis left lateral bladder wall tumor, 15 mm  Postoperative diagnosis: Same  Principal procedure: Cystoscopy, bilateral retrograde ureteropyelogram, fluoroscopic interpretation, transurethral resection of bladder tumor, placement of 2 g of gemcitabine intravesically (done in PACU)  Surgeon: Dazaria Macneill  Anesthesia: General  Complications: None  Specimen: 1.  Bladder tumor (due to handling after me passing it from the patient, a very small portion of the specimen was actually sent 2.  Bladder tumor base  Drains: 60 French Foley catheter  Estimated blood loss: None  Indications: 85 year old male with recently discovered bladder tumor.  He had intermittent gross hematuria prior to his presentation in the office.  He has not had hematuria since that time, being off of aspirin.  He presents at this time for transurethral resection of his bladder tumor as well as placement of intravesical gemcitabine.  The procedure as well as risks and complications have been discussed with the patient.  He understands these and desires to proceed.  Findings: Urethra was normal.  Prostate was nonobstructive.  Bladder revealed normal urothelium with the exception of a 15 mm papillary lesion just lateral and superior to the left ureteral orifice.  No other urothelial lesions were noted, no significant trabeculations were present.  Ureteral orifice ease were normal in configuration location.  Bilateral retrograde ureteral pyelograms, performed using a 6 French open-ended catheter and Omnipaque revealed normal ureters and pyelo-calyceal systems bilaterally.  Description of procedure: The patient was properly identified in the holding area.  He received preoperative IV antibiotics.  Was taken to the operating room where general anesthetic was administered.  He was placed in the dorsolithotomy position.  Genitalia and perineum were prepped and draped.  Proper timeout was performed.  A 21 French  panendoscope was utilized for cystoscopy with the above-mentioned findings.  Retrograde ureteral pyelograms were then performed, again with the above-mentioned findings.  Following this, the cystoscope was removed and a 26 French resectoscope sheath was placed using the obturator.  The resectoscope and cutting loop were then placed.  The bladder tumor was resected down to the muscular layer.  Specimen was then passed off of the table.  Unfortunately, after me passing this from the table, only a very small portion of the bladder tumor fragments were recovered by the staff and placed in the pathology vial.  I then resected underneath of that into the muscular layer and this specimen was separately sent labeled "bladder tumor base".  The base was then cauterized with the coag current.  Care was taken to avoid injury to the ureteral orifice.  At this point, following recovery of the second specimen, the scope was removed.  87 French Foley catheter was placed.  The bladder was drained and the catheter plug placed.  The patient was then awakened and taken to the PACU in stable condition.  He tolerated procedure well.  In the PACU, 2 g of gemcitabine and diluent were then placed and left indwelling for an hour.  It was then drained.  Leg bag was not attached.

## 2018-05-13 NOTE — Discharge Instructions (Signed)
1. You may see some blood in the urine and may have some burning with urination for 48-72 hours. You also may notice that you have to urinate more frequently or urgently after your procedure which is normal.  2. You should call should you develop an inability urinate, fever > 101, persistent nausea and vomiting that prevents you from eating or drinking to stay hydrated.  3. If you have a catheter, you will be taught how to take care of the catheter by the nursing staff prior to discharge from the hospital.  You may periodically feel a strong urge to void with the catheter in place.  This is a bladder spasm and most often can occur when having a bowel movement or moving around. It is typically self-limited and usually will stop after a few minutes.  You may use some Vaseline or Neosporin around the tip of the catheter to reduce friction at the tip of the penis. You may also see some blood in the urine.  A very small amount of blood can make the urine look quite red.  As long as the catheter is draining well, there usually is not a problem.  However, if the catheter is not draining well and is bloody, you should call the office [phone]) to notify us.  It is okay to remove the catheter on Friday morning if the urine is clear.

## 2018-05-13 NOTE — Progress Notes (Signed)
Patient asking to use the bathroom to have a bowel movement.  Assisted patient to the bathroom and patient had medium green loose stool.  Patient mentioned that he occasionally has loose stools at home.  Patient's skin was cleaned and sent home with paper scrubs and mesh underwear.  Patient complains of vomiting as well.  Patient given Zofran 4 mg SL.  When departing from the bathroom with the patient - Dr Diona Fanti came to see the patient.  Dr Diona Fanti told about the vomiting and one loose stool.  Attempted to start an IV in Right arm to give Phenergan but patient refused.  Patient given another Zofran 4 mg SL as requested by Dr Diona Fanti.  Patient better after the zofran with less nausea and no stool.  Explained all of this to the daughter and told her if the patient started to have nausea later to call Dr Dahledt's office.  Dr Dorina Hoyer aware.  Patient placed in the car with his daughter and no nausea/vomiting no diarrhea.

## 2018-05-14 ENCOUNTER — Encounter (HOSPITAL_COMMUNITY): Payer: Self-pay | Admitting: Urology

## 2018-05-25 DIAGNOSIS — M79676 Pain in unspecified toe(s): Secondary | ICD-10-CM | POA: Diagnosis not present

## 2018-05-25 DIAGNOSIS — L84 Corns and callosities: Secondary | ICD-10-CM | POA: Diagnosis not present

## 2018-05-25 DIAGNOSIS — E1142 Type 2 diabetes mellitus with diabetic polyneuropathy: Secondary | ICD-10-CM | POA: Diagnosis not present

## 2018-05-25 DIAGNOSIS — B351 Tinea unguium: Secondary | ICD-10-CM | POA: Diagnosis not present

## 2018-07-14 DIAGNOSIS — C672 Malignant neoplasm of lateral wall of bladder: Secondary | ICD-10-CM | POA: Diagnosis not present

## 2018-07-14 DIAGNOSIS — C61 Malignant neoplasm of prostate: Secondary | ICD-10-CM | POA: Diagnosis not present

## 2018-07-14 DIAGNOSIS — R310 Gross hematuria: Secondary | ICD-10-CM | POA: Diagnosis not present

## 2018-07-23 DIAGNOSIS — I2510 Atherosclerotic heart disease of native coronary artery without angina pectoris: Secondary | ICD-10-CM | POA: Diagnosis not present

## 2018-07-23 DIAGNOSIS — I6523 Occlusion and stenosis of bilateral carotid arteries: Secondary | ICD-10-CM | POA: Diagnosis not present

## 2018-07-23 DIAGNOSIS — D494 Neoplasm of unspecified behavior of bladder: Secondary | ICD-10-CM | POA: Diagnosis not present

## 2018-07-23 DIAGNOSIS — E1151 Type 2 diabetes mellitus with diabetic peripheral angiopathy without gangrene: Secondary | ICD-10-CM | POA: Diagnosis not present

## 2018-07-23 DIAGNOSIS — N183 Chronic kidney disease, stage 3 (moderate): Secondary | ICD-10-CM | POA: Diagnosis not present

## 2018-07-23 DIAGNOSIS — I129 Hypertensive chronic kidney disease with stage 1 through stage 4 chronic kidney disease, or unspecified chronic kidney disease: Secondary | ICD-10-CM | POA: Diagnosis not present

## 2018-07-23 DIAGNOSIS — E669 Obesity, unspecified: Secondary | ICD-10-CM | POA: Diagnosis not present

## 2018-07-26 DIAGNOSIS — E1151 Type 2 diabetes mellitus with diabetic peripheral angiopathy without gangrene: Secondary | ICD-10-CM | POA: Diagnosis not present

## 2018-07-26 DIAGNOSIS — I129 Hypertensive chronic kidney disease with stage 1 through stage 4 chronic kidney disease, or unspecified chronic kidney disease: Secondary | ICD-10-CM | POA: Diagnosis not present

## 2018-08-03 DIAGNOSIS — B351 Tinea unguium: Secondary | ICD-10-CM | POA: Diagnosis not present

## 2018-08-03 DIAGNOSIS — L84 Corns and callosities: Secondary | ICD-10-CM | POA: Diagnosis not present

## 2018-08-03 DIAGNOSIS — M79676 Pain in unspecified toe(s): Secondary | ICD-10-CM | POA: Diagnosis not present

## 2018-08-03 DIAGNOSIS — E1142 Type 2 diabetes mellitus with diabetic polyneuropathy: Secondary | ICD-10-CM | POA: Diagnosis not present

## 2018-08-10 DIAGNOSIS — L570 Actinic keratosis: Secondary | ICD-10-CM | POA: Diagnosis not present

## 2018-08-10 DIAGNOSIS — L820 Inflamed seborrheic keratosis: Secondary | ICD-10-CM | POA: Diagnosis not present

## 2018-08-10 DIAGNOSIS — X32XXXD Exposure to sunlight, subsequent encounter: Secondary | ICD-10-CM | POA: Diagnosis not present

## 2018-09-21 DIAGNOSIS — Z23 Encounter for immunization: Secondary | ICD-10-CM | POA: Diagnosis not present

## 2018-09-22 DIAGNOSIS — C61 Malignant neoplasm of prostate: Secondary | ICD-10-CM | POA: Diagnosis not present

## 2018-09-22 DIAGNOSIS — R310 Gross hematuria: Secondary | ICD-10-CM | POA: Diagnosis not present

## 2018-10-12 DIAGNOSIS — E1142 Type 2 diabetes mellitus with diabetic polyneuropathy: Secondary | ICD-10-CM | POA: Diagnosis not present

## 2018-10-12 DIAGNOSIS — L84 Corns and callosities: Secondary | ICD-10-CM | POA: Diagnosis not present

## 2018-10-12 DIAGNOSIS — B351 Tinea unguium: Secondary | ICD-10-CM | POA: Diagnosis not present

## 2018-10-12 DIAGNOSIS — M79676 Pain in unspecified toe(s): Secondary | ICD-10-CM | POA: Diagnosis not present

## 2018-11-05 ENCOUNTER — Telehealth (HOSPITAL_COMMUNITY): Payer: Self-pay

## 2018-11-05 NOTE — Telephone Encounter (Signed)

## 2018-11-08 ENCOUNTER — Ambulatory Visit (HOSPITAL_COMMUNITY)
Admission: RE | Admit: 2018-11-08 | Discharge: 2018-11-08 | Disposition: A | Discharge status: Home / Self Care | Payer: Medicare Other | Source: Ambulatory Visit | Attending: Family | Admitting: Family

## 2018-11-08 ENCOUNTER — Other Ambulatory Visit: Payer: Self-pay

## 2018-11-08 DIAGNOSIS — R0989 Other specified symptoms and signs involving the circulatory and respiratory systems: Secondary | ICD-10-CM | POA: Diagnosis not present

## 2018-11-08 DIAGNOSIS — I6523 Occlusion and stenosis of bilateral carotid arteries: Secondary | ICD-10-CM | POA: Insufficient documentation

## 2018-11-10 ENCOUNTER — Encounter: Payer: Self-pay | Admitting: *Deleted

## 2018-11-10 NOTE — Telephone Encounter (Signed)
This encounter was created in error - please disregard.

## 2018-11-10 NOTE — Telephone Encounter (Deleted)
-----   Message from Minus Breeding, MD sent at 11/08/2018  4:02 PM EST ----- Right Carotid: Velocities in the right ICA are consistent with a 60-79%                stenosis Left Carotid: Velocities in the left ICA are consistent with a 1-39% stenosis. Follow up in one year.

## 2018-11-16 DIAGNOSIS — X32XXXD Exposure to sunlight, subsequent encounter: Secondary | ICD-10-CM | POA: Diagnosis not present

## 2018-11-16 DIAGNOSIS — L570 Actinic keratosis: Secondary | ICD-10-CM | POA: Diagnosis not present

## 2018-11-30 DIAGNOSIS — I6523 Occlusion and stenosis of bilateral carotid arteries: Secondary | ICD-10-CM | POA: Diagnosis not present

## 2018-11-30 DIAGNOSIS — E1151 Type 2 diabetes mellitus with diabetic peripheral angiopathy without gangrene: Secondary | ICD-10-CM | POA: Diagnosis not present

## 2018-11-30 DIAGNOSIS — E1122 Type 2 diabetes mellitus with diabetic chronic kidney disease: Secondary | ICD-10-CM | POA: Diagnosis not present

## 2018-11-30 DIAGNOSIS — R634 Abnormal weight loss: Secondary | ICD-10-CM | POA: Diagnosis not present

## 2018-11-30 DIAGNOSIS — N1831 Chronic kidney disease, stage 3a: Secondary | ICD-10-CM | POA: Diagnosis not present

## 2018-11-30 DIAGNOSIS — C61 Malignant neoplasm of prostate: Secondary | ICD-10-CM | POA: Diagnosis not present

## 2018-11-30 DIAGNOSIS — F17200 Nicotine dependence, unspecified, uncomplicated: Secondary | ICD-10-CM | POA: Diagnosis not present

## 2018-11-30 DIAGNOSIS — D494 Neoplasm of unspecified behavior of bladder: Secondary | ICD-10-CM | POA: Diagnosis not present

## 2018-11-30 DIAGNOSIS — I2510 Atherosclerotic heart disease of native coronary artery without angina pectoris: Secondary | ICD-10-CM | POA: Diagnosis not present

## 2018-11-30 DIAGNOSIS — J449 Chronic obstructive pulmonary disease, unspecified: Secondary | ICD-10-CM | POA: Diagnosis not present

## 2018-11-30 DIAGNOSIS — I129 Hypertensive chronic kidney disease with stage 1 through stage 4 chronic kidney disease, or unspecified chronic kidney disease: Secondary | ICD-10-CM | POA: Diagnosis not present

## 2018-12-21 DIAGNOSIS — L84 Corns and callosities: Secondary | ICD-10-CM | POA: Diagnosis not present

## 2018-12-21 DIAGNOSIS — B351 Tinea unguium: Secondary | ICD-10-CM | POA: Diagnosis not present

## 2018-12-21 DIAGNOSIS — E1142 Type 2 diabetes mellitus with diabetic polyneuropathy: Secondary | ICD-10-CM | POA: Diagnosis not present

## 2018-12-21 DIAGNOSIS — M79676 Pain in unspecified toe(s): Secondary | ICD-10-CM | POA: Diagnosis not present

## 2019-01-27 DIAGNOSIS — Z23 Encounter for immunization: Secondary | ICD-10-CM | POA: Diagnosis not present

## 2019-03-01 DIAGNOSIS — M79676 Pain in unspecified toe(s): Secondary | ICD-10-CM | POA: Diagnosis not present

## 2019-03-01 DIAGNOSIS — L84 Corns and callosities: Secondary | ICD-10-CM | POA: Diagnosis not present

## 2019-03-01 DIAGNOSIS — B351 Tinea unguium: Secondary | ICD-10-CM | POA: Diagnosis not present

## 2019-03-01 DIAGNOSIS — E1142 Type 2 diabetes mellitus with diabetic polyneuropathy: Secondary | ICD-10-CM | POA: Diagnosis not present

## 2019-03-04 DIAGNOSIS — Z23 Encounter for immunization: Secondary | ICD-10-CM | POA: Diagnosis not present

## 2019-03-18 DIAGNOSIS — R310 Gross hematuria: Secondary | ICD-10-CM | POA: Diagnosis not present

## 2019-03-18 DIAGNOSIS — C679 Malignant neoplasm of bladder, unspecified: Secondary | ICD-10-CM | POA: Diagnosis not present

## 2019-03-18 DIAGNOSIS — C61 Malignant neoplasm of prostate: Secondary | ICD-10-CM | POA: Diagnosis not present

## 2019-04-06 DIAGNOSIS — E669 Obesity, unspecified: Secondary | ICD-10-CM | POA: Diagnosis not present

## 2019-04-06 DIAGNOSIS — E1151 Type 2 diabetes mellitus with diabetic peripheral angiopathy without gangrene: Secondary | ICD-10-CM | POA: Diagnosis not present

## 2019-04-06 DIAGNOSIS — C61 Malignant neoplasm of prostate: Secondary | ICD-10-CM | POA: Diagnosis not present

## 2019-04-06 DIAGNOSIS — E785 Hyperlipidemia, unspecified: Secondary | ICD-10-CM | POA: Diagnosis not present

## 2019-04-06 DIAGNOSIS — J449 Chronic obstructive pulmonary disease, unspecified: Secondary | ICD-10-CM | POA: Diagnosis not present

## 2019-04-06 DIAGNOSIS — I6523 Occlusion and stenosis of bilateral carotid arteries: Secondary | ICD-10-CM | POA: Diagnosis not present

## 2019-04-06 DIAGNOSIS — N1831 Chronic kidney disease, stage 3a: Secondary | ICD-10-CM | POA: Diagnosis not present

## 2019-04-06 DIAGNOSIS — I129 Hypertensive chronic kidney disease with stage 1 through stage 4 chronic kidney disease, or unspecified chronic kidney disease: Secondary | ICD-10-CM | POA: Diagnosis not present

## 2019-04-06 DIAGNOSIS — D494 Neoplasm of unspecified behavior of bladder: Secondary | ICD-10-CM | POA: Diagnosis not present

## 2019-04-06 DIAGNOSIS — R634 Abnormal weight loss: Secondary | ICD-10-CM | POA: Diagnosis not present

## 2019-04-06 DIAGNOSIS — I2510 Atherosclerotic heart disease of native coronary artery without angina pectoris: Secondary | ICD-10-CM | POA: Diagnosis not present

## 2019-05-10 DIAGNOSIS — E119 Type 2 diabetes mellitus without complications: Secondary | ICD-10-CM | POA: Diagnosis not present

## 2019-05-10 DIAGNOSIS — H2513 Age-related nuclear cataract, bilateral: Secondary | ICD-10-CM | POA: Diagnosis not present

## 2019-05-10 DIAGNOSIS — H5203 Hypermetropia, bilateral: Secondary | ICD-10-CM | POA: Diagnosis not present

## 2019-05-10 DIAGNOSIS — H524 Presbyopia: Secondary | ICD-10-CM | POA: Diagnosis not present

## 2019-05-10 DIAGNOSIS — H35372 Puckering of macula, left eye: Secondary | ICD-10-CM | POA: Diagnosis not present

## 2019-05-10 DIAGNOSIS — H52223 Regular astigmatism, bilateral: Secondary | ICD-10-CM | POA: Diagnosis not present

## 2019-05-24 DIAGNOSIS — E1142 Type 2 diabetes mellitus with diabetic polyneuropathy: Secondary | ICD-10-CM | POA: Diagnosis not present

## 2019-05-24 DIAGNOSIS — L84 Corns and callosities: Secondary | ICD-10-CM | POA: Diagnosis not present

## 2019-05-24 DIAGNOSIS — M79676 Pain in unspecified toe(s): Secondary | ICD-10-CM | POA: Diagnosis not present

## 2019-05-24 DIAGNOSIS — B351 Tinea unguium: Secondary | ICD-10-CM | POA: Diagnosis not present

## 2019-07-11 IMAGING — CT CT ABD-PELV W/ CM
2 of 5 series · 16 of 46 positions shown, 18 images · IV contrast (Isovue)
Comparison: None.

CLINICAL DATA: Acute left flank pain.

EXAM:
CT ABDOMEN AND PELVIS WITH CONTRAST
TECHNIQUE: Multidetector CT imaging of the abdomen and pelvis was performed
using the standard protocol following bolus administration of
intravenous contrast.
CONTRAST:  75mL OMNIPAQUE IOHEXOL 300 MG/ML  SOLN

[Series 2: axial st · axial · 0.79mm/px · z∈[+808,+1253]mm · 13 of 101 slices shown, 15 images]
[im 6/101  soft-tissue]
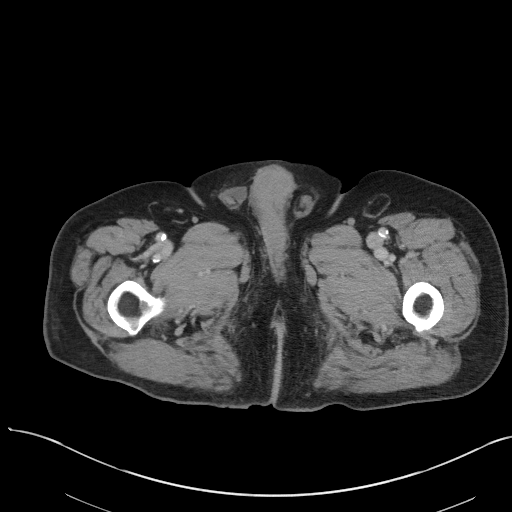
[im 6/101  bone]
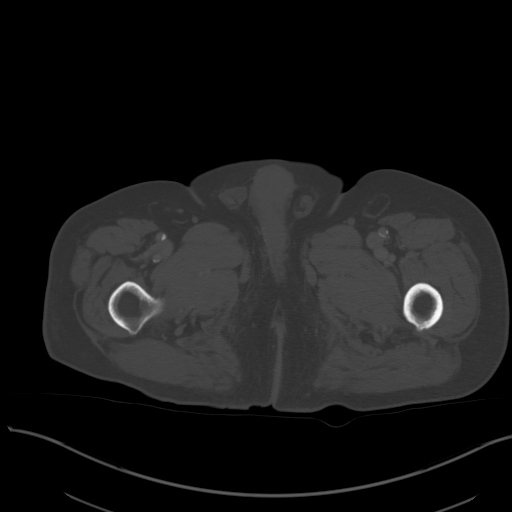
[im 12/101  soft-tissue]
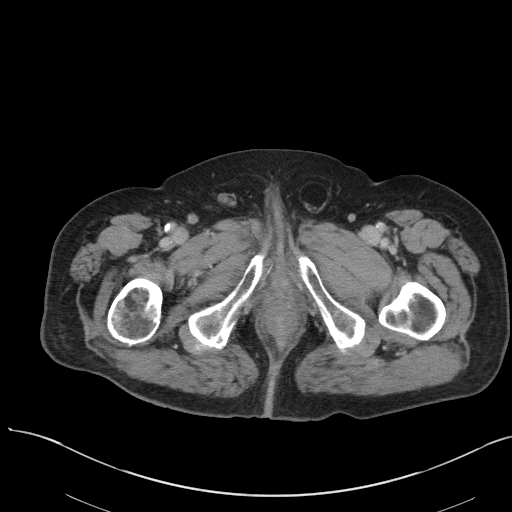
[im 24/101  soft-tissue]
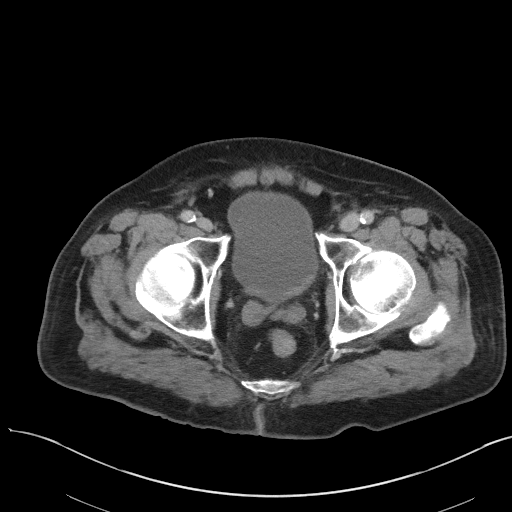
[im 30/101  soft-tissue]
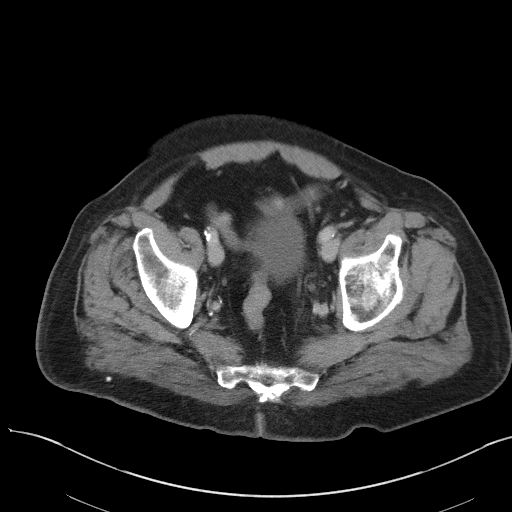
[im 36/101  soft-tissue]
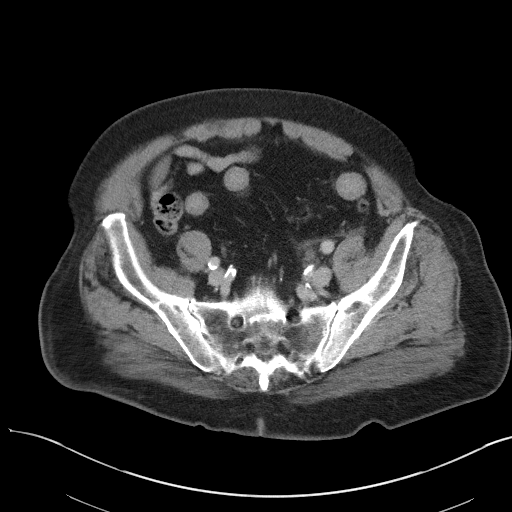
[im 42/101  soft-tissue]
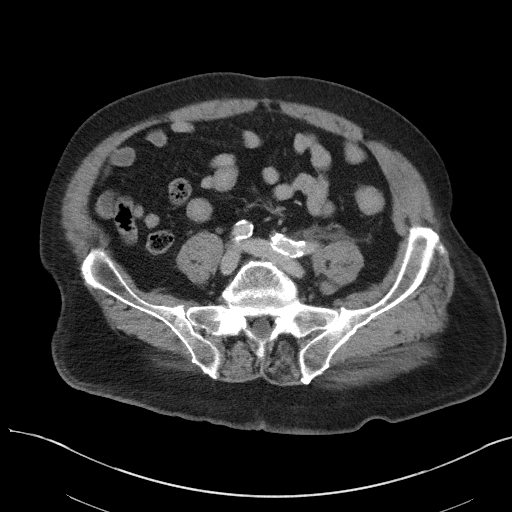
[im 53/101  soft-tissue]
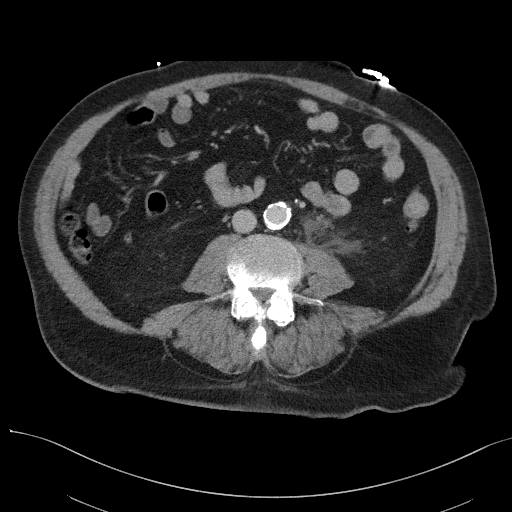
[im 59/101  soft-tissue]
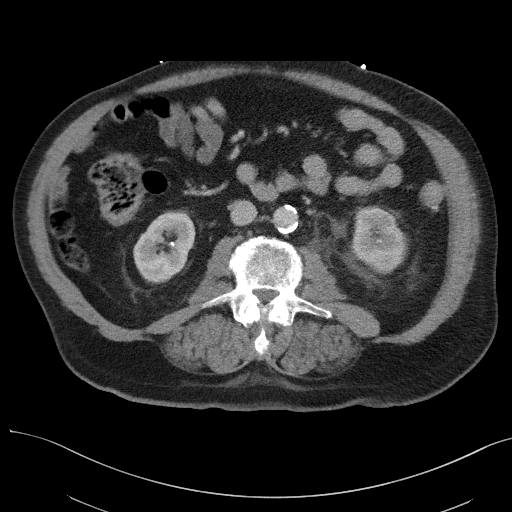
[im 65/101  soft-tissue]
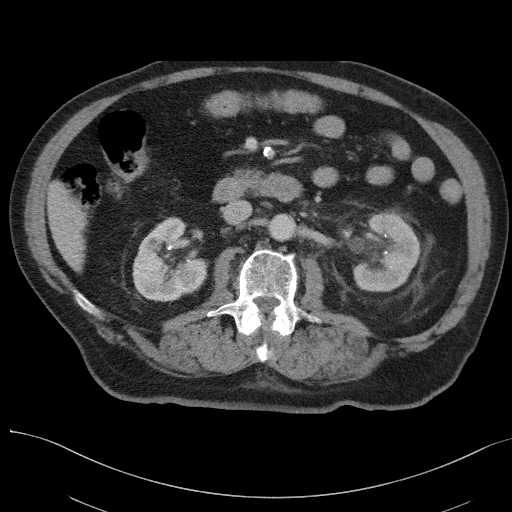
[im 65/101  bone]
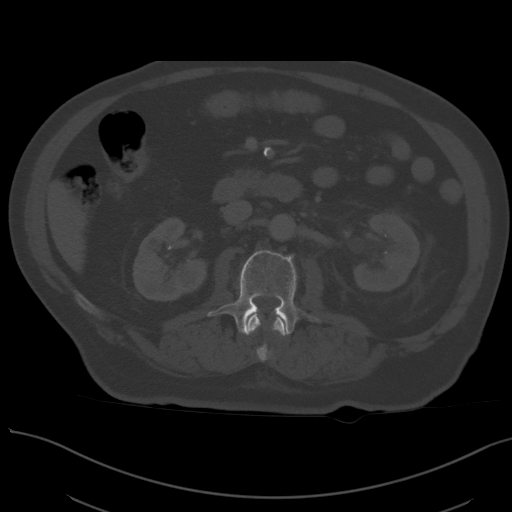
[im 71/101  soft-tissue]
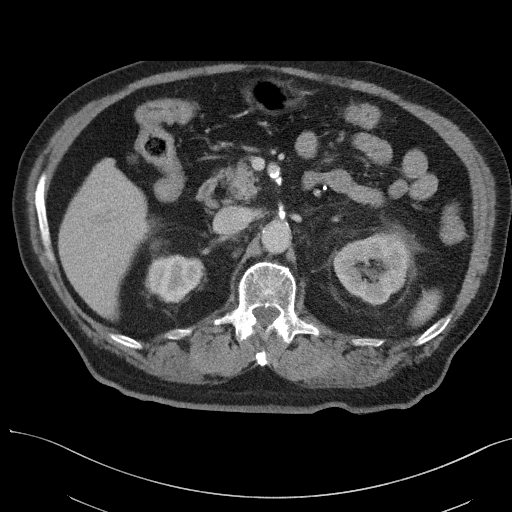
[im 77/101  soft-tissue]
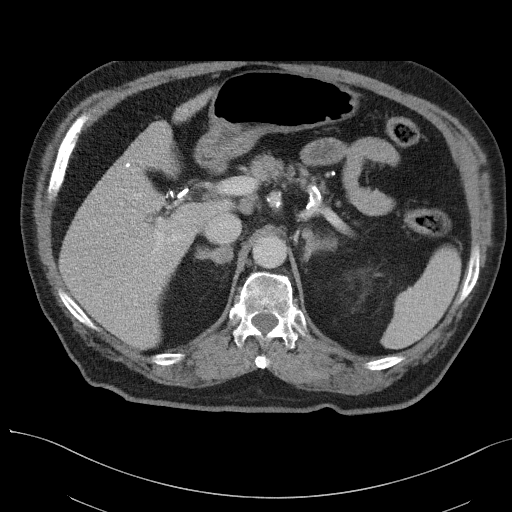
[im 89/101  soft-tissue]
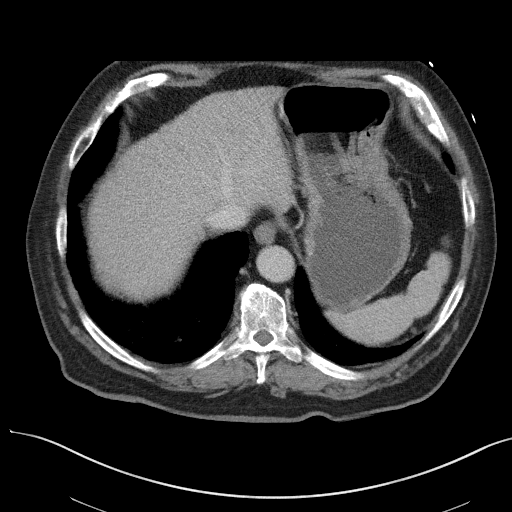
[im 95/101  soft-tissue]
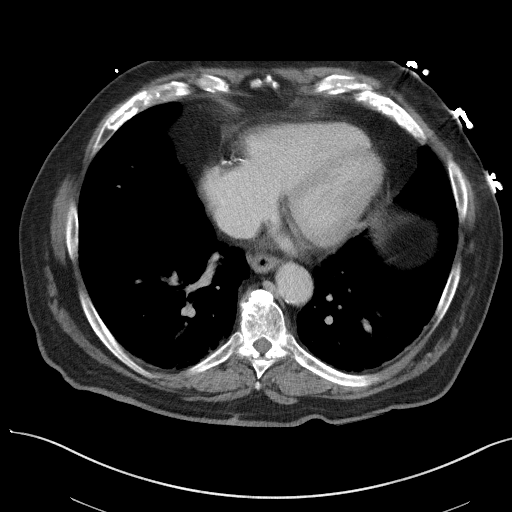

[Series 6: coronal st · coronal · 0.79mm/px · 3 of 107 slices shown]
[im 36/107  soft-tissue]
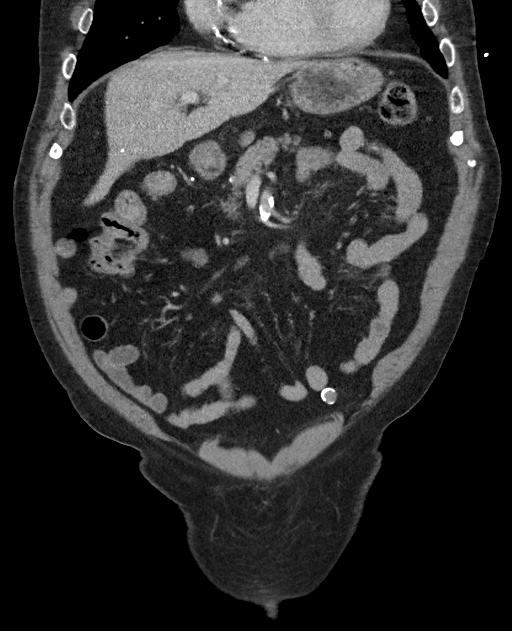
[im 48/107  soft-tissue]
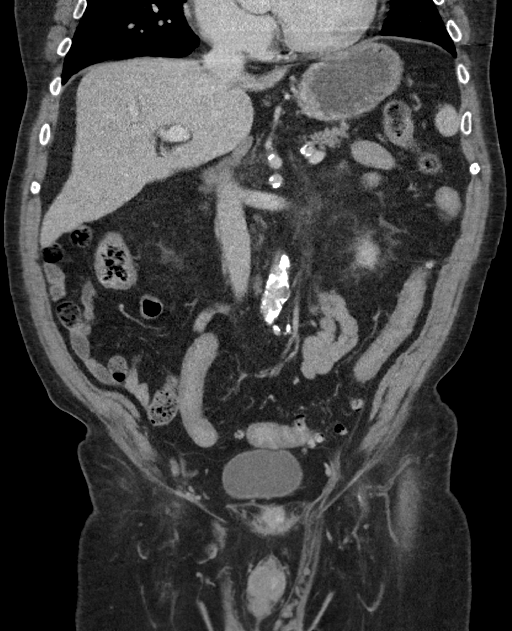
[im 59/107  soft-tissue]
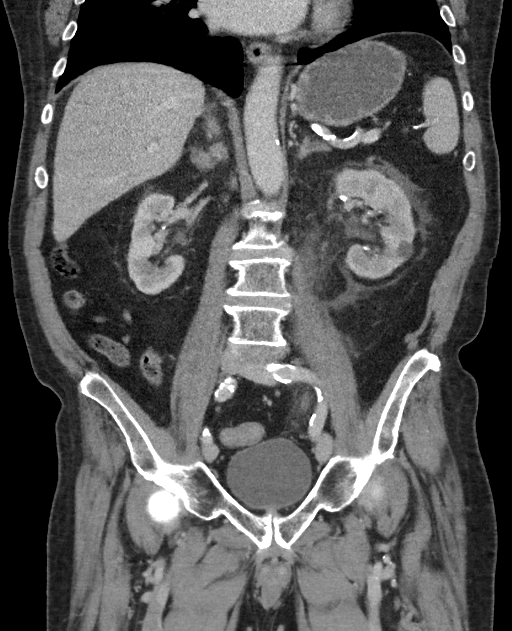

[16 of 46 positions shown; findings below may reference images not displayed]

FINDINGS: Lower chest: No acute abnormality.

Hepatobiliary: No focal liver abnormality is seen. Status post
cholecystectomy. No biliary dilatation.

Pancreas: Unremarkable. No pancreatic ductal dilatation or
surrounding inflammatory changes.

Spleen: Normal in size without focal abnormality.

Adrenals/Urinary Tract: Adrenal glands appear normal. Extensive
vascular calcifications are seen in both kidneys. Right kidney and
ureter are otherwise unremarkable. Mild left hydroureteronephrosis
is noted with perinephric and periureteral stranding without
obstructing calculus. Left renal cysts are noted. There is some
asymmetrical wall thickening seen posteriorly within the urinary
bladder.

Stomach/Bowel: Stomach is within normal limits. Appendix appears
normal. No evidence of bowel wall thickening, distention, or
inflammatory changes. Sigmoid diverticulosis is noted without
inflammation.

Vascular/Lymphatic: Aortic atherosclerosis. No enlarged abdominal or
pelvic lymph nodes.

Reproductive: Prostate is unremarkable.

Other: Small fat containing left inguinal hernia is noted. No
ventral hernia is noted.

Musculoskeletal: No acute or significant osseous findings.
IMPRESSION: Mild left hydroureteronephrosis is noted without obstructing
calculus. Potentially this may be due to recently passed stone.
There is noted a moderate amount of perinephric and periureteral
stranding suggesting inflammation, although the possibility of
ureteral perforation cannot be excluded.

Asymmetrical wall thickening is seen posteriorly within the urinary
bladder; cystoscopy is recommended to rule out neoplasm.

Sigmoid diverticulosis without inflammation.

Small fat containing left inguinal hernia.

Aortic Atherosclerosis ([id number]-6C8.8).

## 2019-08-15 DIAGNOSIS — E669 Obesity, unspecified: Secondary | ICD-10-CM | POA: Diagnosis not present

## 2019-08-15 DIAGNOSIS — I129 Hypertensive chronic kidney disease with stage 1 through stage 4 chronic kidney disease, or unspecified chronic kidney disease: Secondary | ICD-10-CM | POA: Diagnosis not present

## 2019-08-15 DIAGNOSIS — R634 Abnormal weight loss: Secondary | ICD-10-CM | POA: Diagnosis not present

## 2019-08-15 DIAGNOSIS — J449 Chronic obstructive pulmonary disease, unspecified: Secondary | ICD-10-CM | POA: Diagnosis not present

## 2019-08-15 DIAGNOSIS — C61 Malignant neoplasm of prostate: Secondary | ICD-10-CM | POA: Diagnosis not present

## 2019-08-15 DIAGNOSIS — R195 Other fecal abnormalities: Secondary | ICD-10-CM | POA: Diagnosis not present

## 2019-08-15 DIAGNOSIS — I6523 Occlusion and stenosis of bilateral carotid arteries: Secondary | ICD-10-CM | POA: Diagnosis not present

## 2019-08-15 DIAGNOSIS — N1831 Chronic kidney disease, stage 3a: Secondary | ICD-10-CM | POA: Diagnosis not present

## 2019-08-15 DIAGNOSIS — I2510 Atherosclerotic heart disease of native coronary artery without angina pectoris: Secondary | ICD-10-CM | POA: Diagnosis not present

## 2019-08-15 DIAGNOSIS — E1151 Type 2 diabetes mellitus with diabetic peripheral angiopathy without gangrene: Secondary | ICD-10-CM | POA: Diagnosis not present

## 2019-08-15 DIAGNOSIS — D494 Neoplasm of unspecified behavior of bladder: Secondary | ICD-10-CM | POA: Diagnosis not present

## 2019-08-15 DIAGNOSIS — E785 Hyperlipidemia, unspecified: Secondary | ICD-10-CM | POA: Diagnosis not present

## 2019-09-06 DIAGNOSIS — B351 Tinea unguium: Secondary | ICD-10-CM | POA: Diagnosis not present

## 2019-09-06 DIAGNOSIS — L84 Corns and callosities: Secondary | ICD-10-CM | POA: Diagnosis not present

## 2019-09-06 DIAGNOSIS — E1142 Type 2 diabetes mellitus with diabetic polyneuropathy: Secondary | ICD-10-CM | POA: Diagnosis not present

## 2019-09-06 DIAGNOSIS — M79676 Pain in unspecified toe(s): Secondary | ICD-10-CM | POA: Diagnosis not present

## 2019-10-03 DIAGNOSIS — R310 Gross hematuria: Secondary | ICD-10-CM | POA: Diagnosis not present

## 2019-10-03 DIAGNOSIS — C61 Malignant neoplasm of prostate: Secondary | ICD-10-CM | POA: Diagnosis not present

## 2019-10-03 DIAGNOSIS — C678 Malignant neoplasm of overlapping sites of bladder: Secondary | ICD-10-CM | POA: Diagnosis not present

## 2019-10-03 DIAGNOSIS — R9721 Rising PSA following treatment for malignant neoplasm of prostate: Secondary | ICD-10-CM | POA: Diagnosis not present

## 2019-10-27 DIAGNOSIS — Z23 Encounter for immunization: Secondary | ICD-10-CM | POA: Diagnosis not present

## 2019-11-22 DIAGNOSIS — B351 Tinea unguium: Secondary | ICD-10-CM | POA: Diagnosis not present

## 2019-11-22 DIAGNOSIS — M79676 Pain in unspecified toe(s): Secondary | ICD-10-CM | POA: Diagnosis not present

## 2019-11-22 DIAGNOSIS — E1142 Type 2 diabetes mellitus with diabetic polyneuropathy: Secondary | ICD-10-CM | POA: Diagnosis not present

## 2019-11-22 DIAGNOSIS — L84 Corns and callosities: Secondary | ICD-10-CM | POA: Diagnosis not present

## 2019-12-23 DIAGNOSIS — E669 Obesity, unspecified: Secondary | ICD-10-CM | POA: Diagnosis not present

## 2019-12-23 DIAGNOSIS — I129 Hypertensive chronic kidney disease with stage 1 through stage 4 chronic kidney disease, or unspecified chronic kidney disease: Secondary | ICD-10-CM | POA: Diagnosis not present

## 2019-12-23 DIAGNOSIS — D494 Neoplasm of unspecified behavior of bladder: Secondary | ICD-10-CM | POA: Diagnosis not present

## 2019-12-23 DIAGNOSIS — R634 Abnormal weight loss: Secondary | ICD-10-CM | POA: Diagnosis not present

## 2019-12-23 DIAGNOSIS — Z23 Encounter for immunization: Secondary | ICD-10-CM | POA: Diagnosis not present

## 2019-12-23 DIAGNOSIS — N1831 Chronic kidney disease, stage 3a: Secondary | ICD-10-CM | POA: Diagnosis not present

## 2019-12-23 DIAGNOSIS — C61 Malignant neoplasm of prostate: Secondary | ICD-10-CM | POA: Diagnosis not present

## 2019-12-23 DIAGNOSIS — R195 Other fecal abnormalities: Secondary | ICD-10-CM | POA: Diagnosis not present

## 2019-12-23 DIAGNOSIS — J449 Chronic obstructive pulmonary disease, unspecified: Secondary | ICD-10-CM | POA: Diagnosis not present

## 2019-12-23 DIAGNOSIS — I2510 Atherosclerotic heart disease of native coronary artery without angina pectoris: Secondary | ICD-10-CM | POA: Diagnosis not present

## 2019-12-23 DIAGNOSIS — E1151 Type 2 diabetes mellitus with diabetic peripheral angiopathy without gangrene: Secondary | ICD-10-CM | POA: Diagnosis not present

## 2019-12-23 DIAGNOSIS — E785 Hyperlipidemia, unspecified: Secondary | ICD-10-CM | POA: Diagnosis not present

## 2019-12-23 DIAGNOSIS — I6523 Occlusion and stenosis of bilateral carotid arteries: Secondary | ICD-10-CM | POA: Diagnosis not present

## 2020-01-31 DIAGNOSIS — M79676 Pain in unspecified toe(s): Secondary | ICD-10-CM | POA: Diagnosis not present

## 2020-01-31 DIAGNOSIS — L84 Corns and callosities: Secondary | ICD-10-CM | POA: Diagnosis not present

## 2020-01-31 DIAGNOSIS — B351 Tinea unguium: Secondary | ICD-10-CM | POA: Diagnosis not present

## 2020-01-31 DIAGNOSIS — E1142 Type 2 diabetes mellitus with diabetic polyneuropathy: Secondary | ICD-10-CM | POA: Diagnosis not present

## 2020-03-21 DIAGNOSIS — C61 Malignant neoplasm of prostate: Secondary | ICD-10-CM | POA: Diagnosis not present

## 2020-03-21 DIAGNOSIS — Z8551 Personal history of malignant neoplasm of bladder: Secondary | ICD-10-CM | POA: Diagnosis not present

## 2020-04-25 DIAGNOSIS — E785 Hyperlipidemia, unspecified: Secondary | ICD-10-CM | POA: Diagnosis not present

## 2020-04-25 DIAGNOSIS — N1831 Chronic kidney disease, stage 3a: Secondary | ICD-10-CM | POA: Diagnosis not present

## 2020-04-25 DIAGNOSIS — I129 Hypertensive chronic kidney disease with stage 1 through stage 4 chronic kidney disease, or unspecified chronic kidney disease: Secondary | ICD-10-CM | POA: Diagnosis not present

## 2020-04-25 DIAGNOSIS — D494 Neoplasm of unspecified behavior of bladder: Secondary | ICD-10-CM | POA: Diagnosis not present

## 2020-04-25 DIAGNOSIS — C61 Malignant neoplasm of prostate: Secondary | ICD-10-CM | POA: Diagnosis not present

## 2020-04-25 DIAGNOSIS — I6523 Occlusion and stenosis of bilateral carotid arteries: Secondary | ICD-10-CM | POA: Diagnosis not present

## 2020-04-25 DIAGNOSIS — R195 Other fecal abnormalities: Secondary | ICD-10-CM | POA: Diagnosis not present

## 2020-04-25 DIAGNOSIS — R634 Abnormal weight loss: Secondary | ICD-10-CM | POA: Diagnosis not present

## 2020-04-25 DIAGNOSIS — I2510 Atherosclerotic heart disease of native coronary artery without angina pectoris: Secondary | ICD-10-CM | POA: Diagnosis not present

## 2020-04-25 DIAGNOSIS — J449 Chronic obstructive pulmonary disease, unspecified: Secondary | ICD-10-CM | POA: Diagnosis not present

## 2020-04-25 DIAGNOSIS — E669 Obesity, unspecified: Secondary | ICD-10-CM | POA: Diagnosis not present

## 2020-04-25 DIAGNOSIS — E1151 Type 2 diabetes mellitus with diabetic peripheral angiopathy without gangrene: Secondary | ICD-10-CM | POA: Diagnosis not present

## 2020-06-13 DIAGNOSIS — I10 Essential (primary) hypertension: Secondary | ICD-10-CM | POA: Diagnosis not present

## 2020-06-13 DIAGNOSIS — H5203 Hypermetropia, bilateral: Secondary | ICD-10-CM | POA: Diagnosis not present

## 2020-06-13 DIAGNOSIS — H52223 Regular astigmatism, bilateral: Secondary | ICD-10-CM | POA: Diagnosis not present

## 2020-06-13 DIAGNOSIS — H35372 Puckering of macula, left eye: Secondary | ICD-10-CM | POA: Diagnosis not present

## 2020-06-13 DIAGNOSIS — E119 Type 2 diabetes mellitus without complications: Secondary | ICD-10-CM | POA: Diagnosis not present

## 2020-06-13 DIAGNOSIS — H35373 Puckering of macula, bilateral: Secondary | ICD-10-CM | POA: Diagnosis not present

## 2020-06-13 DIAGNOSIS — H35033 Hypertensive retinopathy, bilateral: Secondary | ICD-10-CM | POA: Diagnosis not present

## 2020-06-13 DIAGNOSIS — H2513 Age-related nuclear cataract, bilateral: Secondary | ICD-10-CM | POA: Diagnosis not present

## 2020-06-19 DIAGNOSIS — E1142 Type 2 diabetes mellitus with diabetic polyneuropathy: Secondary | ICD-10-CM | POA: Diagnosis not present

## 2020-06-19 DIAGNOSIS — B351 Tinea unguium: Secondary | ICD-10-CM | POA: Diagnosis not present

## 2020-06-19 DIAGNOSIS — M79676 Pain in unspecified toe(s): Secondary | ICD-10-CM | POA: Diagnosis not present

## 2020-06-19 DIAGNOSIS — L84 Corns and callosities: Secondary | ICD-10-CM | POA: Diagnosis not present

## 2020-07-06 DIAGNOSIS — Z20822 Contact with and (suspected) exposure to covid-19: Secondary | ICD-10-CM | POA: Diagnosis not present

## 2020-07-14 ENCOUNTER — Emergency Department (HOSPITAL_COMMUNITY)
Admission: EM | Admit: 2020-07-14 | Discharge: 2020-07-15 | Disposition: A | Discharge status: Home / Self Care | Payer: Medicare Other | Attending: Emergency Medicine | Admitting: Emergency Medicine

## 2020-07-14 ENCOUNTER — Other Ambulatory Visit: Payer: Self-pay

## 2020-07-14 ENCOUNTER — Encounter (HOSPITAL_COMMUNITY): Payer: Self-pay | Admitting: Emergency Medicine

## 2020-07-14 DIAGNOSIS — F329 Major depressive disorder, single episode, unspecified: Secondary | ICD-10-CM | POA: Diagnosis not present

## 2020-07-14 DIAGNOSIS — Z794 Long term (current) use of insulin: Secondary | ICD-10-CM | POA: Diagnosis not present

## 2020-07-14 DIAGNOSIS — Z85828 Personal history of other malignant neoplasm of skin: Secondary | ICD-10-CM | POA: Diagnosis not present

## 2020-07-14 DIAGNOSIS — E119 Type 2 diabetes mellitus without complications: Secondary | ICD-10-CM | POA: Diagnosis not present

## 2020-07-14 DIAGNOSIS — Z79899 Other long term (current) drug therapy: Secondary | ICD-10-CM | POA: Diagnosis not present

## 2020-07-14 DIAGNOSIS — Z7902 Long term (current) use of antithrombotics/antiplatelets: Secondary | ICD-10-CM | POA: Diagnosis not present

## 2020-07-14 DIAGNOSIS — I10 Essential (primary) hypertension: Secondary | ICD-10-CM | POA: Diagnosis not present

## 2020-07-14 DIAGNOSIS — Z8546 Personal history of malignant neoplasm of prostate: Secondary | ICD-10-CM | POA: Diagnosis not present

## 2020-07-14 DIAGNOSIS — F32A Depression, unspecified: Secondary | ICD-10-CM | POA: Diagnosis not present

## 2020-07-14 DIAGNOSIS — I2510 Atherosclerotic heart disease of native coronary artery without angina pectoris: Secondary | ICD-10-CM | POA: Diagnosis not present

## 2020-07-14 DIAGNOSIS — F17210 Nicotine dependence, cigarettes, uncomplicated: Secondary | ICD-10-CM | POA: Diagnosis not present

## 2020-07-14 DIAGNOSIS — J449 Chronic obstructive pulmonary disease, unspecified: Secondary | ICD-10-CM | POA: Insufficient documentation

## 2020-07-14 LAB — COMPREHENSIVE METABOLIC PANEL
ALT: 21 U/L (ref 0–44)
AST: 20 U/L (ref 15–41)
Albumin: 3.6 g/dL (ref 3.5–5.0)
Alkaline Phosphatase: 70 U/L (ref 38–126)
Anion gap: 8 (ref 5–15)
BUN: 33 mg/dL — ABNORMAL HIGH (ref 8–23)
CO2: 20 mmol/L — ABNORMAL LOW (ref 22–32)
Calcium: 9.2 mg/dL (ref 8.9–10.3)
Chloride: 111 mmol/L (ref 98–111)
Creatinine, Ser: 1.63 mg/dL — ABNORMAL HIGH (ref 0.61–1.24)
GFR, Estimated: 41 mL/min — ABNORMAL LOW (ref >60)
Glucose, Bld: 115 mg/dL — ABNORMAL HIGH (ref 70–99)
Potassium: 4.4 mmol/L (ref 3.5–5.1)
Sodium: 139 mmol/L (ref 135–145)
Total Bilirubin: 0.8 mg/dL (ref 0.3–1.2)
Total Protein: 6.4 g/dL — ABNORMAL LOW (ref 6.5–8.1)

## 2020-07-14 LAB — CBC WITH DIFFERENTIAL/PLATELET
Abs Immature Granulocytes: 0.03 10*3/uL (ref 0.00–0.07)
Basophils Absolute: 0 10*3/uL (ref 0.0–0.1)
Basophils Relative: 1 %
Eosinophils Absolute: 0.1 10*3/uL (ref 0.0–0.5)
Eosinophils Relative: 1 %
HCT: 38.8 % — ABNORMAL LOW (ref 39.0–52.0)
Hemoglobin: 12.6 g/dL — ABNORMAL LOW (ref 13.0–17.0)
Immature Granulocytes: 0 %
Lymphocytes Relative: 22 %
Lymphs Abs: 1.6 10*3/uL (ref 0.7–4.0)
MCH: 31.2 pg (ref 26.0–34.0)
MCHC: 32.5 g/dL (ref 30.0–36.0)
MCV: 96 fL (ref 80.0–100.0)
Monocytes Absolute: 0.5 10*3/uL (ref 0.1–1.0)
Monocytes Relative: 8 %
Neutro Abs: 5 10*3/uL (ref 1.7–7.7)
Neutrophils Relative %: 68 %
Platelets: 144 10*3/uL — ABNORMAL LOW (ref 150–400)
RBC: 4.04 MIL/uL — ABNORMAL LOW (ref 4.22–5.81)
RDW: 13.4 % (ref 11.5–15.5)
WBC: 7.2 10*3/uL (ref 4.0–10.5)
nRBC: 0 % (ref 0.0–0.2)

## 2020-07-14 LAB — URINALYSIS, ROUTINE W REFLEX MICROSCOPIC
Bacteria, UA: NONE SEEN
Bilirubin Urine: NEGATIVE
Glucose, UA: NEGATIVE mg/dL
Ketones, ur: NEGATIVE mg/dL
Leukocytes,Ua: NEGATIVE
Nitrite: NEGATIVE
Protein, ur: NEGATIVE mg/dL
Specific Gravity, Urine: 1.011 (ref 1.005–1.030)
pH: 5 (ref 5.0–8.0)

## 2020-07-14 LAB — ETHANOL: Alcohol, Ethyl (B): <10 mg/dL (ref <10)

## 2020-07-14 LAB — RAPID URINE DRUG SCREEN, HOSP PERFORMED
Amphetamines: NOT DETECTED
Barbiturates: NOT DETECTED
Benzodiazepines: NOT DETECTED
Cocaine: NOT DETECTED
Opiates: NOT DETECTED
Tetrahydrocannabinol: NOT DETECTED
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
## 2020-07-14 NOTE — ED Notes (Signed)
Dinner tray ordered.
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
## 2020-07-14 NOTE — BHH Counselor (Signed)
Sharl Ma, NP and TTS called patient's daughter, [name]. Patrice inquired if she could speak with one of the patient's other children to assist with establishing a safety plan. Donnell refused to provide the  information and discussed sending her dad to [employer] [facility]. Patrice explained to the daughter his mental health care would be provided by the same team of [facility]. Patrice inquired about different treatment as well as living options in the home. [name] report the living situation in the home would remain the same.
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
## 2020-07-15 NOTE — ED Notes (Signed)
Breakfast Ordered 

## 2020-07-19 DIAGNOSIS — C61 Malignant neoplasm of prostate: Secondary | ICD-10-CM | POA: Diagnosis not present

## 2020-07-19 DIAGNOSIS — E1151 Type 2 diabetes mellitus with diabetic peripheral angiopathy without gangrene: Secondary | ICD-10-CM | POA: Diagnosis not present

## 2020-07-19 DIAGNOSIS — R451 Restlessness and agitation: Secondary | ICD-10-CM | POA: Diagnosis not present

## 2020-07-19 DIAGNOSIS — F439 Reaction to severe stress, unspecified: Secondary | ICD-10-CM | POA: Diagnosis not present

## 2020-07-19 DIAGNOSIS — R634 Abnormal weight loss: Secondary | ICD-10-CM | POA: Diagnosis not present

## 2020-07-19 DIAGNOSIS — N1831 Chronic kidney disease, stage 3a: Secondary | ICD-10-CM | POA: Diagnosis not present

## 2020-07-19 DIAGNOSIS — I6523 Occlusion and stenosis of bilateral carotid arteries: Secondary | ICD-10-CM | POA: Diagnosis not present

## 2020-07-19 DIAGNOSIS — I129 Hypertensive chronic kidney disease with stage 1 through stage 4 chronic kidney disease, or unspecified chronic kidney disease: Secondary | ICD-10-CM | POA: Diagnosis not present

## 2020-07-19 DIAGNOSIS — E785 Hyperlipidemia, unspecified: Secondary | ICD-10-CM | POA: Diagnosis not present

## 2020-07-19 DIAGNOSIS — R195 Other fecal abnormalities: Secondary | ICD-10-CM | POA: Diagnosis not present

## 2020-07-19 DIAGNOSIS — I2510 Atherosclerotic heart disease of native coronary artery without angina pectoris: Secondary | ICD-10-CM | POA: Diagnosis not present

## 2020-07-19 DIAGNOSIS — J449 Chronic obstructive pulmonary disease, unspecified: Secondary | ICD-10-CM | POA: Diagnosis not present

## 2020-07-24 DIAGNOSIS — H25043 Posterior subcapsular polar age-related cataract, bilateral: Secondary | ICD-10-CM | POA: Diagnosis not present

## 2020-07-24 DIAGNOSIS — H35372 Puckering of macula, left eye: Secondary | ICD-10-CM | POA: Diagnosis not present

## 2020-07-24 DIAGNOSIS — H25013 Cortical age-related cataract, bilateral: Secondary | ICD-10-CM | POA: Diagnosis not present

## 2020-07-24 DIAGNOSIS — H18413 Arcus senilis, bilateral: Secondary | ICD-10-CM | POA: Diagnosis not present

## 2020-07-24 DIAGNOSIS — H2513 Age-related nuclear cataract, bilateral: Secondary | ICD-10-CM | POA: Diagnosis not present

## 2020-07-24 DIAGNOSIS — H2512 Age-related nuclear cataract, left eye: Secondary | ICD-10-CM | POA: Diagnosis not present

## 2020-08-28 DIAGNOSIS — M79676 Pain in unspecified toe(s): Secondary | ICD-10-CM | POA: Diagnosis not present

## 2020-08-28 DIAGNOSIS — E1142 Type 2 diabetes mellitus with diabetic polyneuropathy: Secondary | ICD-10-CM | POA: Diagnosis not present

## 2020-08-28 DIAGNOSIS — B351 Tinea unguium: Secondary | ICD-10-CM | POA: Diagnosis not present

## 2020-08-28 DIAGNOSIS — L84 Corns and callosities: Secondary | ICD-10-CM | POA: Diagnosis not present

## 2020-09-12 DIAGNOSIS — I129 Hypertensive chronic kidney disease with stage 1 through stage 4 chronic kidney disease, or unspecified chronic kidney disease: Secondary | ICD-10-CM | POA: Diagnosis not present

## 2020-09-12 DIAGNOSIS — R195 Other fecal abnormalities: Secondary | ICD-10-CM | POA: Diagnosis not present

## 2020-09-12 DIAGNOSIS — R634 Abnormal weight loss: Secondary | ICD-10-CM | POA: Diagnosis not present

## 2020-09-12 DIAGNOSIS — D494 Neoplasm of unspecified behavior of bladder: Secondary | ICD-10-CM | POA: Diagnosis not present

## 2020-09-12 DIAGNOSIS — E785 Hyperlipidemia, unspecified: Secondary | ICD-10-CM | POA: Diagnosis not present

## 2020-09-12 DIAGNOSIS — R451 Restlessness and agitation: Secondary | ICD-10-CM | POA: Diagnosis not present

## 2020-09-12 DIAGNOSIS — N1831 Chronic kidney disease, stage 3a: Secondary | ICD-10-CM | POA: Diagnosis not present

## 2020-09-12 DIAGNOSIS — C61 Malignant neoplasm of prostate: Secondary | ICD-10-CM | POA: Diagnosis not present

## 2020-09-12 DIAGNOSIS — J449 Chronic obstructive pulmonary disease, unspecified: Secondary | ICD-10-CM | POA: Diagnosis not present

## 2020-09-12 DIAGNOSIS — E669 Obesity, unspecified: Secondary | ICD-10-CM | POA: Diagnosis not present

## 2020-09-12 DIAGNOSIS — I2510 Atherosclerotic heart disease of native coronary artery without angina pectoris: Secondary | ICD-10-CM | POA: Diagnosis not present

## 2020-09-12 DIAGNOSIS — E1151 Type 2 diabetes mellitus with diabetic peripheral angiopathy without gangrene: Secondary | ICD-10-CM | POA: Diagnosis not present

## 2020-09-21 DIAGNOSIS — H2511 Age-related nuclear cataract, right eye: Secondary | ICD-10-CM | POA: Diagnosis not present

## 2020-09-21 DIAGNOSIS — H2512 Age-related nuclear cataract, left eye: Secondary | ICD-10-CM | POA: Diagnosis not present

## 2020-10-15 DIAGNOSIS — Z23 Encounter for immunization: Secondary | ICD-10-CM | POA: Diagnosis not present

## 2020-11-06 DIAGNOSIS — E1142 Type 2 diabetes mellitus with diabetic polyneuropathy: Secondary | ICD-10-CM | POA: Diagnosis not present

## 2020-11-06 DIAGNOSIS — M79676 Pain in unspecified toe(s): Secondary | ICD-10-CM | POA: Diagnosis not present

## 2020-11-06 DIAGNOSIS — L84 Corns and callosities: Secondary | ICD-10-CM | POA: Diagnosis not present

## 2020-11-06 DIAGNOSIS — B351 Tinea unguium: Secondary | ICD-10-CM | POA: Diagnosis not present

## 2021-01-15 DIAGNOSIS — M79676 Pain in unspecified toe(s): Secondary | ICD-10-CM | POA: Diagnosis not present

## 2021-01-15 DIAGNOSIS — L84 Corns and callosities: Secondary | ICD-10-CM | POA: Diagnosis not present

## 2021-01-15 DIAGNOSIS — B351 Tinea unguium: Secondary | ICD-10-CM | POA: Diagnosis not present

## 2021-01-15 DIAGNOSIS — E1142 Type 2 diabetes mellitus with diabetic polyneuropathy: Secondary | ICD-10-CM | POA: Diagnosis not present

## 2021-02-11 DIAGNOSIS — Z20822 Contact with and (suspected) exposure to covid-19: Secondary | ICD-10-CM | POA: Diagnosis not present

## 2021-02-19 DIAGNOSIS — I2510 Atherosclerotic heart disease of native coronary artery without angina pectoris: Secondary | ICD-10-CM | POA: Diagnosis not present

## 2021-02-19 DIAGNOSIS — Z125 Encounter for screening for malignant neoplasm of prostate: Secondary | ICD-10-CM | POA: Diagnosis not present

## 2021-02-19 DIAGNOSIS — E785 Hyperlipidemia, unspecified: Secondary | ICD-10-CM | POA: Diagnosis not present

## 2021-02-19 DIAGNOSIS — E1151 Type 2 diabetes mellitus with diabetic peripheral angiopathy without gangrene: Secondary | ICD-10-CM | POA: Diagnosis not present

## 2021-03-06 DIAGNOSIS — R634 Abnormal weight loss: Secondary | ICD-10-CM | POA: Diagnosis not present

## 2021-03-06 DIAGNOSIS — E1151 Type 2 diabetes mellitus with diabetic peripheral angiopathy without gangrene: Secondary | ICD-10-CM | POA: Diagnosis not present

## 2021-03-06 DIAGNOSIS — R451 Restlessness and agitation: Secondary | ICD-10-CM | POA: Diagnosis not present

## 2021-03-06 DIAGNOSIS — I129 Hypertensive chronic kidney disease with stage 1 through stage 4 chronic kidney disease, or unspecified chronic kidney disease: Secondary | ICD-10-CM | POA: Diagnosis not present

## 2021-03-06 DIAGNOSIS — Z0000 Encounter for general adult medical examination without abnormal findings: Secondary | ICD-10-CM | POA: Diagnosis not present

## 2021-03-06 DIAGNOSIS — E669 Obesity, unspecified: Secondary | ICD-10-CM | POA: Diagnosis not present

## 2021-03-06 DIAGNOSIS — I2510 Atherosclerotic heart disease of native coronary artery without angina pectoris: Secondary | ICD-10-CM | POA: Diagnosis not present

## 2021-03-06 DIAGNOSIS — J449 Chronic obstructive pulmonary disease, unspecified: Secondary | ICD-10-CM | POA: Diagnosis not present

## 2021-03-06 DIAGNOSIS — I6523 Occlusion and stenosis of bilateral carotid arteries: Secondary | ICD-10-CM | POA: Diagnosis not present

## 2021-03-06 DIAGNOSIS — D494 Neoplasm of unspecified behavior of bladder: Secondary | ICD-10-CM | POA: Diagnosis not present

## 2021-03-06 DIAGNOSIS — N1831 Chronic kidney disease, stage 3a: Secondary | ICD-10-CM | POA: Diagnosis not present

## 2021-03-06 DIAGNOSIS — C61 Malignant neoplasm of prostate: Secondary | ICD-10-CM | POA: Diagnosis not present

## 2021-03-26 DIAGNOSIS — L84 Corns and callosities: Secondary | ICD-10-CM | POA: Diagnosis not present

## 2021-03-26 DIAGNOSIS — B351 Tinea unguium: Secondary | ICD-10-CM | POA: Diagnosis not present

## 2021-03-26 DIAGNOSIS — E1142 Type 2 diabetes mellitus with diabetic polyneuropathy: Secondary | ICD-10-CM | POA: Diagnosis not present

## 2021-03-26 DIAGNOSIS — M79676 Pain in unspecified toe(s): Secondary | ICD-10-CM | POA: Diagnosis not present

## 2021-04-03 DIAGNOSIS — Z20822 Contact with and (suspected) exposure to covid-19: Secondary | ICD-10-CM | POA: Diagnosis not present

## 2021-04-17 DIAGNOSIS — Z20822 Contact with and (suspected) exposure to covid-19: Secondary | ICD-10-CM | POA: Diagnosis not present

## 2021-05-10 DIAGNOSIS — Z20822 Contact with and (suspected) exposure to covid-19: Secondary | ICD-10-CM | POA: Diagnosis not present

## 2021-06-11 DIAGNOSIS — E1142 Type 2 diabetes mellitus with diabetic polyneuropathy: Secondary | ICD-10-CM | POA: Diagnosis not present

## 2021-06-11 DIAGNOSIS — B351 Tinea unguium: Secondary | ICD-10-CM | POA: Diagnosis not present

## 2021-06-11 DIAGNOSIS — M79676 Pain in unspecified toe(s): Secondary | ICD-10-CM | POA: Diagnosis not present

## 2021-06-11 DIAGNOSIS — L84 Corns and callosities: Secondary | ICD-10-CM | POA: Diagnosis not present

## 2021-07-17 DIAGNOSIS — R634 Abnormal weight loss: Secondary | ICD-10-CM | POA: Diagnosis not present

## 2021-07-17 DIAGNOSIS — E1151 Type 2 diabetes mellitus with diabetic peripheral angiopathy without gangrene: Secondary | ICD-10-CM | POA: Diagnosis not present

## 2021-07-17 DIAGNOSIS — E785 Hyperlipidemia, unspecified: Secondary | ICD-10-CM | POA: Diagnosis not present

## 2021-07-17 DIAGNOSIS — D494 Neoplasm of unspecified behavior of bladder: Secondary | ICD-10-CM | POA: Diagnosis not present

## 2021-07-17 DIAGNOSIS — R451 Restlessness and agitation: Secondary | ICD-10-CM | POA: Diagnosis not present

## 2021-07-17 DIAGNOSIS — R195 Other fecal abnormalities: Secondary | ICD-10-CM | POA: Diagnosis not present

## 2021-07-17 DIAGNOSIS — I2510 Atherosclerotic heart disease of native coronary artery without angina pectoris: Secondary | ICD-10-CM | POA: Diagnosis not present

## 2021-07-17 DIAGNOSIS — J449 Chronic obstructive pulmonary disease, unspecified: Secondary | ICD-10-CM | POA: Diagnosis not present

## 2021-07-17 DIAGNOSIS — C61 Malignant neoplasm of prostate: Secondary | ICD-10-CM | POA: Diagnosis not present

## 2021-07-17 DIAGNOSIS — I129 Hypertensive chronic kidney disease with stage 1 through stage 4 chronic kidney disease, or unspecified chronic kidney disease: Secondary | ICD-10-CM | POA: Diagnosis not present

## 2021-07-17 DIAGNOSIS — N1831 Chronic kidney disease, stage 3a: Secondary | ICD-10-CM | POA: Diagnosis not present

## 2021-07-17 DIAGNOSIS — L989 Disorder of the skin and subcutaneous tissue, unspecified: Secondary | ICD-10-CM | POA: Diagnosis not present

## 2021-08-20 DIAGNOSIS — E1142 Type 2 diabetes mellitus with diabetic polyneuropathy: Secondary | ICD-10-CM | POA: Diagnosis not present

## 2021-08-20 DIAGNOSIS — L84 Corns and callosities: Secondary | ICD-10-CM | POA: Diagnosis not present

## 2021-08-20 DIAGNOSIS — M79676 Pain in unspecified toe(s): Secondary | ICD-10-CM | POA: Diagnosis not present

## 2021-08-20 DIAGNOSIS — B351 Tinea unguium: Secondary | ICD-10-CM | POA: Diagnosis not present

## 2021-09-04 DIAGNOSIS — H524 Presbyopia: Secondary | ICD-10-CM | POA: Diagnosis not present

## 2021-09-04 DIAGNOSIS — H2511 Age-related nuclear cataract, right eye: Secondary | ICD-10-CM | POA: Diagnosis not present

## 2021-09-04 DIAGNOSIS — H5203 Hypermetropia, bilateral: Secondary | ICD-10-CM | POA: Diagnosis not present

## 2021-09-04 DIAGNOSIS — H35033 Hypertensive retinopathy, bilateral: Secondary | ICD-10-CM | POA: Diagnosis not present

## 2021-09-04 DIAGNOSIS — H52223 Regular astigmatism, bilateral: Secondary | ICD-10-CM | POA: Diagnosis not present

## 2021-10-22 DIAGNOSIS — R52 Pain, unspecified: Secondary | ICD-10-CM | POA: Diagnosis not present

## 2021-10-24 DIAGNOSIS — M79642 Pain in left hand: Secondary | ICD-10-CM | POA: Diagnosis not present

## 2021-10-24 DIAGNOSIS — R296 Repeated falls: Secondary | ICD-10-CM | POA: Diagnosis not present

## 2021-10-24 DIAGNOSIS — M154 Erosive (osteo)arthritis: Secondary | ICD-10-CM | POA: Diagnosis not present

## 2021-10-24 DIAGNOSIS — R2681 Unsteadiness on feet: Secondary | ICD-10-CM | POA: Diagnosis not present

## 2021-10-24 DIAGNOSIS — M79641 Pain in right hand: Secondary | ICD-10-CM | POA: Diagnosis not present

## 2021-10-24 DIAGNOSIS — Z23 Encounter for immunization: Secondary | ICD-10-CM | POA: Diagnosis not present

## 2021-10-29 DIAGNOSIS — E1142 Type 2 diabetes mellitus with diabetic polyneuropathy: Secondary | ICD-10-CM | POA: Diagnosis not present

## 2021-10-29 DIAGNOSIS — M79676 Pain in unspecified toe(s): Secondary | ICD-10-CM | POA: Diagnosis not present

## 2021-10-29 DIAGNOSIS — B351 Tinea unguium: Secondary | ICD-10-CM | POA: Diagnosis not present

## 2021-10-29 DIAGNOSIS — L84 Corns and callosities: Secondary | ICD-10-CM | POA: Diagnosis not present

## 2021-12-04 DIAGNOSIS — N1831 Chronic kidney disease, stage 3a: Secondary | ICD-10-CM | POA: Diagnosis not present

## 2021-12-04 DIAGNOSIS — L989 Disorder of the skin and subcutaneous tissue, unspecified: Secondary | ICD-10-CM | POA: Diagnosis not present

## 2021-12-04 DIAGNOSIS — E1151 Type 2 diabetes mellitus with diabetic peripheral angiopathy without gangrene: Secondary | ICD-10-CM | POA: Diagnosis not present

## 2021-12-04 DIAGNOSIS — C61 Malignant neoplasm of prostate: Secondary | ICD-10-CM | POA: Diagnosis not present

## 2021-12-04 DIAGNOSIS — I6523 Occlusion and stenosis of bilateral carotid arteries: Secondary | ICD-10-CM | POA: Diagnosis not present

## 2021-12-04 DIAGNOSIS — J449 Chronic obstructive pulmonary disease, unspecified: Secondary | ICD-10-CM | POA: Diagnosis not present

## 2021-12-04 DIAGNOSIS — I2510 Atherosclerotic heart disease of native coronary artery without angina pectoris: Secondary | ICD-10-CM | POA: Diagnosis not present

## 2021-12-04 DIAGNOSIS — M154 Erosive (osteo)arthritis: Secondary | ICD-10-CM | POA: Diagnosis not present

## 2021-12-04 DIAGNOSIS — D494 Neoplasm of unspecified behavior of bladder: Secondary | ICD-10-CM | POA: Diagnosis not present

## 2021-12-04 DIAGNOSIS — I129 Hypertensive chronic kidney disease with stage 1 through stage 4 chronic kidney disease, or unspecified chronic kidney disease: Secondary | ICD-10-CM | POA: Diagnosis not present

## 2021-12-04 DIAGNOSIS — R296 Repeated falls: Secondary | ICD-10-CM | POA: Diagnosis not present

## 2021-12-04 DIAGNOSIS — E669 Obesity, unspecified: Secondary | ICD-10-CM | POA: Diagnosis not present

## 2022-01-07 DIAGNOSIS — L84 Corns and callosities: Secondary | ICD-10-CM | POA: Diagnosis not present

## 2022-01-07 DIAGNOSIS — E1142 Type 2 diabetes mellitus with diabetic polyneuropathy: Secondary | ICD-10-CM | POA: Diagnosis not present

## 2022-01-07 DIAGNOSIS — M79676 Pain in unspecified toe(s): Secondary | ICD-10-CM | POA: Diagnosis not present

## 2022-01-07 DIAGNOSIS — B351 Tinea unguium: Secondary | ICD-10-CM | POA: Diagnosis not present

## 2022-01-27 ENCOUNTER — Inpatient Hospital Stay (HOSPITAL_COMMUNITY)
Admission: EM | Admit: 2022-01-27 | Discharge: 2022-02-06 | DRG: 542 | Disposition: A | Discharge status: Skilled Nursing Facility | Payer: Medicare Other | Attending: Family Medicine | Admitting: Family Medicine

## 2022-01-27 ENCOUNTER — Encounter (HOSPITAL_COMMUNITY): Payer: Self-pay

## 2022-01-27 ENCOUNTER — Emergency Department (HOSPITAL_COMMUNITY): Payer: Medicare Other

## 2022-01-27 ENCOUNTER — Other Ambulatory Visit: Payer: Self-pay

## 2022-01-27 DIAGNOSIS — M6281 Muscle weakness (generalized): Secondary | ICD-10-CM | POA: Diagnosis not present

## 2022-01-27 DIAGNOSIS — M4802 Spinal stenosis, cervical region: Secondary | ICD-10-CM | POA: Diagnosis present

## 2022-01-27 DIAGNOSIS — Z794 Long term (current) use of insulin: Secondary | ICD-10-CM

## 2022-01-27 DIAGNOSIS — Z6830 Body mass index (BMI) 30.0-30.9, adult: Secondary | ICD-10-CM

## 2022-01-27 DIAGNOSIS — Z515 Encounter for palliative care: Secondary | ICD-10-CM | POA: Diagnosis not present

## 2022-01-27 DIAGNOSIS — Z8249 Family history of ischemic heart disease and other diseases of the circulatory system: Secondary | ICD-10-CM

## 2022-01-27 DIAGNOSIS — S32010D Wedge compression fracture of first lumbar vertebra, subsequent encounter for fracture with routine healing: Secondary | ICD-10-CM | POA: Diagnosis not present

## 2022-01-27 DIAGNOSIS — F17210 Nicotine dependence, cigarettes, uncomplicated: Secondary | ICD-10-CM | POA: Diagnosis present

## 2022-01-27 DIAGNOSIS — E876 Hypokalemia: Secondary | ICD-10-CM | POA: Diagnosis present

## 2022-01-27 DIAGNOSIS — Z4789 Encounter for other orthopedic aftercare: Secondary | ICD-10-CM | POA: Diagnosis not present

## 2022-01-27 DIAGNOSIS — R41841 Cognitive communication deficit: Secondary | ICD-10-CM | POA: Diagnosis not present

## 2022-01-27 DIAGNOSIS — M4856XA Collapsed vertebra, not elsewhere classified, lumbar region, initial encounter for fracture: Principal | ICD-10-CM | POA: Diagnosis present

## 2022-01-27 DIAGNOSIS — F09 Unspecified mental disorder due to known physiological condition: Secondary | ICD-10-CM | POA: Diagnosis not present

## 2022-01-27 DIAGNOSIS — I459 Conduction disorder, unspecified: Secondary | ICD-10-CM | POA: Diagnosis not present

## 2022-01-27 DIAGNOSIS — G9341 Metabolic encephalopathy: Secondary | ICD-10-CM | POA: Insufficient documentation

## 2022-01-27 DIAGNOSIS — J9811 Atelectasis: Secondary | ICD-10-CM | POA: Diagnosis not present

## 2022-01-27 DIAGNOSIS — K529 Noninfective gastroenteritis and colitis, unspecified: Secondary | ICD-10-CM | POA: Diagnosis not present

## 2022-01-27 DIAGNOSIS — R55 Syncope and collapse: Secondary | ICD-10-CM

## 2022-01-27 DIAGNOSIS — E1122 Type 2 diabetes mellitus with diabetic chronic kidney disease: Secondary | ICD-10-CM | POA: Diagnosis present

## 2022-01-27 DIAGNOSIS — E785 Hyperlipidemia, unspecified: Secondary | ICD-10-CM | POA: Diagnosis not present

## 2022-01-27 DIAGNOSIS — N1832 Acute kidney failure, unspecified: Secondary | ICD-10-CM | POA: Diagnosis present

## 2022-01-27 DIAGNOSIS — Z85828 Personal history of other malignant neoplasm of skin: Secondary | ICD-10-CM

## 2022-01-27 DIAGNOSIS — Z8546 Personal history of malignant neoplasm of prostate: Secondary | ICD-10-CM | POA: Diagnosis not present

## 2022-01-27 DIAGNOSIS — Z79899 Other long term (current) drug therapy: Secondary | ICD-10-CM | POA: Diagnosis not present

## 2022-01-27 DIAGNOSIS — E669 Obesity, unspecified: Secondary | ICD-10-CM | POA: Diagnosis present

## 2022-01-27 DIAGNOSIS — W19XXXA Unspecified fall, initial encounter: Secondary | ICD-10-CM | POA: Diagnosis not present

## 2022-01-27 DIAGNOSIS — K6289 Other specified diseases of anus and rectum: Secondary | ICD-10-CM | POA: Diagnosis not present

## 2022-01-27 DIAGNOSIS — M545 Low back pain, unspecified: Secondary | ICD-10-CM | POA: Diagnosis not present

## 2022-01-27 DIAGNOSIS — E875 Hyperkalemia: Secondary | ICD-10-CM | POA: Diagnosis not present

## 2022-01-27 DIAGNOSIS — K219 Gastro-esophageal reflux disease without esophagitis: Secondary | ICD-10-CM | POA: Insufficient documentation

## 2022-01-27 DIAGNOSIS — E782 Mixed hyperlipidemia: Secondary | ICD-10-CM | POA: Diagnosis present

## 2022-01-27 DIAGNOSIS — I2510 Atherosclerotic heart disease of native coronary artery without angina pectoris: Secondary | ICD-10-CM | POA: Diagnosis not present

## 2022-01-27 DIAGNOSIS — Z8711 Personal history of peptic ulcer disease: Secondary | ICD-10-CM

## 2022-01-27 DIAGNOSIS — M8588 Other specified disorders of bone density and structure, other site: Secondary | ICD-10-CM | POA: Diagnosis not present

## 2022-01-27 DIAGNOSIS — J90 Pleural effusion, not elsewhere classified: Secondary | ICD-10-CM | POA: Diagnosis not present

## 2022-01-27 DIAGNOSIS — J449 Chronic obstructive pulmonary disease, unspecified: Secondary | ICD-10-CM | POA: Diagnosis present

## 2022-01-27 DIAGNOSIS — I6521 Occlusion and stenosis of right carotid artery: Secondary | ICD-10-CM | POA: Diagnosis not present

## 2022-01-27 DIAGNOSIS — I77810 Thoracic aortic ectasia: Secondary | ICD-10-CM | POA: Diagnosis present

## 2022-01-27 DIAGNOSIS — R296 Repeated falls: Secondary | ICD-10-CM | POA: Diagnosis present

## 2022-01-27 DIAGNOSIS — I6529 Occlusion and stenosis of unspecified carotid artery: Secondary | ICD-10-CM | POA: Insufficient documentation

## 2022-01-27 DIAGNOSIS — M48061 Spinal stenosis, lumbar region without neurogenic claudication: Secondary | ICD-10-CM | POA: Diagnosis not present

## 2022-01-27 DIAGNOSIS — E119 Type 2 diabetes mellitus without complications: Secondary | ICD-10-CM | POA: Diagnosis not present

## 2022-01-27 DIAGNOSIS — S32010A Wedge compression fracture of first lumbar vertebra, initial encounter for closed fracture: Secondary | ICD-10-CM | POA: Insufficient documentation

## 2022-01-27 DIAGNOSIS — N179 Acute kidney failure, unspecified: Secondary | ICD-10-CM | POA: Diagnosis present

## 2022-01-27 DIAGNOSIS — Z823 Family history of stroke: Secondary | ICD-10-CM

## 2022-01-27 DIAGNOSIS — R410 Disorientation, unspecified: Secondary | ICD-10-CM | POA: Diagnosis not present

## 2022-01-27 DIAGNOSIS — I129 Hypertensive chronic kidney disease with stage 1 through stage 4 chronic kidney disease, or unspecified chronic kidney disease: Secondary | ICD-10-CM | POA: Diagnosis present

## 2022-01-27 DIAGNOSIS — G9520 Unspecified cord compression: Secondary | ICD-10-CM | POA: Diagnosis not present

## 2022-01-27 DIAGNOSIS — G9340 Encephalopathy, unspecified: Secondary | ICD-10-CM | POA: Diagnosis not present

## 2022-01-27 DIAGNOSIS — F05 Delirium due to known physiological condition: Secondary | ICD-10-CM | POA: Diagnosis not present

## 2022-01-27 DIAGNOSIS — D649 Anemia, unspecified: Secondary | ICD-10-CM | POA: Diagnosis not present

## 2022-01-27 DIAGNOSIS — M4316 Spondylolisthesis, lumbar region: Secondary | ICD-10-CM | POA: Diagnosis not present

## 2022-01-27 DIAGNOSIS — I4892 Unspecified atrial flutter: Secondary | ICD-10-CM | POA: Diagnosis present

## 2022-01-27 DIAGNOSIS — R159 Full incontinence of feces: Secondary | ICD-10-CM | POA: Diagnosis present

## 2022-01-27 DIAGNOSIS — Z7902 Long term (current) use of antithrombotics/antiplatelets: Secondary | ICD-10-CM

## 2022-01-27 DIAGNOSIS — W19XXXD Unspecified fall, subsequent encounter: Secondary | ICD-10-CM | POA: Diagnosis not present

## 2022-01-27 DIAGNOSIS — S199XXA Unspecified injury of neck, initial encounter: Secondary | ICD-10-CM | POA: Diagnosis not present

## 2022-01-27 DIAGNOSIS — R262 Difficulty in walking, not elsewhere classified: Secondary | ICD-10-CM | POA: Diagnosis not present

## 2022-01-27 DIAGNOSIS — M549 Dorsalgia, unspecified: Secondary | ICD-10-CM | POA: Diagnosis not present

## 2022-01-27 DIAGNOSIS — R531 Weakness: Secondary | ICD-10-CM | POA: Diagnosis not present

## 2022-01-27 DIAGNOSIS — Z66 Do not resuscitate: Secondary | ICD-10-CM | POA: Diagnosis present

## 2022-01-27 DIAGNOSIS — R32 Unspecified urinary incontinence: Secondary | ICD-10-CM | POA: Diagnosis present

## 2022-01-27 DIAGNOSIS — R911 Solitary pulmonary nodule: Secondary | ICD-10-CM | POA: Diagnosis present

## 2022-01-27 DIAGNOSIS — I10 Essential (primary) hypertension: Secondary | ICD-10-CM | POA: Diagnosis present

## 2022-01-27 DIAGNOSIS — I359 Nonrheumatic aortic valve disorder, unspecified: Secondary | ICD-10-CM | POA: Diagnosis not present

## 2022-01-27 DIAGNOSIS — I441 Atrioventricular block, second degree: Secondary | ICD-10-CM | POA: Diagnosis not present

## 2022-01-27 DIAGNOSIS — Z7401 Bed confinement status: Secondary | ICD-10-CM | POA: Diagnosis not present

## 2022-01-27 DIAGNOSIS — I771 Stricture of artery: Secondary | ICD-10-CM | POA: Diagnosis not present

## 2022-01-27 DIAGNOSIS — Z7409 Other reduced mobility: Secondary | ICD-10-CM | POA: Diagnosis not present

## 2022-01-27 DIAGNOSIS — I25119 Atherosclerotic heart disease of native coronary artery with unspecified angina pectoris: Secondary | ICD-10-CM | POA: Diagnosis not present

## 2022-01-27 DIAGNOSIS — Z955 Presence of coronary angioplasty implant and graft: Secondary | ICD-10-CM

## 2022-01-27 DIAGNOSIS — G4730 Sleep apnea, unspecified: Secondary | ICD-10-CM | POA: Diagnosis present

## 2022-01-27 DIAGNOSIS — Z9181 History of falling: Secondary | ICD-10-CM | POA: Diagnosis not present

## 2022-01-27 DIAGNOSIS — N200 Calculus of kidney: Secondary | ICD-10-CM | POA: Diagnosis not present

## 2022-01-27 DIAGNOSIS — J8410 Pulmonary fibrosis, unspecified: Secondary | ICD-10-CM | POA: Diagnosis not present

## 2022-01-27 DIAGNOSIS — I6523 Occlusion and stenosis of bilateral carotid arteries: Secondary | ICD-10-CM | POA: Diagnosis not present

## 2022-01-27 DIAGNOSIS — M79651 Pain in right thigh: Secondary | ICD-10-CM | POA: Diagnosis not present

## 2022-01-27 DIAGNOSIS — R339 Retention of urine, unspecified: Secondary | ICD-10-CM | POA: Diagnosis present

## 2022-01-27 DIAGNOSIS — I959 Hypotension, unspecified: Secondary | ICD-10-CM | POA: Diagnosis not present

## 2022-01-27 DIAGNOSIS — I252 Old myocardial infarction: Secondary | ICD-10-CM

## 2022-01-27 DIAGNOSIS — G319 Degenerative disease of nervous system, unspecified: Secondary | ICD-10-CM | POA: Diagnosis not present

## 2022-01-27 DIAGNOSIS — Z809 Family history of malignant neoplasm, unspecified: Secondary | ICD-10-CM

## 2022-01-27 DIAGNOSIS — Z720 Tobacco use: Secondary | ICD-10-CM | POA: Diagnosis not present

## 2022-01-27 DIAGNOSIS — S0990XA Unspecified injury of head, initial encounter: Secondary | ICD-10-CM | POA: Diagnosis not present

## 2022-01-27 HISTORY — DX: Type 2 diabetes mellitus without complications: E11.9

## 2022-01-27 LAB — COMPREHENSIVE METABOLIC PANEL
ALT: 12 U/L (ref 0–44)
AST: 17 U/L (ref 15–41)
Albumin: 3.6 g/dL (ref 3.5–5.0)
Alkaline Phosphatase: 61 U/L (ref 38–126)
Anion gap: 12 (ref 5–15)
BUN: 59 mg/dL — ABNORMAL HIGH (ref 8–23)
CO2: 22 mmol/L (ref 22–32)
Calcium: 8.9 mg/dL (ref 8.9–10.3)
Chloride: 101 mmol/L (ref 98–111)
Creatinine, Ser: 2.31 mg/dL — ABNORMAL HIGH (ref 0.61–1.24)
GFR, Estimated: 27 mL/min — ABNORMAL LOW (ref >60)
Glucose, Bld: 131 mg/dL — ABNORMAL HIGH (ref 70–99)
Potassium: 3.4 mmol/L — ABNORMAL LOW (ref 3.5–5.1)
Sodium: 135 mmol/L (ref 135–145)
Total Bilirubin: 0.7 mg/dL (ref 0.3–1.2)
Total Protein: 6.9 g/dL (ref 6.5–8.1)

## 2022-01-27 LAB — URINALYSIS, ROUTINE W REFLEX MICROSCOPIC
Bacteria, UA: NONE SEEN
Bilirubin Urine: NEGATIVE
Glucose, UA: NEGATIVE mg/dL
Ketones, ur: NEGATIVE mg/dL
Leukocytes,Ua: NEGATIVE
Nitrite: NEGATIVE
Protein, ur: 100 mg/dL — AB
Specific Gravity, Urine: 1.017 (ref 1.005–1.030)
pH: 5 (ref 5.0–8.0)

## 2022-01-27 LAB — CBC
HCT: 41 % (ref 39.0–52.0)
Hemoglobin: 13.5 g/dL (ref 13.0–17.0)
MCH: 30.5 pg (ref 26.0–34.0)
MCHC: 32.9 g/dL (ref 30.0–36.0)
MCV: 92.6 fL (ref 80.0–100.0)
Platelets: 207 10*3/uL (ref 150–400)
RBC: 4.43 MIL/uL (ref 4.22–5.81)
RDW: 13.4 % (ref 11.5–15.5)
WBC: 17.6 10*3/uL — ABNORMAL HIGH (ref 4.0–10.5)
nRBC: 0 % (ref 0.0–0.2)

## 2022-01-27 LAB — MAGNESIUM: Magnesium: 1.8 mg/dL (ref 1.7–2.4)

## 2022-01-27 LAB — PROTIME-INR
INR: 1.1 (ref 0.8–1.2)
Prothrombin Time: 13.8 seconds (ref 11.4–15.2)

## 2022-01-27 LAB — CK: Total CK: 97 U/L (ref 49–397)

## 2022-01-27 LAB — LACTIC ACID, PLASMA: Lactic Acid, Venous: 1.3 mmol/L (ref 0.5–1.9)

## 2022-01-27 MED ORDER — OXYCODONE-ACETAMINOPHEN 5-325 MG PO TABS
1.0000 | ORAL_TABLET | Freq: Once | ORAL | Status: AC
  Administered 2022-01-27: 1 via ORAL
  Filled 2022-01-27: qty 1
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
## 2022-01-27 NOTE — ED Notes (Addendum)
Patient transported to CT/xray 

## 2022-01-27 NOTE — ED Provider Notes (Signed)
[facility] Provider Note   CSN: [id number] Arrival date & time: 01/27/22  1813     History  Chief Complaint  Patient presents with   Fall    Back pain    [name] [name] is a 89 y.o. male.   Fall Associated symptoms include abdominal pain.  Patient presents after a fall.  Fall occurred this morning.  When he fell, he fell backwards.  He does believe that he passed out.  He has had pain in his lower back and coccyx area since that time.  He had an additional fall 1 week ago.  Since that fall 1 week ago, he has had pain in the area of his right hip.  Pain is worsened with weightbearing.  He has had poor mobility since that time.  Today, he has not been able to stand or walk.  He also endorses new pain in the right side of his abdomen.  Patient currently lives at home with his wife, who is on hospice care.  He has 4 children who alternate and coming by to help with his care.  During today's fall, he did strike his head.  Per daughter, who accompanies him at bedside, he has had some mild confusion over the past week.  Pain at home has been treated with Tylenol and Aleve only.  He is not on blood thinners.  Medical history includes T2DM, HLD, CAD, sleep apnea, COPD, HTN, PUD.       Home Medications Prior to Admission medications   Medication Sig Start Date End Date Taking? Authorizing Provider  cholestyramine (QUESTRAN) 4 g packet Take 1 packet by mouth 2 (two) times daily. 11/08/21  Yes [provider]  escitalopram (LEXAPRO) 10 MG tablet Take 10 mg by mouth daily. 12/02/21  Yes [provider]  amLODipine (NORVASC) 5 MG tablet Take 5 mg by mouth daily.  11/12/17   [provider]  cephALEXin (KEFLEX) 500 MG capsule Take 1 capsule (500 mg total) by mouth 2 (two) times daily. 05/13/18   Franchot Gallo, MD  cholestyramine light (PREVALITE) 4 G packet Take 4 g by mouth daily as needed.     [provider]   clopidogrel (PLAVIX) 75 MG tablet Take 1 tablet (75 mg total) by mouth daily. 01/31/14   Minus Breeding, MD  Cyanocobalamin (VITAMIN B 12 PO) Take 1 tablet by mouth daily.    [provider]  diphenhydramine-acetaminophen (TYLENOL PM) 25-500 MG TABS tablet Take 1 tablet by mouth at bedtime as needed.    [provider]  ferrous sulfate 325 (65 FE) MG EC tablet Take 325 mg by mouth daily with breakfast.     [provider]  gabapentin (NEURONTIN) 100 MG capsule Take 1 capsule by mouth at bedtime as needed (leg pain).  11/12/17   [provider]  ibuprofen (ADVIL,MOTRIN) 200 MG tablet Take 400 mg by mouth every 6 (six) hours as needed (pain).     [provider]  irbesartan-hydrochlorothiazide (AVALIDE) 300-12.5 MG per tablet Take 1 tablet by mouth daily. 01/31/14   Minus Breeding, MD  LANTUS SOLOSTAR 100 UNIT/ML Solostar Pen Inject 34-36 Units into the skin at bedtime. 12/01/17   [provider]  metoprolol succinate (TOPROL-XL) 25 MG 24 hr tablet TAKE 1 TABLET BY MOUTH TWICE DAILY Patient taking differently: Take 25 mg by mouth daily. TAKE 1 TABLET BY MOUTH TWICE DAILY 01/31/14   Minus Breeding, MD  omeprazole (PRILOSEC) 20 MG  capsule Take 20 mg by mouth daily.      [provider]  rosuvastatin (CRESTOR) 5 MG tablet Take 5 mg by mouth daily.  11/12/17   [provider]  TRADJENTA 5 MG TABS tablet Take 5 mg by mouth daily.  11/24/17   [provider]      Allergies    Patient has no known allergies.    Review of Systems   Review of Systems  Gastrointestinal:  Positive for abdominal pain.  Musculoskeletal:  Positive for arthralgias, back pain and gait problem.  Neurological:  Positive for syncope.  Psychiatric/Behavioral:  Positive for confusion.   All other systems reviewed and are negative.   Physical Exam Updated Vital Signs BP (!) 127/53   Pulse (!) 54   Temp 98.1 F (36.7 C) (Oral)   Resp (!) 21    Ht '5\' 3"'$  (1.6 m)   Wt 74.8 kg   SpO2 95%   BMI 29.23 kg/m  Physical Exam Vitals and nursing note reviewed.  Constitutional:      General: He is not in acute distress.    Appearance: Normal appearance. He is well-developed. He is not toxic-appearing or diaphoretic.  HENT:     Head: Normocephalic.     Comments: Abrasion to vertex of scalp    Right Ear: External ear normal.     Left Ear: External ear normal.     Nose: Nose normal.     Mouth/Throat:     Mouth: Mucous membranes are moist.  Eyes:     Extraocular Movements: Extraocular movements intact.     Conjunctiva/sclera: Conjunctivae normal.  Cardiovascular:     Rate and Rhythm: Normal rate and regular rhythm.     Heart sounds: No murmur heard. Pulmonary:     Effort: Pulmonary effort is normal. No respiratory distress.     Breath sounds: Normal breath sounds. No wheezing or rales.  Abdominal:     General: There is no distension.     Palpations: Abdomen is soft.     Tenderness: There is abdominal tenderness. There is no guarding or rebound.  Musculoskeletal:        General: Tenderness (Right proximal leg) present. No swelling.     Cervical back: Normal range of motion and neck supple.     Right lower leg: No edema.     Left lower leg: No edema.  Skin:    General: Skin is warm and dry.     Capillary Refill: Capillary refill takes less than 2 seconds.     Coloration: Skin is not jaundiced or pale.  Neurological:     General: No focal deficit present.     Mental Status: He is alert and oriented to person, place, and time.     Cranial Nerves: No cranial nerve deficit.     Sensory: No sensory deficit.     Motor: No weakness.     Coordination: Coordination normal.  Psychiatric:        Mood and Affect: Mood normal.        Behavior: Behavior normal.        Thought Content: Thought content normal.        Judgment: Judgment normal.     ED Results / Procedures / Treatments   Labs (all labs ordered are listed, but only  abnormal results are displayed) Labs Reviewed  COMPREHENSIVE METABOLIC PANEL - Abnormal; Notable for the following components:      Result Value   Potassium 3.4 (*)  Glucose, Bld 131 (*)    BUN 59 (*)    Creatinine, Ser 2.31 (*)    GFR, Estimated 27 (*)    All other components within normal limits  CBC - Abnormal; Notable for the following components:   WBC 17.6 (*)    All other components within normal limits  URINALYSIS, ROUTINE W REFLEX MICROSCOPIC - Abnormal; Notable for the following components:   APPearance HAZY (*)    Hgb urine dipstick MODERATE (*)    Protein, ur 100 (*)    All other components within normal limits  LACTIC ACID, PLASMA  PROTIME-INR  MAGNESIUM  CK    EKG None  Radiology CT L-SPINE NO CHARGE  Result Date: 01/27/2022 CLINICAL DATA:  Fall with back pain. EXAM: CT Thoracic and Lumbar spine without contrast TECHNIQUE: Multidetector CT imaging of the thoracic and lumbar spine was performed without intravenous contrast. Multiplanar CT image reconstructions were also generated. RADIATION DOSE REDUCTION: This exam was performed according to the departmental dose-optimization program which includes automated exposure control, adjustment of the mA and/or kV according to patient size and/or use of iterative reconstruction technique. CONTRAST:  None. COMPARISON:  Last AP and lateral chest was 01/02/2011, last CT abdomen and pelvis with reconstructions was 02/21/2018. FINDINGS: CT THORACIC SPINE FINDINGS Segmentation: There are 12 rib-bearing thoracic type segments. Alignment: There is mild thoracic kyphodextroscoliosis without AP listhesis. Vertebrae: There is osteopenia without evidence of acute compression fracture. There is intact right anterolateral bridging enthesopathy multiple levels starting at T5 entering at T11. Spondylosis at all levels. No fracture or primary or pathologic process is seen apart from osteopenia. Paraspinal and other soft tissues: No acute  findings. No fracture of the visible posterior ribs is seen. Disc levels: The discs are partially degenerated and middle 1/3 of the thoracic spine. The greatest disc space loss, with vacuum phenomenon noted at T7-8. There is a posterior disc osteophyte complex at this level partially effacing the ventral CSF but no frank cord compression is evident. Without contrast no significant soft tissue or bony encroachment on the thecal sac is seen elsewhere. Hypertrophic facets impress on the dorsolateral thecal sac at T9-10 and T10-11 but do not compress the cord as far as seen. The foramina are moderately stenotic at T7-8, left-greater-than-right but are not stenotic at the remaining thoracic levels despite mild facet spurs. CT LUMBAR SPINE FINDINGS Segmentation: 5 lumbar type vertebrae. Alignment: There is a grade 1 degenerative spondylolisthesis at L4-5, and a minimal chronic L5-S1 degenerative retrolisthesis otherwise normal alignment, with anterolisthesis due to advanced facet hypertrophy at L4-5. Vertebrae: There is osteopenia. There is acute upper plate anterior wedge compression fracture deformity of the L1 vertebral body with nondisplaced fragmentation along the anterior upper plate, anterior vertebral height loss 30-40%, posterior height loss 20-25%, and retropulsion of the posterosuperior cortex up to 4 mm mildly effacing the ventral CSF. A linear transverse fracture line is noted underlying the compressed L1 upper plate. The pedicles and posterior elements are intact. There is no other evidence of fractures. Paraspinal and other soft tissues: The aorta, iliac arteries and visceral branch arteries are heavily calcified. There is no AAA. There is no paraspinal hematoma. There is some paraspinal edema noted alongside L1. Disc levels: There is mild flattening of the ventral thecal sac just below the level of T12-L1 due to the posterosuperior L1 cortical retropulsion. There is no significant spinal canal stenosis.  There is a mild nonstenosing posterior disc bulge at T12-L1. Normal disc height. The foramina are clear.  L1-2: Moderate disc space loss. Bidirectional osteophytes are noted with mild spinal stenosis a posterior disc osteophyte complex. There is right-greater-than-left facet hypertrophy with severe right and moderate left foraminal stenosis. L2-3: The disc is normal in height. There are mild endplate spurs. There is mild spinal canal stenosis due to a diffuse disc bulge, ligamentous and facet hypertrophy. Mild-to-moderate right and mild left foraminal stenosis. L3-4: This disc is normal in height. There are small endplate spurs. Similar to L2 there is mild spinal canal stenosis due to dorsal ligamentous and facet hypertrophy and a broad posterior disc bulge. There is mild foraminal stenosis. L4-5: There is mild disc space loss. Diffuse annular bulge is seen. There are small endplate osteophytes. There is severe spinal canal stenosis due to advanced facet hypertrophy and dorsal ligamentous thickening. Vertical foraminal stenosis is seen with bilateral moderate to severe foraminal stenosis. L5-S1: The disc is normal in height. Broad posterior disc bulge is seen mildly compressing the S1 nerve roots. There is mild spinal canal stenosis. There is mild facet hypertrophy with mild foraminal stenosis. Other: The SI joints are patent. No sacral insufficiency fracture seen. There is spurring of both anterior SI joints. IMPRESSION: 1. Osteopenia and degenerative change of the thoracic spine without evidence of thoracic spine fractures. 2. Mild thoracic kyphodextroscoliosis. 3. Lumbar spine CT: Acute upper plate anterior wedge compression fracture of the L1 vertebral body with nondisplaced fragmentation along the anterior upper plate, 30-40% anterior vertebral height loss, 20-25% posterior height loss, and 4 mm retropulsion of the posterosuperior cortex without significant spinal canal stenosis. 4. Osteopenia and degenerative  change without evidence of further lumbar spine fractures. 5. Severe acquired spinal canal stenosis at L4-5 with mild spinal canal stenosis at L2-3 and L3-4 and L5-S1. 6. Multilevel lumbar foraminal stenosis greatest on the right at L1-2 and bilaterally at L4-5. 7. Aortic and branch vessel atherosclerosis. Aortic Atherosclerosis (ICD10-I70.0). Electronically Signed   By: Telford Nab M.D.   On: 01/27/2022 23:58   CT T-SPINE NO CHARGE  Result Date: 01/27/2022 CLINICAL DATA:  Fall with back pain. EXAM: CT Thoracic and Lumbar spine without contrast TECHNIQUE: Multidetector CT imaging of the thoracic and lumbar spine was performed without intravenous contrast. Multiplanar CT image reconstructions were also generated. RADIATION DOSE REDUCTION: This exam was performed according to the departmental dose-optimization program which includes automated exposure control, adjustment of the mA and/or kV according to patient size and/or use of iterative reconstruction technique. CONTRAST:  None. COMPARISON:  Last AP and lateral chest was 01/02/2011, last CT abdomen and pelvis with reconstructions was 02/21/2018. FINDINGS: CT THORACIC SPINE FINDINGS Segmentation: There are 12 rib-bearing thoracic type segments. Alignment: There is mild thoracic kyphodextroscoliosis without AP listhesis. Vertebrae: There is osteopenia without evidence of acute compression fracture. There is intact right anterolateral bridging enthesopathy multiple levels starting at T5 entering at T11. Spondylosis at all levels. No fracture or primary or pathologic process is seen apart from osteopenia. Paraspinal and other soft tissues: No acute findings. No fracture of the visible posterior ribs is seen. Disc levels: The discs are partially degenerated and middle 1/3 of the thoracic spine. The greatest disc space loss, with vacuum phenomenon noted at T7-8. There is a posterior disc osteophyte complex at this level partially effacing the ventral CSF but no  frank cord compression is evident. Without contrast no significant soft tissue or bony encroachment on the thecal sac is seen elsewhere. Hypertrophic facets impress on the dorsolateral thecal sac at T9-10 and T10-11 but do not compress  the cord as far as seen. The foramina are moderately stenotic at T7-8, left-greater-than-right but are not stenotic at the remaining thoracic levels despite mild facet spurs. CT LUMBAR SPINE FINDINGS Segmentation: 5 lumbar type vertebrae. Alignment: There is a grade 1 degenerative spondylolisthesis at L4-5, and a minimal chronic L5-S1 degenerative retrolisthesis otherwise normal alignment, with anterolisthesis due to advanced facet hypertrophy at L4-5. Vertebrae: There is osteopenia. There is acute upper plate anterior wedge compression fracture deformity of the L1 vertebral body with nondisplaced fragmentation along the anterior upper plate, anterior vertebral height loss 30-40%, posterior height loss 20-25%, and retropulsion of the posterosuperior cortex up to 4 mm mildly effacing the ventral CSF. A linear transverse fracture line is noted underlying the compressed L1 upper plate. The pedicles and posterior elements are intact. There is no other evidence of fractures. Paraspinal and other soft tissues: The aorta, iliac arteries and visceral branch arteries are heavily calcified. There is no AAA. There is no paraspinal hematoma. There is some paraspinal edema noted alongside L1. Disc levels: There is mild flattening of the ventral thecal sac just below the level of T12-L1 due to the posterosuperior L1 cortical retropulsion. There is no significant spinal canal stenosis. There is a mild nonstenosing posterior disc bulge at T12-L1. Normal disc height. The foramina are clear. L1-2: Moderate disc space loss. Bidirectional osteophytes are noted with mild spinal stenosis a posterior disc osteophyte complex. There is right-greater-than-left facet hypertrophy with severe right and moderate  left foraminal stenosis. L2-3: The disc is normal in height. There are mild endplate spurs. There is mild spinal canal stenosis due to a diffuse disc bulge, ligamentous and facet hypertrophy. Mild-to-moderate right and mild left foraminal stenosis. L3-4: This disc is normal in height. There are small endplate spurs. Similar to L2 there is mild spinal canal stenosis due to dorsal ligamentous and facet hypertrophy and a broad posterior disc bulge. There is mild foraminal stenosis. L4-5: There is mild disc space loss. Diffuse annular bulge is seen. There are small endplate osteophytes. There is severe spinal canal stenosis due to advanced facet hypertrophy and dorsal ligamentous thickening. Vertical foraminal stenosis is seen with bilateral moderate to severe foraminal stenosis. L5-S1: The disc is normal in height. Broad posterior disc bulge is seen mildly compressing the S1 nerve roots. There is mild spinal canal stenosis. There is mild facet hypertrophy with mild foraminal stenosis. Other: The SI joints are patent. No sacral insufficiency fracture seen. There is spurring of both anterior SI joints. IMPRESSION: 1. Osteopenia and degenerative change of the thoracic spine without evidence of thoracic spine fractures. 2. Mild thoracic kyphodextroscoliosis. 3. Lumbar spine CT: Acute upper plate anterior wedge compression fracture of the L1 vertebral body with nondisplaced fragmentation along the anterior upper plate, 30-40% anterior vertebral height loss, 20-25% posterior height loss, and 4 mm retropulsion of the posterosuperior cortex without significant spinal canal stenosis. 4. Osteopenia and degenerative change without evidence of further lumbar spine fractures. 5. Severe acquired spinal canal stenosis at L4-5 with mild spinal canal stenosis at L2-3 and L3-4 and L5-S1. 6. Multilevel lumbar foraminal stenosis greatest on the right at L1-2 and bilaterally at L4-5. 7. Aortic and branch vessel atherosclerosis. Aortic  Atherosclerosis (ICD10-I70.0). Electronically Signed   By: Telford Nab M.D.   On: 01/27/2022 23:58   CT CHEST ABDOMEN PELVIS WO CONTRAST  Result Date: 01/27/2022 CLINICAL DATA:  Frequent falls, most recently today. EXAM: CT CHEST, ABDOMEN AND PELVIS WITHOUT CONTRAST TECHNIQUE: Multidetector CT imaging of the chest, abdomen and  pelvis was performed following the standard protocol without IV contrast. RADIATION DOSE REDUCTION: This exam was performed according to the departmental dose-optimization program which includes automated exposure control, adjustment of the mA and/or kV according to patient size and/or use of iterative reconstruction technique. COMPARISON:  CT abdomen and pelvis without contrast 02/21/2018, CT abdomen and pelvis 11/21/2017 with contrast. No prior chest CT. Nutrition chest x-ray was PA Lat 01/02/2011. FINDINGS: CT CHEST FINDINGS Cardiovascular: The heart is slightly enlarged. Coronary arteries are heavily calcified. There are patchy calcifications in the aorta with tortuosity in the descending segment and mild dilatation in the aortic root at the sinuses of Valsalva and in the ascending segment both of which measure 4.2 cm. The pulmonary trunk is prominent at 3.2 cm indicating arterial hypertension and there is a small pericardial effusion. The pulmonary veins are decompressed. There are scattered calcifications of the great vessels. Mediastinum/Nodes: No enlarged mediastinal, hilar, or axillary lymph nodes. Thyroid gland, trachea, and esophagus demonstrate no significant findings. There is a small amount of scattered retained secretions in the trachea. The main bronchi are clear. Lungs/Pleura: There are bilateral trace pleural effusions. There is no pneumothorax. No pulmonary contusion or confluent airspace infiltrate is seen. There is diffuse bronchial thickening without visible bronchial plugging. There are a few small scattered thin walled air cysts on the right, scattered linear  atelectasis in the posterior basal lower lobes, lingular base. Anteriorly in the left upper lobe lingula there is a subpleural ground-glass nodule on 4:69 measuring 1 cm. In the left lower lobe just posterior to the crest of the diaphragm there is a 7 mm noncalcified nodule on 4:90. Medially in the right middle lobe, there is a second nodule also measuring 7 mm on 4:74. Rest of the lung fields are clear, with no other visible nodules. Musculoskeletal: No thoracic spinal compression injury is seen. There is intact anterior bridging enthesopathy in the mid and lower thirds of the thoracic spine. There are multilevel healed fracture deformities of the left rib cage. No displaced acute rib fracture seen. Generalized osteopenia. The chest wall is unremarkable. CT ABDOMEN PELVIS FINDINGS Hepatobiliary: There is a calcified granuloma in the right lobe, 1 in the left lobe. No other focal liver abnormality is seen without contrast. Gallbladder is absent, with no biliary dilatation. Pancreas: Partially atrophic and otherwise unremarkable without contrast. Spleen: There are calcified granulomas. Chronic capsular retraction posteriorly suggesting scarring. No other focal splenic abnormality. No splenomegaly. Adrenals/Urinary Tract: No adrenal or renal mass or hemorrhage is seen without contrast. Small cyst in the inferior pole of the left kidney are unchanged. Mild generalized adrenal hyperplasia appears similar. There are small scattered bilateral intrarenal caliceal stones and additional calcifications consistent with renovascular linear calcifications at both renal hila. No ureteral stones or hydronephrosis are seen. Bilateral perinephric stranding appears similar. Both kidneys are low normal in size. No bladder thickening is seen. Stomach/Bowel: Moderate thickened folds proximal to mid stomach. Mild fluid filling of some of the upper abdominal normal caliber small bowel seen possibly reflecting nonspecific enteritis. No  small bowel obstruction or inflammation is seen. The appendix is normal. There is mild to moderate retained stool in the ascending and transverse colon, left-sided diverticula most advanced in the sigmoid segment. No findings of acute diverticulitis are seen. The rectal wall is moderately thickened which could be due to proctitis, congestive or due to infiltrating disease. Further evaluation recommended. Vascular/Lymphatic: There is extensive aortoiliac and branch vessel atherosclerosis without AAA. No adenopathy is seen. Reproductive: No  prostatomegaly. Other: Minimal presacral pelvic ascites and trace ascites distal left paracolic gutter. No free air, free hemorrhage or incarcerated hernias. Bilateral small inguinal fat hernias. Musculoskeletal: There is an acute superior endplate compression fracture of the L1 vertebral body. Loss of anterior height is 30-40%, loss of posterior height 20-25% with 4 mm posterosuperior cortical retropulsion and mild nondisplaced fragmentation along the inferior upper plate. No other compression fractures are seen. There is osteopenia, degenerative changes of the spine and advanced L4-5 facet hypertrophy with grade 1 degenerative L4-5 spondylolisthesis. Mild hip DJD. IMPRESSION: 1. Acute superior endplate compression fracture of the L1 vertebral body with 4 mm posterosuperior cortical retropulsion. 2. No other acute trauma related findings are seen in the chest, abdomen or pelvis. 3. Trace pleural effusions. 4. 1 cm subpleural ground-glass nodule in the left upper lobe lingula, and 7 mm noncalcified nodules in the right middle lobe and left lower lobe. Initial follow-up with CT at 6 months is recommended to confirm persistence of the ground-glass nodule and stability of the 2 solid nodules. If persistent, repeat CT is recommended every 2 years until 5 years of stability has been established. This recommendation follows the consensus statement: Guidelines for Management of Incidental  Pulmonary Nodules Detected on CT Images: From the Fleischner Society 2017; Radiology 2017; 284:228-243. 5. Aortic and coronary artery atherosclerosis, with dilatation to 4.2 cm in the aortic root and ascending segment. Recommend annual imaging followup by CTA or MRA. This recommendation follows 2010 ACCF/AHA/AATS/ACR/ASA/SCA/SCAI/SIR/STS/SVM Guidelines for the Diagnosis and Management of Patients with Thoracic Aortic Disease. Circulation. 2010; 121: C588-F027. Aortic aneurysm NOS (ICD10-I71.9) 6. Prominent pulmonary trunk indicating arterial hypertension, with mild cardiomegaly and a small pericardial effusion. 7. Gastroenteritis, constipation and diverticulosis. 8. Nonobstructive nephrolithiasis. 9. Minimal pelvic ascites. 10. Moderately thickened rectal wall which could be due to proctitis, congestive or due to infiltrating disease. Further evaluation recommended. Aortic Atherosclerosis (ICD10-I70.0). Electronically Signed   By: Telford Nab M.D.   On: 01/27/2022 23:23   CT CERVICAL SPINE WO CONTRAST  Result Date: 01/27/2022 CLINICAL DATA:  Blunt polytrauma. Frequent falls past 5 weeks most recently today. Unable to get up after the most recent fall. EXAM: CT CERVICAL SPINE WITHOUT CONTRAST TECHNIQUE: Multidetector CT imaging of the cervical spine was performed without intravenous contrast. Multiplanar CT image reconstructions were also generated. RADIATION DOSE REDUCTION: This exam was performed according to the departmental dose-optimization program which includes automated exposure control, adjustment of the mA and/or kV according to patient size and/or use of iterative reconstruction technique. COMPARISON:  MRI cervical spine 07/19/2017. FINDINGS: Alignment: Straightened, with a minimal chronic discogenic retrolisthesis at C6-7, stable. Narrowing and osteophytes of the anterior atlantodental joint are again shown as well. Skull base and vertebrae: Osteopenia. No fracture is evident. No primary pathologic  bone process apart from osteopenia. Soft tissues and spinal canal: No prevertebral fluid or swelling. No visible canal hematoma. Congenitally short pedicles in this patient reduce the effective AP diameter of the thecal sac, including at C1-2 where there also hypertrophic changes of the C1-2 joint and 7 mm AP thecal sac stenosis with slight cord compression. Both proximal cervical ICAs are heavily calcified and there are likely flow-limiting stenoses of both. There are calcifications in both distal vertebral arteries. There is no laryngeal mass. No thyroid nodule. Disc levels: The discs are completely collapsed from C2-3 through C6-7, with chronic fusion across C4-5 vertebral bodies, and bidirectional osteophytes. The C2-3 and C7-T1 discs are normal in heights. Posterior disc osteophyte complexes variably narrow the  thecal sac but especially so C5-6 and C6-7 where there is spondylotic cord compression eccentric to the right at both levels with more significant spinal canal stenosis at these levels. There is partial effacement of the ventral CSF at C3-4 and C4-5 but without spondylotic cord compression. Facet joint and uncinate hypertrophy is seen at all levels. Resulting foraminal stenosis is severe on the left and moderate on the right at C2-3, severe on the left and moderate to severe on the right at C3-4, bilaterally moderate to severe at C4-5, bilaterally severe at C5-6 and bilaterally moderate to severe at C6-7. Upper chest: Negative. Other: None. IMPRESSION: 1. Osteopenia and degenerative change without evidence of fractures. 2. Congenitally short pedicles and degenerative disc changes with multilevel spondylotic cord encroachment, greatest at C5-6 and C6-7. 3. Straightened lordosis with chronic discogenic retrolisthesis C6-7. 4. 7 mm AP thecal sac stenosis at C1-2 with slight cord compression. 5. Both proximal cervical ICAs are heavily calcified and there are likely flow-limiting stenoses of both. Follow-up  as indicated. Electronically Signed   By: Telford Nab M.D.   On: 01/27/2022 22:46   CT HEAD WO CONTRAST  Result Date: 01/27/2022 CLINICAL DATA:  Head trauma EXAM: CT HEAD WITHOUT CONTRAST TECHNIQUE: Contiguous axial images were obtained from the base of the skull through the vertex without intravenous contrast. RADIATION DOSE REDUCTION: This exam was performed according to the departmental dose-optimization program which includes automated exposure control, adjustment of the mA and/or kV according to patient size and/or use of iterative reconstruction technique. COMPARISON:  None Available. FINDINGS: Brain: No evidence of acute infarction, hemorrhage, hydrocephalus, extra-axial collection or mass lesion/mass effect. There is mild diffuse atrophy and mild periventricular white matter hypodensity, likely chronic small vessel ischemic change. Vascular: Atherosclerotic calcifications are present within the cavernous internal carotid arteries. Skull: Normal. Negative for fracture or focal lesion. Sinuses/Orbits: No acute finding. Other: None. IMPRESSION: 1. No acute intracranial process. 2. Mild diffuse atrophy and mild chronic small vessel ischemic change. Electronically Signed   By: Ronney Asters M.D.   On: 01/27/2022 22:32   DG FEMUR, MIN 2 VIEWS RIGHT  Result Date: 01/27/2022 CLINICAL DATA:  Fall and pain in the right lower extremity. EXAM: RIGHT FEMUR 2 VIEWS COMPARISON:  None Available. FINDINGS: No acute fracture or dislocation. The bones are osteopenic. Severe arthritic changes of the right knee. Atherosclerotic calcification of the right lower extremity vasculature. The soft tissues are unremarkable. IMPRESSION: 1. No acute fracture or dislocation. 2. Severe arthritic changes of the right knee. Electronically Signed   By: Anner Crete M.D.   On: 01/27/2022 22:25    Procedures Procedures    Medications Ordered in ED Medications  lidocaine (LIDODERM) 5 % 1 patch (1 patch Transdermal Patch  Applied 01/28/22 0044)  potassium chloride (KLOR-CON) packet 20 mEq (has no administration in time range)  oxyCODONE-acetaminophen (PERCOCET/ROXICET) 5-325 MG per tablet 1 tablet (1 tablet Oral Given 01/27/22 1941)  methocarbamol (ROBAXIN) tablet 500 mg (500 mg Oral Given 01/28/22 0032)    ED Course/ Medical Decision Making/ A&P                             Medical Decision Making Amount and/or Complexity of Data Reviewed Labs: ordered. Radiology: ordered. ECG/medicine tests: ordered.  Risk Prescription drug management.   This patient presents to the ED for concern of syncope and fall, this involves an extensive number of treatment options, and is a complaint that carries with  it a high risk of complications and morbidity.  The differential diagnosis includes acute injuries, arrhythmia, dehydration, polypharmacy, infection, metabolic derangements   Co morbidities that complicate the patient evaluation  T2DM, HLD, CAD, sleep apnea, COPD, HTN, PUD   Additional history obtained:  Additional history obtained from patient's daughter External records from outside source obtained and reviewed including EMR   Lab Tests:  I Ordered, and personally interpreted labs.  The pertinent results include: Leukocytosis is present.  Mild hypokalemia is present.  His creatinine appears to be elevated from baseline, however, most recent prior lab work was from over a year ago.   Imaging Studies ordered:  I ordered imaging studies including CT of head, cervical spine, chest, abdomen, pelvis, T-spine, L-spine I independently visualized and interpreted imaging which showed acute compression fracture at L1; gastroenteritis; constipation; rectal wall thickening; lung nodules, aortic root and ascending segment dilatation, small pericardial effusion, trace pleural effusions. I agree with the radiologist interpretation   Cardiac Monitoring: / EKG:  The patient was maintained on a cardiac monitor.  I  personally viewed and interpreted the cardiac monitored which showed an underlying rhythm of: Sinus rhythm with what appears to be second-degree type II heart block   Consultations Obtained:  I requested consultation with the cardiologist, Dr. Tacy Learn,  and discussed lab and imaging findings as well as pertinent plan - they recommend: Admission to hospitalist on telemetry and transfer to [facility].  Cardiology will evaluate in the morning.   Problem List / ED Course / Critical interventions / Medication management  Patient presenting after a fall at home.  Since the fall, he has had pain in the right side of his abdomen and worsened pain in his right hip.  During his fall, he did strike his.  On arrival in the ED, he is alert and oriented.  His daughter accompanies him at bedside.  She helps provide history given what appears to be some amnesia to recent events at home.  Dose of Percocet was ordered for analgesia.  Imaging studies were ordered to assess for acute injuries.  Laboratory workup was ordered given concern of possible syncopal episode.  Lab work is notable for leukocytosis.  He has mild hypokalemia.  Creatinine is elevated when compared to prior lab work.  There is no recent lab work available for close comparison.  On CT imaging, patient does have an acute L1 compression fracture.  TLSO brace was ordered.  TLSO rep notified that this would be placed in the morning.  Patient was given multimodal pain control for his ongoing back pain.  On cardiac monitor, he does have skipped beats.  Rhythm is consistent with type II second-degree heart block.  Strips are shown below:   I discussed this with cardiologist on-call, Dr. Tacy Learn, who recommends admission to hospitalist and transfer to [facility].  Patient was admitted for further management. I ordered medication including Percocet, Robaxin, lidocaine patch for analgesia Reevaluation of the patient after these medicines showed that the patient  improved I have reviewed the patients home medicines and have made adjustments as needed   Social Determinants of Health:  Lives at home with family support  CRITICAL CARE Performed by: Godfrey Pick   Total critical care time: 34 minutes  Critical care time was exclusive of separately billable procedures and treating other patients.  Critical care was necessary to treat or prevent imminent or life-threatening deterioration.  Critical care was time spent personally by me on the following activities: development of treatment  plan with patient and/or surrogate as well as nursing, discussions with consultants, evaluation of patient's response to treatment, examination of patient, obtaining history from patient or surrogate, ordering and performing treatments and interventions, ordering and review of laboratory studies, ordering and review of radiographic studies, pulse oximetry and re-evaluation of patient's condition.         Final Clinical Impression(s) / ED Diagnoses Final diagnoses:  Heart block AV second degree  Closed compression fracture of body of L1 vertebra (HCC)  Syncope and collapse    Rx / DC Orders ED Discharge Orders     None         Godfrey Pick, MD 01/28/22 0139

## 2022-01-27 NOTE — ED Notes (Signed)
ED Provider at bedside. 

## 2022-01-27 NOTE — ED Notes (Signed)
Lab at bedside

## 2022-01-28 ENCOUNTER — Encounter (HOSPITAL_COMMUNITY): Payer: Self-pay

## 2022-01-28 ENCOUNTER — Inpatient Hospital Stay (HOSPITAL_COMMUNITY): Payer: Medicare Other

## 2022-01-28 ENCOUNTER — Encounter (HOSPITAL_COMMUNITY): Payer: Self-pay | Admitting: Family Medicine

## 2022-01-28 DIAGNOSIS — Z7409 Other reduced mobility: Secondary | ICD-10-CM | POA: Diagnosis not present

## 2022-01-28 DIAGNOSIS — N1832 Chronic kidney disease, stage 3b: Secondary | ICD-10-CM | POA: Diagnosis present

## 2022-01-28 DIAGNOSIS — I771 Stricture of artery: Secondary | ICD-10-CM | POA: Diagnosis not present

## 2022-01-28 DIAGNOSIS — M4802 Spinal stenosis, cervical region: Secondary | ICD-10-CM | POA: Diagnosis not present

## 2022-01-28 DIAGNOSIS — R296 Repeated falls: Secondary | ICD-10-CM | POA: Diagnosis present

## 2022-01-28 DIAGNOSIS — I129 Hypertensive chronic kidney disease with stage 1 through stage 4 chronic kidney disease, or unspecified chronic kidney disease: Secondary | ICD-10-CM | POA: Diagnosis present

## 2022-01-28 DIAGNOSIS — Z9181 History of falling: Secondary | ICD-10-CM | POA: Diagnosis not present

## 2022-01-28 DIAGNOSIS — F17210 Nicotine dependence, cigarettes, uncomplicated: Secondary | ICD-10-CM | POA: Diagnosis present

## 2022-01-28 DIAGNOSIS — S32010A Wedge compression fracture of first lumbar vertebra, initial encounter for closed fracture: Secondary | ICD-10-CM | POA: Diagnosis not present

## 2022-01-28 DIAGNOSIS — I459 Conduction disorder, unspecified: Secondary | ICD-10-CM

## 2022-01-28 DIAGNOSIS — D649 Anemia, unspecified: Secondary | ICD-10-CM | POA: Diagnosis not present

## 2022-01-28 DIAGNOSIS — I77810 Thoracic aortic ectasia: Secondary | ICD-10-CM | POA: Diagnosis present

## 2022-01-28 DIAGNOSIS — E1122 Type 2 diabetes mellitus with diabetic chronic kidney disease: Secondary | ICD-10-CM | POA: Diagnosis present

## 2022-01-28 DIAGNOSIS — S32010D Wedge compression fracture of first lumbar vertebra, subsequent encounter for fracture with routine healing: Secondary | ICD-10-CM | POA: Diagnosis not present

## 2022-01-28 DIAGNOSIS — I10 Essential (primary) hypertension: Secondary | ICD-10-CM | POA: Diagnosis not present

## 2022-01-28 DIAGNOSIS — E119 Type 2 diabetes mellitus without complications: Secondary | ICD-10-CM | POA: Diagnosis not present

## 2022-01-28 DIAGNOSIS — R41841 Cognitive communication deficit: Secondary | ICD-10-CM | POA: Diagnosis not present

## 2022-01-28 DIAGNOSIS — Z66 Do not resuscitate: Secondary | ICD-10-CM | POA: Diagnosis present

## 2022-01-28 DIAGNOSIS — Z7401 Bed confinement status: Secondary | ICD-10-CM | POA: Diagnosis not present

## 2022-01-28 DIAGNOSIS — E782 Mixed hyperlipidemia: Secondary | ICD-10-CM | POA: Diagnosis present

## 2022-01-28 DIAGNOSIS — I2510 Atherosclerotic heart disease of native coronary artery without angina pectoris: Secondary | ICD-10-CM | POA: Diagnosis not present

## 2022-01-28 DIAGNOSIS — I6529 Occlusion and stenosis of unspecified carotid artery: Secondary | ICD-10-CM | POA: Insufficient documentation

## 2022-01-28 DIAGNOSIS — K219 Gastro-esophageal reflux disease without esophagitis: Secondary | ICD-10-CM | POA: Insufficient documentation

## 2022-01-28 DIAGNOSIS — Z4789 Encounter for other orthopedic aftercare: Secondary | ICD-10-CM | POA: Diagnosis not present

## 2022-01-28 DIAGNOSIS — R55 Syncope and collapse: Secondary | ICD-10-CM

## 2022-01-28 DIAGNOSIS — Z8249 Family history of ischemic heart disease and other diseases of the circulatory system: Secondary | ICD-10-CM | POA: Diagnosis not present

## 2022-01-28 DIAGNOSIS — I25119 Atherosclerotic heart disease of native coronary artery with unspecified angina pectoris: Secondary | ICD-10-CM

## 2022-01-28 DIAGNOSIS — E875 Hyperkalemia: Secondary | ICD-10-CM | POA: Diagnosis not present

## 2022-01-28 DIAGNOSIS — M545 Low back pain, unspecified: Secondary | ICD-10-CM | POA: Diagnosis not present

## 2022-01-28 DIAGNOSIS — R531 Weakness: Secondary | ICD-10-CM | POA: Diagnosis not present

## 2022-01-28 DIAGNOSIS — G9341 Metabolic encephalopathy: Secondary | ICD-10-CM | POA: Diagnosis present

## 2022-01-28 DIAGNOSIS — F05 Delirium due to known physiological condition: Secondary | ICD-10-CM | POA: Diagnosis not present

## 2022-01-28 DIAGNOSIS — I4892 Unspecified atrial flutter: Secondary | ICD-10-CM | POA: Diagnosis present

## 2022-01-28 DIAGNOSIS — R262 Difficulty in walking, not elsewhere classified: Secondary | ICD-10-CM | POA: Diagnosis not present

## 2022-01-28 DIAGNOSIS — R410 Disorientation, unspecified: Secondary | ICD-10-CM | POA: Diagnosis not present

## 2022-01-28 DIAGNOSIS — M4856XA Collapsed vertebra, not elsewhere classified, lumbar region, initial encounter for fracture: Secondary | ICD-10-CM | POA: Diagnosis present

## 2022-01-28 DIAGNOSIS — J449 Chronic obstructive pulmonary disease, unspecified: Secondary | ICD-10-CM | POA: Diagnosis present

## 2022-01-28 DIAGNOSIS — E876 Hypokalemia: Secondary | ICD-10-CM | POA: Diagnosis present

## 2022-01-28 DIAGNOSIS — F09 Unspecified mental disorder due to known physiological condition: Secondary | ICD-10-CM | POA: Diagnosis not present

## 2022-01-28 DIAGNOSIS — I6523 Occlusion and stenosis of bilateral carotid arteries: Secondary | ICD-10-CM | POA: Diagnosis not present

## 2022-01-28 DIAGNOSIS — Z720 Tobacco use: Secondary | ICD-10-CM | POA: Diagnosis not present

## 2022-01-28 DIAGNOSIS — E669 Obesity, unspecified: Secondary | ICD-10-CM | POA: Diagnosis present

## 2022-01-28 DIAGNOSIS — W19XXXD Unspecified fall, subsequent encounter: Secondary | ICD-10-CM | POA: Diagnosis not present

## 2022-01-28 DIAGNOSIS — E785 Hyperlipidemia, unspecified: Secondary | ICD-10-CM | POA: Diagnosis not present

## 2022-01-28 DIAGNOSIS — N179 Acute kidney failure, unspecified: Secondary | ICD-10-CM

## 2022-01-28 DIAGNOSIS — Z794 Long term (current) use of insulin: Secondary | ICD-10-CM | POA: Diagnosis not present

## 2022-01-28 DIAGNOSIS — I441 Atrioventricular block, second degree: Secondary | ICD-10-CM | POA: Diagnosis present

## 2022-01-28 DIAGNOSIS — M6281 Muscle weakness (generalized): Secondary | ICD-10-CM | POA: Diagnosis not present

## 2022-01-28 DIAGNOSIS — Z515 Encounter for palliative care: Secondary | ICD-10-CM | POA: Diagnosis not present

## 2022-01-28 DIAGNOSIS — Z79899 Other long term (current) drug therapy: Secondary | ICD-10-CM | POA: Diagnosis not present

## 2022-01-28 DIAGNOSIS — G9340 Encephalopathy, unspecified: Secondary | ICD-10-CM | POA: Diagnosis not present

## 2022-01-28 DIAGNOSIS — I6521 Occlusion and stenosis of right carotid artery: Secondary | ICD-10-CM | POA: Diagnosis present

## 2022-01-28 DIAGNOSIS — Z8546 Personal history of malignant neoplasm of prostate: Secondary | ICD-10-CM | POA: Diagnosis not present

## 2022-01-28 LAB — COMPREHENSIVE METABOLIC PANEL
ALT: 10 U/L (ref 0–44)
AST: 14 U/L — ABNORMAL LOW (ref 15–41)
Albumin: 3.1 g/dL — ABNORMAL LOW (ref 3.5–5.0)
Alkaline Phosphatase: 50 U/L (ref 38–126)
Anion gap: 9 (ref 5–15)
BUN: 62 mg/dL — ABNORMAL HIGH (ref 8–23)
CO2: 20 mmol/L — ABNORMAL LOW (ref 22–32)
Calcium: 8.6 mg/dL — ABNORMAL LOW (ref 8.9–10.3)
Chloride: 106 mmol/L (ref 98–111)
Creatinine, Ser: 2.16 mg/dL — ABNORMAL HIGH (ref 0.61–1.24)
GFR, Estimated: 29 mL/min — ABNORMAL LOW (ref >60)
Glucose, Bld: 106 mg/dL — ABNORMAL HIGH (ref 70–99)
Potassium: 4 mmol/L (ref 3.5–5.1)
Sodium: 135 mmol/L (ref 135–145)
Total Bilirubin: 0.8 mg/dL (ref 0.3–1.2)
Total Protein: 6.1 g/dL — ABNORMAL LOW (ref 6.5–8.1)

## 2022-01-28 LAB — CBG MONITORING, ED
Glucose-Capillary: 97 mg/dL (ref 70–99)
Glucose-Capillary: 98 mg/dL (ref 70–99)
Glucose-Capillary: 159 mg/dL — ABNORMAL HIGH (ref 70–99)
Glucose-Capillary: 120 mg/dL — ABNORMAL HIGH (ref 70–99)

## 2022-01-28 LAB — ECHOCARDIOGRAM COMPLETE
AR max vel: 2.78 cm2
AV Area VTI: 2.72 cm2
AV Area mean vel: 2.38 cm2
AV Mean grad: 3 mmHg
AV Peak grad: 5.1 mmHg
Ao pk vel: 1.13 m/s
Area-P 1/2: 2.75 cm2
Height: 63 in
MV VTI: 2.33 cm2
S' Lateral: 3.3 cm
Weight: 2640 oz

## 2022-01-28 LAB — HEMOGLOBIN A1C
Hgb A1c MFr Bld: 5.5 % (ref 4.8–5.6)
Mean Plasma Glucose: 111.15 mg/dL

## 2022-01-28 LAB — TSH: TSH: 0.978 u[IU]/mL (ref 0.350–4.500)

## 2022-01-28 LAB — MAGNESIUM: Magnesium: 1.8 mg/dL (ref 1.7–2.4)

## 2022-01-28 MED ORDER — HYDROCHLOROTHIAZIDE 12.5 MG PO TABS
12.5000 mg | ORAL_TABLET | Freq: Every day | ORAL | Status: DC
  Administered 2022-01-28: 12.5 mg via ORAL
  Filled 2022-01-28: qty 1

## 2022-01-28 MED ORDER — POTASSIUM CHLORIDE 20 MEQ PO PACK
20.0000 meq | PACK | Freq: Once | ORAL | Status: AC
  Administered 2022-01-28: 20 meq via ORAL
  Filled 2022-01-28: qty 1

## 2022-01-28 MED ORDER — ONDANSETRON HCL 4 MG/2ML IJ SOLN: 4.0000 mg | Freq: Four times a day (QID) | INTRAMUSCULAR | Status: DC | PRN

## 2022-01-28 MED ORDER — INSULIN ASPART 100 UNIT/ML IJ SOLN
0.0000 [IU] | Freq: Three times a day (TID) | INTRAMUSCULAR | Status: DC
  Administered 2022-01-28: 3 [IU] via SUBCUTANEOUS
  Filled 2022-01-28: qty 1

## 2022-01-28 MED ORDER — PANTOPRAZOLE SODIUM 40 MG PO TBEC
40.0000 mg | DELAYED_RELEASE_TABLET | Freq: Every day | ORAL | Status: DC
  Administered 2022-01-28 – 2022-02-06 (×9): 40 mg via ORAL
  Filled 2022-01-28 (×10): qty 1

## 2022-01-28 MED ORDER — SODIUM CHLORIDE 0.9 % IV SOLN: INTRAVENOUS | Status: DC

## 2022-01-28 MED ORDER — OXYCODONE HCL 5 MG PO TABS: 5.0000 mg | ORAL_TABLET | ORAL | Status: DC | PRN

## 2022-01-28 MED ORDER — ACETAMINOPHEN 325 MG PO TABS: 650.0000 mg | ORAL_TABLET | Freq: Four times a day (QID) | ORAL | Status: DC | PRN

## 2022-01-28 MED ORDER — ESCITALOPRAM OXALATE 10 MG PO TABS
10.0000 mg | ORAL_TABLET | Freq: Every day | ORAL | Status: DC
  Administered 2022-01-28 – 2022-02-06 (×9): 10 mg via ORAL
  Filled 2022-01-28 (×10): qty 1

## 2022-01-28 MED ORDER — ONDANSETRON HCL 4 MG PO TABS: 4.0000 mg | ORAL_TABLET | Freq: Four times a day (QID) | ORAL | Status: DC | PRN

## 2022-01-28 MED ORDER — HALOPERIDOL LACTATE 5 MG/ML IJ SOLN: 1.0000 mg | Freq: Four times a day (QID) | INTRAMUSCULAR | Status: DC | PRN

## 2022-01-28 MED ORDER — ROSUVASTATIN CALCIUM 5 MG PO TABS
5.0000 mg | ORAL_TABLET | Freq: Every day | ORAL | Status: DC
  Administered 2022-01-28 – 2022-02-06 (×9): 5 mg via ORAL
  Filled 2022-01-28 (×10): qty 1

## 2022-01-28 MED ORDER — CLOPIDOGREL BISULFATE 75 MG PO TABS
75.0000 mg | ORAL_TABLET | Freq: Every day | ORAL | Status: DC
  Administered 2022-01-28 – 2022-02-06 (×9): 75 mg via ORAL
  Filled 2022-01-28 (×10): qty 1

## 2022-01-28 MED ORDER — LIDOCAINE 5 % EX PTCH
1.0000 | MEDICATED_PATCH | CUTANEOUS | Status: DC
  Administered 2022-01-28 – 2022-02-05 (×7): 1 via TRANSDERMAL
  Filled 2022-01-28 (×9): qty 1

## 2022-01-28 MED ORDER — ACETAMINOPHEN 650 MG RE SUPP: 650.0000 mg | Freq: Four times a day (QID) | RECTAL | Status: DC | PRN

## 2022-01-28 MED ORDER — HEPARIN SODIUM (PORCINE) 5000 UNIT/ML IJ SOLN
5000.0000 [IU] | Freq: Three times a day (TID) | INTRAMUSCULAR | Status: DC
  Administered 2022-01-28 – 2022-02-06 (×27): 5000 [IU] via SUBCUTANEOUS
  Filled 2022-01-28 (×26): qty 1

## 2022-01-28 MED ORDER — METHOCARBAMOL 500 MG PO TABS
500.0000 mg | ORAL_TABLET | Freq: Once | ORAL | Status: AC
  Administered 2022-01-28: 500 mg via ORAL
  Filled 2022-01-28: qty 1

## 2022-01-28 MED ORDER — AMLODIPINE BESYLATE 5 MG PO TABS
5.0000 mg | ORAL_TABLET | Freq: Every day | ORAL | Status: DC
  Administered 2022-01-28 – 2022-01-29 (×2): 5 mg via ORAL
  Filled 2022-01-28 (×2): qty 1

## 2022-01-28 MED ORDER — INSULIN DETEMIR 100 UNIT/ML ~~LOC~~ SOLN
7.0000 [IU] | Freq: Every day | SUBCUTANEOUS | Status: DC
  Administered 2022-01-28 – 2022-02-03 (×6): 7 [IU] via SUBCUTANEOUS
  Filled 2022-01-28 (×9): qty 0.07

## 2022-01-28 MED ORDER — INSULIN ASPART 100 UNIT/ML IJ SOLN: 0.0000 [IU] | Freq: Every day | INTRAMUSCULAR | Status: DC

## 2022-01-28 MED ORDER — GABAPENTIN 100 MG PO CAPS
100.0000 mg | ORAL_CAPSULE | Freq: Every evening | ORAL | Status: DC | PRN
  Administered 2022-02-03: 100 mg via ORAL
  Filled 2022-01-28: qty 1

## 2022-01-28 NOTE — Hospital Course (Signed)
89 y.o. male with medical history significant of coronary artery disease, carotid stenosis, diabetes mellitus, hypertension, peptic ulcer disease, right bundle branch block, and more presents the ED with a chief complaint of fall.  Patient does not know how he fell.  Daughter at bedside provides most the history.  She reports that he was ambulating with his walker and he said he was moving a box out of the way and fell down.  She did not see the fall, but when she got to him immediately after he had abrasion on the back of his head, and could not get up.  He does not say that he lost consciousness.  He was not complaining of chest pain or palpitations according to the daughter.  He had a similar fall a week ago when the caregiver was there.  He was sore but seemed okay, but started complaining more more of low back pain since that first fall.  Today he reports that he cannot put weight on his legs because of the back pain.  For the falls he had incontinence of bowel and bladder so that is nothing new.  Patient's heart block or patient's carotid stenosis could both be contributing to this.  Daughter reports that she has noticed a general decline in him over the past few months.  She says that he has been more fatigued.  They have no further specific complaints at this time.

## 2022-01-28 NOTE — Assessment & Plan Note (Signed)
-  17 units basal insulin at baseline - Reduced basal insulin while in the hospital to 7 units - Sliding scale coverage - Continue to monitor

## 2022-01-28 NOTE — ED Notes (Signed)
Provider at bedside 

## 2022-01-28 NOTE — Assessment & Plan Note (Signed)
-  TLSO brace ordered - PT eval and treat

## 2022-01-28 NOTE — H&P (Signed)
History and Physical    Patient: [name] [name] [name] [name] DOB: [birth year] DOA: 01/27/2022 DOS: the patient was seen and examined on 01/28/2022 PCP: Prince Solian, MD  Patient coming from: Home  Chief Complaint:  Chief Complaint  Patient presents with   Fall    Back pain   HPI: [name] [name] is a 89 y.o. male with medical history significant of coronary artery disease, carotid stenosis, diabetes mellitus, hypertension, peptic ulcer disease, right bundle branch block, and more presents the ED with a chief complaint of fall.  Patient does not know how he fell.  Daughter at bedside provides most the history.  She reports that he was ambulating with his walker and he said he was moving a box out of the way and fell down.  She did not see the fall, but when she got to him immediately after he had abrasion on the back of his head, and could not get up.  He does not say that he lost consciousness.  He was not complaining of chest pain or palpitations according to the daughter.  He had a similar fall a week ago when the caregiver was there.  He was sore but seemed okay, but started complaining more more of low back pain since that first fall.  Today he reports that he cannot put weight on his legs because of the back pain.  For the falls he had incontinence of bowel and bladder so that is nothing new.  Patient's heart block or patient's carotid stenosis could both be contributing to this.  Daughter reports that she has noticed a general decline in him over the past few months.  She says that he has been more fatigued.  They have no further specific complaints at this time.  Patient reports he has had all the vaccines he can get. Review of Systems: As mentioned in the history of present illness. All other systems reviewed and are negative. Past Medical History:  Diagnosis Date   Bronchitis    Hx of   CAD (coronary artery disease)    (2005 LAD 30-40% stenosis  followed by 60-65% focal stenosis.  The  second diagonal had 80%   stenosis.  The circumflex had 85-90% mid stenosis and was stented with a  Taxus stent.  The obtuse marginal had 20-25% stenosis and obtuse   marginal-2 had 40-50% stenosis.  The right coronary artery had 40-50%  proximal stenosis and mid 75% stenosis), well-preserved ejection  fraction (50-55% )   Carotid stenosis    patient denies all other cardiac symptoms but does reprot sob but relates this to his long history of smoking    DM (diabetes mellitus) ([place])    type 2 mgd on insulin    History of colonic polyps    HTN (hypertension)    Myocardial infarction [facility])    unsure when this was    Obesity    Prostate cancer ([place]) 2001   Radiation    PUD (peptic ulcer disease)    Radiation proctitis    Right bundle branch block    Sigmoid diverticulosis    Hx of   Skin cancer    hx right arm, left ear    Sleep apnea    does not use currently    SOB (shortness of breath)    reports this is baseline due to his smoking    Tobacco abuse    Past Surgical History:  Procedure Laterality Date   CARDIAC CATHETERIZATION  12/13/2003  ptca, stent x 2   CHOLECYSTECTOMY     COLONOSCOPY  05/11/2000   COLONOSCOPY W/ POLYPECTOMY     CYSTOSCOPY W/ RETROGRADES Bilateral 05/13/2018   Procedure: CYSTOSCOPY WITH RETROGRADE PYELOGRAM;  Surgeon: Franchot Gallo, MD;  Location: [facility];  Service: Urology;  Laterality: Bilateral;   HERNIA REPAIR     right inguinal   Left Arm repair  1994   TRANSURETHRAL RESECTION OF BLADDER TUMOR WITH MITOMYCIN-C N/A 05/13/2018   Procedure: TRANSURETHRAL RESECTION OF BLADDER TUMOR WITH MITOMYCIN-C;  Surgeon: Franchot Gallo, MD;  Location: [facility];  Service: Urology;  Laterality: N/A;  30 MNS   Social History:  reports that he has been smoking cigarettes. He has a 60.00 pack-year smoking history. He has never used smokeless tobacco. He reports that he does not drink alcohol and does not use drugs.  No Known Allergies  Family History  Problem Relation  Age of Onset   Cancer Father    Coronary artery disease Father 52   Coronary artery disease Brother 39   Coronary artery disease Sister 13   Stroke Mother 22    Prior to Admission medications   Medication Sig Start Date End Date Taking? Authorizing Provider  cholestyramine (QUESTRAN) 4 g packet Take 1 packet by mouth 2 (two) times daily. 11/08/21  Yes [provider]  escitalopram (LEXAPRO) 10 MG tablet Take 10 mg by mouth daily. 12/02/21  Yes [provider]  amLODipine (NORVASC) 5 MG tablet Take 5 mg by mouth daily.  11/12/17   [provider]  cephALEXin (KEFLEX) 500 MG capsule Take 1 capsule (500 mg total) by mouth 2 (two) times daily. 05/13/18   Franchot Gallo, MD  cholestyramine light (PREVALITE) 4 G packet Take 4 g by mouth daily as needed.     [provider]  clopidogrel (PLAVIX) 75 MG tablet Take 1 tablet (75 mg total) by mouth daily. 01/31/14   Minus Breeding, MD  Cyanocobalamin (VITAMIN B 12 PO) Take 1 tablet by mouth daily.    [provider]  diphenhydramine-acetaminophen (TYLENOL PM) 25-500 MG TABS tablet Take 1 tablet by mouth at bedtime as needed.    [provider]  ferrous sulfate 325 (65 FE) MG EC tablet Take 325 mg by mouth daily with breakfast.     [provider]  gabapentin (NEURONTIN) 100 MG capsule Take 1 capsule by mouth at bedtime as needed (leg pain).  11/12/17   [provider]  ibuprofen (ADVIL,MOTRIN) 200 MG tablet Take 400 mg by mouth every 6 (six) hours as needed (pain).     [provider]  irbesartan-hydrochlorothiazide (AVALIDE) 300-12.5 MG per tablet Take 1 tablet by mouth daily. 01/31/14   Minus Breeding, MD  LANTUS SOLOSTAR 100 UNIT/ML Solostar Pen Inject 34-36 Units into the skin at bedtime. 12/01/17   [provider]  metoprolol succinate (TOPROL-XL) 25 MG 24 hr tablet TAKE 1 TABLET BY MOUTH TWICE DAILY Patient taking differently: Take 25 mg by mouth daily. TAKE 1  TABLET BY MOUTH TWICE DAILY 01/31/14   Minus Breeding, MD  omeprazole (PRILOSEC) 20 MG capsule Take 20 mg by mouth daily.      [provider]  rosuvastatin (CRESTOR) 5 MG tablet Take 5 mg by mouth daily.  11/12/17   [provider]  TRADJENTA 5 MG TABS tablet Take 5 mg by mouth daily.  11/24/17   [provider]    Physical Exam: Vitals:   01/28/22 0230 01/28/22 0300 01/28/22 0430 01/28/22 0500  BP: (!) 145/55 (!) 143/82 139/65 129/63  Pulse: (!) 53 62 62 65  Resp: '17 16 18 17  '$ Temp:      TempSrc:      SpO2: 94% 93% 93% 92%  Weight:      Height:       1.  General: Patient lying supine in bed,  no acute distress   2. Psychiatric: Alert and oriented x 3, mood and behavior normal for situation, pleasant and cooperative with exam   3. Neurologic: Speech and language are normal, face is symmetric, moves all 4 extremities voluntarily, at baseline without acute deficits on limited exam   4. HEENMT:  Abrasion on back of right side of head, normocephalic, pupils reactive to light, neck is supple, trachea is midline, mucous membranes are moist   5. Respiratory : Lungs are clear to auscultation bilaterally without wheezing, rhonchi, rales, no cyanosis, no increase in work of breathing or accessory muscle use   6. Cardiovascular : Heart rate bradycardic, rhythm is regular, no murmurs, rubs or gallops, no peripheral edema, peripheral pulses palpated   7. Gastrointestinal:  Abdomen is soft, nondistended, nontender to palpation bowel sounds active, no masses or organomegaly palpated   8. Skin:  Skin is warm, dry and intact without rashes, acute lesions, or ulcers on limited exam   9.Musculoskeletal:  No acute deformities or trauma, no asymmetry in tone, no peripheral edema, peripheral pulses palpated, no tenderness to palpation in the extremities  Data Reviewed: In the ED 98-90 8.1, heart rate 46-63, respiratory rate 16-24, blood pressure  116/54-162/101 Leukocytosis of 17.6 Urine is not indicative of UTI, no respiratory symptoms or other infectious symptoms Hemoglobin stable at 13.5, platelets 207 Slight hypokalemia at 3.4, elevated BUN at 59, elevated creatinine 2.31 CT C-spine shows no acute fractures, but does show cord encroachment at C5/6 and C6/7.  Also shows likely flow-limiting stenosis in the carotids CT abdomen pelvis shows acute superior endplate compression fracture of L1 vertebra with 4 mm of posterior superior cortical retropulsion, moderately thickened rectal wall which could be due to proctitis or congestive or infiltrative disease TLSO brace ordered, Robaxin and Percocet given Cardiology consulted and recommends admitting patient to [facility] and Plan: * Heart block - Cardiology consulted and recommends admission to [facility] on telemetry and they will see in the a.m. - Holding beta-blocker and calcium channel blocker - Continue to monitor  GERD (gastroesophageal reflux disease) - Continue PPI  Closed compression fracture of L1 vertebra (HCC) - TLSO brace ordered - PT eval and treat  Carotid stenosis - CT scan shows heavily calcified ICAs that are likely flow-limiting stenosis - Ultrasound carotids to assess further - Continue to monitor  Acute renal failure (HCC) - Creatinine increased from 1.63>> 2.31 - Continue IV fluids - Trend in the a.m.  Coronary atherosclerosis - Holding metoprolol in the setting of heart block and bradycardia - Continue statin and Plavix  Essential hypertension - Continue hydrochlorothiazide - Holding beta-blocker and calcium channel blocker due to heart block - Continue to monitor  Diabetes mellitus type 2 in nonobese (HCC) - 17 units basal insulin at baseline - Reduced basal insulin while in the hospital to 7 units - Sliding scale coverage - Continue to monitor      Advance Care Planning:   Code Status: Full Code  Consults:  Cardiology  Family Communication: Daughter at bedside  Severity of Illness: The appropriate patient status for this patient is INPATIENT. Inpatient status is judged  to be reasonable and necessary in order to provide the required intensity of service to ensure the patient's safety. The patient's presenting symptoms, physical exam findings, and initial radiographic and laboratory data in the context of their chronic comorbidities is felt to place them at high risk for further clinical deterioration. Furthermore, it is not anticipated that the patient will be medically stable for discharge from the hospital within 2 midnights of admission.   * I certify that at the point of admission it is my clinical judgment that the patient will require inpatient hospital care spanning beyond 2 midnights from the point of admission due to high intensity of service, high risk for further deterioration and high frequency of surveillance required.*  Author: Rolla Plate, DO 01/28/2022 5:40 AM  For on call review [email or web address].

## 2022-01-28 NOTE — ED Notes (Signed)
Pt reports smoking a pack of cigarettes a day, pt declines nicotine patch

## 2022-01-28 NOTE — ED Notes (Signed)
ED Provider at bedside. 

## 2022-01-28 NOTE — Assessment & Plan Note (Signed)
-  CT scan shows heavily calcified ICAs that are likely flow-limiting stenosis - Ultrasound carotids to assess further - Continue to monitor

## 2022-01-28 NOTE — Assessment & Plan Note (Signed)
-  Continue PPI

## 2022-01-28 NOTE — ED Notes (Addendum)
Patient's family member at bedside attempting to convince patient to eat. Patient yelling at family member and refusing to [place]. Patient had to be moved from room 6 to room 5 to be closer to the nurses station for visibility as patient is attempting to get OOB and attempting to remove back brace.

## 2022-01-28 NOTE — ED Notes (Signed)
Lab at bedside

## 2022-01-28 NOTE — Assessment & Plan Note (Signed)
-  Creatinine increased from 1.63>> 2.31 - Continue IV fluids - Trend in the a.m.

## 2022-01-28 NOTE — ED Notes (Signed)
Back brace getting applied at this time

## 2022-01-28 NOTE — Progress Notes (Addendum)
ASSUMPTION OF CARE NOTE   01/28/2022 12:21 PM  [name] [name] was seen and examined.  The H&P by the admitting provider, orders, imaging was reviewed.  Please see new orders.  Will continue to follow while holding at [facility] for bed at [facility].  I spoke with cardiologist and the recommendation is to let the metoprolol wash out and if persistent evidence of heart block he would need an EP cardiology consultation. Pt noted to have significant carotid stenosis on right 70-99% stenosis noted on [place] but patient does not seem interested in having anything done at this time and not willing to stop smoking telling me to leave him alone.   Pt is waiting for bed to transfer to [facility].  He may change his mind later and be willing to see vascular surgery.  He is hemodynamically stable now however having intermittent bouts of confusion/agitation.  He may have underlying dementia.  Will monitor for now. Added delirium precautions.    Vitals:   01/28/22 1030 01/28/22 1045  BP: (!) 156/78   Pulse: 68 64  Resp: 19 17  Temp:    SpO2:  92%    Results for orders placed or performed during the hospital encounter of 01/27/22  Comprehensive metabolic panel  Result Value Ref Range   Sodium 135 135 - 145 mmol/L   Potassium 3.4 (L) 3.5 - 5.1 mmol/L   Chloride 101 98 - 111 mmol/L   CO2 22 22 - 32 mmol/L   Glucose, Bld 131 (H) 70 - 99 mg/dL   BUN 59 (H) 8 - 23 mg/dL   Creatinine, Ser 2.31 (H) 0.61 - 1.24 mg/dL   Calcium 8.9 8.9 - 10.3 mg/dL   Total Protein 6.9 6.5 - 8.1 g/dL   Albumin 3.6 3.5 - 5.0 g/dL   AST 17 15 - 41 U/L   ALT 12 0 - 44 U/L   Alkaline Phosphatase 61 38 - 126 U/L   Total Bilirubin 0.7 0.3 - 1.2 mg/dL   GFR, Estimated 27 (L) >60 mL/min   Anion gap 12 5 - 15  CBC  Result Value Ref Range   WBC 17.6 (H) 4.0 - 10.5 K/uL   RBC 4.43 4.22 - 5.81 MIL/uL   Hemoglobin 13.5 13.0 - 17.0 g/dL   HCT 41.0 39.0 - 52.0 %   MCV 92.6 80.0 - 100.0 fL   MCH 30.5 26.0 - 34.0 pg   MCHC 32.9 30.0 - 36.0 g/dL   RDW 13.4 11.5  - 15.5 %   Platelets 207 150 - 400 K/uL   nRBC 0.0 0.0 - 0.2 %  Urinalysis, Routine w reflex microscopic Urine, Clean Catch  Result Value Ref Range   Color, Urine YELLOW YELLOW   APPearance HAZY (A) CLEAR   Specific Gravity, Urine 1.017 1.005 - 1.030   pH 5.0 5.0 - 8.0   Glucose, UA NEGATIVE NEGATIVE mg/dL   Hgb urine dipstick MODERATE (A) NEGATIVE   Bilirubin Urine NEGATIVE NEGATIVE   Ketones, ur NEGATIVE NEGATIVE mg/dL   Protein, ur 100 (A) NEGATIVE mg/dL   Nitrite NEGATIVE NEGATIVE   Leukocytes,Ua NEGATIVE NEGATIVE   RBC / HPF 6-10 0 - 5 RBC/hpf   WBC, UA 0-5 0 - 5 WBC/hpf   Bacteria, UA NONE SEEN NONE SEEN   Squamous Epithelial / HPF 0-5 0 - 5 /HPF   Mucus PRESENT    Hyaline Casts, UA PRESENT   Lactic acid, plasma  Result Value Ref Range   Lactic Acid, Venous 1.3 0.5 -  1.9 mmol/L  Protime-INR  Result Value Ref Range   Prothrombin Time 13.8 11.4 - 15.2 seconds   INR 1.1 0.8 - 1.2  Magnesium  Result Value Ref Range   Magnesium 1.8 1.7 - 2.4 mg/dL  CK  Result Value Ref Range   Total CK 97 49 - 397 U/L  Comprehensive metabolic panel  Result Value Ref Range   Sodium 135 135 - 145 mmol/L   Potassium 4.0 3.5 - 5.1 mmol/L   Chloride 106 98 - 111 mmol/L   CO2 20 (L) 22 - 32 mmol/L   Glucose, Bld 106 (H) 70 - 99 mg/dL   BUN 62 (H) 8 - 23 mg/dL   Creatinine, Ser 2.16 (H) 0.61 - 1.24 mg/dL   Calcium 8.6 (L) 8.9 - 10.3 mg/dL   Total Protein 6.1 (L) 6.5 - 8.1 g/dL   Albumin 3.1 (L) 3.5 - 5.0 g/dL   AST 14 (L) 15 - 41 U/L   ALT 10 0 - 44 U/L   Alkaline Phosphatase 50 38 - 126 U/L   Total Bilirubin 0.8 0.3 - 1.2 mg/dL   GFR, Estimated 29 (L) >60 mL/min   Anion gap 9 5 - 15  Magnesium  Result Value Ref Range   Magnesium 1.8 1.7 - 2.4 mg/dL  Hemoglobin A1c  Result Value Ref Range   Hgb A1c MFr Bld 5.5 4.8 - 5.6 %   Mean Plasma Glucose 111.15 mg/dL  TSH  Result Value Ref Range   TSH 0.978 0.350 - 4.500 uIU/mL  CBG monitoring, ED  Result Value Ref Range    Glucose-Capillary 97 70 - 99 mg/dL  CBG monitoring, ED  Result Value Ref Range   Glucose-Capillary 98 70 - 99 mg/dL   Murvin Natal, MD [facility]   01/27/2022  6:18 PM How to contact the [facility] Attending or Consulting provider 7A - 7P or covering provider during after hours 7P -7A, for this patient?  Check the care team in [facility] and look for a) attending/consulting [facility] provider listed and b) the [facility] team listed Log into [email or web address] and use [facility] universal password to access. If you do not have the password, please contact the hospital operator. Locate the [facility] provider you are looking for under [facility] and page to a number that you can be directly reached. If you still have difficulty reaching the provider, please page the [facility] (Director on Call) for the Hospitalists listed on amion for assistance.

## 2022-01-28 NOTE — ED Notes (Signed)
Pt hollering from room. Call bell at bedside. Pt trying to get out of bed stating "Ive got to leave, they are coming to get me from Tommy's house". Pt oriented to place and situation. Pt arguing and agitated states he is at [employer]. Dr Wynetta Emery aware of new confusion and that cbg was checked and 96. Pt repositioned in bed.

## 2022-01-28 NOTE — Assessment & Plan Note (Signed)
-  Cardiology consulted and recommends admission to [facility] on telemetry and they will see in the a.m. - Holding beta-blocker and calcium channel blocker - Continue to monitor

## 2022-01-28 NOTE — ED Notes (Signed)
Patient continues to argue with family member at bedside.  Patient continues to attempt to remove monitoring equipment and TLSO brace.  Patient not able to be redirected at this time.

## 2022-01-28 NOTE — ED Notes (Signed)
Pt lying quietly at this time

## 2022-01-28 NOTE — ED Notes (Signed)
[facility] after hours service called for TLSO Brace. Awaiting return phone call

## 2022-01-28 NOTE — Assessment & Plan Note (Signed)
-  Continue hydrochlorothiazide - Holding beta-blocker and calcium channel blocker due to heart block - Continue to monitor

## 2022-01-28 NOTE — Consult Note (Addendum)
Cardiology Consultation   Patient ID: [name] [name] MRN: [id number]; DOB: 02-08-1932  Admit date: 01/27/2022 Date of Consult: 01/28/2022  PCP:  [name] [name], [facility] Cardiologist:  [name] Breeding, MD        Patient Profile:   [name] [name] is a 89 y.o. male with a hx of CAD, carotid stenosis, HTN, HLD, bradycardia who is being seen 01/28/2022 for the evaluation of heart block and syncope with fall at the request of [name] [name].  History of Present Illness:   [name] [name] has above PMHx and was last seen by Dr. Percival [name] 2020 for surgical clearance. He had sinus bradycardia with long first degree AV block. Yesterday he had a fall unwitnessed and not sure if he had syncope. He did lose control of bowel and bladder. In ED he is bradycardic with first and second degree AV block. CT shows significant carotid disease with probable flow limiting stenosis bilaterally.  Patient says he has had multiple falls every couple days with syncope. He is taking care of his wife with alzheimer's and hasn't seen cardiology because of this. He continues to smoke 1 ppd with no intention of quitting. He denies chest pain or dyspnea. No dizziness or warning signs. Fell back and hit his head with abrasion and has L1 compression fracture.   Past Medical History:  Diagnosis Date   Bronchitis    Hx of   CAD (coronary artery disease)    (2005 LAD 30-40% stenosis  followed by 60-65% focal stenosis.  The second diagonal had 80%   stenosis.  The circumflex had 85-90% mid stenosis and was stented with a  Taxus stent.  The obtuse marginal had 20-25% stenosis and obtuse   marginal-2 had 40-50% stenosis.  The right coronary artery had 40-50%  proximal stenosis and mid 75% stenosis), well-preserved ejection  fraction (50-55% )   Carotid stenosis    History of colonic polyps    HTN (hypertension)    Myocardial infarction [facility])    Obesity    Prostate cancer ([place]) 2001   Radiation    PUD  (peptic ulcer disease)    Radiation proctitis    Right bundle branch block    Sigmoid diverticulosis    Skin cancer    Sleep apnea    Tobacco abuse    Type 2 diabetes mellitus ([place])     Past Surgical History:  Procedure Laterality Date   CARDIAC CATHETERIZATION  12/13/2003   ptca, stent x 2   CHOLECYSTECTOMY     COLONOSCOPY  05/11/2000   COLONOSCOPY W/ POLYPECTOMY     CYSTOSCOPY W/ RETROGRADES Bilateral 05/13/2018   Procedure: CYSTOSCOPY WITH RETROGRADE PYELOGRAM;  Surgeon: Franchot Gallo, MD;  Location: [facility];  Service: Urology;  Laterality: Bilateral;   HERNIA REPAIR     right inguinal   Left Arm repair  1994   TRANSURETHRAL RESECTION OF BLADDER TUMOR WITH MITOMYCIN-C N/A 05/13/2018   Procedure: TRANSURETHRAL RESECTION OF BLADDER TUMOR WITH MITOMYCIN-C;  Surgeon: Franchot Gallo, MD;  Location: [facility];  Service: Urology;  Laterality: N/A;  30 MNS     Home Medications:  Prior to Admission medications   Medication Sig Start Date End Date Taking? Authorizing Provider  cholestyramine (QUESTRAN) 4 g packet Take 1 packet by mouth 2 (two) times daily. 11/08/21  Yes [provider]  escitalopram (LEXAPRO) 10 MG tablet Take 10 mg by mouth daily. 12/02/21  Yes [provider]  amLODipine (NORVASC) 5 MG tablet  Take 5 mg by mouth daily.  11/12/17   [provider]  cephALEXin (KEFLEX) 500 MG capsule Take 1 capsule (500 mg total) by mouth 2 (two) times daily. 05/13/18   Franchot Gallo, MD  cholestyramine light (PREVALITE) 4 G packet Take 4 g by mouth daily as needed.     [provider]  clopidogrel (PLAVIX) 75 MG tablet Take 1 tablet (75 mg total) by mouth daily. 01/31/14   [name] Breeding, MD  Cyanocobalamin (VITAMIN B 12 PO) Take 1 tablet by mouth daily.    [provider]  diphenhydramine-acetaminophen (TYLENOL PM) 25-500 MG TABS tablet Take 1 tablet by mouth at bedtime as needed.    [provider]  ferrous sulfate 325 (65 FE) MG EC  tablet Take 325 mg by mouth daily with breakfast.     [provider]  gabapentin (NEURONTIN) 100 MG capsule Take 1 capsule by mouth at bedtime as needed (leg pain).  11/12/17   [provider]  ibuprofen (ADVIL,MOTRIN) 200 MG tablet Take 400 mg by mouth every 6 (six) hours as needed (pain).     [provider]  irbesartan-hydrochlorothiazide (AVALIDE) 300-12.5 MG per tablet Take 1 tablet by mouth daily. 01/31/14   [name] Breeding, MD  LANTUS SOLOSTAR 100 UNIT/ML Solostar Pen Inject 34-36 Units into the skin at bedtime. 12/01/17   [provider]  metoprolol succinate (TOPROL-XL) 25 MG 24 hr tablet TAKE 1 TABLET BY MOUTH TWICE DAILY Patient taking differently: Take 25 mg by mouth daily. TAKE 1 TABLET BY MOUTH TWICE DAILY 01/31/14   [name] Breeding, MD  omeprazole (PRILOSEC) 20 MG capsule Take 20 mg by mouth daily.      [provider]  rosuvastatin (CRESTOR) 5 MG tablet Take 5 mg by mouth daily.  11/12/17   [provider]  TRADJENTA 5 MG TABS tablet Take 5 mg by mouth daily.  11/24/17   [provider]    Inpatient Medications: Scheduled Meds:  clopidogrel  75 mg Oral Daily   escitalopram  10 mg Oral Daily   heparin  5,000 Units Subcutaneous Q8H   hydrochlorothiazide  12.5 mg Oral Daily   insulin aspart  0-15 Units Subcutaneous TID WC   insulin aspart  0-5 Units Subcutaneous QHS   insulin detemir  7 Units Subcutaneous QHS   lidocaine  1 patch Transdermal Q24H   pantoprazole  40 mg Oral Daily   rosuvastatin  5 mg Oral Daily   Continuous Infusions:  sodium chloride 75 mL/hr at 01/28/22 0550   PRN Meds: acetaminophen **OR** acetaminophen, gabapentin, ondansetron **OR** ondansetron (ZOFRAN) IV, oxyCODONE  Allergies:   No Known Allergies  Social History:   Social History   Socioeconomic History   Marital status: Married    Spouse name: Not on file   Number of children: 4   Years of education: Not on file   Highest  education level: Not on file  Occupational History   Not on file  Tobacco Use   Smoking status: Every Day    Packs/day: 1.00    Years: 60.00    Total pack years: 60.00    Types: Cigarettes   Smokeless tobacco: Never  Vaping Use   Vaping Use: Never used  Substance and Sexual Activity   Alcohol use: No   Drug use: No   Sexual activity: Never  Other Topics Concern   Not on file  Social History Narrative   Not on file   Social Determinants of Health  Financial Resource Strain: Not on file  Food Insecurity: Not on file  Transportation Needs: Not on file  Physical Activity: Not on file  Stress: Not on file  Social Connections: Not on file  Intimate Partner Violence: Not on file    Family History:     Family History  Problem Relation Age of Onset   Cancer Father    Coronary artery disease Father 33   Coronary artery disease Brother 30   Coronary artery disease Sister 68   Stroke Mother 40     ROS:  Please see the history of present illness.   Review of Systems  Constitutional: Negative.  HENT:  Positive for hearing loss.   Cardiovascular:  Positive for syncope.  Respiratory:  Positive for wheezing.   Endocrine: Negative.   Hematologic/Lymphatic: Negative.   Musculoskeletal:  Positive for arthritis, back pain, falls, joint pain and stiffness.  Gastrointestinal: Negative.   Genitourinary:  Positive for frequency.    All other ROS reviewed and negative.     Physical Exam/Data:   Vitals:   01/28/22 0645 01/28/22 0700 01/28/22 0710 01/28/22 0730  BP:  (!) 152/68  (!) 144/79  Pulse: 67   64  Resp: '19 18  16  '$ Temp:   98.2 F (36.8 C)   TempSrc:   Oral   SpO2: 97%   92%  Weight:      Height:        Intake/Output Summary (Last 24 hours) at 01/28/2022 0850 Last data filed at 01/28/2022 0810 Gross per 24 hour  Intake --  Output 300 ml  Net -300 ml      01/27/2022    6:25 PM 05/13/2018    9:00 AM 05/07/2018   10:39 AM  Last 3 Weights  Weight (lbs) 165 lb  210 lb 210 lb  Weight (kg) 74.844 kg 95.255 kg 95.255 kg     Body mass index is 29.23 kg/m.  General:  Well nourished, well developed, in no acute distress  HEENT: normal Neck: no JVD Vascular: bilateral carotid bruits; Distal pulses 2+ bilaterally Cardiac:  normal S1, S2; RRR; no murmur   Lungs:  scattered wheezing and rhonchi  Abd: soft, nontender, no hepatomegaly  Ext: no edema Musculoskeletal:  No deformities, BUE and BLE strength normal and equal Skin: warm and dry  Neuro:  CNs 2-12 intact, no focal abnormalities noted Psych:  Normal affect   EKG:  The EKG was personally reviewed and demonstrates:  bradycardia with first and second degree AV block, RBBB Telemetry:  Telemetry was personally reviewed and demonstrates:  currently sinus bradycardia-NSR  Relevant CV Studies:   Carotid Dopplers 11/08/2018: Summary:  Right Carotid: Velocities in the right ICA are consistent with a 60-79%                 stenosis; however, velocities may be underestimated due to                 extensive calcified plaque with acoustic shadowing.  Visualization                of the origin and first 1.5 cm on the internal carotid  artery                cannot be visualized due to shadowing.   Left Carotid: Velocities in the left ICA are consistent with a 1-39%  stenosis.   Vertebrals: Bilateral vertebral arteries demonstrate antegrade flow.  Subclavians: Normal flow hemodynamics were seen in bilateral subclavian  arteries.   Laboratory Data:  Chemistry Recent Labs  Lab 01/27/22 1947 01/28/22 0503  NA 135 135  K 3.4* 4.0  CL 101 106  CO2 22 20*  GLUCOSE 131* 106*  BUN 59* 62*  CREATININE 2.31* 2.16*  CALCIUM 8.9 8.6*  MG 1.8  1.8  --   GFRNONAA 27* 29*  ANIONGAP 12 9    Recent Labs  Lab 01/27/22 1947 01/28/22 0503  PROT 6.9 6.1*  ALBUMIN 3.6 3.1*  AST 17 14*  ALT 12 10  ALKPHOS 61 50  BILITOT 0.7 0.8   Hematology Recent Labs  Lab 01/27/22 1947  WBC  17.6*  RBC 4.43  HGB 13.5  HCT 41.0  MCV 92.6  MCH 30.5  MCHC 32.9  RDW 13.4  PLT 207   Radiology/Studies:  CT L-SPINE NO CHARGE  Result Date: 01/27/2022 CLINICAL DATA:  Fall with back pain. EXAM: CT Thoracic and Lumbar spine without contrast TECHNIQUE: Multidetector CT imaging of the thoracic and lumbar spine was performed without intravenous contrast. Multiplanar CT image reconstructions were also generated. RADIATION DOSE REDUCTION: This exam was performed according to the departmental dose-optimization program which includes automated exposure control, adjustment of the mA and/or kV according to patient size and/or use of iterative reconstruction technique. CONTRAST:  None. COMPARISON:  Last AP and lateral chest was 01/02/2011, last CT abdomen and pelvis with reconstructions was 02/21/2018. FINDINGS: CT THORACIC SPINE FINDINGS Segmentation: There are 12 rib-bearing thoracic type segments. Alignment: There is mild thoracic kyphodextroscoliosis without AP listhesis. Vertebrae: There is osteopenia without evidence of acute compression fracture. There is intact right anterolateral bridging enthesopathy multiple levels starting at T5 entering at T11. Spondylosis at all levels. No fracture or primary or pathologic process is seen apart from osteopenia. Paraspinal and other soft tissues: No acute findings. No fracture of the visible posterior ribs is seen. Disc levels: The discs are partially degenerated and middle 1/3 of the thoracic spine. The greatest disc space loss, with vacuum phenomenon noted at T7-8. There is a posterior disc osteophyte complex at this level partially effacing the ventral CSF but no frank cord compression is evident. Without contrast no significant soft tissue or bony encroachment on the thecal sac is seen elsewhere. Hypertrophic facets impress on the dorsolateral thecal sac at T9-10 and T10-11 but do not compress the cord as far as seen. The foramina are moderately stenotic at  T7-8, left-greater-than-right but are not stenotic at the remaining thoracic levels despite mild facet spurs. CT LUMBAR SPINE FINDINGS Segmentation: 5 lumbar type vertebrae. Alignment: There is a grade 1 degenerative spondylolisthesis at L4-5, and a minimal chronic L5-S1 degenerative retrolisthesis otherwise normal alignment, with anterolisthesis due to advanced facet hypertrophy at L4-5. Vertebrae: There is osteopenia. There is acute upper plate anterior wedge compression fracture deformity of the L1 vertebral body with nondisplaced fragmentation along the anterior upper plate, anterior vertebral height loss 30-40%, posterior height loss 20-25%, and retropulsion of the posterosuperior cortex up to 4 mm mildly effacing the ventral CSF. A linear transverse fracture line is noted underlying the compressed L1 upper plate. The pedicles and posterior elements are intact. There is no other evidence of fractures. Paraspinal and other soft tissues: The aorta, iliac arteries and visceral branch arteries are heavily calcified. There is no AAA. There is no paraspinal hematoma. There is some paraspinal edema noted alongside L1. Disc levels: There is mild flattening of the ventral thecal sac just below the level of T12-L1 due to the posterosuperior L1 cortical retropulsion. There  is no significant spinal canal stenosis. There is a mild nonstenosing posterior disc bulge at T12-L1. Normal disc height. The foramina are clear. L1-2: Moderate disc space loss. Bidirectional osteophytes are noted with mild spinal stenosis a posterior disc osteophyte complex. There is right-greater-than-left facet hypertrophy with severe right and moderate left foraminal stenosis. L2-3: The disc is normal in height. There are mild endplate spurs. There is mild spinal canal stenosis due to a diffuse disc bulge, ligamentous and facet hypertrophy. Mild-to-moderate right and mild left foraminal stenosis. L3-4: This disc is normal in height. There are small  endplate spurs. Similar to L2 there is mild spinal canal stenosis due to dorsal ligamentous and facet hypertrophy and a broad posterior disc bulge. There is mild foraminal stenosis. L4-5: There is mild disc space loss. Diffuse annular bulge is seen. There are small endplate osteophytes. There is severe spinal canal stenosis due to advanced facet hypertrophy and dorsal ligamentous thickening. Vertical foraminal stenosis is seen with bilateral moderate to severe foraminal stenosis. L5-S1: The disc is normal in height. Broad posterior disc bulge is seen mildly compressing the S1 nerve roots. There is mild spinal canal stenosis. There is mild facet hypertrophy with mild foraminal stenosis. Other: The SI joints are patent. No sacral insufficiency fracture seen. There is spurring of both anterior SI joints. IMPRESSION: 1. Osteopenia and degenerative change of the thoracic spine without evidence of thoracic spine fractures. 2. Mild thoracic kyphodextroscoliosis. 3. Lumbar spine CT: Acute upper plate anterior wedge compression fracture of the L1 vertebral body with nondisplaced fragmentation along the anterior upper plate, 30-40% anterior vertebral height loss, 20-25% posterior height loss, and 4 mm retropulsion of the posterosuperior cortex without significant spinal canal stenosis. 4. Osteopenia and degenerative change without evidence of further lumbar spine fractures. 5. Severe acquired spinal canal stenosis at L4-5 with mild spinal canal stenosis at L2-3 and L3-4 and L5-S1. 6. Multilevel lumbar foraminal stenosis greatest on the right at L1-2 and bilaterally at L4-5. 7. Aortic and branch vessel atherosclerosis. Aortic Atherosclerosis (ICD10-I70.0). Electronically Signed   By: Telford Nab M.D.   On: 01/27/2022 23:58   CT T-SPINE NO CHARGE  Result Date: 01/27/2022 CLINICAL DATA:  Fall with back pain. EXAM: CT Thoracic and Lumbar spine without contrast TECHNIQUE: Multidetector CT imaging of the thoracic and lumbar  spine was performed without intravenous contrast. Multiplanar CT image reconstructions were also generated. RADIATION DOSE REDUCTION: This exam was performed according to the departmental dose-optimization program which includes automated exposure control, adjustment of the mA and/or kV according to patient size and/or use of iterative reconstruction technique. CONTRAST:  None. COMPARISON:  Last AP and lateral chest was 01/02/2011, last CT abdomen and pelvis with reconstructions was 02/21/2018. FINDINGS: CT THORACIC SPINE FINDINGS Segmentation: There are 12 rib-bearing thoracic type segments. Alignment: There is mild thoracic kyphodextroscoliosis without AP listhesis. Vertebrae: There is osteopenia without evidence of acute compression fracture. There is intact right anterolateral bridging enthesopathy multiple levels starting at T5 entering at T11. Spondylosis at all levels. No fracture or primary or pathologic process is seen apart from osteopenia. Paraspinal and other soft tissues: No acute findings. No fracture of the visible posterior ribs is seen. Disc levels: The discs are partially degenerated and middle 1/3 of the thoracic spine. The greatest disc space loss, with vacuum phenomenon noted at T7-8. There is a posterior disc osteophyte complex at this level partially effacing the ventral CSF but no frank cord compression is evident. Without contrast no significant soft tissue or bony encroachment  on the thecal sac is seen elsewhere. Hypertrophic facets impress on the dorsolateral thecal sac at T9-10 and T10-11 but do not compress the cord as far as seen. The foramina are moderately stenotic at T7-8, left-greater-than-right but are not stenotic at the remaining thoracic levels despite mild facet spurs. CT LUMBAR SPINE FINDINGS Segmentation: 5 lumbar type vertebrae. Alignment: There is a grade 1 degenerative spondylolisthesis at L4-5, and a minimal chronic L5-S1 degenerative retrolisthesis otherwise normal  alignment, with anterolisthesis due to advanced facet hypertrophy at L4-5. Vertebrae: There is osteopenia. There is acute upper plate anterior wedge compression fracture deformity of the L1 vertebral body with nondisplaced fragmentation along the anterior upper plate, anterior vertebral height loss 30-40%, posterior height loss 20-25%, and retropulsion of the posterosuperior cortex up to 4 mm mildly effacing the ventral CSF. A linear transverse fracture line is noted underlying the compressed L1 upper plate. The pedicles and posterior elements are intact. There is no other evidence of fractures. Paraspinal and other soft tissues: The aorta, iliac arteries and visceral branch arteries are heavily calcified. There is no AAA. There is no paraspinal hematoma. There is some paraspinal edema noted alongside L1. Disc levels: There is mild flattening of the ventral thecal sac just below the level of T12-L1 due to the posterosuperior L1 cortical retropulsion. There is no significant spinal canal stenosis. There is a mild nonstenosing posterior disc bulge at T12-L1. Normal disc height. The foramina are clear. L1-2: Moderate disc space loss. Bidirectional osteophytes are noted with mild spinal stenosis a posterior disc osteophyte complex. There is right-greater-than-left facet hypertrophy with severe right and moderate left foraminal stenosis. L2-3: The disc is normal in height. There are mild endplate spurs. There is mild spinal canal stenosis due to a diffuse disc bulge, ligamentous and facet hypertrophy. Mild-to-moderate right and mild left foraminal stenosis. L3-4: This disc is normal in height. There are small endplate spurs. Similar to L2 there is mild spinal canal stenosis due to dorsal ligamentous and facet hypertrophy and a broad posterior disc bulge. There is mild foraminal stenosis. L4-5: There is mild disc space loss. Diffuse annular bulge is seen. There are small endplate osteophytes. There is severe spinal canal  stenosis due to advanced facet hypertrophy and dorsal ligamentous thickening. Vertical foraminal stenosis is seen with bilateral moderate to severe foraminal stenosis. L5-S1: The disc is normal in height. Broad posterior disc bulge is seen mildly compressing the S1 nerve roots. There is mild spinal canal stenosis. There is mild facet hypertrophy with mild foraminal stenosis. Other: The SI joints are patent. No sacral insufficiency fracture seen. There is spurring of both anterior SI joints. IMPRESSION: 1. Osteopenia and degenerative change of the thoracic spine without evidence of thoracic spine fractures. 2. Mild thoracic kyphodextroscoliosis. 3. Lumbar spine CT: Acute upper plate anterior wedge compression fracture of the L1 vertebral body with nondisplaced fragmentation along the anterior upper plate, 30-40% anterior vertebral height loss, 20-25% posterior height loss, and 4 mm retropulsion of the posterosuperior cortex without significant spinal canal stenosis. 4. Osteopenia and degenerative change without evidence of further lumbar spine fractures. 5. Severe acquired spinal canal stenosis at L4-5 with mild spinal canal stenosis at L2-3 and L3-4 and L5-S1. 6. Multilevel lumbar foraminal stenosis greatest on the right at L1-2 and bilaterally at L4-5. 7. Aortic and branch vessel atherosclerosis. Aortic Atherosclerosis (ICD10-I70.0). Electronically Signed   By: Telford Nab M.D.   On: 01/27/2022 23:58   CT CHEST ABDOMEN PELVIS WO CONTRAST  Result Date: 01/27/2022 CLINICAL DATA:  Frequent falls, most recently today. EXAM: CT CHEST, ABDOMEN AND PELVIS WITHOUT CONTRAST TECHNIQUE: Multidetector CT imaging of the chest, abdomen and pelvis was performed following the standard protocol without IV contrast. RADIATION DOSE REDUCTION: This exam was performed according to the departmental dose-optimization program which includes automated exposure control, adjustment of the mA and/or kV according to patient size and/or  use of iterative reconstruction technique. COMPARISON:  CT abdomen and pelvis without contrast 02/21/2018, CT abdomen and pelvis 11/21/2017 with contrast. No prior chest CT. Nutrition chest x-ray was PA Lat 01/02/2011. FINDINGS: CT CHEST FINDINGS Cardiovascular: The heart is slightly enlarged. Coronary arteries are heavily calcified. There are patchy calcifications in the aorta with tortuosity in the descending segment and mild dilatation in the aortic root at the sinuses of Valsalva and in the ascending segment both of which measure 4.2 cm. The pulmonary trunk is prominent at 3.2 cm indicating arterial hypertension and there is a small pericardial effusion. The pulmonary veins are decompressed. There are scattered calcifications of the great vessels. Mediastinum/Nodes: No enlarged mediastinal, hilar, or axillary lymph nodes. Thyroid gland, trachea, and esophagus demonstrate no significant findings. There is a small amount of scattered retained secretions in the trachea. The main bronchi are clear. Lungs/Pleura: There are bilateral trace pleural effusions. There is no pneumothorax. No pulmonary contusion or confluent airspace infiltrate is seen. There is diffuse bronchial thickening without visible bronchial plugging. There are a few small scattered thin walled air cysts on the right, scattered linear atelectasis in the posterior basal lower lobes, lingular base. Anteriorly in the left upper lobe lingula there is a subpleural ground-glass nodule on 4:69 measuring 1 cm. In the left lower lobe just posterior to the crest of the diaphragm there is a 7 mm noncalcified nodule on 4:90. Medially in the right middle lobe, there is a second nodule also measuring 7 mm on 4:74. Rest of the lung fields are clear, with no other visible nodules. Musculoskeletal: No thoracic spinal compression injury is seen. There is intact anterior bridging enthesopathy in the mid and lower thirds of the thoracic spine. There are multilevel  healed fracture deformities of the left rib cage. No displaced acute rib fracture seen. Generalized osteopenia. The chest wall is unremarkable. CT ABDOMEN PELVIS FINDINGS Hepatobiliary: There is a calcified granuloma in the right lobe, 1 in the left lobe. No other focal liver abnormality is seen without contrast. Gallbladder is absent, with no biliary dilatation. Pancreas: Partially atrophic and otherwise unremarkable without contrast. Spleen: There are calcified granulomas. Chronic capsular retraction posteriorly suggesting scarring. No other focal splenic abnormality. No splenomegaly. Adrenals/Urinary Tract: No adrenal or renal mass or hemorrhage is seen without contrast. Small cyst in the inferior pole of the left kidney are unchanged. Mild generalized adrenal hyperplasia appears similar. There are small scattered bilateral intrarenal caliceal stones and additional calcifications consistent with renovascular linear calcifications at both renal hila. No ureteral stones or hydronephrosis are seen. Bilateral perinephric stranding appears similar. Both kidneys are low normal in size. No bladder thickening is seen. Stomach/Bowel: Moderate thickened folds proximal to mid stomach. Mild fluid filling of some of the upper abdominal normal caliber small bowel seen possibly reflecting nonspecific enteritis. No small bowel obstruction or inflammation is seen. The appendix is normal. There is mild to moderate retained stool in the ascending and transverse colon, left-sided diverticula most advanced in the sigmoid segment. No findings of acute diverticulitis are seen. The rectal wall is moderately thickened which could be due to proctitis, congestive or due to  infiltrating disease. Further evaluation recommended. Vascular/Lymphatic: There is extensive aortoiliac and branch vessel atherosclerosis without AAA. No adenopathy is seen. Reproductive: No prostatomegaly. Other: Minimal presacral pelvic ascites and trace ascites distal  left paracolic gutter. No free air, free hemorrhage or incarcerated hernias. Bilateral small inguinal fat hernias. Musculoskeletal: There is an acute superior endplate compression fracture of the L1 vertebral body. Loss of anterior height is 30-40%, loss of posterior height 20-25% with 4 mm posterosuperior cortical retropulsion and mild nondisplaced fragmentation along the inferior upper plate. No other compression fractures are seen. There is osteopenia, degenerative changes of the spine and advanced L4-5 facet hypertrophy with grade 1 degenerative L4-5 spondylolisthesis. Mild hip DJD. IMPRESSION: 1. Acute superior endplate compression fracture of the L1 vertebral body with 4 mm posterosuperior cortical retropulsion. 2. No other acute trauma related findings are seen in the chest, abdomen or pelvis. 3. Trace pleural effusions. 4. 1 cm subpleural ground-glass nodule in the left upper lobe lingula, and 7 mm noncalcified nodules in the right middle lobe and left lower lobe. Initial follow-up with CT at 6 months is recommended to confirm persistence of the ground-glass nodule and stability of the 2 solid nodules. If persistent, repeat CT is recommended every 2 years until 5 years of stability has been established. This recommendation follows the consensus statement: Guidelines for Management of Incidental Pulmonary Nodules Detected on CT Images: From the Fleischner Society 2017; Radiology 2017; 284:228-243. 5. Aortic and coronary artery atherosclerosis, with dilatation to 4.2 cm in the aortic root and ascending segment. Recommend annual imaging followup by CTA or MRA. This recommendation follows 2010 ACCF/AHA/AATS/ACR/ASA/SCA/SCAI/SIR/STS/SVM Guidelines for the Diagnosis and Management of Patients with Thoracic Aortic Disease. Circulation. 2010; 121: G269-S854. Aortic aneurysm NOS (ICD10-I71.9) 6. Prominent pulmonary trunk indicating arterial hypertension, with mild cardiomegaly and a small pericardial effusion. 7.  Gastroenteritis, constipation and diverticulosis. 8. Nonobstructive nephrolithiasis. 9. Minimal pelvic ascites. 10. Moderately thickened rectal wall which could be due to proctitis, congestive or due to infiltrating disease. Further evaluation recommended. Aortic Atherosclerosis (ICD10-I70.0). Electronically Signed   By: Telford Nab M.D.   On: 01/27/2022 23:23   CT CERVICAL SPINE WO CONTRAST  Result Date: 01/27/2022 CLINICAL DATA:  Blunt polytrauma. Frequent falls past 5 weeks most recently today. Unable to get up after the most recent fall. EXAM: CT CERVICAL SPINE WITHOUT CONTRAST TECHNIQUE: Multidetector CT imaging of the cervical spine was performed without intravenous contrast. Multiplanar CT image reconstructions were also generated. RADIATION DOSE REDUCTION: This exam was performed according to the departmental dose-optimization program which includes automated exposure control, adjustment of the mA and/or kV according to patient size and/or use of iterative reconstruction technique. COMPARISON:  MRI cervical spine 07/19/2017. FINDINGS: Alignment: Straightened, with a minimal chronic discogenic retrolisthesis at C6-7, stable. Narrowing and osteophytes of the anterior atlantodental joint are again shown as well. Skull base and vertebrae: Osteopenia. No fracture is evident. No primary pathologic bone process apart from osteopenia. Soft tissues and spinal canal: No prevertebral fluid or swelling. No visible canal hematoma. Congenitally short pedicles in this patient reduce the effective AP diameter of the thecal sac, including at C1-2 where there also hypertrophic changes of the C1-2 joint and 7 mm AP thecal sac stenosis with slight cord compression. Both proximal cervical ICAs are heavily calcified and there are likely flow-limiting stenoses of both. There are calcifications in both distal vertebral arteries. There is no laryngeal mass. No thyroid nodule. Disc levels: The discs are completely collapsed  from C2-3 through C6-7, with chronic fusion  across C4-5 vertebral bodies, and bidirectional osteophytes. The C2-3 and C7-T1 discs are normal in heights. Posterior disc osteophyte complexes variably narrow the thecal sac but especially so C5-6 and C6-7 where there is spondylotic cord compression eccentric to the right at both levels with more significant spinal canal stenosis at these levels. There is partial effacement of the ventral CSF at C3-4 and C4-5 but without spondylotic cord compression. Facet joint and uncinate hypertrophy is seen at all levels. Resulting foraminal stenosis is severe on the left and moderate on the right at C2-3, severe on the left and moderate to severe on the right at C3-4, bilaterally moderate to severe at C4-5, bilaterally severe at C5-6 and bilaterally moderate to severe at C6-7. Upper chest: Negative. Other: None. IMPRESSION: 1. Osteopenia and degenerative change without evidence of fractures. 2. Congenitally short pedicles and degenerative disc changes with multilevel spondylotic cord encroachment, greatest at C5-6 and C6-7. 3. Straightened lordosis with chronic discogenic retrolisthesis C6-7. 4. 7 mm AP thecal sac stenosis at C1-2 with slight cord compression. 5. Both proximal cervical ICAs are heavily calcified and there are likely flow-limiting stenoses of both. Follow-up as indicated. Electronically Signed   By: Telford Nab M.D.   On: 01/27/2022 22:46   CT HEAD WO CONTRAST  Result Date: 01/27/2022 CLINICAL DATA:  Head trauma EXAM: CT HEAD WITHOUT CONTRAST TECHNIQUE: Contiguous axial images were obtained from the base of the skull through the vertex without intravenous contrast. RADIATION DOSE REDUCTION: This exam was performed according to the departmental dose-optimization program which includes automated exposure control, adjustment of the mA and/or kV according to patient size and/or use of iterative reconstruction technique. COMPARISON:  None Available. FINDINGS:  Brain: No evidence of acute infarction, hemorrhage, hydrocephalus, extra-axial collection or mass lesion/mass effect. There is mild diffuse atrophy and mild periventricular white matter hypodensity, likely chronic small vessel ischemic change. Vascular: Atherosclerotic calcifications are present within the cavernous internal carotid arteries. Skull: Normal. Negative for fracture or focal lesion. Sinuses/Orbits: No acute finding. Other: None. IMPRESSION: 1. No acute intracranial process. 2. Mild diffuse atrophy and mild chronic small vessel ischemic change. Electronically Signed   By: Ronney Asters M.D.   On: 01/27/2022 22:32   DG FEMUR, MIN 2 VIEWS RIGHT  Result Date: 01/27/2022 CLINICAL DATA:  Fall and pain in the right lower extremity. EXAM: RIGHT FEMUR 2 VIEWS COMPARISON:  None Available. FINDINGS: No acute fracture or dislocation. The bones are osteopenic. Severe arthritic changes of the right knee. Atherosclerotic calcification of the right lower extremity vasculature. The soft tissues are unremarkable. IMPRESSION: 1. No acute fracture or dislocation. 2. Severe arthritic changes of the right knee. Electronically Signed   By: Anner Crete M.D.   On: 01/27/2022 22:25     Assessment and Plan:   First and Second degree heart block presenting with fall and possible syncope on toprol xl 25 mg qd now on hold-current heart rate sinus brady in 60's with long first degree heart block. Allow toprol xl to wash out and monitor closely. Awaiting transfer to [facility] on hospitalist service. May need EP consult. Will follow.  CAD s/p taxus stent Cfx 2005 with residual 30-40% LAD and 60-65% LAD, 80% D2, OM 2 40-50%, pRCA 40-50%, mRCA 40-50% EF 50-55%. Denies angina. Toprol and amlodipine on hold. May need to restart amlodipine for BP control. On plavix and crestor  Carotid stenosis CT shows heavily calcified ICAs likely flow limiting-for [place] today.  A on CRF-Crt 2.31 on admission now 2.16  HTN-trending  up on  HCTZ 12.5 mg daily  DM2 per primary team  Closed compression fracture L1 vertebra   Risk Assessment/Risk Scores:      For questions or updates, please contact [place] Please consult [email or web address] for contact info under    Signed, Ermalinda Barrios, PA-C 01/28/2022 8:50 AM    Attending note:  Patient seen and examined.  I reviewed his records and discussed the case with Ms. Bonnell Public PA-C.  Patient was last seen by Dr. Percival [name] in 2020.  He has a known history of CAD managed medically, longstanding tobacco abuse, previously documented bradycardia and likely conduction system disease, CKD stage IIIb, and bilateral carotid artery disease.  Patient presents to the ER after recent fall, uncertain if frank syncope but does state that he did not have any clear warning.  This has happened recurrently over the last few weeks.  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  Does not describe any chest tightness or worsening shortness of breath over baseline.  No sense of palpitations.  On examination in the ER he reports no chest pain or shortness of breath, no dizziness.  Telemetry currently shows sinus rhythm with significantly prolonged PR interval and IVCD.  Lungs exhibit decreased breath sounds throughout.  He has bilateral carotid bruits.  Cardiac exam with distant, largely regular heart sounds and 2/6 systolic murmur.  Pertinent lab work includes potassium 4.0, BUN 62, creatinine 2.16, lactate 1.3, WBC 17.6, hemoglobin 13.5, platelets 207, hemoglobin A1c 5.5%.  I reviewed his recent tracings which show sinus rhythm with right bundle branch block, first-degree heart block with significantly prolonged PR interval, also second-degree heart block type I and cannot rule out type II completely.  Do not see complete heart block however.  He has been on Toprol-XL 25 mg daily at home.  Neuroimaging shows no acute findings.  He does have heavily calcified bilateral ICAs.  His  last carotid Dopplers were in 2020.  Patient awaits transfer to [facility] on the hospital service for further workup and management.  Cardiology consulted due to bradycardia and evidence of conduction system disease.  Recommend holding Toprol-XL for washout, observe heart rhythm on telemetry.  May need EP consultation particularly if he continues to show intermittent episodes of second-degree type II heart block or higher.  Obtain follow-up carotid Dopplers.  Hold outpatient Avalide and follow-up renal function.  May be able to resume Norvasc depending on blood pressure trend.  Otherwise continue Plavix and Crestor.  Check TSH and echocardiogram as well.  Our service will continue to follow at [facility].  Satira Sark, M.D., F.A.C.C.

## 2022-01-28 NOTE — ED Notes (Signed)
Patient is eating dinner with assistance of family member at bedside.

## 2022-01-28 NOTE — Progress Notes (Signed)
*  PRELIMINARY RESULTS* Echocardiogram 2D Echocardiogram has been performed.  Elpidio Anis 01/28/2022, 1:29 PM

## 2022-01-28 NOTE — ED Notes (Signed)
Pt had small dark bm smear in diaper, pt and linens cleaned. Nad. Daughter Donell called to speak with RN and was updated per permission by patient. Advised pt was worried about his wife, Donell stated her mom was fine. Advised pt his wife was with daughter and fine. Pt now talking with grand daughter Vicente Males whom pt states we "can tell her anything"

## 2022-01-28 NOTE — Assessment & Plan Note (Signed)
-  Holding metoprolol in the setting of heart block and bradycardia - Continue statin and Plavix

## 2022-01-28 NOTE — TOC Progression Note (Signed)
  Transition of Care [facility]) Screening Note   Patient Details  Name: [name] [name] Date of Birth: [birth year]   [facility]) CM/SW Contact:    Boneta Lucks, RN Phone Number: 01/28/2022, 10:09 AM    Transition of Care Department [facility]) has reviewed patient and no TOC needs have been identified at this time. We will continue to monitor patient advancement through interdisciplinary progression rounds. If new patient transition needs arise, please place a TOC consult.      Barriers to Discharge: Continued Medical Work up Presenter, broadcasting to [employer])

## 2022-01-28 NOTE — ED Notes (Addendum)
Received report from [employer]. Pt resting. Back brace on. A/o. Denies pain. Pt repositioned in bed. Cardiology at bedside at this time. Pedal pulses weak but present. No ble swelling present. Iv to right wrist intact with fluids infusing. Pt used urinal.

## 2022-01-29 DIAGNOSIS — N1832 Chronic kidney disease, stage 3b: Secondary | ICD-10-CM

## 2022-01-29 DIAGNOSIS — I6523 Occlusion and stenosis of bilateral carotid arteries: Secondary | ICD-10-CM | POA: Diagnosis not present

## 2022-01-29 DIAGNOSIS — N179 Acute kidney failure, unspecified: Secondary | ICD-10-CM | POA: Diagnosis not present

## 2022-01-29 DIAGNOSIS — G9341 Metabolic encephalopathy: Secondary | ICD-10-CM | POA: Insufficient documentation

## 2022-01-29 DIAGNOSIS — I441 Atrioventricular block, second degree: Secondary | ICD-10-CM | POA: Diagnosis not present

## 2022-01-29 DIAGNOSIS — S32010A Wedge compression fracture of first lumbar vertebra, initial encounter for closed fracture: Secondary | ICD-10-CM | POA: Diagnosis not present

## 2022-01-29 DIAGNOSIS — I459 Conduction disorder, unspecified: Secondary | ICD-10-CM | POA: Diagnosis not present

## 2022-01-29 LAB — BASIC METABOLIC PANEL
Anion gap: 13 (ref 5–15)
BUN: 50 mg/dL — ABNORMAL HIGH (ref 8–23)
CO2: 19 mmol/L — ABNORMAL LOW (ref 22–32)
Calcium: 9 mg/dL (ref 8.9–10.3)
Chloride: 107 mmol/L (ref 98–111)
Creatinine, Ser: 1.78 mg/dL — ABNORMAL HIGH (ref 0.61–1.24)
GFR, Estimated: 36 mL/min — ABNORMAL LOW (ref >60)
Glucose, Bld: 123 mg/dL — ABNORMAL HIGH (ref 70–99)
Potassium: 3.5 mmol/L (ref 3.5–5.1)
Sodium: 139 mmol/L (ref 135–145)

## 2022-01-29 LAB — CBC
HCT: 41.3 % (ref 39.0–52.0)
Hemoglobin: 13.5 g/dL (ref 13.0–17.0)
MCH: 30.2 pg (ref 26.0–34.0)
MCHC: 32.7 g/dL (ref 30.0–36.0)
MCV: 92.4 fL (ref 80.0–100.0)
Platelets: 204 10*3/uL (ref 150–400)
RBC: 4.47 MIL/uL (ref 4.22–5.81)
RDW: 13.2 % (ref 11.5–15.5)
WBC: 14.1 10*3/uL — ABNORMAL HIGH (ref 4.0–10.5)
nRBC: 0 % (ref 0.0–0.2)

## 2022-01-29 LAB — GLUCOSE, CAPILLARY: Glucose-Capillary: 115 mg/dL — ABNORMAL HIGH (ref 70–99)

## 2022-01-29 LAB — CBG MONITORING, ED
Glucose-Capillary: 111 mg/dL — ABNORMAL HIGH (ref 70–99)
Glucose-Capillary: 117 mg/dL — ABNORMAL HIGH (ref 70–99)

## 2022-01-29 LAB — MAGNESIUM: Magnesium: 1.8 mg/dL (ref 1.7–2.4)

## 2022-01-29 LAB — FOLATE: Folate: 8.4 ng/mL (ref >5.9)

## 2022-01-29 LAB — VITAMIN B12: Vitamin B-12: 580 pg/mL (ref 180–914)

## 2022-01-29 MED ORDER — MIDAZOLAM HCL 2 MG/2ML IJ SOLN
INTRAMUSCULAR | Status: AC
  Administered 2022-01-29: 1 mg via INTRAVENOUS
  Filled 2022-01-29: qty 2

## 2022-01-29 MED ORDER — NICOTINE 21 MG/24HR TD PT24
21.0000 mg | MEDICATED_PATCH | Freq: Every day | TRANSDERMAL | Status: DC
  Administered 2022-01-29 – 2022-02-06 (×9): 21 mg via TRANSDERMAL
  Filled 2022-01-29 (×9): qty 1

## 2022-01-29 MED ORDER — MIDAZOLAM HCL 2 MG/2ML IJ SOLN
1.0000 mg | INTRAMUSCULAR | Status: DC | PRN
  Administered 2022-01-29 (×2): 1 mg via INTRAVENOUS
  Filled 2022-01-29 (×3): qty 2

## 2022-01-29 MED ORDER — INSULIN ASPART 100 UNIT/ML IJ SOLN
0.0000 [IU] | Freq: Three times a day (TID) | INTRAMUSCULAR | Status: DC
  Administered 2022-01-29 – 2022-02-02 (×3): 1 [IU] via SUBCUTANEOUS
  Administered 2022-02-03: 2 [IU] via SUBCUTANEOUS
  Administered 2022-02-05: 1 [IU] via SUBCUTANEOUS
  Administered 2022-02-05: 2 [IU] via SUBCUTANEOUS
  Administered 2022-02-06: 3 [IU] via SUBCUTANEOUS
  Filled 2022-01-29: qty 1

## 2022-01-29 MED ORDER — MIDAZOLAM HCL 2 MG/2ML IJ SOLN
0.5000 mg | INTRAMUSCULAR | Status: DC | PRN
  Administered 2022-01-29: 0.5 mg via INTRAVENOUS
  Filled 2022-01-29: qty 2

## 2022-01-29 MED ORDER — HALOPERIDOL LACTATE 5 MG/ML IJ SOLN: 5.0000 mg | Freq: Once | INTRAMUSCULAR | Status: DC | PRN

## 2022-01-29 MED ORDER — HALOPERIDOL LACTATE 5 MG/ML IJ SOLN
5.0000 mg | Freq: Four times a day (QID) | INTRAMUSCULAR | Status: DC | PRN
  Administered 2022-01-29 – 2022-02-04 (×5): 5 mg via INTRAVENOUS
  Filled 2022-01-29 (×5): qty 1

## 2022-01-29 MED ORDER — INSULIN ASPART 100 UNIT/ML IJ SOLN: 0.0000 [IU] | Freq: Every day | INTRAMUSCULAR | Status: DC

## 2022-01-29 NOTE — ED Notes (Signed)
Patient's daughter updated over the phone with HIPAA compliant update.

## 2022-01-29 NOTE — ED Notes (Signed)
Family at bedside. 

## 2022-01-29 NOTE — ED Notes (Signed)
Pt lying in bed, seems to be responding to internal stimuli, as he is having a normal-toned conversation and no one is in the room with him

## 2022-01-29 NOTE — ED Notes (Signed)
Pt agitated and restless on bed. All blankets in floor and pt has take back brace off. Pt unable to be consoled.

## 2022-01-29 NOTE — ED Notes (Signed)
In to give meds. Pt combative and agitated. Dr Tat aware versed 0.'5mg'$  not helping.

## 2022-01-29 NOTE — ED Notes (Signed)
Patient continues to remove TLSO brace and continues to throw blankets in the floor and attempt to put legs over the railing.  Patient swats at this RN when attempting to redirect. Curtains opened to maintain being able to see patient from nurses station.

## 2022-01-29 NOTE — ED Notes (Signed)
Per daughter [name] who just called,  pt gets confused and hallucinates when he receives narcotics. She also stated she has not been updated with any test results. Dr Tat aware. She also requested nicotine patch.

## 2022-01-29 NOTE — ED Notes (Signed)
Pt talking about his tractor running and needing to park his car.

## 2022-01-29 NOTE — ED Notes (Signed)
Patient awake, continues to fidget with monitoring equipment and IV.  Patient also continues to try and get OOB.  Patient redirectable but confused at this time.

## 2022-01-29 NOTE — ED Notes (Signed)
Patient lying in bed, eyes closed.  Respirations even and unlabored.  Family member remains at bedside.  Patient in NAD at this time.

## 2022-01-29 NOTE — ED Notes (Signed)
Pt restful at this time, however periodically yells out about his tractor and/or his car. Pt also appears to be verbally  responding to internal stimuli

## 2022-01-29 NOTE — Progress Notes (Addendum)
PROGRESS NOTE  [name] [name] VFI:[id number] DOB: 03/29/1932 DOA: 01/27/2022 PCP: Prince Solian, MD  Brief History:   89 y.o. male with medical history significant of coronary artery disease, carotid stenosis, diabetes mellitus, hypertension, peptic ulcer disease, right bundle branch block, and more presents the ED with a chief complaint of fall.  Patient does not know how he fell.  Daughter at bedside provided most the history.  She reports that he was ambulating with his walker and he said he was moving a box out of the way and fell down.  She did not see the fall, but when she got to him immediately after he had abrasion on the back of his head, and could not get up.  Pt cannot clarify if he had LOC or not.  He was not complaining of chest pain or palpitations according to the daughter.  He had a similar fall a week PTA when the caregiver was there.  He was sore but seemed okay, but started complaining more more of low back pain since that first fall.  On the day of admission, pt had trouble getting up due to pain in his back. For the falls he had incontinence of bowel and bladder which appears to be a chronic issue.   Daughter reports that she has noticed a functional decline in him over the past few months.  She says that he has been more fatigued.  Pt was seen by cardiology who felt the patient had second-degree AV block type I, but could not rule out type II second-degree AV block.  As result, patient is being transferred to [facility] for EP evaluation and for cardiology to follow.     Assessment/Plan: Second-degree AV block -Appreciate cardiology consult -Cannot rule out type II second-degree AV block -Remain on telemetry -Holding metoprolol -Optimize electrolytes  Acute metabolic encephalopathy -Patient has developed some hospital delirium since admission -He is redirectable -daughter endorses cognitive functional decline over last 6 months -In part due to acute on chronic  renal failure -Check B12 -TSH 0.978 -UA negative for pyuria -CT brain negative for acute findings  Close compression fracture L1 -CT lumbar spine shows L1 fracture with 30-40% height loss -TLSO brace -judicious opioids -PT evaluation  Acute on chronic renal failure--CKD stage IIIb -Baseline creatinine 1.4-1.6 -Serum creatinine peaked 2.31 -Continue IV fluids -Holding irbesartan/HCTZ  Right Carotid stenosis -01/27/22 Carotid ultrasound--R ICA with 70-99% stenosis -11/08/2018 US carotid--R-ICA 60-79% stenosis -discussed with [facility]--Dr. Ala Bent will see after arriva -[facility]VS requested a call once pt arrives  Essential hypertension -Continue amlodipine -Holding metoprolol secondary to heart block -Holding irbesartan secondary to acute kidney injury  Controlled diabetes mellitus type 2  -01/27/2022 hemoglobin A1c 5.5 -NovoLog sliding scale -Continue reduced dose Levemir  Coronary artery disease -No chest pain presently -Continue Plavix -Continue Crestor  Tobacco abuse -Tobacco cessation discussed -nicoderm patch  COPD -Stable on room air  Mixed hyperlipidemia -Continue statin  Cervical spine stenosis -01/27/2022 CT cervical spine--multilevel DJD; 7 mm AP thecal sac stenosis C1-2>>appears unchanged from prior MRI C-spine 07/19/17 -Patient denies any neck pain -no extremity weakness       Family Communication:   daughter updated 01/29/22  Consultants:  VVS, Dr. Trula Slade, Cards  Code Status:  FULL   DVT Prophylaxis:  [place] Heparin    Procedures: As Listed in Progress Note Above  Antibiotics: None      Subjective: Patient denies fevers, chills, headache, chest pain, dyspnea, nausea, vomiting,  diarrhea, abdominal pain, dysuria, hematuria, hematochezia, and melena.   Objective: Vitals:   01/28/22 2000 01/28/22 2310 01/29/22 0030 01/29/22 0600  BP: (!) 168/67 (!) 165/91 (!) 178/75 (!) 153/105  Pulse: 74 60 68 75  Resp: (!) 24 (!) 22 19 (!) 24  Temp:   98 F (36.7 C)  98.2 F (36.8 C)  TempSrc:  Axillary    SpO2: 96% 96% 97% 98%  Weight:      Height:        Intake/Output Summary (Last 24 hours) at 01/29/2022 0721 Last data filed at 01/29/2022 0358 Gross per 24 hour  Intake --  Output 900 ml  Net -900 ml   Weight change:  Exam:  General:  Pt is alert, follows commands appropriately, not in acute distress HEENT: No icterus, No thrush, No neck mass, [place]/AT Cardiovascular: RRR, S1/S2, no rubs, no gallops Respiratory: CTA bilaterally, no wheezing, no crackles, no rhonchi Abdomen: Soft/+BS, non tender, non distended, no guarding Extremities: No edema, No lymphangitis, No petechiae, No rashes, no synovitis Neuro:  CN II-XII intact, strength 4/5 in RUE, RLE, strength 4/5 LUE, LLE; sensation intact bilateral; no dysmetria; babinski equivocal    Data Reviewed: I have personally reviewed following labs and imaging studies Basic Metabolic Panel: Recent Labs  Lab 01/27/22 1947 01/28/22 0503  NA 135 135  K 3.4* 4.0  CL 101 106  CO2 22 20*  GLUCOSE 131* 106*  BUN 59* 62*  CREATININE 2.31* 2.16*  CALCIUM 8.9 8.6*  MG 1.8  1.8  --    Liver Function Tests: Recent Labs  Lab 01/27/22 1947 01/28/22 0503  AST 17 14*  ALT 12 10  ALKPHOS 61 50  BILITOT 0.7 0.8  PROT 6.9 6.1*  ALBUMIN 3.6 3.1*   No results for input(s): "LIPASE", "AMYLASE" in the last 168 hours. No results for input(s): "AMMONIA" in the last 168 hours. Coagulation Profile: Recent Labs  Lab 01/27/22 1947  INR 1.1   CBC: Recent Labs  Lab 01/27/22 1947  WBC 17.6*  HGB 13.5  HCT 41.0  MCV 92.6  PLT 207   Cardiac Enzymes: Recent Labs  Lab 01/27/22 1947  CKTOTAL 97   BNP: Invalid input(s): "POCBNP" CBG: Recent Labs  Lab 01/28/22 0758 01/28/22 1122 01/28/22 1707 01/28/22 2221  GLUCAP 97 98 159* 120*   HbA1C: Recent Labs    01/27/22 1947  HGBA1C 5.5   Urine analysis:    Component Value Date/Time   COLORURINE YELLOW 01/27/2022 1745    APPEARANCEUR HAZY (A) 01/27/2022 1745   LABSPEC 1.017 01/27/2022 1745   PHURINE 5.0 01/27/2022 1745   GLUCOSEU NEGATIVE 01/27/2022 1745   HGBUR MODERATE (A) 01/27/2022 1745   BILIRUBINUR NEGATIVE 01/27/2022 1745   KETONESUR NEGATIVE 01/27/2022 1745   PROTEINUR 100 (A) 01/27/2022 1745   UROBILINOGEN 0.2 01/02/2011 1459   NITRITE NEGATIVE 01/27/2022 1745   LEUKOCYTESUR NEGATIVE 01/27/2022 1745   Sepsis Labs: '@LABRCNTIP'$ (procalcitonin:4,lacticidven:4) )No results found for this or any previous visit (from the past 240 hour(s)).   Scheduled Meds:  amLODipine  5 mg Oral Daily   clopidogrel  75 mg Oral Daily   escitalopram  10 mg Oral Daily   heparin  5,000 Units Subcutaneous Q8H   insulin aspart  0-15 Units Subcutaneous TID WC   insulin aspart  0-5 Units Subcutaneous QHS   insulin detemir  7 Units Subcutaneous QHS   lidocaine  1 patch Transdermal Q24H   pantoprazole  40 mg Oral Daily   rosuvastatin  5  mg Oral Daily   Continuous Infusions:  sodium chloride 35 mL/hr at 01/28/22 1609    Procedures/Studies: ECHOCARDIOGRAM COMPLETE  Result Date: 01/28/2022    ECHOCARDIOGRAM REPORT   Patient Name:   [name] [name] Date of Exam: 01/28/2022 Medical Rec #:  [id number]    Height:       63.0 in Accession #:    [id number]   Weight:       165.0 lb Date of Birth:  [birth year]     BSA:          1.782 m Patient Age:    42 years     BP:           144/79 mmHg Patient Gender: M            HR:           68 bpm. Exam Location:  [facility] Procedure: 2D Echo, Cardiac Doppler and Color Doppler Indications:    Syncope  History:        Patient has no prior history of Echocardiogram examinations.                 CAD, COPD; Risk Factors:Hypertension, Diabetes and Dyslipidemia.  Sonographer:    Wenda Low Referring Phys: Gregory Comments: No subcostal window and Technically difficult study due to poor echo windows. Image acquisition challenging due to COPD. IMPRESSIONS  1. Left  ventricular ejection fraction, by estimation, is 65 to 70%. The left ventricle has normal function. The left ventricle has no regional wall motion abnormalities. There is mild concentric left ventricular hypertrophy. Left ventricular diastolic parameters are indeterminate.  2. Right ventricular systolic function is normal. The right ventricular size is normal. Tricuspid regurgitation signal is inadequate for assessing PA pressure.  3. Left atrial size was mild to moderately dilated.  4. The mitral valve is grossly normal, mildly calcified. Trivial mitral valve regurgitation.  5. The aortic valve is tricuspid. There is mild calcification of the aortic valve. Aortic valve regurgitation is not visualized. Aortic valve sclerosis is present, with no evidence of aortic valve stenosis. Aortic valve mean gradient measures 3.0 mmHg.  6. Aortic dilatation noted. There is mild dilatation of the aortic root, measuring 42 mm. There is mild dilatation of the ascending aorta, measuring 39 mm.  7. Unable to estimate CVP. Comparison(s): No prior Echocardiogram. FINDINGS  Left Ventricle: Left ventricular ejection fraction, by estimation, is 65 to 70%. The left ventricle has normal function. The left ventricle has no regional wall motion abnormalities. The left ventricular internal cavity size was normal in size. There is  mild concentric left ventricular hypertrophy. Left ventricular diastolic parameters are indeterminate. Right Ventricle: The right ventricular size is normal. No increase in right ventricular wall thickness. Right ventricular systolic function is normal. Tricuspid regurgitation signal is inadequate for assessing PA pressure. Left Atrium: Left atrial size was mild to moderately dilated. Right Atrium: Right atrial size was normal in size. Pericardium: There is no evidence of pericardial effusion. Mitral Valve: The mitral valve is grossly normal. There is mild calcification of the mitral valve leaflet(s). Trivial mitral  valve regurgitation. MV peak gradient, 6.9 mmHg. The mean mitral valve gradient is 2.0 mmHg. Tricuspid Valve: The tricuspid valve is grossly normal. Tricuspid valve regurgitation is trivial. Aortic Valve: The aortic valve is tricuspid. There is mild calcification of the aortic valve. There is mild aortic valve annular calcification. Aortic valve regurgitation is not visualized. Aortic valve sclerosis is present, with no  evidence of aortic valve stenosis. Aortic valve mean gradient measures 3.0 mmHg. Aortic valve peak gradient measures 5.1 mmHg. Aortic valve area, by VTI measures 2.72 cm. Pulmonic Valve: The pulmonic valve was grossly normal. Pulmonic valve regurgitation is trivial. Aorta: Aortic dilatation noted. There is mild dilatation of the aortic root, measuring 42 mm. There is mild dilatation of the ascending aorta, measuring 39 mm. Venous: Unable to estimate CVP. The inferior vena cava was not well visualized. IAS/Shunts: No atrial level shunt detected by color flow Doppler.  LEFT VENTRICLE PLAX 2D LVIDd:         4.60 cm   Diastology LVIDs:         3.30 cm   LV e' medial:    14.10 cm/s LV PW:         1.10 cm   LV E/e' medial:  4.2 LV IVS:        1.20 cm   LV e' lateral:   16.10 cm/s LVOT diam:     2.00 cm   LV E/e' lateral: 3.7 LV SV:         75 LV SV Index:   42 LVOT Area:     3.14 cm  LEFT ATRIUM             Index        RIGHT ATRIUM           Index LA diam:        4.00 cm 2.24 cm/m   RA Area:     21.10 cm LA Vol (A2C):   79.5 ml 44.62 ml/m  RA Volume:   56.20 ml  31.54 ml/m LA Vol (A4C):   64.9 ml 36.42 ml/m LA Biplane Vol: 72.2 ml 40.52 ml/m  AORTIC VALVE AV Area (Vmax):    2.78 cm AV Area (Vmean):   2.38 cm AV Area (VTI):     2.72 cm AV Vmax:           113.00 cm/s AV Vmean:          74.600 cm/s AV VTI:            0.275 m AV Peak Grad:      5.1 mmHg AV Mean Grad:      3.0 mmHg LVOT Vmax:         99.90 cm/s LVOT Vmean:        56.600 cm/s LVOT VTI:          0.238 m LVOT/AV VTI ratio: 0.87  AORTA  Ao Root diam: 4.20 cm Ao Asc diam:  3.90 cm MITRAL VALVE MV Area (PHT): 2.75 cm     SHUNTS MV Area VTI:   2.33 cm     Systemic VTI:  0.24 m MV Peak grad:  6.9 mmHg     Systemic Diam: 2.00 cm MV Mean grad:  2.0 mmHg MV Vmax:       1.31 m/s MV Vmean:      61.7 cm/s MV Decel Time: 276 msec MV E velocity: 59.10 cm/s MV A velocity: 100.00 cm/s MV E/A ratio:  0.59 Rozann Lesches MD Electronically signed by Rozann Lesches MD Signature Date/Time: 01/28/2022/1:50:31 PM    Final    US Carotid Bilateral  Result Date: 01/28/2022 CLINICAL DATA:  89 year old male with a history of carotid stenosis EXAM: BILATERAL CAROTID DUPLEX ULTRASOUND TECHNIQUE: Pearline Cables scale imaging, color Doppler and duplex ultrasound were performed of bilateral carotid and vertebral arteries in the neck. COMPARISON:  None Available. FINDINGS: Criteria:  Quantification of carotid stenosis is based on velocity parameters that correlate the residual internal carotid diameter with NASCET-based stenosis levels, using the diameter of the distal internal carotid lumen as the denominator for stenosis measurement. The following velocity measurements were obtained: RIGHT ICA:  Systolic 237 cm/sec, Diastolic 44 cm/sec CCA:  55 cm/sec SYSTOLIC ICA/CCA RATIO:  4.7 ECA:  201 cm/sec LEFT ICA:  Systolic 91 cm/sec, Diastolic 19 cm/sec CCA:  74 cm/sec SYSTOLIC ICA/CCA RATIO:  1.4 ECA:  100 cm/sec Right Brachial SBP: Not acquired Left Brachial SBP: Not acquired RIGHT CAROTID ARTERY: No significant calcifications of the right common carotid artery. Intermediate waveform maintained. Moderate heterogeneous and partially calcified plaque at the right carotid bifurcation. Shadowing present. Spectral broadening. Low resistance waveform of the right ICA. Some tortuosity. RIGHT VERTEBRAL ARTERY: Antegrade flow with low resistance waveform. LEFT CAROTID ARTERY: No significant calcifications of the left common carotid artery. Intermediate waveform maintained. Moderate heterogeneous  and partially calcified plaque at the left carotid bifurcation. Lumen shadowing. Low resistance waveform of the left ICA. Tortuosity. LEFT VERTEBRAL ARTERY:  Antegrade flow with low resistance waveform. IMPRESSION: Right: Heterogeneous and partially calcified plaque at the right carotid bifurcation contributes to 70%-99% stenosis by established duplex criteria. Left: Color duplex indicates moderate heterogeneous and calcified plaque, with no hemodynamically significant stenosis by duplex criteria in the extracranial cerebrovascular circulation. Signed, Dulcy Fanny. Nadene Rubins, RPVI [facility] Radiology Electronically Signed   By: Corrie Mckusick D.O.   On: 01/28/2022 10:27   CT L-SPINE NO CHARGE  Result Date: 01/27/2022 CLINICAL DATA:  Fall with back pain. EXAM: CT Thoracic and Lumbar spine without contrast TECHNIQUE: Multidetector CT imaging of the thoracic and lumbar spine was performed without intravenous contrast. Multiplanar CT image reconstructions were also generated. RADIATION DOSE REDUCTION: This exam was performed according to the departmental dose-optimization program which includes automated exposure control, adjustment of the mA and/or kV according to patient size and/or use of iterative reconstruction technique. CONTRAST:  None. COMPARISON:  Last AP and lateral chest was 01/02/2011, last CT abdomen and pelvis with reconstructions was 02/21/2018. FINDINGS: CT THORACIC SPINE FINDINGS Segmentation: There are 12 rib-bearing thoracic type segments. Alignment: There is mild thoracic kyphodextroscoliosis without AP listhesis. Vertebrae: There is osteopenia without evidence of acute compression fracture. There is intact right anterolateral bridging enthesopathy multiple levels starting at T5 entering at T11. Spondylosis at all levels. No fracture or primary or pathologic process is seen apart from osteopenia. Paraspinal and other soft tissues: No acute  findings. No fracture of the visible posterior ribs is seen. Disc levels: The discs are partially degenerated and middle 1/3 of the thoracic spine. The greatest disc space loss, with vacuum phenomenon noted at T7-8. There is a posterior disc osteophyte complex at this level partially effacing the ventral CSF but no frank cord compression is evident. Without contrast no significant soft tissue or bony encroachment on the thecal sac is seen elsewhere. Hypertrophic facets impress on the dorsolateral thecal sac at T9-10 and T10-11 but do not compress the cord as far as seen. The foramina are moderately stenotic at T7-8, left-greater-than-right but are not stenotic at the remaining thoracic levels despite mild facet spurs. CT LUMBAR SPINE FINDINGS Segmentation: 5 lumbar type vertebrae. Alignment: There is a grade 1 degenerative spondylolisthesis at L4-5, and a minimal chronic L5-S1 degenerative retrolisthesis otherwise normal alignment, with anterolisthesis due to advanced facet hypertrophy at L4-5. Vertebrae: There is osteopenia. There is acute upper plate anterior wedge compression fracture  deformity of the L1 vertebral body with nondisplaced fragmentation along the anterior upper plate, anterior vertebral height loss 30-40%, posterior height loss 20-25%, and retropulsion of the posterosuperior cortex up to 4 mm mildly effacing the ventral CSF. A linear transverse fracture line is noted underlying the compressed L1 upper plate. The pedicles and posterior elements are intact. There is no other evidence of fractures. Paraspinal and other soft tissues: The aorta, iliac arteries and visceral branch arteries are heavily calcified. There is no AAA. There is no paraspinal hematoma. There is some paraspinal edema noted alongside L1. Disc levels: There is mild flattening of the ventral thecal sac just below the level of T12-L1 due to the posterosuperior L1 cortical retropulsion. There is no significant spinal canal stenosis.  There is a mild nonstenosing posterior disc bulge at T12-L1. Normal disc height. The foramina are clear. L1-2: Moderate disc space loss. Bidirectional osteophytes are noted with mild spinal stenosis a posterior disc osteophyte complex. There is right-greater-than-left facet hypertrophy with severe right and moderate left foraminal stenosis. L2-3: The disc is normal in height. There are mild endplate spurs. There is mild spinal canal stenosis due to a diffuse disc bulge, ligamentous and facet hypertrophy. Mild-to-moderate right and mild left foraminal stenosis. L3-4: This disc is normal in height. There are small endplate spurs. Similar to L2 there is mild spinal canal stenosis due to dorsal ligamentous and facet hypertrophy and a broad posterior disc bulge. There is mild foraminal stenosis. L4-5: There is mild disc space loss. Diffuse annular bulge is seen. There are small endplate osteophytes. There is severe spinal canal stenosis due to advanced facet hypertrophy and dorsal ligamentous thickening. Vertical foraminal stenosis is seen with bilateral moderate to severe foraminal stenosis. L5-S1: The disc is normal in height. Broad posterior disc bulge is seen mildly compressing the S1 nerve roots. There is mild spinal canal stenosis. There is mild facet hypertrophy with mild foraminal stenosis. Other: The SI joints are patent. No sacral insufficiency fracture seen. There is spurring of both anterior SI joints. IMPRESSION: 1. Osteopenia and degenerative change of the thoracic spine without evidence of thoracic spine fractures. 2. Mild thoracic kyphodextroscoliosis. 3. Lumbar spine CT: Acute upper plate anterior wedge compression fracture of the L1 vertebral body with nondisplaced fragmentation along the anterior upper plate, 30-40% anterior vertebral height loss, 20-25% posterior height loss, and 4 mm retropulsion of the posterosuperior cortex without significant spinal canal stenosis. 4. Osteopenia and degenerative  change without evidence of further lumbar spine fractures. 5. Severe acquired spinal canal stenosis at L4-5 with mild spinal canal stenosis at L2-3 and L3-4 and L5-S1. 6. Multilevel lumbar foraminal stenosis greatest on the right at L1-2 and bilaterally at L4-5. 7. Aortic and branch vessel atherosclerosis. Aortic Atherosclerosis (ICD10-I70.0). Electronically Signed   By: Telford Nab M.D.   On: 01/27/2022 23:58   CT T-SPINE NO CHARGE  Result Date: 01/27/2022 CLINICAL DATA:  Fall with back pain. EXAM: CT Thoracic and Lumbar spine without contrast TECHNIQUE: Multidetector CT imaging of the thoracic and lumbar spine was performed without intravenous contrast. Multiplanar CT image reconstructions were also generated. RADIATION DOSE REDUCTION: This exam was performed according to the departmental dose-optimization program which includes automated exposure control, adjustment of the mA and/or kV according to patient size and/or use of iterative reconstruction technique. CONTRAST:  None. COMPARISON:  Last AP and lateral chest was 01/02/2011, last CT abdomen and pelvis with reconstructions was 02/21/2018. FINDINGS: CT THORACIC SPINE FINDINGS Segmentation: There are 12 rib-bearing thoracic type segments.  Alignment: There is mild thoracic kyphodextroscoliosis without AP listhesis. Vertebrae: There is osteopenia without evidence of acute compression fracture. There is intact right anterolateral bridging enthesopathy multiple levels starting at T5 entering at T11. Spondylosis at all levels. No fracture or primary or pathologic process is seen apart from osteopenia. Paraspinal and other soft tissues: No acute findings. No fracture of the visible posterior ribs is seen. Disc levels: The discs are partially degenerated and middle 1/3 of the thoracic spine. The greatest disc space loss, with vacuum phenomenon noted at T7-8. There is a posterior disc osteophyte complex at this level partially effacing the ventral CSF but no  frank cord compression is evident. Without contrast no significant soft tissue or bony encroachment on the thecal sac is seen elsewhere. Hypertrophic facets impress on the dorsolateral thecal sac at T9-10 and T10-11 but do not compress the cord as far as seen. The foramina are moderately stenotic at T7-8, left-greater-than-right but are not stenotic at the remaining thoracic levels despite mild facet spurs. CT LUMBAR SPINE FINDINGS Segmentation: 5 lumbar type vertebrae. Alignment: There is a grade 1 degenerative spondylolisthesis at L4-5, and a minimal chronic L5-S1 degenerative retrolisthesis otherwise normal alignment, with anterolisthesis due to advanced facet hypertrophy at L4-5. Vertebrae: There is osteopenia. There is acute upper plate anterior wedge compression fracture deformity of the L1 vertebral body with nondisplaced fragmentation along the anterior upper plate, anterior vertebral height loss 30-40%, posterior height loss 20-25%, and retropulsion of the posterosuperior cortex up to 4 mm mildly effacing the ventral CSF. A linear transverse fracture line is noted underlying the compressed L1 upper plate. The pedicles and posterior elements are intact. There is no other evidence of fractures. Paraspinal and other soft tissues: The aorta, iliac arteries and visceral branch arteries are heavily calcified. There is no AAA. There is no paraspinal hematoma. There is some paraspinal edema noted alongside L1. Disc levels: There is mild flattening of the ventral thecal sac just below the level of T12-L1 due to the posterosuperior L1 cortical retropulsion. There is no significant spinal canal stenosis. There is a mild nonstenosing posterior disc bulge at T12-L1. Normal disc height. The foramina are clear. L1-2: Moderate disc space loss. Bidirectional osteophytes are noted with mild spinal stenosis a posterior disc osteophyte complex. There is right-greater-than-left facet hypertrophy with severe right and moderate  left foraminal stenosis. L2-3: The disc is normal in height. There are mild endplate spurs. There is mild spinal canal stenosis due to a diffuse disc bulge, ligamentous and facet hypertrophy. Mild-to-moderate right and mild left foraminal stenosis. L3-4: This disc is normal in height. There are small endplate spurs. Similar to L2 there is mild spinal canal stenosis due to dorsal ligamentous and facet hypertrophy and a broad posterior disc bulge. There is mild foraminal stenosis. L4-5: There is mild disc space loss. Diffuse annular bulge is seen. There are small endplate osteophytes. There is severe spinal canal stenosis due to advanced facet hypertrophy and dorsal ligamentous thickening. Vertical foraminal stenosis is seen with bilateral moderate to severe foraminal stenosis. L5-S1: The disc is normal in height. Broad posterior disc bulge is seen mildly compressing the S1 nerve roots. There is mild spinal canal stenosis. There is mild facet hypertrophy with mild foraminal stenosis. Other: The SI joints are patent. No sacral insufficiency fracture seen. There is spurring of both anterior SI joints. IMPRESSION: 1. Osteopenia and degenerative change of the thoracic spine without evidence of thoracic spine fractures. 2. Mild thoracic kyphodextroscoliosis. 3. Lumbar spine CT: Acute upper  plate anterior wedge compression fracture of the L1 vertebral body with nondisplaced fragmentation along the anterior upper plate, 30-40% anterior vertebral height loss, 20-25% posterior height loss, and 4 mm retropulsion of the posterosuperior cortex without significant spinal canal stenosis. 4. Osteopenia and degenerative change without evidence of further lumbar spine fractures. 5. Severe acquired spinal canal stenosis at L4-5 with mild spinal canal stenosis at L2-3 and L3-4 and L5-S1. 6. Multilevel lumbar foraminal stenosis greatest on the right at L1-2 and bilaterally at L4-5. 7. Aortic and branch vessel atherosclerosis. Aortic  Atherosclerosis (ICD10-I70.0). Electronically Signed   By: Telford Nab M.D.   On: 01/27/2022 23:58   CT CHEST ABDOMEN PELVIS WO CONTRAST  Result Date: 01/27/2022 CLINICAL DATA:  Frequent falls, most recently today. EXAM: CT CHEST, ABDOMEN AND PELVIS WITHOUT CONTRAST TECHNIQUE: Multidetector CT imaging of the chest, abdomen and pelvis was performed following the standard protocol without IV contrast. RADIATION DOSE REDUCTION: This exam was performed according to the departmental dose-optimization program which includes automated exposure control, adjustment of the mA and/or kV according to patient size and/or use of iterative reconstruction technique. COMPARISON:  CT abdomen and pelvis without contrast 02/21/2018, CT abdomen and pelvis 11/21/2017 with contrast. No prior chest CT. Nutrition chest x-ray was PA Lat 01/02/2011. FINDINGS: CT CHEST FINDINGS Cardiovascular: The heart is slightly enlarged. Coronary arteries are heavily calcified. There are patchy calcifications in the aorta with tortuosity in the descending segment and mild dilatation in the aortic root at the sinuses of Valsalva and in the ascending segment both of which measure 4.2 cm. The pulmonary trunk is prominent at 3.2 cm indicating arterial hypertension and there is a small pericardial effusion. The pulmonary veins are decompressed. There are scattered calcifications of the great vessels. Mediastinum/Nodes: No enlarged mediastinal, hilar, or axillary lymph nodes. Thyroid gland, trachea, and esophagus demonstrate no significant findings. There is a small amount of scattered retained secretions in the trachea. The main bronchi are clear. Lungs/Pleura: There are bilateral trace pleural effusions. There is no pneumothorax. No pulmonary contusion or confluent airspace infiltrate is seen. There is diffuse bronchial thickening without visible bronchial plugging. There are a few small scattered thin walled air cysts on the right, scattered linear  atelectasis in the posterior basal lower lobes, lingular base. Anteriorly in the left upper lobe lingula there is a subpleural ground-glass nodule on 4:69 measuring 1 cm. In the left lower lobe just posterior to the crest of the diaphragm there is a 7 mm noncalcified nodule on 4:90. Medially in the right middle lobe, there is a second nodule also measuring 7 mm on 4:74. Rest of the lung fields are clear, with no other visible nodules. Musculoskeletal: No thoracic spinal compression injury is seen. There is intact anterior bridging enthesopathy in the mid and lower thirds of the thoracic spine. There are multilevel healed fracture deformities of the left rib cage. No displaced acute rib fracture seen. Generalized osteopenia. The chest wall is unremarkable. CT ABDOMEN PELVIS FINDINGS Hepatobiliary: There is a calcified granuloma in the right lobe, 1 in the left lobe. No other focal liver abnormality is seen without contrast. Gallbladder is absent, with no biliary dilatation. Pancreas: Partially atrophic and otherwise unremarkable without contrast. Spleen: There are calcified granulomas. Chronic capsular retraction posteriorly suggesting scarring. No other focal splenic abnormality. No splenomegaly. Adrenals/Urinary Tract: No adrenal or renal mass or hemorrhage is seen without contrast. Small cyst in the inferior pole of the left kidney are unchanged. Mild generalized adrenal hyperplasia appears similar. There are  small scattered bilateral intrarenal caliceal stones and additional calcifications consistent with renovascular linear calcifications at both renal hila. No ureteral stones or hydronephrosis are seen. Bilateral perinephric stranding appears similar. Both kidneys are low normal in size. No bladder thickening is seen. Stomach/Bowel: Moderate thickened folds proximal to mid stomach. Mild fluid filling of some of the upper abdominal normal caliber small bowel seen possibly reflecting nonspecific enteritis. No  small bowel obstruction or inflammation is seen. The appendix is normal. There is mild to moderate retained stool in the ascending and transverse colon, left-sided diverticula most advanced in the sigmoid segment. No findings of acute diverticulitis are seen. The rectal wall is moderately thickened which could be due to proctitis, congestive or due to infiltrating disease. Further evaluation recommended. Vascular/Lymphatic: There is extensive aortoiliac and branch vessel atherosclerosis without AAA. No adenopathy is seen. Reproductive: No prostatomegaly. Other: Minimal presacral pelvic ascites and trace ascites distal left paracolic gutter. No free air, free hemorrhage or incarcerated hernias. Bilateral small inguinal fat hernias. Musculoskeletal: There is an acute superior endplate compression fracture of the L1 vertebral body. Loss of anterior height is 30-40%, loss of posterior height 20-25% with 4 mm posterosuperior cortical retropulsion and mild nondisplaced fragmentation along the inferior upper plate. No other compression fractures are seen. There is osteopenia, degenerative changes of the spine and advanced L4-5 facet hypertrophy with grade 1 degenerative L4-5 spondylolisthesis. Mild hip DJD. IMPRESSION: 1. Acute superior endplate compression fracture of the L1 vertebral body with 4 mm posterosuperior cortical retropulsion. 2. No other acute trauma related findings are seen in the chest, abdomen or pelvis. 3. Trace pleural effusions. 4. 1 cm subpleural ground-glass nodule in the left upper lobe lingula, and 7 mm noncalcified nodules in the right middle lobe and left lower lobe. Initial follow-up with CT at 6 months is recommended to confirm persistence of the ground-glass nodule and stability of the 2 solid nodules. If persistent, repeat CT is recommended every 2 years until 5 years of stability has been established. This recommendation follows the consensus statement: Guidelines for Management of Incidental  Pulmonary Nodules Detected on CT Images: From the Fleischner Society 2017; Radiology 2017; 284:228-243. 5. Aortic and coronary artery atherosclerosis, with dilatation to 4.2 cm in the aortic root and ascending segment. Recommend annual imaging followup by CTA or MRA. This recommendation follows 2010 ACCF/AHA/AATS/ACR/ASA/SCA/SCAI/SIR/STS/SVM Guidelines for the Diagnosis and Management of Patients with Thoracic Aortic Disease. Circulation. 2010; 121: K270-W237. Aortic aneurysm NOS (ICD10-I71.9) 6. Prominent pulmonary trunk indicating arterial hypertension, with mild cardiomegaly and a small pericardial effusion. 7. Gastroenteritis, constipation and diverticulosis. 8. Nonobstructive nephrolithiasis. 9. Minimal pelvic ascites. 10. Moderately thickened rectal wall which could be due to proctitis, congestive or due to infiltrating disease. Further evaluation recommended. Aortic Atherosclerosis (ICD10-I70.0). Electronically Signed   By: Telford Nab M.D.   On: 01/27/2022 23:23   CT CERVICAL SPINE WO CONTRAST  Result Date: 01/27/2022 CLINICAL DATA:  Blunt polytrauma. Frequent falls past 5 weeks most recently today. Unable to get up after the most recent fall. EXAM: CT CERVICAL SPINE WITHOUT CONTRAST TECHNIQUE: Multidetector CT imaging of the cervical spine was performed without intravenous contrast. Multiplanar CT image reconstructions were also generated. RADIATION DOSE REDUCTION: This exam was performed according to the departmental dose-optimization program which includes automated exposure control, adjustment of the mA and/or kV according to patient size and/or use of iterative reconstruction technique. COMPARISON:  MRI cervical spine 07/19/2017. FINDINGS: Alignment: Straightened, with a minimal chronic discogenic retrolisthesis at C6-7, stable. Narrowing and osteophytes of  the anterior atlantodental joint are again shown as well. Skull base and vertebrae: Osteopenia. No fracture is evident. No primary pathologic  bone process apart from osteopenia. Soft tissues and spinal canal: No prevertebral fluid or swelling. No visible canal hematoma. Congenitally short pedicles in this patient reduce the effective AP diameter of the thecal sac, including at C1-2 where there also hypertrophic changes of the C1-2 joint and 7 mm AP thecal sac stenosis with slight cord compression. Both proximal cervical ICAs are heavily calcified and there are likely flow-limiting stenoses of both. There are calcifications in both distal vertebral arteries. There is no laryngeal mass. No thyroid nodule. Disc levels: The discs are completely collapsed from C2-3 through C6-7, with chronic fusion across C4-5 vertebral bodies, and bidirectional osteophytes. The C2-3 and C7-T1 discs are normal in heights. Posterior disc osteophyte complexes variably narrow the thecal sac but especially so C5-6 and C6-7 where there is spondylotic cord compression eccentric to the right at both levels with more significant spinal canal stenosis at these levels. There is partial effacement of the ventral CSF at C3-4 and C4-5 but without spondylotic cord compression. Facet joint and uncinate hypertrophy is seen at all levels. Resulting foraminal stenosis is severe on the left and moderate on the right at C2-3, severe on the left and moderate to severe on the right at C3-4, bilaterally moderate to severe at C4-5, bilaterally severe at C5-6 and bilaterally moderate to severe at C6-7. Upper chest: Negative. Other: None. IMPRESSION: 1. Osteopenia and degenerative change without evidence of fractures. 2. Congenitally short pedicles and degenerative disc changes with multilevel spondylotic cord encroachment, greatest at C5-6 and C6-7. 3. Straightened lordosis with chronic discogenic retrolisthesis C6-7. 4. 7 mm AP thecal sac stenosis at C1-2 with slight cord compression. 5. Both proximal cervical ICAs are heavily calcified and there are likely flow-limiting stenoses of both. Follow-up  as indicated. Electronically Signed   By: Telford Nab M.D.   On: 01/27/2022 22:46   CT HEAD WO CONTRAST  Result Date: 01/27/2022 CLINICAL DATA:  Head trauma EXAM: CT HEAD WITHOUT CONTRAST TECHNIQUE: Contiguous axial images were obtained from the base of the skull through the vertex without intravenous contrast. RADIATION DOSE REDUCTION: This exam was performed according to the departmental dose-optimization program which includes automated exposure control, adjustment of the mA and/or kV according to patient size and/or use of iterative reconstruction technique. COMPARISON:  None Available. FINDINGS: Brain: No evidence of acute infarction, hemorrhage, hydrocephalus, extra-axial collection or mass lesion/mass effect. There is mild diffuse atrophy and mild periventricular white matter hypodensity, likely chronic small vessel ischemic change. Vascular: Atherosclerotic calcifications are present within the cavernous internal carotid arteries. Skull: Normal. Negative for fracture or focal lesion. Sinuses/Orbits: No acute finding. Other: None. IMPRESSION: 1. No acute intracranial process. 2. Mild diffuse atrophy and mild chronic small vessel ischemic change. Electronically Signed   By: Ronney Asters M.D.   On: 01/27/2022 22:32   DG FEMUR, MIN 2 VIEWS RIGHT  Result Date: 01/27/2022 CLINICAL DATA:  Fall and pain in the right lower extremity. EXAM: RIGHT FEMUR 2 VIEWS COMPARISON:  None Available. FINDINGS: No acute fracture or dislocation. The bones are osteopenic. Severe arthritic changes of the right knee. Atherosclerotic calcification of the right lower extremity vasculature. The soft tissues are unremarkable. IMPRESSION: 1. No acute fracture or dislocation. 2. Severe arthritic changes of the right knee. Electronically Signed   By: Anner Crete M.D.   On: 01/27/2022 22:25    Orson Eva, DO  [facility]  If 7PM-7AM, please contact night-coverage [email or web address] Password [email or web address] 01/29/2022, 7:21  AM   LOS: 1 day

## 2022-01-29 NOTE — ED Notes (Signed)
Pt restful at this time

## 2022-01-29 NOTE — ED Notes (Signed)
External cath applied. Pt pulled up in bed. Needed 3 people to help with blood draw due to pt combative and verbalizing he was going to hit [place]. Back brace reapplied. Iv intact to right wrist. Attempted to reorient and update pt. Clean, warm blankets applied.

## 2022-01-29 NOTE — Progress Notes (Addendum)
Rounding Note    Patient Name: [name] [name] Date of Encounter: 01/29/2022  [place] Cardiologist: Minus Breeding, MD   Subjective   Has had intermittent confusion and agitation. No reported chest pain or dizziness.  Inpatient Medications    Scheduled Meds:  amLODipine  5 mg Oral Daily   clopidogrel  75 mg Oral Daily   escitalopram  10 mg Oral Daily   heparin  5,000 Units Subcutaneous Q8H   insulin aspart  0-5 Units Subcutaneous QHS   insulin aspart  0-9 Units Subcutaneous TID WC   insulin detemir  7 Units Subcutaneous QHS   lidocaine  1 patch Transdermal Q24H   nicotine  21 mg Transdermal Daily   pantoprazole  40 mg Oral Daily   rosuvastatin  5 mg Oral Daily   Continuous Infusions:  sodium chloride 35 mL/hr at 01/28/22 1609   PRN Meds: acetaminophen **OR** acetaminophen, gabapentin, midazolam, ondansetron **OR** ondansetron (ZOFRAN) IV, oxyCODONE   Vital Signs    Vitals:   01/28/22 2000 01/28/22 2310 01/29/22 0030 01/29/22 0600  BP: (!) 168/67 (!) 165/91 (!) 178/75 (!) 153/105  Pulse: 74 60 68 75  Resp: (!) 24 (!) 22 19 (!) 24  Temp:  98 F (36.7 C)  98.2 F (36.8 C)  TempSrc:  Axillary    SpO2: 96% 96% 97% 98%  Weight:      Height:        Intake/Output Summary (Last 24 hours) at 01/29/2022 0756 Last data filed at 01/29/2022 0358 Gross per 24 hour  Intake --  Output 900 ml  Net -900 ml      01/27/2022    6:25 PM 05/13/2018    9:00 AM 05/07/2018   10:39 AM  Last 3 Weights  Weight (lbs) 165 lb 210 lb 210 lb  Weight (kg) 74.844 kg 95.255 kg 95.255 kg      Telemetry    Sinus rhythm currently - Personally Reviewed  ECG    No new- Personally Reviewed  Physical Exam   GEN: No acute distress.   Neck: No JVD Cardiac: RRR, 1/6 systolic murmur, no gallop.  Respiratory: Scattered rhonchi. GI: Soft, nontender, non-distended  MS: No edema.  Labs    Chemistry Recent Labs  Lab 01/27/22 1947 01/28/22 0503 01/29/22 0726  NA 135 135  139  K 3.4* 4.0 3.5  CL 101 106 107  CO2 22 20* 19*  GLUCOSE 131* 106* 123*  BUN 59* 62* 50*  CREATININE 2.31* 2.16* 1.78*  CALCIUM 8.9 8.6* 9.0  MG 1.8  1.8  --  1.8  PROT 6.9 6.1*  --   ALBUMIN 3.6 3.1*  --   AST 17 14*  --   ALT 12 10  --   ALKPHOS 61 50  --   BILITOT 0.7 0.8  --   GFRNONAA 27* 29* 36*  ANIONGAP '12 9 13    '$ Hematology Recent Labs  Lab 01/27/22 1947 01/29/22 0726  WBC 17.6* 14.1*  RBC 4.43 4.47  HGB 13.5 13.5  HCT 41.0 41.3  MCV 92.6 92.4  MCH 30.5 30.2  MCHC 32.9 32.7  RDW 13.4 13.2  PLT 207 204   Thyroid  Recent Labs  Lab 01/28/22 0925  TSH 0.978     Cardiac Studies   Echocardiogram 01/28/2022: IMPRESSIONS   1. Left ventricular ejection fraction, by estimation, is 65 to 70%. The  left ventricle has normal function. The left ventricle has no regional  wall motion abnormalities. There is  mild concentric left ventricular  hypertrophy. Left ventricular diastolic  parameters are indeterminate.   2. Right ventricular systolic function is normal. The right ventricular  size is normal. Tricuspid regurgitation signal is inadequate for assessing  PA pressure.   3. Left atrial size was mild to moderately dilated.   4. The mitral valve is grossly normal, mildly calcified. Trivial mitral  valve regurgitation.   5. The aortic valve is tricuspid. There is mild calcification of the  aortic valve. Aortic valve regurgitation is not visualized. Aortic valve  sclerosis is present, with no evidence of aortic valve stenosis. Aortic  valve mean gradient measures 3.0 mmHg.   6. Aortic dilatation noted. There is mild dilatation of the aortic root,  measuring 42 mm. There is mild dilatation of the ascending aorta,  measuring 39 mm.   7. Unable to estimate CVP.   Comparison(s): No prior Echocardiogram.    Carotid Dopplers 01/28/2022: IMPRESSION: Right:   Heterogeneous and partially calcified plaque at the right carotid bifurcation contributes to 70%-99%  stenosis by established duplex criteria.   Left:   Color duplex indicates moderate heterogeneous and calcified plaque, with no hemodynamically significant stenosis by duplex criteria in the extracranial cerebrovascular circulation.   Assessment & Plan    1.  Status post fall, possible syncope.  Patient has evidence of conduction system disease with right bundle branch block, significant PR prolongation and intermittent second-degree heart block, type I (possibly limited type II).  Toprol-XL 25 mg has been discontinued.  No pauses or high degree heart block noted overnight.  TSH normal, LVEF 65 to 70%.  2.  Possible paroxysmal atrial fibrillation by telemetry overnight in the setting of lead motion artifact.  ECG this morning shows sinus rhythm with significant PR prolongation, right bundle branch block, and PVC.  3.  CAD status post DES to the circumflex in 2005 with residual disease managed medically.  No regular cardiology follow-up with Dr. Percival Spanish since 2020.  Patient denies any obvious angina.  Currently on Plavix and Crestor.  4.  Essential hypertension, currently on Norvasc.  Avalide discontinued in the setting of acute on chronic renal insufficiency.  5.  Acute on chronic renal insufficiency with CKD stage III at baseline.  Avalide discontinued, creatinine down to 1.78 from 2.31.  6.  Progressive carotid artery disease, 70 to 99% RICA and nonobstructive LICA.  Currently on Plavix and Crestor.  VVS plans to consult once patient at [facility].  Overnight course reviewed.  At this point no clear indication for pacemaker, would continue to follow telemetry and can involve EP if necessary.  For now would keep him off all AV nodal blockers.  Carotid disease to be assessed by [facility] at [facility], continue Plavix and Crestor.  If he does show more definitive evidence of paroxysmal atrial fibrillation, will need to consider full dose anticoagulation, for now he is on DVT prophylaxis heparin per  primary team.  Satira Sark, M.D., F.A.C.C.

## 2022-01-29 NOTE — ED Notes (Signed)
Have encouraged fluids every time in room. Pt would not eat anything of his tray this am. I attempted to feed him but he would smack my hand away. Pt has drank approx one cup of fluids today. Mm dry. Report given to [facility]

## 2022-01-30 DIAGNOSIS — R55 Syncope and collapse: Secondary | ICD-10-CM | POA: Diagnosis not present

## 2022-01-30 DIAGNOSIS — I459 Conduction disorder, unspecified: Secondary | ICD-10-CM | POA: Diagnosis not present

## 2022-01-30 DIAGNOSIS — I441 Atrioventricular block, second degree: Secondary | ICD-10-CM | POA: Diagnosis not present

## 2022-01-30 DIAGNOSIS — S32010A Wedge compression fracture of first lumbar vertebra, initial encounter for closed fracture: Secondary | ICD-10-CM | POA: Diagnosis not present

## 2022-01-30 DIAGNOSIS — I6521 Occlusion and stenosis of right carotid artery: Secondary | ICD-10-CM

## 2022-01-30 LAB — GLUCOSE, CAPILLARY
Glucose-Capillary: 111 mg/dL — ABNORMAL HIGH (ref 70–99)
Glucose-Capillary: 116 mg/dL — ABNORMAL HIGH (ref 70–99)
Glucose-Capillary: 104 mg/dL — ABNORMAL HIGH (ref 70–99)
Glucose-Capillary: 105 mg/dL — ABNORMAL HIGH (ref 70–99)

## 2022-01-30 MED ORDER — QUETIAPINE FUMARATE 25 MG PO TABS
25.0000 mg | ORAL_TABLET | Freq: Every day | ORAL | Status: DC
  Administered 2022-01-30: 25 mg via ORAL
  Filled 2022-01-30: qty 1

## 2022-01-30 MED ORDER — AMLODIPINE BESYLATE 10 MG PO TABS
10.0000 mg | ORAL_TABLET | Freq: Every day | ORAL | Status: DC
  Administered 2022-01-31 – 2022-02-06 (×7): 10 mg via ORAL
  Filled 2022-01-30 (×8): qty 1

## 2022-01-30 NOTE — Progress Notes (Signed)
[facility] night cross cover note:   I was notified by RN that this patient, who has arrived from [facility], is altered, combative, attempting to get out of bed, with these behaviors refractory to attempts at verbal redirection.  Additionally, RN conveys that the existing orders for as needed Versed for agitation that were used at [facility] are not permissible on the floor at [facility].   I subsequently discontinued existing order for prn Versed in favor of new order for prn Haldol for agitation.   Update: patient, who has not used the urinal this evening has bladder scan 710 cc's around 0430 this AM.  It is unclear if this is on the basis of the patient's ability to pass urine due to urinary obstruction versus inability to use urinal as a component of the above confusion.  There is also the possibility that urinary retention may be contributing to the above agitation.   I subsequently placed order for q6H post-void residual bladder scans, with prn straight cath for PVR bladder scan greater than 400 cc's, and conveyed this plan to the patient's RN.    Babs Bertin, DO Hospitalist

## 2022-01-30 NOTE — Progress Notes (Signed)
Progress Note   Patient: [name] [name] ENI:[id number] DOB: December 04, 1932 DOA: 01/27/2022     2 DOS: the patient was seen and examined on 01/30/2022   Brief hospital course:  89 y.o. male with medical history significant of coronary artery disease, carotid stenosis, diabetes mellitus, hypertension, peptic ulcer disease, right bundle branch block, and more presents the ED with a chief complaint of fall.  Patient does not know how he fell.  Daughter at bedside provided most the history.  She reports that he was ambulating with his walker and he said he was moving a box out of the way and fell down.  She did not see the fall, but when she got to him immediately after he had abrasion on the back of his head, and could not get up.  Pt cannot clarify if he had LOC or not.  He was not complaining of chest pain or palpitations according to the daughter.  He had a similar fall a week PTA when the caregiver was there.  He was sore but seemed okay, but started complaining more more of low back pain since that first fall.  On the day of admission, pt had trouble getting up due to pain in his back. For the falls he had incontinence of bowel and bladder which appears to be a chronic issue.   Daughter reports that she has noticed a functional decline in him over the past few months.  She says that he has been more fatigued.  Pt was seen by cardiology who felt the patient had second-degree AV block type I, but could not rule out type II second-degree AV block.  As result, patient is being transferred to [facility] for EP evaluation and for cardiology to follow.    Assessment and Plan: Second-degree AV block -Appreciate cardiology consult -initial concern for heart block -No prolonged pauses or heart block since stopping Toprolol per Cardiology -continue to monitor on tele. No indication for pacemaker at present   Acute metabolic encephalopathy -Patient has developed some hospital delirium since admission -family had  reported cognitive functional decline over last 6 months -B12 580 -TSH 0.978 -UA negative for pyuria -CT brain negative for acute findings -Overnight events noted. Became very agitated with staff, given PRN haldol -EKG reviewed, QTc 490. Will give trial of low dose seroquel   Close compression fracture L1 -CT lumbar spine shows L1 fracture with 30-40% height loss -TLSO brace -judicious opioids -PT following   Acute on chronic renal failure--CKD stage IIIb -Baseline creatinine 1.4-1.6 -Serum creatinine peaked 2.31 -Holding irbesartan/HCTZ -Cr down to 1.78, still looks dry. Cont IVF   Right Carotid stenosis -01/27/22 Carotid ultrasound--R ICA with 70-99% stenosis -11/08/2018 US carotid--R-ICA 60-79% stenosis -discussed with VVS--appreciate input by Vascular   Essential hypertension -Continue amlodipine -Holding metoprolol secondary to heart block -Holding irbesartan secondary to acute kidney injury -BP stable   Controlled diabetes mellitus type 2  -01/27/2022 hemoglobin A1c 5.5 -NovoLog sliding scale -Continue reduced dose Levemir   Coronary artery disease -No chest pain presently -Continue Plavix -Continue Crestor   Tobacco abuse -Tobacco cessation discussed -nicoderm patch   COPD -Stable on room air   Mixed hyperlipidemia -Continue statin   Cervical spine stenosis -01/27/2022 CT cervical spine--multilevel DJD; 7 mm AP thecal sac stenosis C1-2>>appears unchanged from prior MRI C-spine 07/19/17 -Patient denies any neck pain -no extremity weakness      Subjective: Unable to assess given mentation  Physical Exam: Vitals:   01/30/22 6144 01/30/22 0459 01/30/22 3154  01/30/22 0748  BP: (!) 147/69 (!) 156/87 (!) 155/65 (!) 141/71  Pulse: 79 79  69  Resp:    15  Temp: 97.9 F (36.6 C) 99.5 F (37.5 C) 98.6 F (37 C) 100.1 F (37.8 C)  TempSrc: Axillary Axillary Oral Axillary  SpO2: 92%   95%  Weight:      Height:       General exam: Asleep, arousable,  laying in bed, in nad, however later woke up and became more agitated and flailing arms trying to get up Respiratory system: Normal respiratory effort, no audible wheezing Cardiovascular system: regular rate, s1, s2 Gastrointestinal system: Soft, nondistended, positive BS Central nervous system: CN2-12 grossly intact, no tremors Extremities: Perfused, no clubbing Skin: Normal skin turgor, no notable skin lesions seen Psychiatry: Unable to assess given mentation  Data Reviewed:  Labs reviewed: Na 139, K 3.5, Cr 1.78, WBC 14.1, Hgb 13.5   Family Communication: Pt in room, family not at bedside  Disposition: Status is: Inpatient Remains inpatient appropriate because: Severity of illness  Planned Discharge Destination:  Unclear at this time    Author: Marylu Lund, MD 01/30/2022 3:50 PM  For on call review [email or web address].

## 2022-01-30 NOTE — TOC CAGE-AID Note (Signed)
Transition of Care [facility]) - CAGE-AID Screening   Patient Details  Name: [name] [name] MRN: [id number] Date of Birth: 1932-08-16  Transition of Care [facility] CM/SW Contact:    Bethann Berkshire, [place] Phone Number: 01/30/2022, 9:42 AM   CAGE-AID Screening: Substance Abuse Screening unable to be completed due to: : Patient unable to participate

## 2022-01-30 NOTE — Progress Notes (Signed)
PT Cancellation Note  Patient Details Name: [name] [name] MRN: [id number] DOB: 03-12-1932   Cancelled Treatment:    Reason Eval/Treat Not Completed: Other (comment);Medical issues which prohibited therapy.  Pt has a painful deformed L UE and new L1 fracture with no guidelines for brace or precautions.  Follow up as information is available.   Ramond Dial 01/30/2022, 11:47 AM  Mee Hives, PT PhD Acute Rehab Dept. Number: [place]

## 2022-01-30 NOTE — Progress Notes (Signed)
Pt has not voided during this shift. Pt states he needs to urinate not sure if he doesn't understand he is able to use urinal or is just unable to void. Bladder scan showed 710 ml. Notified MD

## 2022-01-30 NOTE — Progress Notes (Signed)
Rounding Note    Patient Name: [name] [name] Date of Encounter: 01/30/2022  [facility] Cardiologist: Minus Breeding, MD   Subjective   No CP or dyspnea; mildly confused this AM  Inpatient Medications    Scheduled Meds:  amLODipine  5 mg Oral Daily   clopidogrel  75 mg Oral Daily   escitalopram  10 mg Oral Daily   heparin  5,000 Units Subcutaneous Q8H   insulin aspart  0-5 Units Subcutaneous QHS   insulin aspart  0-9 Units Subcutaneous TID WC   insulin detemir  7 Units Subcutaneous QHS   lidocaine  1 patch Transdermal Q24H   nicotine  21 mg Transdermal Daily   pantoprazole  40 mg Oral Daily   rosuvastatin  5 mg Oral Daily   Continuous Infusions:  sodium chloride 75 mL/hr at 01/30/22 0935   PRN Meds: acetaminophen **OR** acetaminophen, gabapentin, haloperidol lactate, ondansetron **OR** ondansetron (ZOFRAN) IV, oxyCODONE   Vital Signs    Vitals:   01/30/22 0123 01/30/22 0459 01/30/22 0629 01/30/22 0748  BP: (!) 147/69 (!) 156/87 (!) 155/65 (!) 141/71  Pulse: 79 79  69  Resp:    15  Temp: 97.9 F (36.6 C) 99.5 F (37.5 C) 98.6 F (37 C) 100.1 F (37.8 C)  TempSrc: Axillary Axillary Oral Axillary  SpO2: 92%   95%  Weight:      Height:        Intake/Output Summary (Last 24 hours) at 01/30/2022 0940 Last data filed at 01/30/2022 0600 Gross per 24 hour  Intake 1000 ml  Output 1000 ml  Net 0 ml      01/27/2022    6:25 PM 05/13/2018    9:00 AM 05/07/2018   10:39 AM  Last 3 Weights  Weight (lbs) 165 lb 210 lb 210 lb  Weight (kg) 74.844 kg 95.255 kg 95.255 kg      Telemetry    Sinus rhythm with first-degree AV block, PACs and occasional nonconducted PACs.  No prolonged pauses.- Personally Reviewed   Physical Exam   GEN: No acute distress.  Frail Neck: No JVD Cardiac: RRR, no murmurs, rubs, or gallops.  Respiratory: Clear to auscultation bilaterally. GI: Soft, nontender, non-distended  MS: No edema Neuro:  Nonfocal  Psych: Normal affect    Labs     Chemistry Recent Labs  Lab 01/27/22 1947 01/28/22 0503 01/29/22 0726  NA 135 135 139  K 3.4* 4.0 3.5  CL 101 106 107  CO2 22 20* 19*  GLUCOSE 131* 106* 123*  BUN 59* 62* 50*  CREATININE 2.31* 2.16* 1.78*  CALCIUM 8.9 8.6* 9.0  MG 1.8  1.8  --  1.8  PROT 6.9 6.1*  --   ALBUMIN 3.6 3.1*  --   AST 17 14*  --   ALT 12 10  --   ALKPHOS 61 50  --   BILITOT 0.7 0.8  --   GFRNONAA 27* 29* 36*  ANIONGAP '12 9 13    '$ Hematology Recent Labs  Lab 01/27/22 1947 01/29/22 0726  WBC 17.6* 14.1*  RBC 4.43 4.47  HGB 13.5 13.5  HCT 41.0 41.3  MCV 92.6 92.4  MCH 30.5 30.2  MCHC 32.9 32.7  RDW 13.4 13.2  PLT 207 204   Thyroid  Recent Labs  Lab 01/28/22 0925  TSH 0.978    Radiology    ECHOCARDIOGRAM COMPLETE  Result Date: 01/28/2022    ECHOCARDIOGRAM REPORT   Patient Name:   ZEBULAN HINSHAW Date  of Exam: 01/28/2022 Medical Rec #:  [id number]    Height:       63.0 in Accession #:    [id number]   Weight:       165.0 lb Date of Birth:  08-May-1932     BSA:          1.782 m Patient Age:    13 years     BP:           144/79 mmHg Patient Gender: M            HR:           68 bpm. Exam Location:  [facility] Procedure: 2D Echo, Cardiac Doppler and Color Doppler Indications:    Syncope  History:        Patient has no prior history of Echocardiogram examinations.                 CAD, COPD; Risk Factors:Hypertension, Diabetes and Dyslipidemia.  Sonographer:    Wenda Low Referring Phys: [place] Comments: No subcostal window and Technically difficult study due to poor echo windows. Image acquisition challenging due to COPD. IMPRESSIONS  1. Left ventricular ejection fraction, by estimation, is 65 to 70%. The left ventricle has normal function. The left ventricle has no regional wall motion abnormalities. There is mild concentric left ventricular hypertrophy. Left ventricular diastolic parameters are indeterminate.  2. Right ventricular systolic function is  normal. The right ventricular size is normal. Tricuspid regurgitation signal is inadequate for assessing PA pressure.  3. Left atrial size was mild to moderately dilated.  4. The mitral valve is grossly normal, mildly calcified. Trivial mitral valve regurgitation.  5. The aortic valve is tricuspid. There is mild calcification of the aortic valve. Aortic valve regurgitation is not visualized. Aortic valve sclerosis is present, with no evidence of aortic valve stenosis. Aortic valve mean gradient measures 3.0 mmHg.  6. Aortic dilatation noted. There is mild dilatation of the aortic root, measuring 42 mm. There is mild dilatation of the ascending aorta, measuring 39 mm.  7. Unable to estimate CVP. Comparison(s): No prior Echocardiogram. FINDINGS  Left Ventricle: Left ventricular ejection fraction, by estimation, is 65 to 70%. The left ventricle has normal function. The left ventricle has no regional wall motion abnormalities. The left ventricular internal cavity size was normal in size. There is  mild concentric left ventricular hypertrophy. Left ventricular diastolic parameters are indeterminate. Right Ventricle: The right ventricular size is normal. No increase in right ventricular wall thickness. Right ventricular systolic function is normal. Tricuspid regurgitation signal is inadequate for assessing PA pressure. Left Atrium: Left atrial size was mild to moderately dilated. Right Atrium: Right atrial size was normal in size. Pericardium: There is no evidence of pericardial effusion. Mitral Valve: The mitral valve is grossly normal. There is mild calcification of the mitral valve leaflet(s). Trivial mitral valve regurgitation. MV peak gradient, 6.9 mmHg. The mean mitral valve gradient is 2.0 mmHg. Tricuspid Valve: The tricuspid valve is grossly normal. Tricuspid valve regurgitation is trivial. Aortic Valve: The aortic valve is tricuspid. There is mild calcification of the aortic valve. There is mild aortic valve  annular calcification. Aortic valve regurgitation is not visualized. Aortic valve sclerosis is present, with no evidence of aortic valve stenosis. Aortic valve mean gradient measures 3.0 mmHg. Aortic valve peak gradient measures 5.1 mmHg. Aortic valve area, by VTI measures 2.72 cm. Pulmonic Valve: The pulmonic valve was grossly normal. Pulmonic valve regurgitation is trivial.  Aorta: Aortic dilatation noted. There is mild dilatation of the aortic root, measuring 42 mm. There is mild dilatation of the ascending aorta, measuring 39 mm. Venous: Unable to estimate CVP. The inferior vena cava was not well visualized. IAS/Shunts: No atrial level shunt detected by color flow Doppler.  LEFT VENTRICLE PLAX 2D LVIDd:         4.60 cm   Diastology LVIDs:         3.30 cm   LV e' medial:    14.10 cm/s LV PW:         1.10 cm   LV E/e' medial:  4.2 LV IVS:        1.20 cm   LV e' lateral:   16.10 cm/s LVOT diam:     2.00 cm   LV E/e' lateral: 3.7 LV SV:         75 LV SV Index:   42 LVOT Area:     3.14 cm  LEFT ATRIUM             Index        RIGHT ATRIUM           Index LA diam:        4.00 cm 2.24 cm/m   RA Area:     21.10 cm LA Vol (A2C):   79.5 ml 44.62 ml/m  RA Volume:   56.20 ml  31.54 ml/m LA Vol (A4C):   64.9 ml 36.42 ml/m LA Biplane Vol: 72.2 ml 40.52 ml/m  AORTIC VALVE AV Area (Vmax):    2.78 cm AV Area (Vmean):   2.38 cm AV Area (VTI):     2.72 cm AV Vmax:           113.00 cm/s AV Vmean:          74.600 cm/s AV VTI:            0.275 m AV Peak Grad:      5.1 mmHg AV Mean Grad:      3.0 mmHg LVOT Vmax:         99.90 cm/s LVOT Vmean:        56.600 cm/s LVOT VTI:          0.238 m LVOT/AV VTI ratio: 0.87  AORTA Ao Root diam: 4.20 cm Ao Asc diam:  3.90 cm MITRAL VALVE MV Area (PHT): 2.75 cm     SHUNTS MV Area VTI:   2.33 cm     Systemic VTI:  0.24 m MV Peak grad:  6.9 mmHg     Systemic Diam: 2.00 cm MV Mean grad:  2.0 mmHg MV Vmax:       1.31 m/s MV Vmean:      61.7 cm/s MV Decel Time: 276 msec MV E velocity: 59.10  cm/s MV A velocity: 100.00 cm/s MV E/A ratio:  0.59 Rozann Lesches MD Electronically signed by Rozann Lesches MD Signature Date/Time: 01/28/2022/1:50:31 PM    Final      Patient Profile     89 y.o. male with past medical history of coronary artery disease, cerebrovascular disease, hypertension, hyperlipidemia, bradycardia for evaluation of bradycardia and fall.  Echocardiogram shows normal LV function, mild left ventricular hypertrophy, mild to moderate left atrial enlargement and mildly dilated aortic root.  Assessment & Plan    1 status post fall-unclear if patient had syncope.  He did have significant conduction disease noted on his electrocardiogram and intermittent second-degree AV block at [facility] by report.  His  Toprol was discontinued.  I have reviewed his telemetry over the past 12 hours and there are no prolonged pauses or heart block to date.  Will continue to monitor.  No indication for pacemaker at present.  Note his LV function is normal.  2 question paroxysmal atrial fibrillation-apparently there was a question of atrial fibrillation at [facility].  I do not see atrial fibrillation evident on telemetry today.  Will continue to hold AV nodal blocking agents as outlined above.  Regardless I do not think he is a candidate for anticoagulation given history of recurrent falls.  3 coronary artery disease-continue Plavix and statin.  Patient denies chest pain.  4 carotid artery disease-patient was transferred for vascular consult.  5 hypertension-patient's blood pressure remains elevated.  Increase amlodipine to 10 mg daily and follow.  6 acute on chronic stage IIIb kidney disease-continue to follow renal function.  For questions or updates, please contact [place] Please consult [email or web address] for contact info under        Signed, Kirk Ruths, MD  01/30/2022, 9:40 AM

## 2022-01-30 NOTE — Consult Note (Signed)
[facility] [place] NOTE   MRN : [id number]  Reason for Consult: Carotid stenosis Referring Physician: DR. Tat Hospitalist  History of Present Illness: 89 year old male that vascular surgery has been consulted for carotid stenosis as a transfer from [facility].  Patient is obtunded on the floor after Haldol for agitation.  History is obtained from the son at bedside.  Son states he went to [facility] after mechanical fall on Monday and has had multiple falls at home.  No history of strokes according to the son.  Moving all extremities prior to his Haldol.  Patient's son states that he previously stated he did not want this carotid artery surgery since a friend died from the operation.  He did have a CT cervical spine at [facility] with concern for heavily calcified carotid arteries and then had a duplex.  Carotid duplex showed 70-99% stenosis in the right carotid with no significant hemodynamic stenosis in the left carotid.  Antegrade flow in both vertebral arteries.     Current Facility-Administered Medications  Medication Dose Route Frequency Provider Last Rate Last Admin   0.9 %  sodium chloride infusion   Intravenous Continuous Donne Hazel, MD 75 mL/hr at 01/30/22 0935 Rate Change at 01/30/22 0935   acetaminophen (TYLENOL) tablet 650 mg  650 mg Oral Q6H PRN Zierle-Ghosh, Asia B, DO       Or   acetaminophen (TYLENOL) suppository 650 mg  650 mg Rectal Q6H PRN Zierle-Ghosh, Asia B, DO       amLODipine (NORVASC) tablet 5 mg  5 mg Oral Daily Johnson, Clanford L, MD   5 mg at 01/29/22 1008   clopidogrel (PLAVIX) tablet 75 mg  75 mg Oral Daily Zierle-Ghosh, Asia B, DO   75 mg at 01/29/22 1008   escitalopram (LEXAPRO) tablet 10 mg  10 mg Oral Daily Zierle-Ghosh, Asia B, DO   10 mg at 01/29/22 1008   gabapentin (NEURONTIN) capsule 100 mg  100 mg Oral QHS PRN Zierle-Ghosh, Asia B, DO       haloperidol lactate (HALDOL) injection 5 mg  5 mg Intravenous Q6H PRN  Howerter, Justin B, DO   5 mg at 01/30/22 0904   heparin injection 5,000 Units  5,000 Units Subcutaneous Q8H Zierle-Ghosh, Asia B, DO   5,000 Units at 01/30/22 0630   insulin aspart (novoLOG) injection 0-5 Units  0-5 Units Subcutaneous QHS Tat, Shanon Brow, MD       insulin aspart (novoLOG) injection 0-9 Units  0-9 Units Subcutaneous TID WC Tat, Shanon Brow, MD   1 Units at 01/29/22 0859   insulin detemir (LEVEMIR) injection 7 Units  7 Units Subcutaneous QHS Zierle-Ghosh, Asia B, DO   7 Units at 01/29/22 2306   lidocaine (LIDODERM) 5 % 1 patch  1 patch Transdermal Q24H Zierle-Ghosh, Asia B, DO   1 patch at 01/28/22 0044   nicotine (NICODERM CQ - dosed in mg/24 hours) patch 21 mg  21 mg Transdermal Daily Tat, David, MD   21 mg at 01/29/22 1007   ondansetron (ZOFRAN) tablet 4 mg  4 mg Oral Q6H PRN Zierle-Ghosh, Asia B, DO       Or   ondansetron (ZOFRAN) injection 4 mg  4 mg Intravenous Q6H PRN Zierle-Ghosh, Asia B, DO       oxyCODONE (Oxy IR/ROXICODONE) immediate release tablet 5 mg  5 mg Oral Q4H PRN Zierle-Ghosh, Asia B, DO       pantoprazole (PROTONIX) EC tablet 40 mg  40 mg Oral Daily Zierle-Ghosh, Asia B, DO   40 mg at 01/29/22 1007   rosuvastatin (CRESTOR) tablet 5 mg  5 mg Oral Daily Zierle-Ghosh, Asia B, DO   5 mg at 01/29/22 1008   Facility-Administered Medications Ordered in Other Encounters  Medication Dose Route Frequency Provider Last Rate Last Admin   gemcitabine (GEMZAR) chemo syringe for bladder instillation 2,000 mg  2,000 mg Bladder Instillation Once Franchot Gallo, MD          Past Medical History:  Diagnosis Date   Bronchitis    Hx of   CAD (coronary artery disease)    (2005 LAD 30-40% stenosis  followed by 60-65% focal stenosis.  The second diagonal had 80%   stenosis.  The circumflex had 85-90% mid stenosis and was stented with a  Taxus stent.  The obtuse marginal had 20-25% stenosis and obtuse   marginal-2 had 40-50% stenosis.  The right coronary artery had 40-50%  proximal  stenosis and mid 75% stenosis), well-preserved ejection  fraction (50-55% )   Carotid stenosis    History of colonic polyps    HTN (hypertension)    Myocardial infarction [facility])    Obesity    Prostate cancer ([place]) 2001   Radiation    PUD (peptic ulcer disease)    Radiation proctitis    Right bundle branch block    Sigmoid diverticulosis    Skin cancer    Sleep apnea    Tobacco abuse    Type 2 diabetes mellitus ([place])     Past Surgical History:  Procedure Laterality Date   CARDIAC CATHETERIZATION  12/13/2003   ptca, stent x 2   CHOLECYSTECTOMY     COLONOSCOPY  05/11/2000   COLONOSCOPY W/ POLYPECTOMY     CYSTOSCOPY W/ RETROGRADES Bilateral 05/13/2018   Procedure: CYSTOSCOPY WITH RETROGRADE PYELOGRAM;  Surgeon: Franchot Gallo, MD;  Location: [facility];  Service: Urology;  Laterality: Bilateral;   HERNIA REPAIR     right inguinal   Left Arm repair  1994   TRANSURETHRAL RESECTION OF BLADDER TUMOR WITH MITOMYCIN-C N/A 05/13/2018   Procedure: TRANSURETHRAL RESECTION OF BLADDER TUMOR WITH MITOMYCIN-C;  Surgeon: Franchot Gallo, MD;  Location: [facility];  Service: Urology;  Laterality: N/A;  30 MNS    Social History Social History   Tobacco Use   Smoking status: Every Day    Packs/day: 1.00    Years: 60.00    Total pack years: 60.00    Types: Cigarettes   Smokeless tobacco: Never  Vaping Use   Vaping Use: Never used  Substance Use Topics   Alcohol use: No   Drug use: No    Family History Family History  Problem Relation Age of Onset   Cancer Father    Coronary artery disease Father 37   Coronary artery disease Brother 96   Coronary artery disease Sister 63   Stroke Mother 26    No Known Allergies   REVIEW OF SYSTEMS  General: '[ ]'$  Weight loss, '[ ]'$  Fever, '[ ]'$  chills Neurologic: '[ ]'$  Dizziness, '[ ]'$  Blackouts, '[ ]'$  Seizure '[ ]'$  Stroke, '[ ]'$  "Mini stroke", '[ ]'$  Slurred speech, '[ ]'$  Temporary blindness; '[ ]'$  weakness in arms or legs, '[ ]'$  Hoarseness '[ ]'$  Dysphagia Cardiac: '[ ]'$   Chest pain/pressure, '[ ]'$  Shortness of breath at rest '[ ]'$  Shortness of breath with exertion, '[ ]'$  Atrial fibrillation or irregular heartbeat  Vascular: '[ ]'$  Pain in legs with walking, '[ ]'$  Pain in legs at  rest, '[ ]'$  Pain in legs at night,  '[ ]'$  Non-healing ulcer, '[ ]'$  Blood clot in vein/DVT,   Pulmonary: '[ ]'$  Home oxygen, '[ ]'$  Productive cough, '[ ]'$  Coughing up blood, '[ ]'$  Asthma,  '[ ]'$  Wheezing '[ ]'$  COPD Musculoskeletal:  '[ ]'$  Arthritis, '[ ]'$  Low back pain, '[ ]'$  Joint pain Hematologic: '[ ]'$  Easy Bruising, '[ ]'$  Anemia; '[ ]'$  Hepatitis Gastrointestinal: '[ ]'$  Blood in stool, '[ ]'$  Gastroesophageal Reflux/heartburn, Urinary: '[ ]'$  chronic Kidney disease, '[ ]'$  on HD - '[ ]'$  MWF or '[ ]'$  TTHS, '[ ]'$  Burning with urination, '[ ]'$  Difficulty urinating Skin: '[ ]'$  Rashes, '[ ]'$  Wounds Psychological: '[ ]'$  Anxiety, '[ ]'$  Depression  Physical Examination Vitals:   01/30/22 0123 01/30/22 0459 01/30/22 0629 01/30/22 0748  BP: (!) 147/69 (!) 156/87 (!) 155/65 (!) 141/71  Pulse: 79 79  69  Resp:    15  Temp: 97.9 F (36.6 C) 99.5 F (37.5 C) 98.6 F (37 C) 100.1 F (37.8 C)  TempSrc: Axillary Axillary Oral Axillary  SpO2: 92%   95%  Weight:      Height:       Body mass index is 29.23 kg/m.  General:  NAD Pulmonary: normal non-labored breathing Cardiac: RRR Abdomen: soft, NT, no masses Skin: no rashes, ulcers noted;  no Gangrene , no cellulitis; no open wounds;  Musculoskeletal: no muscle wasting or atrophy; no edema  Neurologic: A&O X 1   Significant Diagnostic Studies: CBC Lab Results  Component Value Date   WBC 14.1 (H) 01/29/2022   HGB 13.5 01/29/2022   HCT 41.3 01/29/2022   MCV 92.4 01/29/2022   PLT 204 01/29/2022    BMET    Component Value Date/Time   NA 139 01/29/2022 0726   K 3.5 01/29/2022 0726   CL 107 01/29/2022 0726   CO2 19 (L) 01/29/2022 0726   GLUCOSE 123 (H) 01/29/2022 0726   BUN 50 (H) 01/29/2022 0726   CREATININE 1.78 (H) 01/29/2022 0726   CALCIUM 9.0 01/29/2022 0726   GFRNONAA 36 (L)  01/29/2022 0726   GFRAA 45 (L) 05/03/2018 0928   Estimated Creatinine Clearance: 26.5 mL/min (A) (by C-G formula based on SCr of 1.78 mg/dL (H)).  COAG Lab Results  Component Value Date   INR 1.1 01/27/2022   INR 1.0 05/13/2018     Non-Invasive Vascular Imaging:   CLINICAL DATA:  89 year old male with a history of carotid stenosis   EXAM: BILATERAL CAROTID DUPLEX ULTRASOUND   TECHNIQUE: Pearline Cables scale imaging, color Doppler and duplex ultrasound were performed of bilateral carotid and vertebral arteries in the neck.   COMPARISON:  None Available.   FINDINGS: Criteria: Quantification of carotid stenosis is based on velocity parameters that correlate the residual internal carotid diameter with NASCET-based stenosis levels, using the diameter of the distal internal carotid lumen as the denominator for stenosis measurement.   The following velocity measurements were obtained:   RIGHT   ICA:  Systolic 852 cm/sec, Diastolic 44 cm/sec   CCA:  55 cm/sec   SYSTOLIC ICA/CCA RATIO:  4.7   ECA:  201 cm/sec   LEFT   ICA:  Systolic 91 cm/sec, Diastolic 19 cm/sec   CCA:  74 cm/sec   SYSTOLIC ICA/CCA RATIO:  1.4   ECA:  100 cm/sec   Right Brachial SBP: Not acquired   Left Brachial SBP: Not acquired   RIGHT CAROTID ARTERY: No significant calcifications of the right common carotid artery. Intermediate waveform maintained. Moderate heterogeneous  and partially calcified plaque at the right carotid bifurcation. Shadowing present. Spectral broadening. Low resistance waveform of the right ICA. Some tortuosity.   RIGHT VERTEBRAL ARTERY: Antegrade flow with low resistance waveform.   LEFT CAROTID ARTERY: No significant calcifications of the left common carotid artery. Intermediate waveform maintained. Moderate heterogeneous and partially calcified plaque at the left carotid bifurcation. Lumen shadowing. Low resistance waveform of the left ICA. Tortuosity.   LEFT VERTEBRAL  ARTERY:  Antegrade flow with low resistance waveform.   IMPRESSION: Right:   Heterogeneous and partially calcified plaque at the right carotid bifurcation contributes to 70%-99% stenosis by established duplex criteria.   Left:   Color duplex indicates moderate heterogeneous and calcified plaque, with no hemodynamically significant stenosis by duplex criteria in the extracranial cerebrovascular circulation.  ASSESSMENT/PLAN:   89 year old male that vascular surgery was consulted for carotid artery stenosis.  He presented to [facility] after a mechanical fall according to his son at bedside.  He had no neurologic symptoms to suggest stroke.  The son thinks he has never had a stroke.  Duplex shows a high-grade stenosis in the right carotid but no flow-limiting stenosis in the left carotid and antegrade flow in both vertebral arteries.  His carotid artery findings are incidental findings after a CT cervical spine suggest that he had carotid disease following a mechanical fall at home which was the reason for his presentation.  Also found to be in heart block and is being evaluated by cardiology.   Patient's son states patient previously stated he did not want carotid surgery.  Patient is currently obtunded after getting Haldol for agitation and I cannot have any meaningful conversation with him.  Will have to discuss if he wants elective revascularization of his right carotid for stroke risk reduction in the future when he is awake and oriented.     Marty Heck, MD [facility] [place] Office: [place]

## 2022-01-31 DIAGNOSIS — N179 Acute kidney failure, unspecified: Secondary | ICD-10-CM | POA: Diagnosis not present

## 2022-01-31 DIAGNOSIS — I441 Atrioventricular block, second degree: Secondary | ICD-10-CM | POA: Diagnosis not present

## 2022-01-31 DIAGNOSIS — G9341 Metabolic encephalopathy: Secondary | ICD-10-CM

## 2022-01-31 DIAGNOSIS — I459 Conduction disorder, unspecified: Secondary | ICD-10-CM | POA: Diagnosis not present

## 2022-01-31 DIAGNOSIS — Z515 Encounter for palliative care: Secondary | ICD-10-CM

## 2022-01-31 DIAGNOSIS — S32010A Wedge compression fracture of first lumbar vertebra, initial encounter for closed fracture: Secondary | ICD-10-CM | POA: Diagnosis not present

## 2022-01-31 DIAGNOSIS — Z66 Do not resuscitate: Secondary | ICD-10-CM

## 2022-01-31 DIAGNOSIS — I6523 Occlusion and stenosis of bilateral carotid arteries: Secondary | ICD-10-CM | POA: Diagnosis not present

## 2022-01-31 DIAGNOSIS — R55 Syncope and collapse: Secondary | ICD-10-CM | POA: Diagnosis not present

## 2022-01-31 LAB — GLUCOSE, CAPILLARY
Glucose-Capillary: 79 mg/dL (ref 70–99)
Glucose-Capillary: 115 mg/dL — ABNORMAL HIGH (ref 70–99)
Glucose-Capillary: 107 mg/dL — ABNORMAL HIGH (ref 70–99)
Glucose-Capillary: 102 mg/dL — ABNORMAL HIGH (ref 70–99)

## 2022-01-31 LAB — CBC
HCT: 36.7 % — ABNORMAL LOW (ref 39.0–52.0)
Hemoglobin: 12.3 g/dL — ABNORMAL LOW (ref 13.0–17.0)
MCH: 30.9 pg (ref 26.0–34.0)
MCHC: 33.5 g/dL (ref 30.0–36.0)
MCV: 92.2 fL (ref 80.0–100.0)
Platelets: 182 10*3/uL (ref 150–400)
RBC: 3.98 MIL/uL — ABNORMAL LOW (ref 4.22–5.81)
RDW: 13.3 % (ref 11.5–15.5)
WBC: 7.7 10*3/uL (ref 4.0–10.5)
nRBC: 0 % (ref 0.0–0.2)

## 2022-01-31 LAB — COMPREHENSIVE METABOLIC PANEL
ALT: 11 U/L (ref 0–44)
AST: 16 U/L (ref 15–41)
Albumin: 2.5 g/dL — ABNORMAL LOW (ref 3.5–5.0)
Alkaline Phosphatase: 53 U/L (ref 38–126)
Anion gap: 11 (ref 5–15)
BUN: 37 mg/dL — ABNORMAL HIGH (ref 8–23)
CO2: 20 mmol/L — ABNORMAL LOW (ref 22–32)
Calcium: 8.7 mg/dL — ABNORMAL LOW (ref 8.9–10.3)
Chloride: 114 mmol/L — ABNORMAL HIGH (ref 98–111)
Creatinine, Ser: 1.53 mg/dL — ABNORMAL HIGH (ref 0.61–1.24)
GFR, Estimated: 44 mL/min — ABNORMAL LOW (ref >60)
Glucose, Bld: 89 mg/dL (ref 70–99)
Potassium: 2.8 mmol/L — ABNORMAL LOW (ref 3.5–5.1)
Sodium: 145 mmol/L (ref 135–145)
Total Bilirubin: 0.8 mg/dL (ref 0.3–1.2)
Total Protein: 5.3 g/dL — ABNORMAL LOW (ref 6.5–8.1)

## 2022-01-31 LAB — MAGNESIUM: Magnesium: 1.8 mg/dL (ref 1.7–2.4)

## 2022-01-31 MED ORDER — POTASSIUM CHLORIDE CRYS ER 20 MEQ PO TBCR
60.0000 meq | EXTENDED_RELEASE_TABLET | ORAL | Status: AC
  Administered 2022-01-31 (×2): 60 meq via ORAL
  Filled 2022-01-31 (×2): qty 3

## 2022-01-31 MED ORDER — GERHARDT'S BUTT CREAM
TOPICAL_CREAM | Freq: Three times a day (TID) | CUTANEOUS | Status: DC
  Filled 2022-01-31: qty 1

## 2022-01-31 NOTE — Progress Notes (Signed)
Progress Note   Patient: [name] [name] MHD:[id number] DOB: Mar 11, 1932 DOA: 01/27/2022     3 DOS: the patient was seen and examined on 01/31/2022   Brief hospital course:  89 y.o. male with medical history significant of coronary artery disease, carotid stenosis, diabetes mellitus, hypertension, peptic ulcer disease, right bundle branch block, and more presents the ED with a chief complaint of fall.  Patient does not know how he fell.  Daughter at bedside provided most the history.  She reports that he was ambulating with his walker and he said he was moving a box out of the way and fell down.  She did not see the fall, but when she got to him immediately after he had abrasion on the back of his head, and could not get up.  Pt cannot clarify if he had LOC or not.  He was not complaining of chest pain or palpitations according to the daughter.  He had a similar fall a week PTA when the caregiver was there.  He was sore but seemed okay, but started complaining more more of low back pain since that first fall.  On the day of admission, pt had trouble getting up due to pain in his back. For the falls he had incontinence of bowel and bladder which appears to be a chronic issue.   Daughter reports that she has noticed a functional decline in him over the past few months.  She says that he has been more fatigued.  Pt was seen by cardiology who felt the patient had second-degree AV block type I, but could not rule out type II second-degree AV block.  As result, patient is being transferred to [facility] for EP evaluation and for cardiology to follow.    Assessment and Plan: Second-degree AV block -Appreciate cardiology consult -initial concern for heart block -No prolonged pauses or heart block since stopping Toprolol per Cardiology -Noted to have intermittent Mobitz 1 second degree AV block, no indication for pacemaker at this point   Acute metabolic encephalopathy -Patient has developed some hospital  delirium since admission -family had reported cognitive functional decline over last 6 months -B12 580 -TSH 0.978 -UA negative for pyuria -CT brain negative for acute findings -Recent events noted. Became very agitated with staff -Now improved after trial of seroquel overnight. Staff reports pt sleeping better overnight   Close compression fracture L1 -CT lumbar spine shows L1 fracture with 30-40% height loss -TLSO brace -judicious opioids -PT following   Acute on chronic renal failure--CKD stage IIIb -Baseline creatinine 1.4-1.6 -Serum creatinine peaked 2.31 -Holding irbesartan/HCTZ -Cr down to 1.53, still looks dry. Cont IVF as tolerated   Right Carotid stenosis -01/27/22 Carotid ultrasound--R ICA with 70-99% stenosis -11/08/2018 US carotid--R-ICA 60-79% stenosis -Appreciate input by Vascular Surgeon. Today, pt more awake and voiced to Vascular Surgeon that he did now wish to move forward with surgery. Vascular recs to continue plavix and statin   Essential hypertension -Continue amlodipine -Holding metoprolol secondary to heart block -Holding irbesartan secondary to acute kidney injury -BP stable   Controlled diabetes mellitus type 2  -01/27/2022 hemoglobin A1c 5.5 -NovoLog sliding scale -Continue reduced dose Levemir -Glycemic trends stable   Coronary artery disease -No chest pain presently -Continue Plavix -Continue Crestor   Tobacco abuse -Tobacco cessation discussed -nicoderm patch   COPD -Stable on room air   Mixed hyperlipidemia -Continue statin per above   Cervical spine stenosis -01/27/2022 CT cervical spine--multilevel DJD; 7 mm AP thecal sac stenosis C1-2>>appears  unchanged from prior MRI C-spine 07/19/17 -Patient denies any neck pain -no extremity weakness  Hypokalemia -Replaced -Recheck bmet in AM      Subjective: More awake this AM and alert. Pt was without complaints. Family in room reports poor PO intake leading up to this visit  Physical  Exam: Vitals:   01/31/22 0821 01/31/22 1025 01/31/22 1053 01/31/22 1649  BP: (!) 158/76 135/74    Pulse: 80     Resp: 18   18  Temp:   98.7 F (37.1 C) 98.6 F (37 C)  TempSrc:    Oral  SpO2: 100%     Weight:      Height:       General exam: Conversant, in no acute distress Respiratory system: normal chest rise, clear, no audible wheezing Cardiovascular system: regular rhythm, s1-s2 Gastrointestinal system: Nondistended, nontender, pos BS Central nervous system: No seizures, no tremors Extremities: No cyanosis, no joint deformities Skin: No rashes, no pallor Psychiatry: Affect normal // no auditory hallucinations    Data Reviewed:  Labs reviewed: Na 145, K 2.8, Cr 1.53, Mg 1.8, hgb 12.3   Family Communication: Pt in room, family is at bedside  Disposition: Status is: Inpatient Remains inpatient appropriate because: Severity of illness  Planned Discharge Destination:  Unclear at this time    Author: Marylu Lund, MD 01/31/2022 5:36 PM  For on call review [email or web address].

## 2022-01-31 NOTE — Progress Notes (Signed)
Rounding Note    Patient Name: [name] [name] Date of Encounter: 01/31/2022  [place] Cardiologist: Minus Breeding, MD   Subjective   Denies CP or dyspnea  Inpatient Medications    Scheduled Meds:  amLODipine  10 mg Oral Daily   clopidogrel  75 mg Oral Daily   escitalopram  10 mg Oral Daily   heparin  5,000 Units Subcutaneous Q8H   insulin aspart  0-5 Units Subcutaneous QHS   insulin aspart  0-9 Units Subcutaneous TID WC   insulin detemir  7 Units Subcutaneous QHS   lidocaine  1 patch Transdermal Q24H   nicotine  21 mg Transdermal Daily   pantoprazole  40 mg Oral Daily   potassium chloride  60 mEq Oral Q4H   QUEtiapine  25 mg Oral QHS   rosuvastatin  5 mg Oral Daily   Continuous Infusions:  sodium chloride 75 mL/hr at 01/31/22 0343   PRN Meds: acetaminophen **OR** acetaminophen, gabapentin, haloperidol lactate, ondansetron **OR** ondansetron (ZOFRAN) IV, oxyCODONE   Vital Signs    Vitals:   01/30/22 1708 01/30/22 1930 01/30/22 2307 01/31/22 0405  BP: (!) 158/59 138/66 129/69 128/64  Pulse: 63 67 64 64  Resp: '18 17 15 19  '$ Temp: 99.4 F (37.4 C) 97.9 F (36.6 C) 98.5 F (36.9 C) 98 F (36.7 C)  TempSrc: Axillary Axillary Axillary Axillary  SpO2: 92% 98% 99% 99%  Weight:      Height:        Intake/Output Summary (Last 24 hours) at 01/31/2022 0812 Last data filed at 01/31/2022 0343 Gross per 24 hour  Intake 1415.26 ml  Output --  Net 1415.26 ml       01/27/2022    6:25 PM 05/13/2018    9:00 AM 05/07/2018   10:39 AM  Last 3 Weights  Weight (lbs) 165 lb 210 lb 210 lb  Weight (kg) 74.844 kg 95.255 kg 95.255 kg      Telemetry    Sinus rhythm with nonconducted PACs, Mobitz 1 second-degree AV block but no prolonged pauses; transient atrial flutter- Personally Reviewed   Physical Exam   GEN: NAD Neck: supple Cardiac: RRR Respiratory: CTA GI: Soft, NT/ND MS: No edema Neuro:  Grossly intact Psych: Normal affect   Labs      Chemistry Recent Labs  Lab 01/27/22 1947 01/28/22 0503 01/29/22 0726 01/31/22 0316  NA 135 135 139 145  K 3.4* 4.0 3.5 2.8*  CL 101 106 107 114*  CO2 22 20* 19* 20*  GLUCOSE 131* 106* 123* 89  BUN 59* 62* 50* 37*  CREATININE 2.31* 2.16* 1.78* 1.53*  CALCIUM 8.9 8.6* 9.0 8.7*  MG 1.8  1.8  --  1.8  --   PROT 6.9 6.1*  --  5.3*  ALBUMIN 3.6 3.1*  --  2.5*  AST 17 14*  --  16  ALT 12 10  --  11  ALKPHOS 61 50  --  53  BILITOT 0.7 0.8  --  0.8  GFRNONAA 27* 29* 36* 44*  ANIONGAP '12 9 13 11     '$ Hematology Recent Labs  Lab 01/27/22 1947 01/29/22 0726 01/31/22 0316  WBC 17.6* 14.1* 7.7  RBC 4.43 4.47 3.98*  HGB 13.5 13.5 12.3*  HCT 41.0 41.3 36.7*  MCV 92.6 92.4 92.2  MCH 30.5 30.2 30.9  MCHC 32.9 32.7 33.5  RDW 13.4 13.2 13.3  PLT 207 204 182    Thyroid  Recent Labs  Lab 01/28/22 0925  TSH 0.978      Patient Profile     89 y.o. male with past medical history of coronary artery disease, cerebrovascular disease, hypertension, hyperlipidemia, bradycardia for evaluation of bradycardia and fall.  Echocardiogram shows normal LV function, mild left ventricular hypertrophy, mild to moderate left atrial enlargement and mildly dilated aortic root.  Assessment & Plan    1 status post fall-as outlined previously it is unclear if patient had syncope.  He has significant conduction disease on his ECG and is noted to have intermittent Mobitz 1 second-degree AV block.  He has not had prolonged pauses.  Toprol has been discontinued.  No indication for pacemaker at this point.  Can consider outpatient monitor at discharge.  Note LV function is normal.    2 paroxysmal atrial flutter-noted on telemetry.  His heart rate is not elevated and he is back in sinus.  Would continue to hold all AV nodal blocking agents.  Given recurrent falls I do not think he is a good candidate for long-term anticoagulation.    3 coronary artery disease-continue Plavix and statin.    4 carotid  artery disease-per vascular surgery.  5 hypertension-continue amlodipine.   6 acute on chronic stage IIIb kidney disease-continue to follow renal function.  7 lung nodule/dilated aortic root-follow-up as an outpatient.  8 hypokalemia-supplement.  For questions or updates, please contact [place] Please consult [email or web address] for contact info under        Signed, Kirk Ruths, MD  01/31/2022, 8:12 AM

## 2022-01-31 NOTE — Progress Notes (Addendum)
[facility] [place]  Subjective  - arouses  to stimuli, not oriented   Objective 128/64 64 98 F (36.7 C) (Axillary) 19 99%  Intake/Output Summary (Last 24 hours) at 01/31/2022 0754 Last data filed at 01/31/2022 0343 Gross per 24 hour  Intake 1583.84 ml  Output --  Net 1583.84 ml    Extremities warm to touch, no ischemic changes  Non labored breathing Heart RRR  Assessment/Planning:  89 year old male with a history of carotid stenosis   Duplex shows right ICA 70-99% stenosis Pending orientation and the wishes of the patient weather or not he wants to have elective right CEA verse TCAR Will follow for cognitive progress  Roxy Horseman 01/31/2022 7:54 AM --  Laboratory Lab Results: Recent Labs    01/29/22 0726 01/31/22 0316  WBC 14.1* 7.7  HGB 13.5 12.3*  HCT 41.3 36.7*  PLT 204 182   BMET Recent Labs    01/29/22 0726 01/31/22 0316  NA 139 145  K 3.5 2.8*  CL 107 114*  CO2 19* 20*  GLUCOSE 123* 89  BUN 50* 37*  CREATININE 1.78* 1.53*  CALCIUM 9.0 8.7*    COAG Lab Results  Component Value Date   INR 1.1 01/27/2022   INR 1.0 05/13/2018   No results found for: "PTT"  I have seen and evaluated the patient. I agree with the PA note as documented above.  89 year old male that vascular surgery was consulted yesterday for evaluation of carotid artery disease.  As previously noted he has a high-grade right carotid stenosis.  Patient is more awake today and I discussed these findings with the patient.  This is asymptomatic carotid disease as he denies any history of strokes or TIAs.  He feels he is at his neurologic baseline.  He states he has no interest in carotid surgery.  I discussed this would put him at a higher stroke risk without surgical intervention.  His son yesterday stated that his father does not want any carotid surgery based on previous discussions.  I discussed medical management and he is on Plavix statin which  should be appropriate.  Please call vascular if he changes his mind.  He can follow-up with [place] PRN.  Marty Heck, MD [facility] [place] Office: [phone]

## 2022-01-31 NOTE — Progress Notes (Signed)
TR[facility] night cross cover note:   I was contacted by patient's RN regarding clarification on whether or not to hold this evening's dose of Levemir 7 units subcu nightly in the context of fasting blood sugar of 79 on the morning of 01/31/2022 after receiving Levemir on the night prior, with RN also noting interval poor oral intake.  I subsequently conveyed that it is okay to hold this evening's dose of Levemir 7 units subcu.    Babs Bertin, DO Hospitalist

## 2022-01-31 NOTE — Consult Note (Signed)
Consultation Note Date: 01/31/2022   Patient Name: [name] [name]  DOB: [birth year]  MRN: [id number]  Age / Sex: 89 y.o., male  PCP: Prince Solian, MD Referring Physician: Donne Hazel, MD  Reason for Consultation: Establishing goals of care and Psychosocial/spiritual support  HPI/Patient Profile: 89 y.o. male   admitted on 01/27/2022 with 89 y.o. male with medical history significant of coronary artery disease, carotid stenosis, diabetes mellitus, hypertension, peptic ulcer disease, right bundle branch block, and more presents the ED with a chief complaint of fall.  Patient does not know how he fell.  Daughter at bedside provided most the history.  She reports that he was ambulating with his walker and he said he was moving a box out of the way and fell down.  She did not see the fall, but when she got to him immediately after he had abrasion on the back of his head, and could not get up.  Pt cannot clarify if he had LOC or not.  He was not complaining of chest pain or palpitations according to the daughter.  He had a similar fall a week PTA when the caregiver was there.  He was sore but seemed okay, but started complaining more more of low back pain since that first fall.  On the day of admission, pt had trouble getting up due to pain in his back. For the falls he had incontinence of bowel and bladder which appears to be a chronic issue.   Daughter reports that she has noticed a functional decline in him over the past few months.   Pt was seen by cardiology who felt the patient had second-degree AV block type I, but could not rule out type II second-degree AV block.  As result, patient is being transferred to [facility] for EP evaluation and for cardiology to follow.   Patient and family face treatment option decisions, advanced directive decisions and anticipatory care needs.  Clinical Assessment and Goals of  Care:  This NP Wadie Lessen reviewed medical records, received report from team, assessed the patient and then from the patient's bedside to his daughter/Donell Walker by telephone to discuss diagnosis, prognosis, GOC, EOL wishes disposition and options.   Concept of Palliative Care was introduced as specialized medical care for people and their families living with serious illness.  If focuses on providing relief from the symptoms and stress of a serious illness.  The goal is to improve quality of life for both the patient and the family.  Values and goals of care important to patient and family were attempted to be elicited.   Created space and opportunity for family to explore thoughts and feelings regarding current medical situation. H POA  [name] is the main caregiver of both patient/her father and also her mother who has health needs also in their home.  She expresses concern over anticipatory care needs for her father on discharge.  She recognizes that the care needs for both of her parents is becoming more demanding.  A  discussion was had today regarding advanced directives.  Concepts specific to code status, artifical feeding and hydration, continued IV antibiotics and rehospitalization was had.     Daughter verbalizes her father's desire for DNR/DNI status.  I will document today.   The difference between a aggressive medical intervention path  and a palliative comfort care path for this patient at this time was had.   It will be important for ongoing goals of care conversations and support for family as they make decisions regarding next steps in treatment plan.  Recognizing that there are multiple other siblings who participate in decisions for Mr. Mezera a family meeting is scheduled for Sunday at 1200.   Questions and concerns addressed.     Family  encouraged to call with questions or concerns.     PMT will continue to support holistically.        HCPOA/Donnell Walker/  daughter    SUMMARY OF RECOMMENDATIONS    Code Status/Advance Care Planning: DNR-documented today    Palliative Prophylaxis:  Aspiration, Bowel Regimen, Delirium Protocol, Frequent Pain Assessment, and Oral Care  Additional Recommendations (Limitations, Scope, Preferences): Full Scope Treatment  Psycho-social/Spiritual:  Desire for further Chaplaincy support:no Additional Recommendations: emotional support  Prognosis:  Unable to determine  Discharge Planning: To Be Determined      Primary Diagnoses: Present on Admission:  Heart block  Acute renal failure superimposed on stage 3b chronic kidney disease ([place])  Coronary atherosclerosis  Essential hypertension   I have reviewed the medical record, interviewed the patient and family, and examined the patient. The following aspects are pertinent.  Past Medical History:  Diagnosis Date   Bronchitis    Hx of   CAD (coronary artery disease)    (2005 LAD 30-40% stenosis  followed by 60-65% focal stenosis.  The second diagonal had 80%   stenosis.  The circumflex had 85-90% mid stenosis and was stented with a  Taxus stent.  The obtuse marginal had 20-25% stenosis and obtuse   marginal-2 had 40-50% stenosis.  The right coronary artery had 40-50%  proximal stenosis and mid 75% stenosis), well-preserved ejection  fraction (50-55% )   Carotid stenosis    History of colonic polyps    HTN (hypertension)    Myocardial infarction (HCC)    Obesity    Prostate cancer ([place]) 2001   Radiation    PUD (peptic ulcer disease)    Radiation proctitis    Right bundle branch block    Sigmoid diverticulosis    Skin cancer    Sleep apnea    Tobacco abuse    Type 2 diabetes mellitus ([place])    Social History   Socioeconomic History   Marital status: Married    Spouse name: Not on file   Number of children: 4   Years of education: Not on file   Highest education level: Not on file  Occupational History   Not on file  Tobacco Use    Smoking status: Every Day    Packs/day: 1.00    Years: 60.00    Total pack years: 60.00    Types: Cigarettes   Smokeless tobacco: Never  Vaping Use   Vaping Use: Never used  Substance and Sexual Activity   Alcohol use: No   Drug use: No   Sexual activity: Never  Other Topics Concern   Not on file  Social History Narrative   Not on file   Social Determinants of Health   Financial Resource Strain: Not  on file  Food Insecurity: Not on file  Transportation Needs: Not on file  Physical Activity: Not on file  Stress: Not on file  Social Connections: Not on file   Family History  Problem Relation Age of Onset   Cancer Father    Coronary artery disease Father 31   Coronary artery disease Brother 80   Coronary artery disease Sister 10   Stroke Mother 83   Scheduled Meds:  amLODipine  10 mg Oral Daily   clopidogrel  75 mg Oral Daily   escitalopram  10 mg Oral Daily   heparin  5,000 Units Subcutaneous Q8H   insulin aspart  0-5 Units Subcutaneous QHS   insulin aspart  0-9 Units Subcutaneous TID WC   insulin detemir  7 Units Subcutaneous QHS   lidocaine  1 patch Transdermal Q24H   nicotine  21 mg Transdermal Daily   pantoprazole  40 mg Oral Daily   potassium chloride  60 mEq Oral Q4H   rosuvastatin  5 mg Oral Daily   Continuous Infusions:  sodium chloride 75 mL/hr at 01/31/22 1150   PRN Meds:.acetaminophen **OR** acetaminophen, gabapentin, haloperidol lactate, ondansetron **OR** ondansetron (ZOFRAN) IV, oxyCODONE Medications Prior to Admission:  Prior to Admission medications   Medication Sig Start Date End Date Taking? Authorizing Provider  acetaminophen (TYLENOL) 325 MG tablet Take 650 mg by mouth every 6 (six) hours as needed.   Yes [provider]  amLODipine (NORVASC) 5 MG tablet Take 5 mg by mouth daily.  11/12/17  Yes [provider]  cholestyramine (QUESTRAN) 4 g packet Take 1 packet by mouth 2 (two) times daily. 11/08/21  Yes [provider]  clopidogrel (PLAVIX) 75 MG tablet Take 1 tablet (75 mg total) by mouth daily. 01/31/14  Yes Minus Breeding, MD  diphenoxylate-atropine (LOMOTIL) 2.5-0.025 MG tablet Take 1 tablet by mouth 4 (four) times daily as needed for diarrhea or loose stools.   Yes [provider]  escitalopram (LEXAPRO) 10 MG tablet Take 10 mg by mouth daily. 12/02/21  Yes [provider]  ferrous sulfate 325 (65 FE) MG EC tablet Take 325 mg by mouth daily with breakfast.    Yes [provider]  ibuprofen (ADVIL,MOTRIN) 200 MG tablet Take 400 mg by mouth every 6 (six) hours as needed (pain).    Yes [provider]  irbesartan-hydrochlorothiazide (AVALIDE) 300-12.5 MG per tablet Take 1 tablet by mouth daily. 01/31/14  Yes Minus Breeding, MD  LANTUS SOLOSTAR 100 UNIT/ML Solostar Pen Inject 30-36 Units into the skin at bedtime. Sliding scale 12/01/17  Yes [provider]  metoprolol succinate (TOPROL-XL) 25 MG 24 hr tablet TAKE 1 TABLET BY MOUTH TWICE DAILY Patient taking differently: Take 25 mg by mouth daily. TAKE 1 TABLET BY MOUTH TWICE DAILY 01/31/14  Yes Minus Breeding, MD  omeprazole (PRILOSEC) 20 MG capsule Take 20 mg by mouth daily.     Yes [provider]  rosuvastatin (CRESTOR) 5 MG tablet Take 5 mg by mouth daily.  11/12/17  Yes [provider]  TRADJENTA 5 MG TABS tablet Take 5 mg by mouth daily.  11/24/17  Yes [provider]  gabapentin (NEURONTIN) 100 MG capsule Take 1 capsule by mouth at bedtime as needed (leg pain).  Patient not taking: Reported on 01/28/2022 11/12/17   [provider]   No Known Allergies Review of Systems  Unable to perform ROS: Mental status change    Physical Exam Constitutional:      Appearance: He  is normal weight. He is ill-appearing.  Cardiovascular:     Rate and Rhythm: Normal rate.  Pulmonary:     Effort: Pulmonary effort is normal.  Skin:    General: Skin is warm and dry.  Neurological:      Mental Status: He is lethargic.  Psychiatric:        Cognition and Memory: Cognition is impaired.     Vital Signs: BP 135/74   Pulse 80   Temp 98.7 F (37.1 C)   Resp 18   Ht '5\' 3"'$  (1.6 m)   Wt 74.8 kg   SpO2 100%   BMI 29.23 kg/m  Pain Scale: Faces   Pain Score: 0-No pain   SpO2: SpO2: 100 % O2 Device:SpO2: 100 % O2 Flow Rate: .   IO: Intake/output summary:  Intake/Output Summary (Last 24 hours) at 01/31/2022 1215 Last data filed at 01/31/2022 1150 Gross per 24 hour  Intake 2027.24 ml  Output --  Net 2027.24 ml    LBM: Last BM Date : 01/30/22 Baseline Weight: Weight: 74.8 kg Most recent weight: Weight: 74.8 kg     Palliative Assessment/Data: 30 % at best today    Discussed with Dr Wyline Copas  Time In: 1230 Time Out: 1345  Time Total: 75 minutes Greater than 50%  of this time was spent counseling and coordinating care related to the above assessment and plan.  Signed by: Wadie Lessen, NP   Please contact Palliative Medicine Team phone at [phone] for questions and concerns.  For individual provider: See Shea Evans

## 2022-01-31 NOTE — Care Management (Signed)
  Transition of Care [facility]) Screening Note   Patient Details  Name: [name] [name] Date of Birth: 06-Dec-1932   Transition of Care [facility] CM/SW Contact:    Bethena Roys, RN Phone Number: 01/31/2022, 1:35 PM    Transition of Care Department [facility]) has reviewed the patient. Patient was transferred from [facility]. Case Manager received a secure chat from the Palliative NP regarding [place] meeting scheduled for Sunday. NP spoke with daughter Dory Horn. Case Manager will continue to follow for transition of care needs as the patient progresses. TOC will continue to monitor patient advancement through interdisciplinary progression rounds. If new patient transition needs arise, please place a TOC consult.

## 2022-01-31 NOTE — Plan of Care (Signed)
  Problem: Clinical Measurements: Goal: Respiratory complications will improve Outcome: Progressing   Problem: Clinical Measurements: Goal: Cardiovascular complication will be avoided Outcome: Progressing   Problem: Clinical Measurements: Goal: Ability to maintain clinical measurements within normal limits will improve Outcome: Progressing   Problem: Activity: Goal: Risk for activity intolerance will decrease Outcome: Progressing   Problem: Nutrition: Goal: Adequate nutrition will be maintained Outcome: Progressing   Problem: Coping: Goal: Level of anxiety will decrease Outcome: Progressing   Problem: Elimination: Goal: Will not experience complications related to urinary retention Outcome: Progressing   Problem: Pain Managment: Goal: General experience of comfort will improve Outcome: Progressing   Problem: Safety: Goal: Ability to remain free from injury will improve Outcome: Progressing   Problem: Skin Integrity: Goal: Risk for impaired skin integrity will decrease Outcome: Progressing

## 2022-01-31 NOTE — Progress Notes (Signed)
PT Cancellation Note  Patient Details Name: [name] [name] MRN: [id number] DOB: August 29, 1932   Cancelled Treatment:    Reason Eval/Treat Not Completed: Medical issues which prohibited therapy.  Awaiting instructions for lumbar fracture and brace, will reattempt at another time.   Ramond Dial 01/31/2022, 11:36 AM  Mee Hives, PT PhD Acute Rehab Dept. Number: [place] [facility] [place]

## 2022-01-31 NOTE — Care Management Important Message (Signed)
Important Message  Patient Details  Name: [name] [name] MRN: [id number] Date of Birth: October 31, 1932   Medicare Important Message Given:  Yes     [name] 01/31/2022, 9:08 AM

## 2022-02-01 DIAGNOSIS — S32010A Wedge compression fracture of first lumbar vertebra, initial encounter for closed fracture: Secondary | ICD-10-CM | POA: Diagnosis not present

## 2022-02-01 DIAGNOSIS — I441 Atrioventricular block, second degree: Secondary | ICD-10-CM | POA: Diagnosis not present

## 2022-02-01 DIAGNOSIS — I459 Conduction disorder, unspecified: Secondary | ICD-10-CM | POA: Diagnosis not present

## 2022-02-01 LAB — BASIC METABOLIC PANEL
Anion gap: 10 (ref 5–15)
BUN: 33 mg/dL — ABNORMAL HIGH (ref 8–23)
CO2: 19 mmol/L — ABNORMAL LOW (ref 22–32)
Calcium: 8.8 mg/dL — ABNORMAL LOW (ref 8.9–10.3)
Chloride: 118 mmol/L — ABNORMAL HIGH (ref 98–111)
Creatinine, Ser: 1.46 mg/dL — ABNORMAL HIGH (ref 0.61–1.24)
GFR, Estimated: 46 mL/min — ABNORMAL LOW (ref >60)
Glucose, Bld: 99 mg/dL (ref 70–99)
Potassium: 4.1 mmol/L (ref 3.5–5.1)
Sodium: 147 mmol/L — ABNORMAL HIGH (ref 135–145)

## 2022-02-01 LAB — GLUCOSE, CAPILLARY
Glucose-Capillary: 103 mg/dL — ABNORMAL HIGH (ref 70–99)
Glucose-Capillary: 144 mg/dL — ABNORMAL HIGH (ref 70–99)
Glucose-Capillary: 106 mg/dL — ABNORMAL HIGH (ref 70–99)
Glucose-Capillary: 140 mg/dL — ABNORMAL HIGH (ref 70–99)

## 2022-02-01 LAB — CBC
HCT: 38 % — ABNORMAL LOW (ref 39.0–52.0)
Hemoglobin: 12.5 g/dL — ABNORMAL LOW (ref 13.0–17.0)
MCH: 30.8 pg (ref 26.0–34.0)
MCHC: 32.9 g/dL (ref 30.0–36.0)
MCV: 93.6 fL (ref 80.0–100.0)
Platelets: 205 10*3/uL (ref 150–400)
RBC: 4.06 MIL/uL — ABNORMAL LOW (ref 4.22–5.81)
RDW: 13.4 % (ref 11.5–15.5)
WBC: 9.3 10*3/uL (ref 4.0–10.5)
nRBC: 0 % (ref 0.0–0.2)

## 2022-02-01 LAB — MAGNESIUM: Magnesium: 1.7 mg/dL (ref 1.7–2.4)

## 2022-02-01 MED ORDER — LACTATED RINGERS IV SOLN: INTRAVENOUS | Status: DC

## 2022-02-01 MED ORDER — MAGNESIUM SULFATE 2 GM/50ML IV SOLN
2.0000 g | Freq: Once | INTRAVENOUS | Status: AC
  Administered 2022-02-01: 2 g via INTRAVENOUS
  Filled 2022-02-01: qty 50

## 2022-02-01 NOTE — Progress Notes (Signed)
Progress Note   Patient: SHANTE MAYSONET [id number]:[id number] DOB: [birth year] DOA: 01/27/2022     4 DOS: the patient was seen and examined on 02/01/2022   Brief hospital course:  89 y.o. male with medical history significant of coronary artery disease, carotid stenosis, diabetes mellitus, hypertension, peptic ulcer disease, right bundle branch block, and more presents the ED with a chief complaint of fall.  Patient does not know how he fell.  Daughter at bedside provided most the history.  She reports that he was ambulating with his walker and he said he was moving a box out of the way and fell down.  She did not see the fall, but when she got to him immediately after he had abrasion on the back of his head, and could not get up.  Pt cannot clarify if he had LOC or not.  He was not complaining of chest pain or palpitations according to the daughter.  He had a similar fall a week PTA when the caregiver was there.  He was sore but seemed okay, but started complaining more more of low back pain since that first fall.  On the day of admission, pt had trouble getting up due to pain in his back. For the falls he had incontinence of bowel and bladder which appears to be a chronic issue.   Daughter reports that she has noticed a functional decline in him over the past few months.  She says that he has been more fatigued.  Pt was seen by cardiology who felt the patient had second-degree AV block type I, but could not rule out type II second-degree AV block.  As result, patient is being transferred to [facility] for EP evaluation and for cardiology to follow.    Assessment and Plan: Second-degree AV block -Appreciate cardiology consult -initial concern for heart block -No prolonged pauses or heart block since stopping Toprolol per Cardiology -Noted to have intermittent Mobitz 1 second degree AV block, no indication for pacemaker at this point.   Paroxysmal aflutter -currently rate controlled at present -Cont to avoid  AV nodal agents  -Doubt candidacy for anticoagulation given fall risk   Acute metabolic encephalopathy -Patient has developed some hospital delirium since admission -family had reported cognitive functional decline over last 6 months -B12 580 -TSH 0.978 -UA negative for pyuria -CT brain negative for acute findings -Mentation seems much improved this AM. Conversing more appropriately   Close compression fracture L1 -CT lumbar spine shows L1 fracture with 30-40% height loss -TLSO brace when up with PT -judicious opioids -Therapy following, thus far recs for SNF   Acute on chronic renal failure--CKD stage IIIb -Baseline creatinine 1.4-1.6 -Serum creatinine peaked 2.31 -Holding irbesartan/HCTZ -Cr down to 1.46 with basal IVF -recheck bmet in AM   Right Carotid stenosis -01/27/22 Carotid ultrasound--R ICA with 70-99% stenosis -11/08/2018 US carotid--R-ICA 60-79% stenosis -Appreciate input by Vascular Surgeon. While awake, pt voiced to Vascular Surgeon that he did now wish to move forward with surgery. Vascular recs to continue plavix and statin   Essential hypertension -Continue amlodipine -Holding metoprolol secondary to heart block -Holding irbesartan secondary to acute kidney injury -BP stable   Controlled diabetes mellitus type 2  -01/27/2022 hemoglobin A1c 5.5 -NovoLog sliding scale -Continue reduced dose Levemir -Glycemic trends stable   Coronary artery disease -No chest pain presently -Continue Plavix -Continue Crestor   Tobacco abuse -Tobacco cessation discussed -nicoderm patch   COPD -Stable on room air   Mixed hyperlipidemia -Continue statin per  above   Cervical spine stenosis -01/27/2022 CT cervical spine--multilevel DJD; 7 mm AP thecal sac stenosis C1-2>>appears unchanged from prior MRI C-spine 07/19/17 -Patient denies any neck pain -no extremity weakness  Hypokalemia -corrected -Recheck bmet in AM  Hypomagnesemia -Will replace      Subjective:  Reports feeling better without concerns. Eager to go home soon to his wife as her caregive  Physical Exam: Vitals:   02/01/22 0029 02/01/22 0524 02/01/22 0934 02/01/22 1300  BP: (!) 141/69 (!) 164/94 (!) 164/81 133/61  Pulse: 79 78 76 76  Resp: '19 19 16 17  '$ Temp: 97.8 F (36.6 C) 98.9 F (37.2 C) (!) 97.5 F (36.4 C) 98.3 F (36.8 C)  TempSrc: Oral Oral Oral Oral  SpO2: 99% 99% 99% 98%  Weight:      Height:       General exam: Awake, laying in bed, in nad Respiratory system: Normal respiratory effort, no wheezing Cardiovascular system: regular rate, s1, s2 Gastrointestinal system: Soft, nondistended, positive BS Central nervous system: CN2-12 grossly intact, strength intact Extremities: Perfused, no clubbing Skin: Normal skin turgor, no notable skin lesions seen Psychiatry: Mood normal // no visual hallucinations   Data Reviewed:  Labs reviewed: Na 147, K 4.1, Cr 1.46, Mg 1.7, hgb 12.5   Family Communication: Pt in room, family is not at bedside  Disposition: Status is: Inpatient Remains inpatient appropriate because: Severity of illness  Planned Discharge Destination:  Unclear at this time    Author: Marylu Lund, MD 02/01/2022 3:54 PM  For on call review [email or web address].

## 2022-02-01 NOTE — Progress Notes (Signed)
Rounding Note    Patient Name: [name] [name] Date of Encounter: 02/01/2022  [facility] Cardiologist: Minus Breeding, MD   Subjective   Pt denies CP or dyspnea  Inpatient Medications    Scheduled Meds:  amLODipine  10 mg Oral Daily   clopidogrel  75 mg Oral Daily   escitalopram  10 mg Oral Daily   Gerhardt's butt cream   Topical TID   heparin  5,000 Units Subcutaneous Q8H   insulin aspart  0-5 Units Subcutaneous QHS   insulin aspart  0-9 Units Subcutaneous TID WC   insulin detemir  7 Units Subcutaneous QHS   lidocaine  1 patch Transdermal Q24H   nicotine  21 mg Transdermal Daily   pantoprazole  40 mg Oral Daily   rosuvastatin  5 mg Oral Daily   Continuous Infusions:  sodium chloride 75 mL/hr at 02/01/22 0545   PRN Meds: acetaminophen **OR** acetaminophen, gabapentin, haloperidol lactate, ondansetron **OR** ondansetron (ZOFRAN) IV, oxyCODONE   Vital Signs    Vitals:   01/31/22 1649 01/31/22 2021 02/01/22 0029 02/01/22 0524  BP: (!) 152/69 (!) 143/68 (!) 141/69 (!) 164/94  Pulse: 77 83 79 78  Resp: '18 18 19 19  '$ Temp: 98.6 F (37 C) 98.2 F (36.8 C) 97.8 F (36.6 C) 98.9 F (37.2 C)  TempSrc: Oral Axillary Oral Oral  SpO2: 99% 100% 99% 99%  Weight:      Height:        Intake/Output Summary (Last 24 hours) at 02/01/2022 0813 Last data filed at 02/01/2022 0548 Gross per 24 hour  Intake 2285.27 ml  Output 1300 ml  Net 985.27 ml      01/27/2022    6:25 PM 05/13/2018    9:00 AM 05/07/2018   10:39 AM  Last 3 Weights  Weight (lbs) 165 lb 210 lb 210 lb  Weight (kg) 74.844 kg 95.255 kg 95.255 kg      Telemetry    Sinus rhythm with occasional Mobitz 1 second-degree AV block, occasional junctional escape beat and paroxysmal atrial flutter- Personally Reviewed    Physical Exam   GEN: No acute distress.   Neck: No JVD Cardiac: Irregular Respiratory: Clear to auscultation bilaterally. GI: Soft, nontender, non-distended  MS: No edema Neuro:   Nonfocal  Psych: Normal affect   Labs     Chemistry Recent Labs  Lab 01/27/22 1947 01/28/22 0503 01/29/22 0726 01/31/22 0316 02/01/22 0122  NA 135 135 139 145 147*  K 3.4* 4.0 3.5 2.8* 4.1  CL 101 106 107 114* 118*  CO2 22 20* 19* 20* 19*  GLUCOSE 131* 106* 123* 89 99  BUN 59* 62* 50* 37* 33*  CREATININE 2.31* 2.16* 1.78* 1.53* 1.46*  CALCIUM 8.9 8.6* 9.0 8.7* 8.8*  MG 1.8  1.8  --  1.8 1.8 1.7  PROT 6.9 6.1*  --  5.3*  --   ALBUMIN 3.6 3.1*  --  2.5*  --   AST 17 14*  --  16  --   ALT 12 10  --  11  --   ALKPHOS 61 50  --  53  --   BILITOT 0.7 0.8  --  0.8  --   GFRNONAA 27* 29* 36* 44* 46*  ANIONGAP '12 9 13 11 10     '$ Hematology Recent Labs  Lab 01/29/22 0726 01/31/22 0316 02/01/22 0122  WBC 14.1* 7.7 9.3  RBC 4.47 3.98* 4.06*  HGB 13.5 12.3* 12.5*  HCT 41.3 36.7* 38.0*  MCV 92.4 92.2 93.6  MCH 30.2 30.9 30.8  MCHC 32.7 33.5 32.9  RDW 13.2 13.3 13.4  PLT 204 182 205   Thyroid  Recent Labs  Lab 01/28/22 0925  TSH 0.978     Patient Profile     89 y.o. male with past medical history of coronary artery disease, cerebrovascular disease, hypertension, hyperlipidemia, bradycardia for evaluation of bradycardia and fall.  Echocardiogram shows normal LV function, mild left ventricular hypertrophy, mild to moderate left atrial enlargement and mildly dilated aortic root.  Also noted to have transient atrial fibrillation/flutter.  Assessment & Plan    1 status post fall-as outlined previously it is unclear if patient had syncope.  He has significant conduction disease on his ECG and is noted to have intermittent Mobitz 1 second-degree AV block.  He has not had prolonged pauses.  Toprol has been discontinued.  No indication for pacemaker at this point.  Can consider outpatient monitor at discharge.  Note LV function is normal.     2 paroxysmal atrial flutter-patient back in atrial flutter this morning.  However he is asymptomatic and heart rate is not elevated.   Continue to avoid AV nodal blocking agents.  Given history of recurrent falls I do not think he is a good candidate for long-term anticoagulation (risk outweighs benefit).     3 coronary artery disease-continue Plavix and statin.     4 carotid artery disease-per vascular surgery.   5 hypertension-continue amlodipine.    6 acute on chronic stage IIIb kidney disease-continue to follow renal function.   7 lung nodule/dilated aortic root-follow-up as an outpatient.    For questions or updates, please contact [place] Please consult [email or web address] for contact info under        Signed, Kirk Ruths, MD  02/01/2022, 8:13 AM

## 2022-02-01 NOTE — Evaluation (Signed)
Physical Therapy Evaluation Patient Details Name: [name] [name] MRN: [id number] DOB: [birth year] Today's Date: 02/01/2022  History of Present Illness  89 y.o. male ademitted 1/22 after a fall at home. Found to have Second-degree AV block, Acute metabolic encephalopathy, Closed compression fracture L1,and Acute on chronic renal failure. PMHx:  CAD, HTN, CKD3b, lung nodule, hypokalemia, R BBB.  Clinical Impression  Pt admitted with above diagnosis. Required min assist for bed mobility, sit<>stand transfers, and pivotal steps to [facility]. Episode of bowel incontinence while standing - assisted with pericare. Pt able to progress to min guard with standing while holding with RW for support. Ideally would undergo ST SNF to reduce fall risk and improve independence prior to returning home however pt refusing and states he will arrange aides to assist at home. Encouraged to consider decision further due to high fall risk with hx of multiple falls PTA, and now with L1 compression fracture. Pt currently with functional limitations due to the deficits listed below (see PT Problem List). Pt will benefit from skilled PT to increase their independence and safety with mobility to allow discharge to the venue listed below.          Recommendations for follow up therapy are one component of a multi-disciplinary discharge planning process, led by the attending physician.  Recommendations may be updated based on patient status, additional functional criteria and insurance authorization.  Follow Up Recommendations Skilled nursing-short term rehab (<3 hours/day) (Likely to refuse- would maximize [facility] services including HHPT) Can patient physically be transported by private vehicle: Yes    Assistance Recommended at Discharge Frequent or constant Supervision/Assistance  Patient can return home with the following  A lot of help with walking and/or transfers;A lot of help with bathing/dressing/bathroom;Assistance with  cooking/housework;Assist for transportation;Help with stairs or ramp for entrance    Equipment Recommendations None recommended by PT  Recommendations for Other Services       Functional Status Assessment Patient has had a recent decline in their functional status and demonstrates the ability to make significant improvements in function in a reasonable and predictable amount of time.     Precautions / Restrictions Precautions Precautions: Back;Fall Precaution Booklet Issued:  (Reviewed verbally) Restrictions Weight Bearing Restrictions: No      Mobility  Bed Mobility Overal bed mobility: Needs Assistance Bed Mobility: Rolling, Sidelying to Sit Rolling: Min assist Sidelying to sit: Min assist       General bed mobility comments: Min assist to roll and use rail, educted on log roll technique to protect compression fracture. Min assist for trunk support to rise to EOB.    Transfers Overall transfer level: Needs assistance Equipment used: Rolling walker (2 wheels) Transfers: Sit to/from Stand, Bed to chair/wheelchair/BSC Sit to Stand: Min assist Stand pivot transfers: Min assist Step pivot transfers: Min assist       General transfer comment: Min assist for sit<>stand, leaning posteriorly, unsteady and slow to rise. Cues for safety, hand placement and to lean forward over RW for support. Performed from bed and BSC. Pivot transfer to [facility], and lateral steps to chair with min assist for RW control and balance throughout. Pt incontinent of stool during transfer to [facility].    Ambulation/Gait               General Gait Details: Deferred due to bowel incontinence  Stairs            Wheelchair Mobility    Modified Rankin (Stroke Patients Only)  Balance Overall balance assessment: Needs assistance Sitting-balance support: No upper extremity supported, Feet supported Sitting balance-Leahy Scale: Fair     Standing balance support: Bilateral upper extremity  supported, Reliant on assistive device for balance Standing balance-Leahy Scale: Poor Standing balance comment: Stood for several minutes for pericare initially min assist to remain balanced with RW support. Progressed to min guard for short periods of time.                             Pertinent Vitals/Pain Pain Assessment Pain Assessment: 0-10 Pain Score: 3  Pain Location: back Pain Descriptors / Indicators: Aching Pain Intervention(s): Monitored during session, Repositioned    Home Living Family/patient expects to be discharged to:: Private residence Living Arrangements: Spouse/significant other Available Help at Discharge: Family;Personal care attendant (Wife has 24/7 aides, pt plans to get aides to assist him as much as needed.) Type of Home: House Home Access: Ramped entrance Entrance Stairs-Rails: Left     Home Layout: One level Home Equipment: Conservation officer, nature (2 wheels);Cane - single point;Grab bars - tub/shower;Shower seat;Wheelchair - manual      Prior Function Prior Level of Function : Needs assist;History of Falls (last six months)             Mobility Comments: Mostly using SPC, and RW, but forgetful and not using regularly. Multiple falls. ADLs Comments: States he needed help buttoning shirt. But bath/dress himself     Hand Dominance   Dominant Hand: Right    Extremity/Trunk Assessment   Upper Extremity Assessment Upper Extremity Assessment: Defer to OT evaluation    Lower Extremity Assessment Lower Extremity Assessment: Generalized weakness       Communication   Communication: HOH  Cognition Arousal/Alertness: Awake/alert Behavior During Therapy: WFL for tasks assessed/performed Overall Cognitive Status: Within Functional Limits for tasks assessed Area of Impairment: Safety/judgement                         Safety/Judgement: Decreased awareness of safety, Decreased awareness of deficits     General Comments: Feels that  he will be fine at home with some help from an aide.        General Comments General comments (skin integrity, edema, etc.): Assisted with hygiene care after episodes of bowel incontince while transferring. VSS on RA during evaluation and tx.    Exercises     Assessment/Plan    PT Assessment Patient needs continued PT services  PT Problem List Decreased strength;Decreased range of motion;Decreased activity tolerance;Decreased balance;Decreased mobility;Decreased cognition;Decreased knowledge of use of DME;Decreased safety awareness;Decreased knowledge of precautions;Pain       PT Treatment Interventions DME instruction;Gait training;Functional mobility training;Therapeutic activities;Balance training;Therapeutic exercise;Neuromuscular re-education;Patient/family education    PT Goals (Current goals can be found in the Care Plan section)  Acute Rehab PT Goals Patient Stated Goal: Go home, will not go to rehab PT Goal Formulation: With patient Time For Goal Achievement: 02/14/22 Potential to Achieve Goals: Good    Frequency Min 3X/week     Co-evaluation               AM-PAC PT "6 Clicks" Mobility  Outcome Measure Help needed turning from your back to your side while in a flat bed without using bedrails?: A Little Help needed moving from lying on your back to sitting on the side of a flat bed without using bedrails?: A Little Help needed moving to and from a  bed to a chair (including a wheelchair)?: A Little Help needed standing up from a chair using your arms (e.g., wheelchair or bedside chair)?: A Little Help needed to walk in hospital room?: A Lot Help needed climbing 3-5 steps with a railing? : Total 6 Click Score: 15    End of Session Equipment Utilized During Treatment: Gait belt Activity Tolerance: Patient tolerated treatment well;Other (comment) (Ambulation limited by bowel incontinence) Patient left: in chair;with call bell/phone within reach;with chair alarm  set;with family/visitor present Nurse Communication: Mobility status;Other (comment) (Discussed with Network engineer, pt needs recliner for room.) PT Visit Diagnosis: Unsteadiness on feet (R26.81);Other abnormalities of gait and mobility (R26.89);Repeated falls (R29.6);Muscle weakness (generalized) (M62.81);History of falling (Z91.81);Difficulty in walking, not elsewhere classified (R26.2);Pain Pain - part of body:  (back)    Time: 9417-4081 PT Time Calculation (min) (ACUTE ONLY): 46 min   Charges:   PT Evaluation $PT Eval Moderate Complexity: 1 Mod PT Treatments $Therapeutic Activity: 23-37 mins        Candie Mile, PT, DPT Physical Therapist Acute Rehabilitation Services [place]   Ellouise Newer 02/01/2022, 1:30 PM

## 2022-02-01 NOTE — Progress Notes (Signed)
Alert and oriented x 4, patient refused rectal tube.

## 2022-02-01 NOTE — Progress Notes (Addendum)
Spoke to [employer] (daughter) over the phone regarding patient's status the whole night. She also authorized the RN to give information over phone about the patient to [employer] (Daughter), Drexall Thelander (Son) and [place] only. The RN encouraged [name] to set up a password and the listed relatives need to give it before the RN releases information. She acknowledged. No further concerns. Will hand it over to next RN.

## 2022-02-02 DIAGNOSIS — R531 Weakness: Secondary | ICD-10-CM

## 2022-02-02 DIAGNOSIS — S32010D Wedge compression fracture of first lumbar vertebra, subsequent encounter for fracture with routine healing: Secondary | ICD-10-CM | POA: Diagnosis not present

## 2022-02-02 DIAGNOSIS — G9341 Metabolic encephalopathy: Secondary | ICD-10-CM | POA: Diagnosis not present

## 2022-02-02 DIAGNOSIS — S32010A Wedge compression fracture of first lumbar vertebra, initial encounter for closed fracture: Secondary | ICD-10-CM | POA: Diagnosis not present

## 2022-02-02 DIAGNOSIS — N179 Acute kidney failure, unspecified: Secondary | ICD-10-CM | POA: Diagnosis not present

## 2022-02-02 DIAGNOSIS — I441 Atrioventricular block, second degree: Secondary | ICD-10-CM | POA: Diagnosis not present

## 2022-02-02 DIAGNOSIS — I459 Conduction disorder, unspecified: Secondary | ICD-10-CM | POA: Diagnosis not present

## 2022-02-02 LAB — COMPREHENSIVE METABOLIC PANEL
ALT: 12 U/L (ref 0–44)
AST: 14 U/L — ABNORMAL LOW (ref 15–41)
Albumin: 2.3 g/dL — ABNORMAL LOW (ref 3.5–5.0)
Alkaline Phosphatase: 59 U/L (ref 38–126)
Anion gap: 9 (ref 5–15)
BUN: 30 mg/dL — ABNORMAL HIGH (ref 8–23)
CO2: 21 mmol/L — ABNORMAL LOW (ref 22–32)
Calcium: 8.6 mg/dL — ABNORMAL LOW (ref 8.9–10.3)
Chloride: 111 mmol/L (ref 98–111)
Creatinine, Ser: 1.36 mg/dL — ABNORMAL HIGH (ref 0.61–1.24)
GFR, Estimated: 50 mL/min — ABNORMAL LOW (ref >60)
Glucose, Bld: 135 mg/dL — ABNORMAL HIGH (ref 70–99)
Potassium: 3.4 mmol/L — ABNORMAL LOW (ref 3.5–5.1)
Sodium: 141 mmol/L (ref 135–145)
Total Bilirubin: 0.5 mg/dL (ref 0.3–1.2)
Total Protein: 5 g/dL — ABNORMAL LOW (ref 6.5–8.1)

## 2022-02-02 LAB — MAGNESIUM: Magnesium: 1.8 mg/dL (ref 1.7–2.4)

## 2022-02-02 LAB — GLUCOSE, CAPILLARY
Glucose-Capillary: 107 mg/dL — ABNORMAL HIGH (ref 70–99)
Glucose-Capillary: 116 mg/dL — ABNORMAL HIGH (ref 70–99)
Glucose-Capillary: 145 mg/dL — ABNORMAL HIGH (ref 70–99)
Glucose-Capillary: 137 mg/dL — ABNORMAL HIGH (ref 70–99)

## 2022-02-02 LAB — CBC
HCT: 38.6 % — ABNORMAL LOW (ref 39.0–52.0)
Hemoglobin: 12.1 g/dL — ABNORMAL LOW (ref 13.0–17.0)
MCH: 29.7 pg (ref 26.0–34.0)
MCHC: 31.3 g/dL (ref 30.0–36.0)
MCV: 94.6 fL (ref 80.0–100.0)
Platelets: 195 10*3/uL (ref 150–400)
RBC: 4.08 MIL/uL — ABNORMAL LOW (ref 4.22–5.81)
RDW: 13.3 % (ref 11.5–15.5)
WBC: 9.4 10*3/uL (ref 4.0–10.5)
nRBC: 0 % (ref 0.0–0.2)

## 2022-02-02 MED ORDER — POTASSIUM CHLORIDE CRYS ER 20 MEQ PO TBCR
60.0000 meq | EXTENDED_RELEASE_TABLET | Freq: Once | ORAL | Status: AC
  Administered 2022-02-02: 60 meq via ORAL
  Filled 2022-02-02: qty 3

## 2022-02-02 MED ORDER — ORAL CARE MOUTH RINSE: 15.0000 mL | OROMUCOSAL | Status: DC | PRN

## 2022-02-02 MED ORDER — MAGNESIUM SULFATE 2 GM/50ML IV SOLN
2.0000 g | Freq: Once | INTRAVENOUS | Status: AC
  Administered 2022-02-02: 2 g via INTRAVENOUS
  Filled 2022-02-02: qty 50

## 2022-02-02 MED ORDER — ORAL CARE MOUTH RINSE
15.0000 mL | OROMUCOSAL | Status: DC
  Administered 2022-02-02 – 2022-02-06 (×10): 15 mL via OROMUCOSAL

## 2022-02-02 NOTE — Progress Notes (Signed)
Patient ID: [name], male   DOB: 13-May-1932, 89 y.o.   MRN: [id number]    Progress Note from the Palliative Medicine Team at [facility]   Patient Name: [name] [name]        Date: 02/02/2022 DOB: 1932/09/15  Age: 89 y.o. MRN#: [id number] Attending Physician: Donne Hazel, MD Primary Care Physician: Prince Solian, MD Admit Date: 01/27/2022   Medical records reviewed    89 y.o. male with medical history significant of coronary artery disease, carotid stenosis, diabetes mellitus, hypertension, peptic ulcer disease, right bundle branch block, and more presents the ED with a chief complaint of fall.  Bradycardic in ER.  Family report ongoing physical, functional and mild cognitive decline over the past many months.   Patient transferred from [facility] to [facility] for further evaluation of significant cardiac disease.    Patient and family face treatment option decisions, advanced directive decisions and anticipatory care needs.  This NP assessed patient at the bedside as a follow up for palliative medicine needs and emotional support and to meet as scheduled with family.   Unfortunately [name] [name] is confused and unable to participate in medical decisions for himself at this time.  I met with patient's 4 children Consepcion Hearing, Awanda Mink, Drexel and Wilson.  Continued education regarding current medical situation.  We reviewed patient's electronic medical record specific to head CT, lab values and physical therapy notes.   Explored again patient's past decisions to forego diagnostics and cardiac interventions offered to prolong life.   All family at bedside agree and support [name] [name]'s decisions to focus plan of care on comfort and dignity, hopes for natural death.  We reviewed patient's advance directive documents.  Education offered on hospice benefit; philosophy and eligibility.  MOST form introduced and a copy is left for review.   Plan of Care: -DNR/DNI--ultimately comfort and  dignity are focus of care -no artificial feeding now or in the future -no aggressive diagnostics or interventions for his significant cardiac disease -SNF for short term rehab- recommend OP palliative services at facility    -    Family's first choice for rehab center is [facility] in [place] area.    Of note patient is a life time member of the [employer].   PMT will continue to support holistically  Education offered today regarding  the importance of continued conversation with family and their  medical providers regarding overall plan of care and treatment options,  ensuring decisions are within the context of the patients values and GOCs.  Questions and concerns addressed   Discussed with Dr Wyline Copas and bedside RN   Total time spent on the unit was 75 minutes-greater than 50% of the time was spent in counseling and coordination of care.   Wadie Lessen NP  Palliative Medicine Team Team Phone # [phone] Pager [phone]

## 2022-02-02 NOTE — Progress Notes (Signed)
Progress Note   Patient: [name] [name]:[id number] DOB: 05-21-1932 DOA: 01/27/2022     5 DOS: the patient was seen and examined on 02/02/2022   Brief hospital course:  89 y.o. male with medical history significant of coronary artery disease, carotid stenosis, diabetes mellitus, hypertension, peptic ulcer disease, right bundle branch block, and more presents the ED with a chief complaint of fall.  Patient does not know how he fell.  Daughter at bedside provided most the history.  She reports that he was ambulating with his walker and he said he was moving a box out of the way and fell down.  She did not see the fall, but when she got to him immediately after he had abrasion on the back of his head, and could not get up.  Pt cannot clarify if he had LOC or not.  He was not complaining of chest pain or palpitations according to the daughter.  He had a similar fall a week PTA when the caregiver was there.  He was sore but seemed okay, but started complaining more more of low back pain since that first fall.  On the day of admission, pt had trouble getting up due to pain in his back. For the falls he had incontinence of bowel and bladder which appears to be a chronic issue.   Daughter reports that she has noticed a functional decline in him over the past few months.  She says that he has been more fatigued.  Pt was seen by cardiology who felt the patient had second-degree AV block type I, but could not rule out type II second-degree AV block.  As result, patient is being transferred to [facility] for EP evaluation and for cardiology to follow.    Assessment and Plan: Second-degree AV block -Appreciate cardiology consult -initial concern for heart block -No prolonged pauses or heart block since stopping Toprolol per Cardiology -Noted to have intermittent Mobitz 1 second degree AV block, no indication for pacemaker at this point.   Paroxysmal aflutter -currently rate controlled at present -Cont to avoid  AV nodal agents  -Per Cardiology, not good candidate for anticoagulation   Acute metabolic encephalopathy -Patient has developed some hospital delirium since admission -family had reported cognitive functional decline over last 6 months -B12 580 -TSH 0.978 -UA negative for pyuria -CT brain negative for acute findings -Mentation seems much improved this AM. Conversing more appropriately   Close compression fracture L1 -CT lumbar spine shows L1 fracture with 30-40% height loss -TLSO brace when up with PT -judicious opioids -Therapy following, thus far recs for SNF   Acute on chronic renal failure--CKD stage IIIb -Baseline creatinine 1.4-1.6 -Serum creatinine peaked 2.31 -Holding irbesartan/HCTZ -Cr down to 1.36 with IVF   Right Carotid stenosis -01/27/22 Carotid ultrasound--R ICA with 70-99% stenosis -11/08/2018 US carotid--R-ICA 60-79% stenosis -Appreciate input by Vascular Surgeon. While awake, pt voiced to Vascular Surgeon that he did now wish to move forward with surgery. Vascular recs to continue plavix and statin -Palliative Care on board   Essential hypertension -Continue amlodipine -Holding metoprolol secondary to heart block -Holding irbesartan secondary to acute kidney injury -BP stable   Controlled diabetes mellitus type 2  -01/27/2022 hemoglobin A1c 5.5 -NovoLog sliding scale -Continue reduced dose Levemir -Glycemic trends stable   Coronary artery disease -No chest pain presently -Continue Plavix -Continue Crestor   Tobacco abuse -Tobacco cessation discussed -nicoderm patch   COPD -Stable on room air   Mixed hyperlipidemia -Continue statin per above  Cervical spine stenosis -01/27/2022 CT cervical spine--multilevel DJD; 7 mm AP thecal sac stenosis C1-2>>appears unchanged from prior MRI C-spine 07/19/17 -Patient denies any neck pain -no extremity weakness  Hypokalemia -cont to correct as needed -Recheck bmet in AM  Hypomagnesemia -Will replace       Subjective: Without complaints this AM. Mildly confused this AM  Physical Exam: Vitals:   02/02/22 0737 02/02/22 0738 02/02/22 1052 02/02/22 1644  BP:  (!) 158/73 (!) 164/64 130/63  Pulse: 62 60 65   Resp:  20  20  Temp:  98.6 F (37 C)  99 F (37.2 C)  TempSrc:  Oral  Axillary  SpO2: 97% 97% 96%   Weight:      Height:       General exam: Conversant, in no acute distress Respiratory system: normal chest rise, clear, no audible wheezing Cardiovascular system: regular rhythm, s1-s2 Gastrointestinal system: Nondistended, nontender, pos BS Central nervous system: No seizures, no tremors Extremities: No cyanosis, no joint deformities Skin: No rashes, no pallor Psychiatry: Affect normal // no auditory hallucinations   Data Reviewed:  Labs reviewed: Na 141, K 3.4, Cr 1.36, Mg 1.8, hgb 12.1   Family Communication: Pt in room, family is not at bedside  Disposition: Status is: Inpatient Remains inpatient appropriate because: Severity of illness  Planned Discharge Destination:  Unclear at this time    Author: Marylu Lund, MD 02/02/2022 5:02 PM  For on call review [email or web address].

## 2022-02-02 NOTE — Progress Notes (Signed)
Rounding Note    Patient Name: [name] [name] Date of Encounter: 02/02/2022  [facility] Cardiologist: Minus Breeding, MD   Subjective   No dyspnea or CP  Inpatient Medications    Scheduled Meds:  amLODipine  10 mg Oral Daily   clopidogrel  75 mg Oral Daily   escitalopram  10 mg Oral Daily   Gerhardt's butt cream   Topical TID   heparin  5,000 Units Subcutaneous Q8H   insulin aspart  0-5 Units Subcutaneous QHS   insulin aspart  0-9 Units Subcutaneous TID WC   insulin detemir  7 Units Subcutaneous QHS   lidocaine  1 patch Transdermal Q24H   nicotine  21 mg Transdermal Daily   mouth rinse  15 mL Mouth Rinse 4 times per day   pantoprazole  40 mg Oral Daily   rosuvastatin  5 mg Oral Daily   Continuous Infusions:  lactated ringers 75 mL/hr at 02/02/22 0235   PRN Meds: acetaminophen **OR** acetaminophen, gabapentin, haloperidol lactate, ondansetron **OR** ondansetron (ZOFRAN) IV, mouth rinse, oxyCODONE   Vital Signs    Vitals:   02/02/22 0021 02/02/22 0611 02/02/22 0737 02/02/22 0738  BP: 132/72 (!) 150/63  (!) 158/73  Pulse: 67 64 62 60  Resp: '20 20  20  '$ Temp: 97.9 F (36.6 C) 98.8 F (37.1 C)  98.6 F (37 C)  TempSrc: Oral Oral  Oral  SpO2: 99% 99% 97% 97%  Weight:  77 kg    Height:        Intake/Output Summary (Last 24 hours) at 02/02/2022 0804 Last data filed at 02/02/2022 0641 Gross per 24 hour  Intake 597 ml  Output 625 ml  Net -28 ml       02/02/2022    6:11 AM 01/27/2022    6:25 PM 05/13/2018    9:00 AM  Last 3 Weights  Weight (lbs) 169 lb 12.1 oz 165 lb 210 lb  Weight (kg) 77 kg 74.844 kg 95.255 kg      Telemetry    Atrial flutter rate controlled- Personally Reviewed    Physical Exam   GEN: NAD  Neck: Supple Cardiac: Irregular, no rub Respiratory: CTA anteriorly  GI: Soft, NT MS: No edema Neuro:  Grossly intact Psych: Normal affect   Labs     Chemistry Recent Labs  Lab 01/28/22 0503 01/29/22 0726 01/31/22 0316  02/01/22 0122 02/02/22 0142  NA 135   < > 145 147* 141  K 4.0   < > 2.8* 4.1 3.4*  CL 106   < > 114* 118* 111  CO2 20*   < > 20* 19* 21*  GLUCOSE 106*   < > 89 99 135*  BUN 62*   < > 37* 33* 30*  CREATININE 2.16*   < > 1.53* 1.46* 1.36*  CALCIUM 8.6*   < > 8.7* 8.8* 8.6*  MG  --    < > 1.8 1.7 1.8  PROT 6.1*  --  5.3*  --  5.0*  ALBUMIN 3.1*  --  2.5*  --  2.3*  AST 14*  --  16  --  14*  ALT 10  --  11  --  12  ALKPHOS 50  --  53  --  59  BILITOT 0.8  --  0.8  --  0.5  GFRNONAA 29*   < > 44* 46* 50*  ANIONGAP 9   < > '11 10 9   '$ < > = values in this interval  not displayed.      Hematology Recent Labs  Lab 01/31/22 0316 02/01/22 0122 02/02/22 0142  WBC 7.7 9.3 9.4  RBC 3.98* 4.06* 4.08*  HGB 12.3* 12.5* 12.1*  HCT 36.7* 38.0* 38.6*  MCV 92.2 93.6 94.6  MCH 30.9 30.8 29.7  MCHC 33.5 32.9 31.3  RDW 13.3 13.4 13.3  PLT 182 205 195    Thyroid  Recent Labs  Lab 01/28/22 0925  TSH 0.978      Patient Profile     89 y.o. male with past medical history of coronary artery disease, cerebrovascular disease, hypertension, hyperlipidemia, bradycardia for evaluation of bradycardia and fall.  Echocardiogram shows normal LV function, mild left ventricular hypertrophy, mild to moderate left atrial enlargement and mildly dilated aortic root.  Also noted to have transient atrial fibrillation/flutter.  Assessment & Plan    1 status post fall-as outlined previously it is unclear if patient had syncope.  He has significant conduction disease on his ECG and was noted to have intermittent Mobitz 1 second-degree AV block.  He has not had prolonged pauses.  Toprol has been discontinued.  No indication for pacemaker at this point.  Can consider outpatient monitor at discharge.  Note LV function is normal.     2 paroxysmal atrial flutter-patient remains in atrial flutter this morning.  However he is asymptomatic and his heart rate is controlled.  We will continue to avoid AV nodal blocking  agents as outlined above.  Given history of recurrent falls I do not think he is a good candidate for long-term anticoagulation.     3 coronary artery disease-continue Plavix and statin.     4 carotid artery disease-per vascular surgery.   5 hypertension-continue amlodipine.    6 acute on chronic stage IIIb kidney disease-continue to follow renal function.   7 lung nodule/dilated aortic root-follow-up as an outpatient.    For questions or updates, please contact [place] Please consult [email or web address] for contact info under        Signed, Kirk Ruths, MD  02/02/2022, 8:04 AM

## 2022-02-03 DIAGNOSIS — R55 Syncope and collapse: Secondary | ICD-10-CM | POA: Diagnosis not present

## 2022-02-03 DIAGNOSIS — S32010A Wedge compression fracture of first lumbar vertebra, initial encounter for closed fracture: Secondary | ICD-10-CM | POA: Diagnosis not present

## 2022-02-03 DIAGNOSIS — I441 Atrioventricular block, second degree: Secondary | ICD-10-CM | POA: Diagnosis not present

## 2022-02-03 DIAGNOSIS — I459 Conduction disorder, unspecified: Secondary | ICD-10-CM | POA: Diagnosis not present

## 2022-02-03 DIAGNOSIS — I4892 Unspecified atrial flutter: Secondary | ICD-10-CM | POA: Diagnosis not present

## 2022-02-03 LAB — COMPREHENSIVE METABOLIC PANEL
ALT: 13 U/L (ref 0–44)
AST: 15 U/L (ref 15–41)
Albumin: 2.4 g/dL — ABNORMAL LOW (ref 3.5–5.0)
Alkaline Phosphatase: 61 U/L (ref 38–126)
Anion gap: 12 (ref 5–15)
BUN: 21 mg/dL (ref 8–23)
CO2: 23 mmol/L (ref 22–32)
Calcium: 8.8 mg/dL — ABNORMAL LOW (ref 8.9–10.3)
Chloride: 107 mmol/L (ref 98–111)
Creatinine, Ser: 1.31 mg/dL — ABNORMAL HIGH (ref 0.61–1.24)
GFR, Estimated: 53 mL/min — ABNORMAL LOW (ref >60)
Glucose, Bld: 91 mg/dL (ref 70–99)
Potassium: 3.5 mmol/L (ref 3.5–5.1)
Sodium: 142 mmol/L (ref 135–145)
Total Bilirubin: 0.6 mg/dL (ref 0.3–1.2)
Total Protein: 5.4 g/dL — ABNORMAL LOW (ref 6.5–8.1)

## 2022-02-03 LAB — MAGNESIUM: Magnesium: 1.9 mg/dL (ref 1.7–2.4)

## 2022-02-03 LAB — GLUCOSE, CAPILLARY
Glucose-Capillary: 99 mg/dL (ref 70–99)
Glucose-Capillary: 113 mg/dL — ABNORMAL HIGH (ref 70–99)
Glucose-Capillary: 161 mg/dL — ABNORMAL HIGH (ref 70–99)
Glucose-Capillary: 137 mg/dL — ABNORMAL HIGH (ref 70–99)

## 2022-02-03 NOTE — Care Management Important Message (Signed)
Important Message  Patient Details  Name: [name] [name] MRN: [id number] Date of Birth: [birth year]   Medicare Important Message Given:  Yes     [name] 02/03/2022, 10:23 AM

## 2022-02-03 NOTE — NC FL2 (Signed)
[place] LEVEL OF CARE FORM     IDENTIFICATION  Patient Name: [name] [name] Birthdate: [birth year] Sex: male Admission Date (Current Location): 01/27/2022  [facility] and [place] Number:  Herbalist and Address:  [facility], [place] [address], [place], [place] [address]      Provider Number: [id number]  Attending Physician Name and Address:  Donne Hazel, MD  Relative Name and Phone Number:  Dory Horn (daughter) [phone]    Current Level of Care: Hospital Recommended Level of Care: [place] Prior Approval Number:    Date Approved/Denied:   PASRR Number: [id number]  Discharge Plan: SNF    Current Diagnoses: Patient Active Problem List   Diagnosis Date Noted   Acute metabolic encephalopathy 56/31/4970   Heart block AV second degree 01/29/2022   Heart block 01/28/2022   Carotid stenosis 01/28/2022   Closed compression fracture of L1 vertebra ([place]) 01/28/2022   GERD (gastroesophageal reflux disease) 01/28/2022   Bilateral carotid artery stenosis 05/07/2018   Preop cardiovascular exam 05/07/2018   Educated about COVID-19 virus infection 05/07/2018   Coronary artery disease involving native coronary artery of native heart without angina pectoris 05/07/2018   Tobacco abuse 01/03/2011   Pneumonia 01/02/2011   Anemia 01/02/2011   Acute renal failure superimposed on stage 3b chronic kidney disease ([place]) 01/02/2011   Elevated troponin 01/02/2011   Acute respiratory distress 01/02/2011   COPD (chronic obstructive pulmonary disease) ([place]) 01/02/2011   Carotid bruit 05/02/2010   DYSLIPIDEMIA 03/16/2009   OBESITY, UNSPECIFIED 03/16/2009   TOBACCO ABUSE 03/16/2009   Diabetes mellitus type 2 in nonobese ([place]) 03/14/2009   Essential hypertension 03/14/2009   Coronary atherosclerosis 03/14/2009   SLEEP APNEA 03/14/2009    Orientation RESPIRATION BLADDER Height & Weight        Normal Incontinent, External  catheter Weight: 169 lb 12.1 oz (77 kg) Height:  '5\' 3"'$  (160 cm)  BEHAVIORAL SYMPTOMS/MOOD NEUROLOGICAL BOWEL NUTRITION STATUS      Incontinent Diet (see dc summary)  AMBULATORY STATUS COMMUNICATION OF NEEDS Skin   Extensive Assist Verbally Normal                       Personal Care Assistance Level of Assistance  Bathing, Feeding, Dressing Bathing Assistance: Maximum assistance Feeding assistance: Limited assistance Dressing Assistance: Maximum assistance     Functional Limitations Info  Sight, Hearing, Speech Sight Info: Impaired Hearing Info: Impaired Speech Info: Adequate    SPECIAL CARE FACTORS FREQUENCY  PT (By licensed PT), OT (By licensed OT)     PT Frequency: 5x week OT Frequency: 5x week            Contractures Contractures Info: Not present    Additional Factors Info  Code Status, Allergies, Psychotropic, Insulin Sliding Scale Code Status Info: DNR Allergies Info: NKA Psychotropic Info: escitalopram (LEXAPRO) tablet 10 mg daily Insulin Sliding Scale Info: insulin aspart (novoLOG) injection 0-5 Units daily at bedtime,  insulin aspart (novoLOG) injection 0-9 Units 3 times daily with meals,insulin detemir (LEVEMIR) injection 7 Units daily at bedtime       Current Medications (02/03/2022):  This is the current hospital active medication list Current Facility-Administered Medications  Medication Dose Route Frequency Provider Last Rate Last Admin   acetaminophen (TYLENOL) tablet 650 mg  650 mg Oral Q6H PRN Zierle-Ghosh, Asia B, DO       Or   acetaminophen (TYLENOL) suppository 650 mg  650 mg Rectal Q6H  PRN Zierle-Ghosh, [place] B, DO       amLODipine (NORVASC) tablet 10 mg  10 mg Oral Daily Lelon Perla, MD   10 mg at 02/03/22 1100   clopidogrel (PLAVIX) tablet 75 mg  75 mg Oral Daily Zierle-Ghosh, Asia B, DO   75 mg at 02/03/22 1100   escitalopram (LEXAPRO) tablet 10 mg  10 mg Oral Daily Zierle-Ghosh, Asia B, DO   10 mg at 02/03/22 1100   gabapentin  (NEURONTIN) capsule 100 mg  100 mg Oral QHS PRN Zierle-Ghosh, Asia B, DO       Gerhardt's butt cream   Topical TID Donne Hazel, MD   Given at 02/02/22 2254   haloperidol lactate (HALDOL) injection 5 mg  5 mg Intravenous Q6H PRN Howerter, Justin B, DO   5 mg at 01/30/22 0904   heparin injection 5,000 Units  5,000 Units Subcutaneous Q8H Zierle-Ghosh, Asia B, DO   5,000 Units at 02/03/22 0645   insulin aspart (novoLOG) injection 0-5 Units  0-5 Units Subcutaneous QHS Tat, Shanon Brow, MD       insulin aspart (novoLOG) injection 0-9 Units  0-9 Units Subcutaneous TID WC Tat, Shanon Brow, MD   1 Units at 02/02/22 1655   insulin detemir (LEVEMIR) injection 7 Units  7 Units Subcutaneous QHS Zierle-Ghosh, Asia B, DO   7 Units at 02/02/22 2251   lactated ringers infusion   Intravenous Continuous Donne Hazel, MD 75 mL/hr at 02/02/22 0235 New Bag at 02/02/22 0235   lidocaine (LIDODERM) 5 % 1 patch  1 patch Transdermal Q24H Zierle-Ghosh, Asia B, DO   1 patch at 02/02/22 2252   nicotine (NICODERM CQ - dosed in mg/24 hours) patch 21 mg  21 mg Transdermal Daily Tat, Shanon Brow, MD   21 mg at 02/02/22 1050   ondansetron (ZOFRAN) tablet 4 mg  4 mg Oral Q6H PRN Zierle-Ghosh, Asia B, DO       Or   ondansetron (ZOFRAN) injection 4 mg  4 mg Intravenous Q6H PRN Zierle-Ghosh, Asia B, DO       Oral care mouth rinse  15 mL Mouth Rinse 4 times per day Donne Hazel, MD   15 mL at 02/02/22 2257   Oral care mouth rinse  15 mL Mouth Rinse PRN Donne Hazel, MD       oxyCODONE (Oxy IR/ROXICODONE) immediate release tablet 5 mg  5 mg Oral Q4H PRN Zierle-Ghosh, Asia B, DO       pantoprazole (PROTONIX) EC tablet 40 mg  40 mg Oral Daily Zierle-Ghosh, Asia B, DO   40 mg at 02/03/22 1100   rosuvastatin (CRESTOR) tablet 5 mg  5 mg Oral Daily Zierle-Ghosh, Asia B, DO   5 mg at 02/03/22 1100   Facility-Administered Medications Ordered in Other Encounters  Medication Dose Route Frequency Provider Last Rate Last Admin   gemcitabine (GEMZAR)  chemo syringe for bladder instillation 2,000 mg  2,000 mg Bladder Instillation Once Franchot Gallo, MD         Discharge Medications: Please see discharge summary for a list of discharge medications.  Relevant Imaging Results:  Relevant Lab Results:   Additional Information SSN [id number]  Milas Gain, LCSWA

## 2022-02-03 NOTE — Progress Notes (Signed)
Progress Note   Patient: [name] [name] WGY:[id number] DOB: 03/27/1932 DOA: 01/27/2022     6 DOS: the patient was seen and examined on 02/03/2022   Brief hospital course:  89 y.o. male with medical history significant of coronary artery disease, carotid stenosis, diabetes mellitus, hypertension, peptic ulcer disease, right bundle branch block, and more presents the ED with a chief complaint of fall.  Patient does not know how he fell.  Daughter at bedside provided most the history.  She reports that he was ambulating with his walker and he said he was moving a box out of the way and fell down.  She did not see the fall, but when she got to him immediately after he had abrasion on the back of his head, and could not get up.  Pt cannot clarify if he had LOC or not.  He was not complaining of chest pain or palpitations according to the daughter.  He had a similar fall a week PTA when the caregiver was there.  He was sore but seemed okay, but started complaining more more of low back pain since that first fall.  On the day of admission, pt had trouble getting up due to pain in his back. For the falls he had incontinence of bowel and bladder which appears to be a chronic issue.   Daughter reports that she has noticed a functional decline in him over the past few months.  She says that he has been more fatigued.  Pt was seen by cardiology who felt the patient had second-degree AV block type I, but could not rule out type II second-degree AV block.  As result, patient is being transferred to [facility] for EP evaluation and for cardiology to follow.    Assessment and Plan: Second-degree AV block -Appreciate cardiology consult -initial concern for heart block -No prolonged pauses or heart block since stopping Toprolol per Cardiology -Noted to have intermittent Mobitz 1 second degree AV block, no indication for pacemaker at this point.  -Cardiology has since signed off as of 1/29  Paroxysmal  aflutter -currently rate controlled at present -Cont to avoid AV nodal agents  -Per Cardiology, not good candidate for anticoagulation -Cardiology has since signed off   Acute metabolic encephalopathy -Patient has developed some hospital delirium since admission -family had reported cognitive functional decline over last 6 months -B12 580 -TSH 0.978 -UA negative for pyuria -CT brain negative for acute findings -Mentation seems improved since transfer to [facility] and since receiving IVF. Still seems somewhat confused this AM   Close compression fracture L1 -CT lumbar spine shows L1 fracture with 30-40% height loss -TLSO brace when up with PT -judicious opioids -Therapy following, thus far recs for SNF   Acute on chronic renal failure--CKD stage IIIb -Baseline creatinine 1.4-1.6 -Serum creatinine peaked 2.31 -Holding irbesartan/HCTZ -Cr down to 1.31 with IVF -recheck bmet in AM   Right Carotid stenosis -01/27/22 Carotid ultrasound--R ICA with 70-99% stenosis -11/08/2018 US carotid--R-ICA 60-79% stenosis -Appreciate input by Vascular Surgeon. While awake, pt voiced to Vascular Surgeon that he did now wish to move forward with surgery. Vascular recs to continue plavix and statin -Palliative Care was consulted   Essential hypertension -Continue amlodipine -Holding metoprolol secondary to heart block -Holding irbesartan secondary to acute kidney injury -BP remains stable   Controlled diabetes mellitus type 2  -01/27/2022 hemoglobin A1c 5.5 -NovoLog sliding scale -Continue reduced dose Levemir -Glycemic trends stable   Coronary artery disease -Continue Plavix -Continue Crestor  Tobacco abuse -Tobacco cessation discussed -nicoderm patch   COPD -Stable on room air   Mixed hyperlipidemia -Continue statin per above   Cervical spine stenosis -01/27/2022 CT cervical spine--multilevel DJD; 7 mm AP thecal sac stenosis C1-2>>appears unchanged from prior MRI C-spine  07/19/17 -Patient denies any neck pain -no extremity weakness  Hypokalemia -cont to correct as needed -Recheck bmet in AM  Hypomagnesemia -Replaced      Subjective: no complaints this AM. Being fed breakfast by NT when seen  Physical Exam: Vitals:   02/02/22 2032 02/03/22 0442 02/03/22 0825 02/03/22 1531  BP: (!) 158/60 (!) 152/80 (!) 152/94 (!) 143/70  Pulse: 65 68 70 70  Resp: '20 18 18 18  '$ Temp: 98.7 F (37.1 C) 98.4 F (36.9 C) 98.8 F (37.1 C) 98.9 F (37.2 C)  TempSrc: Oral Oral Oral Oral  SpO2: 98% 99% 93% 96%  Weight:      Height:       General exam: Awake, laying in bed, in nad Respiratory system: Normal respiratory effort, no wheezing Cardiovascular system: regular rate, s1, s2 Gastrointestinal system: Soft, nondistended, positive BS Central nervous system: CN2-12 grossly intact, strength intact Extremities: Perfused, no clubbing Skin: Normal skin turgor, no notable skin lesions seen Psychiatry: Mood normal // no visual hallucinations   Data Reviewed:  Labs reviewed: Na 142, K 3.5, Cr 1.31, Mg 1.9  Family Communication: Pt in room, family is not at bedside  Disposition: Status is: Inpatient Remains inpatient appropriate because: Severity of illness  Planned Discharge Destination: Skilled nursing facility    Author: Marylu Lund, MD 02/03/2022 5:37 PM  For on call review [email or web address].

## 2022-02-03 NOTE — Progress Notes (Signed)
Rounding Note    Patient Name: [name] [name] Date of Encounter: 02/03/2022  [place] Cardiologist: Minus Breeding, MD   Subjective   Pt seen working with therapy - confused. No complaints  Inpatient Medications    Scheduled Meds:  amLODipine  10 mg Oral Daily   clopidogrel  75 mg Oral Daily   escitalopram  10 mg Oral Daily   Gerhardt's butt cream   Topical TID   heparin  5,000 Units Subcutaneous Q8H   insulin aspart  0-5 Units Subcutaneous QHS   insulin aspart  0-9 Units Subcutaneous TID WC   insulin detemir  7 Units Subcutaneous QHS   lidocaine  1 patch Transdermal Q24H   nicotine  21 mg Transdermal Daily   mouth rinse  15 mL Mouth Rinse 4 times per day   pantoprazole  40 mg Oral Daily   rosuvastatin  5 mg Oral Daily   Continuous Infusions:  lactated ringers 75 mL/hr at 02/02/22 0235   PRN Meds: acetaminophen **OR** acetaminophen, gabapentin, haloperidol lactate, ondansetron **OR** ondansetron (ZOFRAN) IV, mouth rinse, oxyCODONE   Vital Signs    Vitals:   02/02/22 1052 02/02/22 1644 02/02/22 2032 02/03/22 0442  BP: (!) 164/64 130/63 (!) 158/60 (!) 152/80  Pulse: 65  65 68  Resp:  '20 20 18  '$ Temp:  99 F (37.2 C) 98.7 F (37.1 C) 98.4 F (36.9 C)  TempSrc:  Axillary Oral Oral  SpO2: 96%  98% 99%  Weight:      Height:        Intake/Output Summary (Last 24 hours) at 02/03/2022 0720 Last data filed at 02/03/2022 0445 Gross per 24 hour  Intake 1426.7 ml  Output 700 ml  Net 726.7 ml      02/02/2022    6:11 AM 01/27/2022    6:25 PM 05/13/2018    9:00 AM  Last 3 Weights  Weight (lbs) 169 lb 12.1 oz 165 lb 210 lb  Weight (kg) 77 kg 74.844 kg 95.255 kg      Telemetry    Atrial flutter in the 60s - Personally Reviewed  ECG    No new tracings - Personally Reviewed  Physical Exam   GEN: No acute distress.   Respiratory: respirations unlabored MS: No edema Neuro:  confused this morning Psych: Normal affect   Labs    High Sensitivity  Troponin:  No results for input(s): "TROPONINIHS" in the last 720 hours.   Chemistry Recent Labs  Lab 01/28/22 0503 01/29/22 0726 01/31/22 0316 02/01/22 0122 02/02/22 0142  NA 135   < > 145 147* 141  K 4.0   < > 2.8* 4.1 3.4*  CL 106   < > 114* 118* 111  CO2 20*   < > 20* 19* 21*  GLUCOSE 106*   < > 89 99 135*  BUN 62*   < > 37* 33* 30*  CREATININE 2.16*   < > 1.53* 1.46* 1.36*  CALCIUM 8.6*   < > 8.7* 8.8* 8.6*  MG  --    < > 1.8 1.7 1.8  PROT 6.1*  --  5.3*  --  5.0*  ALBUMIN 3.1*  --  2.5*  --  2.3*  AST 14*  --  16  --  14*  ALT 10  --  11  --  12  ALKPHOS 50  --  53  --  59  BILITOT 0.8  --  0.8  --  0.5  GFRNONAA 29*   < >  44* 46* 50*  ANIONGAP 9   < > '11 10 9   '$ < > = values in this interval not displayed.    Lipids No results for input(s): "CHOL", "TRIG", "HDL", "LABVLDL", "LDLCALC", "CHOLHDL" in the last 168 hours.  Hematology Recent Labs  Lab 01/31/22 0316 02/01/22 0122 02/02/22 0142  WBC 7.7 9.3 9.4  RBC 3.98* 4.06* 4.08*  HGB 12.3* 12.5* 12.1*  HCT 36.7* 38.0* 38.6*  MCV 92.2 93.6 94.6  MCH 30.9 30.8 29.7  MCHC 33.5 32.9 31.3  RDW 13.3 13.4 13.3  PLT 182 205 195   Thyroid  Recent Labs  Lab 01/28/22 0925  TSH 0.978    BNPNo results for input(s): "BNP", "PROBNP" in the last 168 hours.  DDimer No results for input(s): "DDIMER" in the last 168 hours.   Radiology    No results found.  Cardiac Studies   Echo 01/28/22  1. Left ventricular ejection fraction, by estimation, is 65 to 70%. The  left ventricle has normal function. The left ventricle has no regional  wall motion abnormalities. There is mild concentric left ventricular  hypertrophy. Left ventricular diastolic  parameters are indeterminate.   2. Right ventricular systolic function is normal. The right ventricular  size is normal. Tricuspid regurgitation signal is inadequate for assessing  PA pressure.   3. Left atrial size was mild to moderately dilated.   4. The mitral valve is  grossly normal, mildly calcified. Trivial mitral  valve regurgitation.   5. The aortic valve is tricuspid. There is mild calcification of the  aortic valve. Aortic valve regurgitation is not visualized. Aortic valve  sclerosis is present, with no evidence of aortic valve stenosis. Aortic  valve mean gradient measures 3.0 mmHg.   6. Aortic dilatation noted. There is mild dilatation of the aortic root,  measuring 42 mm. There is mild dilatation of the ascending aorta,  measuring 39 mm.   7. Unable to estimate CVP.   Patient Profile     89 y.o. male with past medical history of coronary artery disease, cerebrovascular disease, hypertension, hyperlipidemia, bradycardia for evaluation of bradycardia and fall.  Echocardiogram shows normal LV function, mild left ventricular hypertrophy, mild to moderate left atrial enlargement and mildly dilated aortic root.  Also noted to have transient atrial fibrillation/flutter.   Assessment & Plan    Paroxysmal atrial flutter Intermittent mobitz second degree AV block - BB has been discontinued - asymptomatic and rate controlled without AV nodal agents - given history of recurrent falls, felt not a good candidate for oral anticoagulation   Fall Question of syncope -unclear LOC Do not see long pauses on telemetry   CAD Continue plavix and statin No chest pain - but confused this morning   Hypertension On amlodipine Holding BB Irbesartan has not been restarted due to renal function   Carotid artery disease Last study Jan 2024 with 70-99% stenosis on right Treat medically   Acute metabolic encephalopathy Hospital delirium with reported functional decline over the last 6 months UA  negative, CT head negative for acute findings   Per notes, patient has spoken with palliative care this admission. Disposition - discharging to SNF     For questions or updates, please contact [place] Please consult [email or web address] for contact  info under        Signed, Ledora Bottcher, PA  02/03/2022, 7:20 AM

## 2022-02-03 NOTE — TOC Initial Note (Signed)
Transition of Care [facility]) - Initial/Assessment Note    Patient Details  Name: [name] [name] MRN: [id number] Date of Birth: 22-Dec-1932  Transition of Care [facility]) CM/SW Contact:    Milas Gain, [place] Phone Number: 02/03/2022, 12:25 PM  Clinical Narrative:                  CSW received consult for possible SNF placement at time of discharge. Due to patients current orientation CSW spoke with patients daughter [name] regarding PT recommendation of SNF placement at time of discharge. Patients daughter reports PTA patient comes from home with spouse. Patients daughter expressed understanding of PT recommendation and is agreeable to SNF placement for patient at time of discharge. Patients daughter reports preference for [facility] as number one choice . CSW discussed insurance authorization process.  CSW received consult to make palliative referral for palliative services to follow patient at SNF. Patients daughter [name] gave CSW permission to make referral to [facility] for palliative services to follow patient at SNF. CSW made referral to [facility] with [facility] for palliative services to follow patient at SNF. No further questions reported at this time. CSW to continue to follow and assist with discharge planning needs.    Expected Discharge Plan: Skilled Nursing Facility Barriers to Discharge: Continued Medical Work up   Patient Goals and CMS Choice     Choice offered to / list presented to : Adult Children (Patients daughter [name])      Expected Discharge Plan and Services In-house Referral: Clinical Social Work     Living arrangements for the past 2 months: Single Family Home                                      Prior Living Arrangements/Services Living arrangements for the past 2 months: Single Family Home Lives with:: Spouse Patient language and need for interpreter reviewed:: Yes Do you feel safe going back to the place where you live?: No   SNF  Need for  Family Participation in Patient Care: Yes (Comment) Care giver support system in place?: Yes (comment)   Criminal Activity/Legal Involvement Pertinent to Current Situation/Hospitalization: No - Comment as needed  Activities of Daily Living      Permission Sought/Granted Permission sought to share information with : Case Manager, Family Supports, Customer service manager Permission granted to share information with : No  Share Information with NAME: Due to current orientation CSW spoke with patients daughter [name]  Permission granted to share info w AGENCY: Due to current orientation CSW spoke with patients daughter Donnell/SNF  Permission granted to share info w Relationship: Due to current orientation CSW spoke with patients daughter [name]  Permission granted to share info w Contact Information: Due to current orientation CSW spoke with patients daughter [name] [phone]  Emotional Assessment       Orientation: : Fluctuating Orientation (Suspected and/or reported Sundowners) Alcohol / Substance Use: Not Applicable Psych Involvement: No (comment)  Admission diagnosis:  Syncope and collapse [R55] Heart block [I45.9] Heart block AV second degree [I44.1] Closed compression fracture of body of L1 vertebra ([place]) [S32.010A] Patient Active Problem List   Diagnosis Date Noted   Acute metabolic encephalopathy 71/24/5809   Heart block AV second degree 01/29/2022   Heart block 01/28/2022   Carotid stenosis 01/28/2022   Closed compression fracture of L1 vertebra ([place]) 01/28/2022   GERD (gastroesophageal reflux disease) 01/28/2022   Bilateral carotid artery stenosis  05/07/2018   Preop cardiovascular exam 05/07/2018   Educated about COVID-19 virus infection 05/07/2018   Coronary artery disease involving native coronary artery of native heart without angina pectoris 05/07/2018   Tobacco abuse 01/03/2011   Pneumonia 01/02/2011   Anemia 01/02/2011   Acute renal failure  superimposed on stage 3b chronic kidney disease ([place]) 01/02/2011   Elevated troponin 01/02/2011   Acute respiratory distress 01/02/2011   COPD (chronic obstructive pulmonary disease) ([place]) 01/02/2011   Carotid bruit 05/02/2010   DYSLIPIDEMIA 03/16/2009   OBESITY, UNSPECIFIED 03/16/2009   TOBACCO ABUSE 03/16/2009   Diabetes mellitus type 2 in nonobese ([place]) 03/14/2009   Essential hypertension 03/14/2009   Coronary atherosclerosis 03/14/2009   SLEEP APNEA 03/14/2009   PCP:  Prince Solian, MD Pharmacy:   [facility] [place], [place] [address] [place] [address] Phone: [phone] Fax: [phone]     Social Determinants of Health (SDOH) Social History: SDOH Screenings   Tobacco Use: High Risk (01/28/2022)   SDOH Interventions:     Readmission Risk Interventions     No data to display

## 2022-02-03 NOTE — Evaluation (Signed)
Occupational Therapy Evaluation Patient Details Name: [name] [name] MRN: [id number] DOB: 1932/08/14 Today's Date: 02/03/2022   History of Present Illness 89 y.o. male ademitted 1/22 after a fall at home. Found to have Second-degree AV block, Acute metabolic encephalopathy, Closed compression fracture L1,and Acute on chronic renal failure. PMHx:  CAD, HTN, CKD3b, lung nodule, hypokalemia, R BBB.   Clinical Impression   Pt questionable historian, reports living with son and using cane/RW at baseline for mobility and is ind with ADLs. Pt HOH/ decreased cognition needing increased cues to perform bed mobility with use of log roll technique. Pt currently needing min-total A for ADLs, mod A for bed mobility,and mod A +2 for standing from EOB, pt with difficulty stepping LLE, able to take 1-2 side steps before sitting. Pt presenting with impairments listed below, will follow acutely. Recommend SNF at d/c.      Recommendations for follow up therapy are one component of a multi-disciplinary discharge planning process, led by the attending physician.  Recommendations may be updated based on patient status, additional functional criteria and insurance authorization.   Follow Up Recommendations  Skilled nursing-short term rehab (<3 hours/day)     Assistance Recommended at Discharge Frequent or constant Supervision/Assistance  Patient can return home with the following A lot of help with walking and/or transfers;A lot of help with bathing/dressing/bathroom;Assistance with cooking/housework;Direct supervision/assist for medications management;Direct supervision/assist for financial management;Help with stairs or ramp for entrance;Assist for transportation    Functional Status Assessment  Patient has had a recent decline in their functional status and demonstrates the ability to make significant improvements in function in a reasonable and predictable amount of time.  Equipment Recommendations  Other  (comment) (defer)    Recommendations for Other Services PT consult     Precautions / Restrictions Precautions Precautions: Back;Fall Precaution Comments: reports multiple falls at home Required Braces or Orthoses: Spinal Brace Spinal Brace: Thoracolumbosacral orthotic Restrictions Weight Bearing Restrictions: No      Mobility Bed Mobility Overal bed mobility: Needs Assistance Bed Mobility: Rolling, Sidelying to Sit, Sit to Sidelying Rolling: Mod assist Sidelying to sit: Mod assist     Sit to sidelying: Mod assist General bed mobility comments: mod cues/assist due to confusion    Transfers Overall transfer level: Needs assistance Equipment used: Rolling walker (2 wheels) Transfers: Sit to/from Stand, Bed to chair/wheelchair/BSC Sit to Stand: Mod assist, +2 physical assistance           General transfer comment: able to take a few steps toward [facility] with RLE, unable to sequence stepping with LLE      Balance Overall balance assessment: Needs assistance Sitting-balance support: No upper extremity supported, Feet supported Sitting balance-Leahy Scale: Fair     Standing balance support: Bilateral upper extremity supported, Reliant on assistive device for balance Standing balance-Leahy Scale: Poor                             ADL either performed or assessed with clinical judgement   ADL Overall ADL's : Needs assistance/impaired Eating/Feeding: Minimal assistance   Grooming: Minimal assistance;Sitting;Moderate assistance   Upper Body Bathing: Moderate assistance;Sitting   Lower Body Bathing: Maximal assistance   Upper Body Dressing : Moderate assistance Upper Body Dressing Details (indicate cue type and reason): donning TLSO brace Lower Body Dressing: Maximal assistance   Toilet Transfer: Moderate assistance;+2 for physical assistance   Toileting- Clothing Manipulation and Hygiene: Total assistance Toileting - Clothing Manipulation Details  (  indicate cue type and reason): flexiseal     Functional mobility during ADLs: Moderate assistance;+2 for physical assistance       Vision   Vision Assessment?: No apparent visual deficits     Perception Perception Perception Tested?: No   Praxis Praxis Praxis tested?: Not tested    Pertinent Vitals/Pain Pain Assessment Pain Assessment: Faces Pain Score: 3  Pain Location: generalized (?) pt unable to specify Pain Descriptors / Indicators: Discomfort Pain Intervention(s): Limited activity within patient's tolerance, Monitored during session, Repositioned     Hand Dominance Right   Extremity/Trunk Assessment Upper Extremity Assessment Upper Extremity Assessment: Generalized weakness (utilizes compensatory strategies to flex L shoulder, weak grasp, from prior injury per pt)   Lower Extremity Assessment Lower Extremity Assessment: Defer to PT evaluation   Cervical / Trunk Assessment Cervical / Trunk Assessment: Other exceptions (L1 fx)   Communication Communication Communication: HOH   Cognition Arousal/Alertness: Awake/alert Behavior During Therapy: Restless Overall Cognitive Status: Within Functional Limits for tasks assessed Area of Impairment: Attention, Memory, Following commands, Safety/judgement, Awareness, Problem solving                   Current Attention Level: Focused Memory: Decreased short-term memory Following Commands: Follows one step commands with increased time Safety/Judgement: Decreased awareness of safety, Decreased awareness of deficits Awareness: Intellectual Problem Solving: Slow processing, Decreased initiation, Requires verbal cues, Requires tactile cues, Difficulty sequencing General Comments: pt pointing to empty corner stating he is seeing people there, needing increased cues for sequencing mobility task     General Comments  VSS on RA    Exercises     Shoulder Instructions      Home Living Family/patient expects to be  discharged to:: Private residence Living Arrangements: Spouse/significant other Available Help at Discharge: Family;Personal care attendant (aide 24 hrs/day) Type of Home: House Home Access: Ramped entrance   Entrance Stairs-Rails: Left Home Layout: One level     Bathroom Shower/Tub: Walk-in shower         Home Equipment: Conservation officer, nature (2 wheels);Cane - single point;Grab bars - tub/shower;Shower seat;Wheelchair - manual          Prior Functioning/Environment Prior Level of Function : Needs assist;History of Falls (last six months)             Mobility Comments: Mostly using SPC, and RW, but forgetful and not using regularly. Multiple falls. ADLs Comments: States he needed help buttoning shirt. But bath/dress himself        OT Problem List: Decreased strength;Decreased range of motion;Decreased activity tolerance;Impaired balance (sitting and/or standing);Decreased cognition;Decreased coordination;Decreased safety awareness;Impaired UE functional use      OT Treatment/Interventions: Self-care/ADL training;Therapeutic exercise;Energy conservation;DME and/or AE instruction;Therapeutic activities;Patient/family education;Balance training    OT Goals(Current goals can be found in the care plan section) Acute Rehab OT Goals Patient Stated Goal: none stated OT Goal Formulation: With patient Time For Goal Achievement: 02/17/22 Potential to Achieve Goals: Good ADL Goals Pt Will Perform Upper Body Dressing: with min assist;sitting Pt Will Transfer to Toilet: with min assist;bedside commode;squat pivot transfer;stand pivot transfer Additional ADL Goal #1: pt will sit EOB x10 mins with supervision in prep for ADLs  OT Frequency: Min 2X/week    Co-evaluation              AM-PAC OT "6 Clicks" Daily Activity     Outcome Measure Help from another person eating meals?: A Little Help from another person taking care of personal grooming?: A Little Help from  another person  toileting, which includes using toliet, bedpan, or urinal?: Total Help from another person bathing (including washing, rinsing, drying)?: A Lot Help from another person to put on and taking off regular upper body clothing?: A Lot Help from another person to put on and taking off regular lower body clothing?: A Lot 6 Click Score: 13   End of Session Equipment Utilized During Treatment: Gait belt;Rolling walker (2 wheels);Back brace Nurse Communication: Mobility status  Activity Tolerance: Patient tolerated treatment well Patient left: in bed;with call bell/phone within reach;with bed alarm set;with nursing/sitter in room (bed in chair position)  OT Visit Diagnosis: Unsteadiness on feet (R26.81);Other abnormalities of gait and mobility (R26.89);Muscle weakness (generalized) (M62.81);History of falling (Z91.81)                Time: 0017-4944 OT Time Calculation (min): 23 min Charges:  OT General Charges $OT Visit: 1 Visit OT Evaluation $OT Eval Moderate Complexity: 1 Mod OT Treatments $Self Care/Home Management : 8-22 mins  Renaye Rakers, OTD, OTR/L SecureChat Preferred Acute Rehab [phone]  Renaye Rakers [place] 02/03/2022, 12:10 PM

## 2022-02-03 NOTE — Progress Notes (Signed)
Mobility Specialist - Progress Note   02/03/22 1425  Mobility  Activity Dangled on edge of bed  Level of Assistance Maximum assist, patient does 25-49%  Activity Response Tolerated poorly  Mobility Referral Yes  $Mobility charge 1 Mobility   Pt was received in bed and agreeable to get up in chair. Pt was MaxA to get to EOB and ModA to sit up EOB. Pt had x3 attempt to stand. Pt became frustrated and refused further mobility and requested to lay back in bed. Pt was returned to bed with all needs met.   Franki Monte  Mobility Specialist Please contact via Solicitor or Rehab office at [phone]

## 2022-02-04 DIAGNOSIS — F09 Unspecified mental disorder due to known physiological condition: Secondary | ICD-10-CM

## 2022-02-04 DIAGNOSIS — N179 Acute kidney failure, unspecified: Secondary | ICD-10-CM | POA: Diagnosis not present

## 2022-02-04 DIAGNOSIS — I441 Atrioventricular block, second degree: Secondary | ICD-10-CM | POA: Diagnosis not present

## 2022-02-04 DIAGNOSIS — R55 Syncope and collapse: Secondary | ICD-10-CM | POA: Diagnosis not present

## 2022-02-04 DIAGNOSIS — G9340 Encephalopathy, unspecified: Secondary | ICD-10-CM

## 2022-02-04 DIAGNOSIS — I459 Conduction disorder, unspecified: Secondary | ICD-10-CM | POA: Diagnosis not present

## 2022-02-04 DIAGNOSIS — G9341 Metabolic encephalopathy: Secondary | ICD-10-CM | POA: Diagnosis not present

## 2022-02-04 DIAGNOSIS — S32010A Wedge compression fracture of first lumbar vertebra, initial encounter for closed fracture: Secondary | ICD-10-CM | POA: Diagnosis not present

## 2022-02-04 LAB — COMPREHENSIVE METABOLIC PANEL
ALT: 16 U/L (ref 0–44)
AST: 23 U/L (ref 15–41)
Albumin: 2.2 g/dL — ABNORMAL LOW (ref 3.5–5.0)
Alkaline Phosphatase: 60 U/L (ref 38–126)
Anion gap: 11 (ref 5–15)
BUN: 20 mg/dL (ref 8–23)
CO2: 25 mmol/L (ref 22–32)
Calcium: 8.8 mg/dL — ABNORMAL LOW (ref 8.9–10.3)
Chloride: 107 mmol/L (ref 98–111)
Creatinine, Ser: 1.3 mg/dL — ABNORMAL HIGH (ref 0.61–1.24)
GFR, Estimated: 53 mL/min — ABNORMAL LOW (ref >60)
Glucose, Bld: 108 mg/dL — ABNORMAL HIGH (ref 70–99)
Potassium: 3.4 mmol/L — ABNORMAL LOW (ref 3.5–5.1)
Sodium: 143 mmol/L (ref 135–145)
Total Bilirubin: 0.7 mg/dL (ref 0.3–1.2)
Total Protein: 5.1 g/dL — ABNORMAL LOW (ref 6.5–8.1)

## 2022-02-04 LAB — GLUCOSE, CAPILLARY
Glucose-Capillary: 78 mg/dL (ref 70–99)
Glucose-Capillary: 92 mg/dL (ref 70–99)
Glucose-Capillary: 99 mg/dL (ref 70–99)
Glucose-Capillary: 139 mg/dL — ABNORMAL HIGH (ref 70–99)

## 2022-02-04 LAB — MAGNESIUM: Magnesium: 1.4 mg/dL — ABNORMAL LOW (ref 1.7–2.4)

## 2022-02-04 MED ORDER — HYDRALAZINE HCL 20 MG/ML IJ SOLN: 10.0000 mg | INTRAMUSCULAR | Status: DC | PRN

## 2022-02-04 MED ORDER — POTASSIUM CHLORIDE CRYS ER 20 MEQ PO TBCR
60.0000 meq | EXTENDED_RELEASE_TABLET | Freq: Once | ORAL | Status: AC
  Administered 2022-02-04: 60 meq via ORAL
  Filled 2022-02-04: qty 3

## 2022-02-04 MED ORDER — MAGNESIUM SULFATE 4 GM/100ML IV SOLN
4.0000 g | Freq: Once | INTRAVENOUS | Status: AC
  Administered 2022-02-04: 4 g via INTRAVENOUS
  Filled 2022-02-04: qty 100

## 2022-02-04 MED ORDER — QUETIAPINE FUMARATE 25 MG PO TABS
25.0000 mg | ORAL_TABLET | Freq: Every day | ORAL | Status: DC
  Administered 2022-02-04 – 2022-02-05 (×2): 25 mg via ORAL
  Filled 2022-02-04 (×2): qty 1

## 2022-02-04 NOTE — Progress Notes (Signed)
Progress Note   Patient: [name] [name] BOF:[id number] DOB: 07/05/1932 DOA: 01/27/2022     7 DOS: the patient was seen and examined on 02/04/2022   Brief hospital course:  89 y.o. male with medical history significant of coronary artery disease, carotid stenosis, diabetes mellitus, hypertension, peptic ulcer disease, right bundle branch block, and more presents the ED with a chief complaint of fall.  Patient does not know how he fell.  Daughter at bedside provided most the history.  She reports that he was ambulating with his walker and he said he was moving a box out of the way and fell down.  She did not see the fall, but when she got to him immediately after he had abrasion on the back of his head, and could not get up.  Pt cannot clarify if he had LOC or not.  He was not complaining of chest pain or palpitations according to the daughter.  He had a similar fall a week PTA when the caregiver was there.  He was sore but seemed okay, but started complaining more more of low back pain since that first fall.  On the day of admission, pt had trouble getting up due to pain in his back. For the falls he had incontinence of bowel and bladder which appears to be a chronic issue.   Daughter reports that she has noticed a functional decline in him over the past few months.  She says that he has been more fatigued.  Pt was seen by cardiology who felt the patient had second-degree AV block type I, but could not rule out type II second-degree AV block.  As result, patient is being transferred to [facility] for EP evaluation and for cardiology to follow.    Assessment and Plan: Second-degree AV block -Appreciate cardiology consult -initial concern for heart block -No prolonged pauses or heart block since stopping Toprolol per Cardiology -Noted to have intermittent Mobitz 1 second degree AV block, no indication for pacemaker at this point.  -Cardiology has since signed off as of 1/29 -As pt has remained stable, tele  had been d/c'd for pt comfort. Discussed with Palliative Care  Paroxysmal aflutter -currently rate controlled at present -Cont to avoid AV nodal agents  -Per Cardiology, not good candidate for anticoagulation -Cardiology has since signed off   Acute metabolic encephalopathy -Patient has developed some hospital delirium since admission -family had reported cognitive functional decline over last 6 months. Suspect underlying decline PTA -B12 580 -TSH 0.978 -UA negative for pyuria -CT brain negative for acute findings -Mentation seems improved since transfer to [facility] and since receiving IVF.  -Overnight became combative, continue '25mg'$  seroquel nightly   Close compression fracture L1 -CT lumbar spine shows L1 fracture with 30-40% height loss -TLSO brace when up with PT -judicious opioids -Therapy following, thus far recs for SNF, TOC following   Acute on chronic renal failure--CKD stage IIIb -Baseline creatinine 1.4-1.6 -Serum creatinine peaked 2.31 -Holding irbesartan/HCTZ -Cr stable at 1.30 with IVF -recheck bmet in AM   Right Carotid stenosis -01/27/22 Carotid ultrasound--R ICA with 70-99% stenosis -11/08/2018 US carotid--R-ICA 60-79% stenosis -Appreciate input by Vascular Surgeon. While awake, pt voiced to Vascular Surgeon that he did now wish to move forward with surgery. Vascular recs to continue plavix and statin -Palliative Care now following.    Essential hypertension -Continue amlodipine '10mg'$  daily -Holding metoprolol secondary to heart block -Holding irbesartan secondary to acute kidney injury -Ordered PRN hydralazine   Controlled diabetes mellitus type  2  -01/27/2022 hemoglobin A1c 5.5 -NovoLog sliding scale -Glycemic trends stable, albeit soft -Pt not eating very well -Will hold levemir for now. Would resume as diet improves and glucose trends up   Coronary artery disease -Continue Plavix -Continue Crestor   Tobacco abuse -Tobacco cessation  discussed -nicoderm patch   COPD -Stable on room air   Mixed hyperlipidemia -Continue statin per above   Cervical spine stenosis -01/27/2022 CT cervical spine--multilevel DJD; 7 mm AP thecal sac stenosis C1-2>>appears unchanged from prior MRI C-spine 07/19/17 -Patient denies any neck pain -no extremity weakness  Hypokalemia -cont to correct as needed to goal of >4 -Recheck bmet in AM  Hypomagnesemia -Replace to goal >2      Subjective: Sedated this AM. Staff reports pt was combative towards staff  Physical Exam: Vitals:   02/03/22 2023 02/04/22 0415 02/04/22 0733 02/04/22 1542  BP: (!) 154/70 (!) 168/94 (!) 144/81 (!) 151/74  Pulse: 66 68 62 70  Resp: '18 20 16 18  '$ Temp: 97.9 F (36.6 C) 97.9 F (36.6 C)  (!) 97.3 F (36.3 C)  TempSrc: Oral Oral  Oral  SpO2: 96% 96% 98% 94%  Weight:  75.4 kg    Height:       General exam: Asleep, in no acute distress Respiratory system: normal chest rise, clear, no audible wheezing Cardiovascular system: regular rhythm, s1-s2 Gastrointestinal system: Nondistended, nontender, pos BS Central nervous system: No seizures, no tremors Extremities: No cyanosis, no joint deformities Skin: No rashes, no pallor Psychiatry: Unable to assess given mentation  Data Reviewed:  Labs reviewed: Na 143, K 3.4, Cr 1.30, Mg 1.4  Family Communication: Pt in room, family is at bedside  Disposition: Status is: Inpatient Remains inpatient appropriate because: Severity of illness  Planned Discharge Destination: Skilled nursing facility    Author: Marylu Lund, MD 02/04/2022 5:12 PM  For on call review [email or web address].

## 2022-02-04 NOTE — Progress Notes (Signed)
PT Cancellation Note  Patient Details Name: [name] [name] MRN: [id number] DOB: April 27, 1932   Cancelled Treatment:    Reason Eval/Treat Not Completed: Fatigue/lethargy limiting ability to participate. Received secure chat from RN that patient was better and allowed bathing, family requesting patient be seen. Re-attempted PT and patient is very lethargic, not following commands, not opening eyes on command. Unable to participate with PT at this time, RN notified. Will continue to follow and see as appropriate.    Brinlynn Gorton 02/04/2022, 1:16 PM

## 2022-02-04 NOTE — Progress Notes (Signed)
   PVR this am 530m. While attempting to straight cath, patient voided 250 mL. Attempted to bladder scan but pt was uncooperative, with this RN and nurse tech, assisting.Pt was pushing back the scanner probe during the process. Reported off to oncoming staff for follow up care

## 2022-02-04 NOTE — Progress Notes (Signed)
PT Cancellation Note  Patient Details Name: [name] [name] MRN: [id number] DOB: May 12, 1932   Cancelled Treatment:    Reason Eval/Treat Not Completed: Patient's level of consciousness. Patient agitated this am and was attempting to strike staff. Haldol given at 8am. Will continue to follow and treat when appropriate.    Eniya Cannady 02/04/2022, 8:44 AM

## 2022-02-04 NOTE — TOC Progression Note (Addendum)
Transition of Care [facility]) - Progression Note    Patient Details  Name: [name] [name] MRN: [id number] Date of Birth: [birth year]  Transition of Care [facility] CM/SW [place], [place] Phone Number: 02/04/2022, 2:27 PM  Clinical Narrative:     Update- CSW spoke with Billie Ruddy who offered SNF bed for patient. Graceanne informed CSW patients behaviors will need to improve at least for 24 hours and patient be able to participate with PT before can dc over. CSW informed patients daughter who accepted SNF bed offer.CSW will follow up with Graceanne tomorrow.  CSW provided patients daughter [name] with SNF bed offers. Patients daughter [name] request for CSW to check into [facility] to see if they can offer SNF bed for patient. CSW called and spoke with Graceanne with [facility] to see if she can offer SNF bed for patient. CSW LVM for [facility] ,awaiting call back to see if they can offer SNF bed for patient. CSW will continue to follow.   Expected Discharge Plan: [place] Barriers to Discharge: Continued Medical Work up  Expected Discharge Plan and Services In-house Referral: Clinical Social Work     Living arrangements for the past 2 months: Single Family Home                                       Social Determinants of Health (SDOH) Interventions SDOH Screenings   Food Insecurity: No Food Insecurity (02/04/2022)  Housing: Low Risk  (02/04/2022)  Transportation Needs: No Transportation Needs (02/04/2022)  Utilities: Not At Risk (02/04/2022)  Tobacco Use: High Risk (01/28/2022)    Readmission Risk Interventions     No data to display

## 2022-02-04 NOTE — Progress Notes (Signed)
Patient ID: KADARRIUS YANKE, male   DOB: 11-27-1932, 89 y.o.   MRN: [id number]    Progress Note from the Palliative Medicine Team at [facility]   Patient Name: [name] [name]        Date: 02/04/2022 DOB: Feb 26, 1932  Age: 89 y.o. MRN#: [id number] Attending Physician: Donne Hazel, MD Primary Care Physician: Prince Solian, MD Admit Date: 01/27/2022   Medical records reviewed    89 y.o. male with medical history significant of coronary artery disease, carotid stenosis, diabetes mellitus, hypertension, peptic ulcer disease, right bundle branch block, and more presents the ED with a chief complaint of fall.  Bradycardic in ER.  Family report ongoing physical, functional and mild cognitive decline over the past many months.   Patient transferred from [facility] to [facility] for further evaluation of significant cardiac disease.    Patient and family face treatment option decisions, advanced directive decisions and anticipatory care needs.  This NP assessed patient at the bedside as a follow up for palliative medicine needs and emotional support and to meet as scheduled with family.   Unfortunately [name] [name] is confused and unable to participate in medical decisions for himself at this time.  I met with patient's 4 children Consepcion Hearing, Awanda Mink, Drexel and Newman.  Continued education regarding current medical situation.  We reviewed patient's electronic medical record specific to head CT, lab values and physical therapy notes.   Explored again patient's past decisions to forego diagnostics and cardiac interventions offered to prolong life.   All family at bedside agree and support [name] [name]'s decisions to focus plan of care on comfort and dignity, hopes for natural death.  We reviewed patient's advance directive documents.  Education offered on hospice benefit; philosophy and eligibility.  MOST form introduced and a copy is left for review.   Plan of Care: -DNR/DNI--ultimately comfort and  dignity are focus of care -no artificial feeding now or in the future -no aggressive diagnostics or interventions for his significant cardiac disease -SNF for short term rehab- recommend OP palliative services at facility    -    Family's first choice for rehab center is [facility], [facility] or [facility] in [place] area.    Of note patient is a life time member of the [facility] [place] [facility].   PMT will continue to support holistically  Education offered today regarding  the importance of continued conversation with family and their  medical providers regarding overall plan of care and treatment options,  ensuring decisions are within the context of the patients values and GOCs.  Questions and concerns addressed   Discussed with Dr Wyline Copas and bedside RN   Total time spent on the unit was 75 minutes-greater than 50% of the time was spent in counseling and coordination of care.   Wadie Lessen NP  Palliative Medicine Team Team Phone # [phone] Pager [phone]

## 2022-02-04 NOTE — Progress Notes (Signed)
Patient agitated and striking at NT and RN when attempting to get CBG.  Refusing heparin SQ at this time. Haldol '5mg'$  IV given per prn order for agitation.  Will reassess in 52mnutes.

## 2022-02-04 NOTE — Plan of Care (Signed)
  Problem: Education: Goal: Ability to describe self-care measures that may prevent or decrease complications (Diabetes Survival Skills Education) will improve Outcome: Progressing Goal: Individualized Educational Video(s) Outcome: Progressing   Problem: Coping: Goal: Ability to adjust to condition or change in health will improve Outcome: Progressing   Problem: Fluid Volume: Goal: Ability to maintain a balanced intake and output will improve Outcome: Progressing   Problem: Health Behavior/Discharge Planning: Goal: Ability to identify and utilize available resources and services will improve Outcome: Progressing Goal: Ability to manage health-related needs will improve Outcome: Progressing   Problem: Metabolic: Goal: Ability to maintain appropriate glucose levels will improve Outcome: Progressing   Problem: Nutritional: Goal: Maintenance of adequate nutrition will improve Outcome: Progressing Goal: Progress toward achieving an optimal weight will improve Outcome: Progressing   Problem: Skin Integrity: Goal: Risk for impaired skin integrity will decrease Outcome: Progressing   Problem: Tissue Perfusion: Goal: Adequacy of tissue perfusion will improve Outcome: Progressing   Problem: Education: Goal: Knowledge of General Education information will improve Description: Including pain rating scale, medication(s)/side effects and non-pharmacologic comfort measures Outcome: Progressing   Problem: Health Behavior/Discharge Planning: Goal: Ability to manage health-related needs will improve Outcome: Progressing   Problem: Clinical Measurements: Goal: Ability to maintain clinical measurements within normal limits will improve Outcome: Progressing Goal: Will remain free from infection Outcome: Progressing Goal: Diagnostic test results will improve Outcome: Progressing Goal: Respiratory complications will improve Outcome: Progressing Goal: Cardiovascular complication will  be avoided Outcome: Progressing   Problem: Activity: Goal: Risk for activity intolerance will decrease Outcome: Progressing   Problem: Nutrition: Goal: Adequate nutrition will be maintained Outcome: Progressing   Problem: Coping: Goal: Level of anxiety will decrease Outcome: Progressing   Problem: Elimination: Goal: Will not experience complications related to bowel motility Outcome: Progressing Goal: Will not experience complications related to urinary retention Outcome: Progressing   Problem: Pain Managment: Goal: General experience of comfort will improve Outcome: Progressing   Problem: Safety: Goal: Ability to remain free from injury will improve Outcome: Progressing   Problem: Skin Integrity: Goal: Risk for impaired skin integrity will decrease Outcome: Progressing   Problem: Safety: Goal: Non-violent Restraint(s) Outcome: Progressing

## 2022-02-05 DIAGNOSIS — I459 Conduction disorder, unspecified: Secondary | ICD-10-CM | POA: Diagnosis not present

## 2022-02-05 LAB — COMPREHENSIVE METABOLIC PANEL
ALT: 16 U/L (ref 0–44)
AST: 20 U/L (ref 15–41)
Albumin: 2.4 g/dL — ABNORMAL LOW (ref 3.5–5.0)
Alkaline Phosphatase: 66 U/L (ref 38–126)
Anion gap: 12 (ref 5–15)
BUN: 20 mg/dL (ref 8–23)
CO2: 23 mmol/L (ref 22–32)
Calcium: 8.8 mg/dL — ABNORMAL LOW (ref 8.9–10.3)
Chloride: 103 mmol/L (ref 98–111)
Creatinine, Ser: 1.32 mg/dL — ABNORMAL HIGH (ref 0.61–1.24)
GFR, Estimated: 52 mL/min — ABNORMAL LOW (ref >60)
Glucose, Bld: 102 mg/dL — ABNORMAL HIGH (ref 70–99)
Potassium: 4.2 mmol/L (ref 3.5–5.1)
Sodium: 138 mmol/L (ref 135–145)
Total Bilirubin: 0.8 mg/dL (ref 0.3–1.2)
Total Protein: 5.5 g/dL — ABNORMAL LOW (ref 6.5–8.1)

## 2022-02-05 LAB — GLUCOSE, CAPILLARY
Glucose-Capillary: 114 mg/dL — ABNORMAL HIGH (ref 70–99)
Glucose-Capillary: 130 mg/dL — ABNORMAL HIGH (ref 70–99)
Glucose-Capillary: 171 mg/dL — ABNORMAL HIGH (ref 70–99)
Glucose-Capillary: 164 mg/dL — ABNORMAL HIGH (ref 70–99)

## 2022-02-05 LAB — MAGNESIUM: Magnesium: 2.1 mg/dL (ref 1.7–2.4)

## 2022-02-05 NOTE — Progress Notes (Signed)
Physical Therapy Treatment Patient Details Name: [name] [name] MRN: [id number] DOB: Jan 06, 1933 Today's Date: 02/05/2022   History of Present Illness 89 y.o. male ademitted 1/22 after a fall at home. Found to have Second-degree AV block, Acute metabolic encephalopathy, Closed compression fracture L1,and Acute on chronic renal failure. PMHx:  CAD, HTN, CKD3b, lung nodule, hypokalemia, R BBB.    PT Comments    Pt agreeable to working with therapy, however irritated when covers are removed. Pt provided with warm blanket in sitting and pt more pleasant. Pt requires modA for bed mobility, standing and transferring to recliner with RW. Pt with decreased command follow (difficult to determine whether cognition or HoH, likely both). Pt limited in safe mobility by decreased strength, balance and endurance, in presence of decreased safety awareness. D/c plans remain appropriate. PT will continue to follow acutely.    Recommendations for follow up therapy are one component of a multi-disciplinary discharge planning process, led by the attending physician.  Recommendations may be updated based on patient status, additional functional criteria and insurance authorization.  Follow Up Recommendations  Skilled nursing-short term rehab (<3 hours/day) Can patient physically be transported by private vehicle: Yes   Assistance Recommended at Discharge Frequent or constant Supervision/Assistance  Patient can return home with the following A lot of help with walking and/or transfers;A lot of help with bathing/dressing/bathroom;Assistance with cooking/housework;Assist for transportation;Help with stairs or ramp for entrance   Equipment Recommendations  None recommended by PT       Precautions / Restrictions Precautions Precautions: Back;Fall Precaution Comments: reports multiple falls at home Required Braces or Orthoses: Spinal Brace Spinal Brace: Thoracolumbosacral orthotic Restrictions Weight Bearing  Restrictions: No     Mobility  Bed Mobility Overal bed mobility: Needs Assistance Bed Mobility: Rolling, Sidelying to Sit, Sit to Sidelying Rolling: Mod assist Sidelying to sit: Mod assist       General bed mobility comments: modA for sequencing, max A for pulling pad to bring feet to floor    Transfers Overall transfer level: Needs assistance Equipment used: Rolling walker (2 wheels) Transfers: Sit to/from Stand, Bed to chair/wheelchair/BSC Sit to Stand: Mod assist   Step pivot transfers: Mod assist       General transfer comment: modA for coming to standing and for stepping to recliner, ignores/doesn't fully hear not to pull up on RW to come to standing in RW, multimodal cuing for staying upright until in front of recliner to sit    Ambulation/Gait               General Gait Details: deferred due to need for 2 people to safely progress ambulation       Balance Overall balance assessment: Needs assistance Sitting-balance support: No upper extremity supported, Feet supported Sitting balance-Leahy Scale: Fair     Standing balance support: Bilateral upper extremity supported, Reliant on assistive device for balance Standing balance-Leahy Scale: Poor                              Cognition Arousal/Alertness: Awake/alert Behavior During Therapy: WFL for tasks assessed/performed Overall Cognitive Status: Difficult to assess (HoH vs cognition) Area of Impairment: Following commands, Problem solving, Safety/judgement                   Current Attention Level: Focused   Following Commands: Follows one step commands with increased time, Follows one step commands inconsistently, Follows multi-step commands inconsistently Safety/Judgement: Decreased awareness of safety  Problem Solving: Slow processing, Decreased initiation, Requires verbal cues, Requires tactile cues, Difficulty sequencing General Comments: irritated when PT initially took  blankets down, however when provided with warm blankets in sitting, pt warms up and 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           General Comments General comments (skin integrity, edema, etc.): VSS on RA      Pertinent Vitals/Pain Pain Assessment Pain Assessment: Faces Faces Pain Scale: Hurts a little bit Pain Location: generalized groaning with movement, pt does not indicate any specific source of pain Pain Descriptors / Indicators: Discomfort     PT Goals (current goals can now be found in the care plan section) Acute Rehab PT Goals PT Goal Formulation: With patient Time For Goal Achievement: 02/14/22 Potential to Achieve Goals: Good Progress towards PT goals: Progressing toward goals    Frequency    Min 3X/week      PT Plan Current plan remains appropriate       AM-PAC PT "6 Clicks" Mobility   Outcome Measure  Help needed turning from your back to your side while in a flat bed without using bedrails?: A Lot Help needed moving from lying on your back to sitting on the side of a flat bed without using bedrails?: A Lot Help needed moving to and from a bed to a chair (including a wheelchair)?: A Lot Help needed standing up from a chair using your arms (e.g., wheelchair or bedside chair)?: A Lot Help needed to walk in hospital room?: A Lot Help needed climbing 3-5 steps with a railing? : Total 6 Click Score: 11    End of Session Equipment Utilized During Treatment: Gait belt;Back brace Activity Tolerance: Patient tolerated treatment well Patient left: in chair;with call bell/phone within reach;with chair alarm set Nurse Communication: Mobility status;Other (comment) (lunch arrived and pt requires assistance to eat) PT Visit Diagnosis: Unsteadiness on feet (R26.81);Other abnormalities of gait and mobility (R26.89);Repeated falls (R29.6);Muscle weakness (generalized) (M62.81);History of falling  (Z91.81);Difficulty in walking, not elsewhere classified (R26.2);Pain Pain - part of body:  (generalized)     Time: 0960-4540 PT Time Calculation (min) (ACUTE ONLY): 24 min  Charges:  $Therapeutic Activity: 23-37 mins                     Kymorah Korf B. Migdalia Dk PT, DPT Acute Rehabilitation Services Please use secure chat or  Call Office [phone]    [place] 02/05/2022, 12:23 PM

## 2022-02-05 NOTE — Care Management Important Message (Signed)
Important Message  Patient Details  Name: [name] [name] MRN: [id number] Date of Birth: Aug 30, 1932   Medicare Important Message Given:  Yes     [name] 02/05/2022, 11:21 AM

## 2022-02-05 NOTE — TOC Progression Note (Signed)
Transition of Care [facility]) - Progression Note    Patient Details  Name: [name] [name] MRN: [id number] Date of Birth: June 29, 1932  Transition of Care [facility]) CM/SW [place], [place] Phone Number: 02/05/2022, 1:06 PM  Clinical Narrative:     CSW spoke with Graceanne with [facility] who confirmed they can accept patient tomorrow for dc if medically ready. CSW informed MD. CSW will continue to follow and assist with patients dc planning needs.  Expected Discharge Plan: [place] Barriers to Discharge: Continued Medical Work up  Expected Discharge Plan and Services In-house Referral: Clinical Social Work     Living arrangements for the past 2 months: Single Family Home                                       Social Determinants of Health (SDOH) Interventions SDOH Screenings   Food Insecurity: No Food Insecurity (02/04/2022)  Housing: Low Risk  (02/04/2022)  Transportation Needs: No Transportation Needs (02/04/2022)  Utilities: Not At Risk (02/04/2022)  Tobacco Use: High Risk (01/28/2022)    Readmission Risk Interventions     No data to display

## 2022-02-05 NOTE — Progress Notes (Signed)
[facility] PROGRESS NOTE  THANOS COUSINEAU (DOB: 1932-08-15) KAJ:[id number] PCP: Prince Solian, MD  Brief Narrative: [name] [name] is an 89 y.o. male with medical history significant of coronary artery disease, carotid stenosis, diabetes mellitus, hypertension, peptic ulcer disease, right bundle branch block, and more presents the ED with a chief complaint of fall.  Patient does not know how he fell.  Daughter at bedside provided most the history.  She reports that he was ambulating with his walker and he said he was moving a box out of the way and fell down.  She did not see the fall, but when she got to him immediately after he had abrasion on the back of his head, and could not get up.  Pt cannot clarify if he had LOC or not.  He was not complaining of chest pain or palpitations according to the daughter.  He had a similar fall a week PTA when the caregiver was there.  He was sore but seemed okay, but started complaining more more of low back pain since that first fall.  On the day of admission, pt had trouble getting up due to pain in his back. For the falls he had incontinence of bowel and bladder which appears to be a chronic issue.   Daughter reports that she has noticed a functional decline in him over the past few months.  She says that he has been more fatigued.  Pt was seen by cardiology who felt the patient had second-degree AV block type I, but could not rule out type II second-degree AV block.  As result, patient was transferred to [facility] for EP evaluation and for cardiology to follow. No prolonged pauses or heart block recurred after metoprolol was stopped. No pacemaker recommended at this time.   Subjective: No complaints. No recent need for medication for agitation or restraints.  Objective: BP (!) 146/64 (BP Location: Right Arm)   Pulse 64   Temp 98.1 F (36.7 C) (Oral)   Resp 18   Ht '5\' 3"'$  (1.6 m)   Wt 76.9 kg   SpO2 93%   BMI 30.03 kg/m   Gen: Elderly, chronically  ill-appearing male in no distress Pulm: Clear, no wheezes or crackles, nonlabored  CV: AFlutter, rate in 60's, no MRG or pitting edema. GI: Soft, NT, ND, +BS  Neuro: Rousable, interactive, follows commands without focal deficit.  Ext: Warm, no deformities Skin: No new rashes, lesions or ulcers on visualized skin   Assessment & Plan: Second-degree AV block -Appreciate cardiology consult -initial concern for heart block -No prolonged pauses or heart block since stopping Toprolol per Cardiology -Noted to have intermittent Mobitz 1 second degree AV block, no indication for pacemaker at this point.  -Cardiology has since signed off as of 1/29 -As pt has remained stable, tele had been d/c'd for pt comfort. Discussed with Palliative Care   Paroxysmal aflutter -currently rate controlled at present -Cont to avoid AV nodal agents  -Per Cardiology, not good candidate for anticoagulation -Cardiology has since signed off   Acute metabolic encephalopathy -Patient has developed some hospital delirium since admission -family had reported cognitive functional decline over last 6 months. Suspect underlying decline PTA -B12 580 -TSH 0.978 -UA negative for pyuria -CT brain negative for acute findings -Mentation seems improved since transfer to [facility], still some delirium though not requiring prn's. -Continue '25mg'$  seroquel nightly   Close compression fracture L1 -CT lumbar spine shows L1 fracture with 30-40% height loss -TLSO brace when up  with PT -judicious opioids -Therapy following, thus far recs for SNF, TOC following  AKI on CKD stage IIIb -Baseline creatinine 1.4-1.6 -Serum creatinine peaked 2.31 -Holding irbesartan/HCTZ -Cr stable at 1.3 currently. Ok to restart home meds.   Right Carotid stenosis -01/27/22 Carotid ultrasound--R ICA with 70-99% stenosis -11/08/2018 US carotid--R-ICA 60-79% stenosis - Vascular surgery, Dr. Carlis Abbott consulted: "This is asymptomatic carotid disease as he  denies any history of strokes or TIAs.  He feels he is at his neurologic baseline.  He states he has no interest in carotid surgery.  I discussed this would put him at a higher stroke risk without surgical intervention.  His son yesterday stated that his father does not want any carotid surgery based on previous discussions.  I discussed medical management and he is on Plavix statin which should be appropriate.  Please call vascular if he changes his mind.  He can follow-up with [place] [facility]."  -Palliative Care now following.    Essential hypertension -Continue amlodipine '10mg'$  daily -Holding metoprolol secondary to heart block -Holding irbesartan secondary to acute kidney injury -Ordered PRN hydralazine   Controlled diabetes mellitus type 2  -01/27/2022 hemoglobin A1c 5.5 -NovoLog sliding scale - At inpatient goal off home levemir.   Coronary artery disease -Continue Plavix -Continue Crestor   Tobacco abuse -Tobacco cessation discussed -nicoderm patch   COPD -Stable on room air   Mixed hyperlipidemia -Continue statin per above   Cervical spine stenosis -01/27/2022 CT cervical spine--multilevel DJD; 7 mm AP thecal sac stenosis C1-2>>appears unchanged from prior MRI C-spine 07/19/17 -Patient denies any neck pain -no extremity weakness   Hypokalemia -cont to correct as needed to goal of >4   Hypomagnesemia -Replace to goal >2  Patrecia Pour, MD [facility] [email or web address] 02/05/2022, 3:20 PM

## 2022-02-05 NOTE — Plan of Care (Signed)
  Problem: Education: Goal: Ability to describe self-care measures that may prevent or decrease complications (Diabetes Survival Skills Education) will improve Outcome: Progressing Goal: Individualized Educational Video(s) Outcome: Progressing   Problem: Coping: Goal: Ability to adjust to condition or change in health will improve Outcome: Progressing   Problem: Fluid Volume: Goal: Ability to maintain a balanced intake and output will improve Outcome: Progressing   Problem: Health Behavior/Discharge Planning: Goal: Ability to identify and utilize available resources and services will improve Outcome: Progressing Goal: Ability to manage health-related needs will improve Outcome: Progressing   Problem: Metabolic: Goal: Ability to maintain appropriate glucose levels will improve Outcome: Progressing   Problem: Nutritional: Goal: Maintenance of adequate nutrition will improve Outcome: Progressing Goal: Progress toward achieving an optimal weight will improve Outcome: Progressing   Problem: Skin Integrity: Goal: Risk for impaired skin integrity will decrease Outcome: Progressing   Problem: Tissue Perfusion: Goal: Adequacy of tissue perfusion will improve Outcome: Progressing   Problem: Education: Goal: Knowledge of General Education information will improve Description: Including pain rating scale, medication(s)/side effects and non-pharmacologic comfort measures Outcome: Progressing   Problem: Health Behavior/Discharge Planning: Goal: Ability to manage health-related needs will improve Outcome: Progressing   Problem: Clinical Measurements: Goal: Ability to maintain clinical measurements within normal limits will improve Outcome: Progressing Goal: Will remain free from infection Outcome: Progressing Goal: Diagnostic test results will improve Outcome: Progressing Goal: Respiratory complications will improve Outcome: Progressing Goal: Cardiovascular complication will  be avoided Outcome: Progressing   Problem: Activity: Goal: Risk for activity intolerance will decrease Outcome: Progressing   Problem: Nutrition: Goal: Adequate nutrition will be maintained Outcome: Progressing   Problem: Coping: Goal: Level of anxiety will decrease Outcome: Progressing   Problem: Elimination: Goal: Will not experience complications related to bowel motility Outcome: Progressing Goal: Will not experience complications related to urinary retention Outcome: Progressing   Problem: Pain Managment: Goal: General experience of comfort will improve Outcome: Progressing   Problem: Safety: Goal: Ability to remain free from injury will improve Outcome: Progressing   Problem: Skin Integrity: Goal: Risk for impaired skin integrity will decrease Outcome: Progressing   Problem: Safety: Goal: Non-violent Restraint(s) Outcome: Progressing

## 2022-02-06 DIAGNOSIS — Z7401 Bed confinement status: Secondary | ICD-10-CM | POA: Diagnosis not present

## 2022-02-06 DIAGNOSIS — F32A Depression, unspecified: Secondary | ICD-10-CM | POA: Diagnosis not present

## 2022-02-06 DIAGNOSIS — J189 Pneumonia, unspecified organism: Secondary | ICD-10-CM | POA: Diagnosis not present

## 2022-02-06 DIAGNOSIS — I6521 Occlusion and stenosis of right carotid artery: Secondary | ICD-10-CM | POA: Diagnosis not present

## 2022-02-06 DIAGNOSIS — U071 COVID-19: Secondary | ICD-10-CM | POA: Diagnosis not present

## 2022-02-06 DIAGNOSIS — Z8546 Personal history of malignant neoplasm of prostate: Secondary | ICD-10-CM | POA: Diagnosis not present

## 2022-02-06 DIAGNOSIS — F17200 Nicotine dependence, unspecified, uncomplicated: Secondary | ICD-10-CM | POA: Diagnosis not present

## 2022-02-06 DIAGNOSIS — M25461 Effusion, right knee: Secondary | ICD-10-CM | POA: Diagnosis not present

## 2022-02-06 DIAGNOSIS — J449 Chronic obstructive pulmonary disease, unspecified: Secondary | ICD-10-CM | POA: Diagnosis not present

## 2022-02-06 DIAGNOSIS — R0902 Hypoxemia: Secondary | ICD-10-CM | POA: Diagnosis not present

## 2022-02-06 DIAGNOSIS — R946 Abnormal results of thyroid function studies: Secondary | ICD-10-CM | POA: Diagnosis not present

## 2022-02-06 DIAGNOSIS — M25561 Pain in right knee: Secondary | ICD-10-CM | POA: Diagnosis not present

## 2022-02-06 DIAGNOSIS — M79661 Pain in right lower leg: Secondary | ICD-10-CM | POA: Diagnosis not present

## 2022-02-06 DIAGNOSIS — M545 Low back pain, unspecified: Secondary | ICD-10-CM | POA: Diagnosis not present

## 2022-02-06 DIAGNOSIS — N179 Acute kidney failure, unspecified: Secondary | ICD-10-CM | POA: Diagnosis not present

## 2022-02-06 DIAGNOSIS — S32010A Wedge compression fracture of first lumbar vertebra, initial encounter for closed fracture: Secondary | ICD-10-CM | POA: Diagnosis not present

## 2022-02-06 DIAGNOSIS — K219 Gastro-esophageal reflux disease without esophagitis: Secondary | ICD-10-CM | POA: Diagnosis not present

## 2022-02-06 DIAGNOSIS — E1122 Type 2 diabetes mellitus with diabetic chronic kidney disease: Secondary | ICD-10-CM | POA: Diagnosis not present

## 2022-02-06 DIAGNOSIS — R062 Wheezing: Secondary | ICD-10-CM | POA: Diagnosis not present

## 2022-02-06 DIAGNOSIS — Z4789 Encounter for other orthopedic aftercare: Secondary | ICD-10-CM | POA: Diagnosis not present

## 2022-02-06 DIAGNOSIS — R41841 Cognitive communication deficit: Secondary | ICD-10-CM | POA: Diagnosis not present

## 2022-02-06 DIAGNOSIS — K627 Radiation proctitis: Secondary | ICD-10-CM | POA: Diagnosis not present

## 2022-02-06 DIAGNOSIS — J302 Other seasonal allergic rhinitis: Secondary | ICD-10-CM | POA: Diagnosis not present

## 2022-02-06 DIAGNOSIS — Z7409 Other reduced mobility: Secondary | ICD-10-CM | POA: Diagnosis not present

## 2022-02-06 DIAGNOSIS — W19XXXD Unspecified fall, subsequent encounter: Secondary | ICD-10-CM | POA: Diagnosis not present

## 2022-02-06 DIAGNOSIS — I4892 Unspecified atrial flutter: Secondary | ICD-10-CM | POA: Diagnosis not present

## 2022-02-06 DIAGNOSIS — N189 Chronic kidney disease, unspecified: Secondary | ICD-10-CM | POA: Diagnosis not present

## 2022-02-06 DIAGNOSIS — M5451 Vertebrogenic low back pain: Secondary | ICD-10-CM | POA: Diagnosis not present

## 2022-02-06 DIAGNOSIS — E875 Hyperkalemia: Secondary | ICD-10-CM | POA: Diagnosis not present

## 2022-02-06 DIAGNOSIS — I2510 Atherosclerotic heart disease of native coronary artery without angina pectoris: Secondary | ICD-10-CM | POA: Diagnosis not present

## 2022-02-06 DIAGNOSIS — R0602 Shortness of breath: Secondary | ICD-10-CM | POA: Diagnosis not present

## 2022-02-06 DIAGNOSIS — I129 Hypertensive chronic kidney disease with stage 1 through stage 4 chronic kidney disease, or unspecified chronic kidney disease: Secondary | ICD-10-CM | POA: Diagnosis not present

## 2022-02-06 DIAGNOSIS — B338 Other specified viral diseases: Secondary | ICD-10-CM | POA: Diagnosis not present

## 2022-02-06 DIAGNOSIS — M6281 Muscle weakness (generalized): Secondary | ICD-10-CM | POA: Diagnosis not present

## 2022-02-06 DIAGNOSIS — J9601 Acute respiratory failure with hypoxia: Secondary | ICD-10-CM | POA: Diagnosis not present

## 2022-02-06 DIAGNOSIS — R262 Difficulty in walking, not elsewhere classified: Secondary | ICD-10-CM | POA: Diagnosis not present

## 2022-02-06 DIAGNOSIS — D508 Other iron deficiency anemias: Secondary | ICD-10-CM | POA: Diagnosis not present

## 2022-02-06 DIAGNOSIS — E876 Hypokalemia: Secondary | ICD-10-CM | POA: Diagnosis not present

## 2022-02-06 DIAGNOSIS — N1832 Chronic kidney disease, stage 3b: Secondary | ICD-10-CM | POA: Diagnosis not present

## 2022-02-06 DIAGNOSIS — J441 Chronic obstructive pulmonary disease with (acute) exacerbation: Secondary | ICD-10-CM | POA: Diagnosis not present

## 2022-02-06 DIAGNOSIS — B352 Tinea manuum: Secondary | ICD-10-CM | POA: Diagnosis not present

## 2022-02-06 DIAGNOSIS — I441 Atrioventricular block, second degree: Secondary | ICD-10-CM | POA: Diagnosis not present

## 2022-02-06 DIAGNOSIS — R059 Cough, unspecified: Secondary | ICD-10-CM | POA: Diagnosis not present

## 2022-02-06 DIAGNOSIS — S32010D Wedge compression fracture of first lumbar vertebra, subsequent encounter for fracture with routine healing: Secondary | ICD-10-CM | POA: Diagnosis not present

## 2022-02-06 DIAGNOSIS — Z7902 Long term (current) use of antithrombotics/antiplatelets: Secondary | ICD-10-CM | POA: Diagnosis not present

## 2022-02-06 DIAGNOSIS — D649 Anemia, unspecified: Secondary | ICD-10-CM | POA: Diagnosis not present

## 2022-02-06 DIAGNOSIS — F17210 Nicotine dependence, cigarettes, uncomplicated: Secondary | ICD-10-CM | POA: Diagnosis not present

## 2022-02-06 DIAGNOSIS — R509 Fever, unspecified: Secondary | ICD-10-CM | POA: Diagnosis not present

## 2022-02-06 DIAGNOSIS — I10 Essential (primary) hypertension: Secondary | ICD-10-CM | POA: Diagnosis not present

## 2022-02-06 DIAGNOSIS — M4802 Spinal stenosis, cervical region: Secondary | ICD-10-CM | POA: Diagnosis not present

## 2022-02-06 DIAGNOSIS — Z9181 History of falling: Secondary | ICD-10-CM | POA: Diagnosis not present

## 2022-02-06 DIAGNOSIS — E785 Hyperlipidemia, unspecified: Secondary | ICD-10-CM | POA: Diagnosis not present

## 2022-02-06 DIAGNOSIS — Z794 Long term (current) use of insulin: Secondary | ICD-10-CM | POA: Diagnosis not present

## 2022-02-06 DIAGNOSIS — Z79899 Other long term (current) drug therapy: Secondary | ICD-10-CM | POA: Diagnosis not present

## 2022-02-06 DIAGNOSIS — Z720 Tobacco use: Secondary | ICD-10-CM | POA: Diagnosis not present

## 2022-02-06 DIAGNOSIS — R7989 Other specified abnormal findings of blood chemistry: Secondary | ICD-10-CM | POA: Diagnosis present

## 2022-02-06 DIAGNOSIS — I459 Conduction disorder, unspecified: Secondary | ICD-10-CM | POA: Diagnosis not present

## 2022-02-06 DIAGNOSIS — K529 Noninfective gastroenteritis and colitis, unspecified: Secondary | ICD-10-CM | POA: Diagnosis not present

## 2022-02-06 DIAGNOSIS — E782 Mixed hyperlipidemia: Secondary | ICD-10-CM | POA: Diagnosis not present

## 2022-02-06 DIAGNOSIS — N1831 Chronic kidney disease, stage 3a: Secondary | ICD-10-CM | POA: Diagnosis not present

## 2022-02-06 DIAGNOSIS — E119 Type 2 diabetes mellitus without complications: Secondary | ICD-10-CM | POA: Diagnosis not present

## 2022-02-06 DIAGNOSIS — R410 Disorientation, unspecified: Secondary | ICD-10-CM | POA: Diagnosis not present

## 2022-02-06 DIAGNOSIS — G9341 Metabolic encephalopathy: Secondary | ICD-10-CM | POA: Diagnosis not present

## 2022-02-06 DIAGNOSIS — Z85828 Personal history of other malignant neoplasm of skin: Secondary | ICD-10-CM | POA: Diagnosis not present

## 2022-02-06 LAB — GLUCOSE, CAPILLARY
Glucose-Capillary: 89 mg/dL (ref 70–99)
Glucose-Capillary: 207 mg/dL — ABNORMAL HIGH (ref 70–99)

## 2022-02-06 MED ORDER — LANTUS SOLOSTAR 100 UNIT/ML ~~LOC~~ SOPN: 7.0000 [IU] | PEN_INJECTOR | Freq: Every day | SUBCUTANEOUS | Status: DC

## 2022-02-06 MED ORDER — PANTOPRAZOLE SODIUM 40 MG PO TBEC: 40.0000 mg | DELAYED_RELEASE_TABLET | Freq: Every day | ORAL | Status: AC

## 2022-02-06 NOTE — Plan of Care (Signed)
  Problem: Education: Goal: Ability to describe self-care measures that may prevent or decrease complications (Diabetes Survival Skills Education) will improve Outcome: Adequate for Discharge Goal: Individualized Educational Video(s) Outcome: Adequate for Discharge   Problem: Coping: Goal: Ability to adjust to condition or change in health will improve Outcome: Adequate for Discharge   Problem: Fluid Volume: Goal: Ability to maintain a balanced intake and output will improve Outcome: Adequate for Discharge   Problem: Health Behavior/Discharge Planning: Goal: Ability to identify and utilize available resources and services will improve Outcome: Adequate for Discharge Goal: Ability to manage health-related needs will improve Outcome: Adequate for Discharge   Problem: Metabolic: Goal: Ability to maintain appropriate glucose levels will improve Outcome: Adequate for Discharge   Problem: Nutritional: Goal: Maintenance of adequate nutrition will improve Outcome: Adequate for Discharge Goal: Progress toward achieving an optimal weight will improve Outcome: Adequate for Discharge   Problem: Skin Integrity: Goal: Risk for impaired skin integrity will decrease Outcome: Adequate for Discharge   Problem: Tissue Perfusion: Goal: Adequacy of tissue perfusion will improve Outcome: Adequate for Discharge   Problem: Education: Goal: Knowledge of General Education information will improve Description: Including pain rating scale, medication(s)/side effects and non-pharmacologic comfort measures Outcome: Adequate for Discharge   Problem: Health Behavior/Discharge Planning: Goal: Ability to manage health-related needs will improve Outcome: Adequate for Discharge   Problem: Clinical Measurements: Goal: Ability to maintain clinical measurements within normal limits will improve Outcome: Adequate for Discharge Goal: Will remain free from infection Outcome: Adequate for Discharge Goal:  Diagnostic test results will improve Outcome: Adequate for Discharge Goal: Respiratory complications will improve Outcome: Adequate for Discharge Goal: Cardiovascular complication will be avoided Outcome: Adequate for Discharge   Problem: Activity: Goal: Risk for activity intolerance will decrease Outcome: Adequate for Discharge   Problem: Nutrition: Goal: Adequate nutrition will be maintained Outcome: Adequate for Discharge   Problem: Coping: Goal: Level of anxiety will decrease Outcome: Adequate for Discharge   Problem: Elimination: Goal: Will not experience complications related to bowel motility Outcome: Adequate for Discharge Goal: Will not experience complications related to urinary retention Outcome: Adequate for Discharge   Problem: Pain Managment: Goal: General experience of comfort will improve Outcome: Adequate for Discharge   Problem: Safety: Goal: Ability to remain free from injury will improve Outcome: Adequate for Discharge   Problem: Skin Integrity: Goal: Risk for impaired skin integrity will decrease Outcome: Adequate for Discharge   Problem: Safety: Goal: Non-violent Restraint(s) Outcome: Adequate for Discharge

## 2022-02-06 NOTE — TOC Transition Note (Addendum)
Transition of Care [facility]) - CM/SW Discharge Note   Patient Details  Name: [name] [name] MRN: [id number] Date of Birth: [birth year]  [facility] CM/SW Contact:  Milas Gain, [place] Phone Number: 02/06/2022, 10:48 AM   Clinical Narrative:     Patient will DC to: [facility] SN[facility]   Anticipated DC date: 02/06/2022  Family notified: [name]  Transport by: Corey Harold   ?  Per MD patient ready for DC to [facility] . RN, patient, patient's family, Judson Roch with [place] facility notified of DC. Discharge Summary sent to facility. RN given number for report tele# [phone] RM# 37. DC packet on chart. DNR signed by MD attached to patients DC packet. Ambulance transport requested for patient.  CSW signing off.    Final next level of care: Skilled Nursing Facility Barriers to Discharge: No Barriers Identified   Patient Goals and CMS Choice CMS Medicare.gov Compare Post Acute Care list provided to:: Patient Represenative (must comment) Choice offered to / list presented to : Adult Children  Discharge Placement                Patient chooses bed at: [facility] Patient to be transferred to facility by: [place] Name of family member notified: Donnell Patient and family notified of of transfer: 02/06/22  Discharge Plan and Services Additional resources added to the After Visit Summary for   In-house Referral: Clinical Social Work                                   Social Determinants of Health (SDOH) Interventions SDOH Screenings   Food Insecurity: No Food Insecurity (02/04/2022)  Housing: Low Risk  (02/04/2022)  Transportation Needs: No Transportation Needs (02/04/2022)  Utilities: Not At Risk (02/04/2022)  Tobacco Use: High Risk (01/28/2022)     Readmission Risk Interventions     No data to display

## 2022-02-06 NOTE — Discharge Summary (Signed)
Physician Discharge Summary   Patient: [name] [name] MRN: [id number] DOB: 1932/11/27  Admit date:     01/27/2022  Discharge date: 02/06/22  Discharge Physician: Patrecia Pour   PCP: Prince Solian, MD   Recommendations at discharge:  Monitor HR/rhythm, follow up with cardiology and avoid AV nodal agents.  Consider follow up with vascular surgery if carotid stenosis becomes symptomatic and patient wishes to pursue surgery.  Monitor blood glucose. Note decreased basal insulin considerably (from 30-36 units down to 7 units nightly) due to low-normal CBGs on lower dose and A1c 5.5%.  Note change omeprazole to pantoprazole to avoid interaction with plavix.  Patient experienced hospital delirium while admitted. This was improved considerably with seroquel '25mg'$  qHS. Consider this if persistent at facility or if readmitted. Recommend TLSO brace when up and walking with PT for L1 compression fracture.  Monitor BMP and CBC in the next week.  Discharge Diagnoses: Principal Problem:   Heart block Active Problems:   Diabetes mellitus type 2 in nonobese [facility])   Essential hypertension   Coronary atherosclerosis   Acute renal failure superimposed on stage 3b chronic kidney disease (HCC)   Carotid stenosis   Closed compression fracture of L1 vertebra (HCC)   GERD (gastroesophageal reflux disease)   Acute metabolic encephalopathy   Heart block AV second degree  Hospital Course: [name] [name] is an 89 y.o. male with medical history significant of coronary artery disease, carotid stenosis, diabetes mellitus, hypertension, peptic ulcer disease, right bundle branch block, and more presents the ED with a chief complaint of fall.  Patient does not know how he fell.  Daughter at bedside provided most the history.  She reports that he was ambulating with his walker and he said he was moving a box out of the way and fell down.  She did not see the fall, but when she got to him immediately after he had abrasion on  the back of his head, and could not get up.  Pt cannot clarify if he had LOC or not.  He was not complaining of chest pain or palpitations according to the daughter.  He had a similar fall a week PTA when the caregiver was there.  He was sore but seemed okay, but started complaining more more of low back pain since that first fall.  On the day of admission, pt had trouble getting up due to pain in his back. For the falls he had incontinence of bowel and bladder which appears to be a chronic issue.   Daughter reports that she has noticed a functional decline in him over the past few months.  She says that he has been more fatigued.  Pt was seen by cardiology who felt the patient had second-degree AV block type I, but could not rule out type II second-degree AV block.  As result, patient was transferred to [facility] for EP evaluation and for cardiology to follow. No prolonged pauses or heart block recurred after metoprolol was stopped. No pacemaker recommended at this time.   Assessment and Plan: Second-degree AV block -Appreciate cardiology consult -initial concern for heart block -No prolonged pauses or heart block since stopping Toprol per Cardiology -Noted to have intermittent Mobitz 1 second degree AV block, no indication for pacemaker at this point.  -Cardiology has since signed off as of 1/29   Paroxysmal aflutter -currently rate controlled at present -Cont to avoid AV nodal agents  -Per Cardiology, not good candidate for anticoagulation   Acute metabolic  encephalopathy -Patient has developed some hospital delirium since admission -family had reported cognitive functional decline over last 6 months. Suspect underlying decline PTA -B12 580 -TSH 0.978 -UA negative for pyuria -CT brain negative for acute findings -Mentation seems improved since transfer to [facility], still some delirium though not requiring prn's. -Continue '25mg'$  seroquel nightly used while admitted, consider this if recurrent at  facility.   Close compression fracture L1 -CT lumbar spine shows L1 fracture with 30-40% height loss -TLSO brace when up with PT -Therapy following, thus far recs for SNF, TOC following   AKI on CKD stage IIIb -Baseline creatinine 1.4-1.6 -Serum creatinine peaked 2.31 -Holding irbesartan/HCTZ -Cr stable at 1.3 currently. Ok to restart home meds.   Right Carotid stenosis -01/27/22 Carotid ultrasound--R ICA with 70-99% stenosis -11/08/2018 US carotid--R-ICA 60-79% stenosis - Vascular surgery, Dr. Carlis Abbott consulted: "This is asymptomatic carotid disease as he denies any history of strokes or TIAs.  He feels he is at his neurologic baseline.  He states he has no interest in carotid surgery.  I discussed this would put him at a higher stroke risk without surgical intervention.  His son yesterday stated that his father does not want any carotid surgery based on previous discussions.  I discussed medical management and he is on Plavix statin which should be appropriate.  Please call vascular if he changes his mind.  He can follow-up with [place] PRN."  -Palliative Care now following.    Essential hypertension -Continue amlodipine, restart ARB/HCTZ now that renal function returned to baseline.  -Holding metoprolol secondary to heart block   Controlled diabetes mellitus type 2  -01/27/2022 hemoglobin A1c 5.5. At inpatient goal on reduced levemir dosing, requiring only 1-3 units daily total of novolog sliding scale.    Coronary artery disease -Continue Plavix -Continue Crestor   Tobacco abuse -Tobacco cessation discussed -nicoderm patch while admitted   COPD -Stable on room air   Mixed hyperlipidemia -Continue statin per above   Cervical spine stenosis -01/27/2022 CT cervical spine--multilevel DJD; 7 mm AP thecal sac stenosis C1-2>>appears unchanged from prior MRI C-spine 07/19/17 -Patient denies any neck pain -no extremity weakness   Hypokalemia -cont to correct as needed to goal of >4    Hypomagnesemia -Replace to goal >2  Consultants: Cardiology, vascular surgery Procedures performed: None  Disposition: Skilled nursing facility Diet recommendation:  Cardiac and Carb modified diet DISCHARGE MEDICATION: Allergies as of 02/06/2022   No Known Allergies      Medication List     STOP taking these medications    gabapentin 100 MG capsule Commonly known as: NEURONTIN   ibuprofen 200 MG tablet Commonly known as: ADVIL   metoprolol succinate 25 MG 24 hr tablet Commonly known as: TOPROL-XL   omeprazole 20 MG capsule Commonly known as: PRILOSEC Replaced by: pantoprazole 40 MG tablet       TAKE these medications    acetaminophen 325 MG tablet Commonly known as: TYLENOL Take 650 mg by mouth every 6 (six) hours as needed.   amLODipine 5 MG tablet Commonly known as: NORVASC Take 5 mg by mouth daily.   cholestyramine 4 g packet Commonly known as: QUESTRAN Take 1 packet by mouth 2 (two) times daily.   clopidogrel 75 MG tablet Commonly known as: PLAVIX Take 1 tablet (75 mg total) by mouth daily.   diphenoxylate-atropine 2.5-0.025 MG tablet Commonly known as: LOMOTIL Take 1 tablet by mouth 4 (four) times daily as needed for diarrhea or loose stools.   escitalopram 10 MG tablet  Commonly known as: LEXAPRO Take 10 mg by mouth daily.   ferrous sulfate 325 (65 FE) MG EC tablet Take 325 mg by mouth daily with breakfast.   irbesartan-hydrochlorothiazide 300-12.5 MG tablet Commonly known as: AVALIDE Take 1 tablet by mouth daily.   Lantus SoloStar 100 UNIT/ML Solostar Pen Generic drug: insulin glargine Inject 7 Units into the skin at bedtime. What changed:  how much to take additional instructions   pantoprazole 40 MG tablet Commonly known as: PROTONIX Take 1 tablet (40 mg total) by mouth daily. Replaces: omeprazole 20 MG capsule   rosuvastatin 5 MG tablet Commonly known as: CRESTOR Take 5 mg by mouth daily.   Tradjenta 5 MG Tabs  tablet Generic drug: linagliptin Take 5 mg by mouth daily.        Follow-up Information     Avva, Ravisankar, MD Follow up.   Specialty: Internal Medicine Contact information: [address] [place] [address] [phone]                Discharge Exam: Danley Danker Weights   02/02/22 4270 02/04/22 0415 02/05/22 0453  Weight: 77 kg 75.4 kg 76.9 kg  BP 117/68 (BP Location: Right Arm)   Pulse 62   Temp 98.3 F (36.8 C) (Oral)   Resp 16   Ht '5\' 3"'$  (1.6 m)   Wt 76.9 kg   SpO2 95%   BMI 30.03 kg/m   Elderly frail male in no acute distress getting up with TLSO and therapy this morning without complaints. Drinking normally.  Clear, nonlabored RRR, LUE with pitting edema after blown IV removed, stable, no erythema or tenderness. This is improving per staff.  Condition at discharge: stable  The results of significant diagnostics from this hospitalization (including imaging, microbiology, ancillary and laboratory) are listed below for reference.   Imaging Studies: ECHOCARDIOGRAM COMPLETE  Result Date: 01/28/2022    ECHOCARDIOGRAM REPORT   Patient Name:   CREIG LANDIN Date of Exam: 01/28/2022 Medical Rec #:  [id number]    Height:       63.0 in Accession #:    [id number]   Weight:       165.0 lb Date of Birth:  Mar 18, 1932     BSA:          1.782 m Patient Age:    78 years     BP:           144/79 mmHg Patient Gender: M            HR:           68 bpm. Exam Location:  [facility] Procedure: 2D Echo, Cardiac Doppler and Color Doppler Indications:    Syncope  History:        Patient has no prior history of Echocardiogram examinations.                 CAD, COPD; Risk Factors:Hypertension, Diabetes and Dyslipidemia.  Sonographer:    Wenda Low Referring Phys: [place] Comments: No subcostal window and Technically difficult study due to poor echo windows. Image acquisition challenging due to COPD. IMPRESSIONS  1. Left ventricular ejection fraction, by  estimation, is 65 to 70%. The left ventricle has normal function. The left ventricle has no regional wall motion abnormalities. There is mild concentric left ventricular hypertrophy. Left ventricular diastolic parameters are indeterminate.  2. Right ventricular systolic function is normal. The right ventricular size is normal. Tricuspid regurgitation signal is inadequate for assessing PA pressure.  3. Left atrial size was mild to moderately dilated.  4. The mitral valve is grossly normal, mildly calcified. Trivial mitral valve regurgitation.  5. The aortic valve is tricuspid. There is mild calcification of the aortic valve. Aortic valve regurgitation is not visualized. Aortic valve sclerosis is present, with no evidence of aortic valve stenosis. Aortic valve mean gradient measures 3.0 mmHg.  6. Aortic dilatation noted. There is mild dilatation of the aortic root, measuring 42 mm. There is mild dilatation of the ascending aorta, measuring 39 mm.  7. Unable to estimate CVP. Comparison(s): No prior Echocardiogram. FINDINGS  Left Ventricle: Left ventricular ejection fraction, by estimation, is 65 to 70%. The left ventricle has normal function. The left ventricle has no regional wall motion abnormalities. The left ventricular internal cavity size was normal in size. There is  mild concentric left ventricular hypertrophy. Left ventricular diastolic parameters are indeterminate. Right Ventricle: The right ventricular size is normal. No increase in right ventricular wall thickness. Right ventricular systolic function is normal. Tricuspid regurgitation signal is inadequate for assessing PA pressure. Left Atrium: Left atrial size was mild to moderately dilated. Right Atrium: Right atrial size was normal in size. Pericardium: There is no evidence of pericardial effusion. Mitral Valve: The mitral valve is grossly normal. There is mild calcification of the mitral valve leaflet(s). Trivial mitral valve regurgitation. MV peak  gradient, 6.9 mmHg. The mean mitral valve gradient is 2.0 mmHg. Tricuspid Valve: The tricuspid valve is grossly normal. Tricuspid valve regurgitation is trivial. Aortic Valve: The aortic valve is tricuspid. There is mild calcification of the aortic valve. There is mild aortic valve annular calcification. Aortic valve regurgitation is not visualized. Aortic valve sclerosis is present, with no evidence of aortic valve stenosis. Aortic valve mean gradient measures 3.0 mmHg. Aortic valve peak gradient measures 5.1 mmHg. Aortic valve area, by VTI measures 2.72 cm. Pulmonic Valve: The pulmonic valve was grossly normal. Pulmonic valve regurgitation is trivial. Aorta: Aortic dilatation noted. There is mild dilatation of the aortic root, measuring 42 mm. There is mild dilatation of the ascending aorta, measuring 39 mm. Venous: Unable to estimate CVP. The inferior vena cava was not well visualized. IAS/Shunts: No atrial level shunt detected by color flow Doppler.  LEFT VENTRICLE PLAX 2D LVIDd:         4.60 cm   Diastology LVIDs:         3.30 cm   LV e' medial:    14.10 cm/s LV PW:         1.10 cm   LV E/e' medial:  4.2 LV IVS:        1.20 cm   LV e' lateral:   16.10 cm/s LVOT diam:     2.00 cm   LV E/e' lateral: 3.7 LV SV:         75 LV SV Index:   42 LVOT Area:     3.14 cm  LEFT ATRIUM             Index        RIGHT ATRIUM           Index LA diam:        4.00 cm 2.24 cm/m   RA Area:     21.10 cm LA Vol (A2C):   79.5 ml 44.62 ml/m  RA Volume:   56.20 ml  31.54 ml/m LA Vol (A4C):   64.9 ml 36.42 ml/m LA Biplane Vol: 72.2 ml 40.52 ml/m  AORTIC VALVE AV Area (Vmax):  2.78 cm AV Area (Vmean):   2.38 cm AV Area (VTI):     2.72 cm AV Vmax:           113.00 cm/s AV Vmean:          74.600 cm/s AV VTI:            0.275 m AV Peak Grad:      5.1 mmHg AV Mean Grad:      3.0 mmHg LVOT Vmax:         99.90 cm/s LVOT Vmean:        56.600 cm/s LVOT VTI:          0.238 m LVOT/AV VTI ratio: 0.87  AORTA Ao Root diam: 4.20 cm Ao Asc  diam:  3.90 cm MITRAL VALVE MV Area (PHT): 2.75 cm     SHUNTS MV Area VTI:   2.33 cm     Systemic VTI:  0.24 m MV Peak grad:  6.9 mmHg     Systemic Diam: 2.00 cm MV Mean grad:  2.0 mmHg MV Vmax:       1.31 m/s MV Vmean:      61.7 cm/s MV Decel Time: 276 msec MV E velocity: 59.10 cm/s MV A velocity: 100.00 cm/s MV E/A ratio:  0.59 Rozann Lesches MD Electronically signed by Rozann Lesches MD Signature Date/Time: 01/28/2022/1:50:31 PM    Final    US Carotid Bilateral  Result Date: 01/28/2022 CLINICAL DATA:  89 year old male with a history of carotid stenosis EXAM: BILATERAL CAROTID DUPLEX ULTRASOUND TECHNIQUE: Pearline Cables scale imaging, color Doppler and duplex ultrasound were performed of bilateral carotid and vertebral arteries in the neck. COMPARISON:  None Available. FINDINGS: Criteria: Quantification of carotid stenosis is based on velocity parameters that correlate the residual internal carotid diameter with NASCET-based stenosis levels, using the diameter of the distal internal carotid lumen as the denominator for stenosis measurement. The following velocity measurements were obtained: RIGHT ICA:  Systolic 431 cm/sec, Diastolic 44 cm/sec CCA:  55 cm/sec SYSTOLIC ICA/CCA RATIO:  4.7 ECA:  201 cm/sec LEFT ICA:  Systolic 91 cm/sec, Diastolic 19 cm/sec CCA:  74 cm/sec SYSTOLIC ICA/CCA RATIO:  1.4 ECA:  100 cm/sec Right Brachial SBP: Not acquired Left Brachial SBP: Not acquired RIGHT CAROTID ARTERY: No significant calcifications of the right common carotid artery. Intermediate waveform maintained. Moderate heterogeneous and partially calcified plaque at the right carotid bifurcation. Shadowing present. Spectral broadening. Low resistance waveform of the right ICA. Some tortuosity. RIGHT VERTEBRAL ARTERY: Antegrade flow with low resistance waveform. LEFT CAROTID ARTERY: No significant calcifications of the left common carotid artery. Intermediate waveform maintained. Moderate heterogeneous and partially calcified  plaque at the left carotid bifurcation. Lumen shadowing. Low resistance waveform of the left ICA. Tortuosity. LEFT VERTEBRAL ARTERY:  Antegrade flow with low resistance waveform. IMPRESSION: Right: Heterogeneous and partially calcified plaque at the right carotid bifurcation contributes to 70%-99% stenosis by established duplex criteria. Left: Color duplex indicates moderate heterogeneous and calcified plaque, with no hemodynamically significant stenosis by duplex criteria in the extracranial cerebrovascular circulation. Signed, Dulcy Fanny. Nadene Rubins, RPVI [facility] Radiology Electronically Signed   By: Corrie Mckusick D.O.   On: 01/28/2022 10:27   CT L-SPINE NO CHARGE  Result Date: 01/27/2022 CLINICAL DATA:  Fall with back pain. EXAM: CT Thoracic and Lumbar spine without contrast TECHNIQUE: Multidetector CT imaging of the thoracic and lumbar spine was performed without intravenous contrast. Multiplanar CT image reconstructions were also generated. RADIATION DOSE REDUCTION:  This exam was performed according to the departmental dose-optimization program which includes automated exposure control, adjustment of the mA and/or kV according to patient size and/or use of iterative reconstruction technique. CONTRAST:  None. COMPARISON:  Last AP and lateral chest was 01/02/2011, last CT abdomen and pelvis with reconstructions was 02/21/2018. FINDINGS: CT THORACIC SPINE FINDINGS Segmentation: There are 12 rib-bearing thoracic type segments. Alignment: There is mild thoracic kyphodextroscoliosis without AP listhesis. Vertebrae: There is osteopenia without evidence of acute compression fracture. There is intact right anterolateral bridging enthesopathy multiple levels starting at T5 entering at T11. Spondylosis at all levels. No fracture or primary or pathologic process is seen apart from osteopenia. Paraspinal and other soft tissues: No acute findings. No fracture of the  visible posterior ribs is seen. Disc levels: The discs are partially degenerated and middle 1/3 of the thoracic spine. The greatest disc space loss, with vacuum phenomenon noted at T7-8. There is a posterior disc osteophyte complex at this level partially effacing the ventral CSF but no frank cord compression is evident. Without contrast no significant soft tissue or bony encroachment on the thecal sac is seen elsewhere. Hypertrophic facets impress on the dorsolateral thecal sac at T9-10 and T10-11 but do not compress the cord as far as seen. The foramina are moderately stenotic at T7-8, left-greater-than-right but are not stenotic at the remaining thoracic levels despite mild facet spurs. CT LUMBAR SPINE FINDINGS Segmentation: 5 lumbar type vertebrae. Alignment: There is a grade 1 degenerative spondylolisthesis at L4-5, and a minimal chronic L5-S1 degenerative retrolisthesis otherwise normal alignment, with anterolisthesis due to advanced facet hypertrophy at L4-5. Vertebrae: There is osteopenia. There is acute upper plate anterior wedge compression fracture deformity of the L1 vertebral body with nondisplaced fragmentation along the anterior upper plate, anterior vertebral height loss 30-40%, posterior height loss 20-25%, and retropulsion of the posterosuperior cortex up to 4 mm mildly effacing the ventral CSF. A linear transverse fracture line is noted underlying the compressed L1 upper plate. The pedicles and posterior elements are intact. There is no other evidence of fractures. Paraspinal and other soft tissues: The aorta, iliac arteries and visceral branch arteries are heavily calcified. There is no AAA. There is no paraspinal hematoma. There is some paraspinal edema noted alongside L1. Disc levels: There is mild flattening of the ventral thecal sac just below the level of T12-L1 due to the posterosuperior L1 cortical retropulsion. There is no significant spinal canal stenosis. There is a mild nonstenosing  posterior disc bulge at T12-L1. Normal disc height. The foramina are clear. L1-2: Moderate disc space loss. Bidirectional osteophytes are noted with mild spinal stenosis a posterior disc osteophyte complex. There is right-greater-than-left facet hypertrophy with severe right and moderate left foraminal stenosis. L2-3: The disc is normal in height. There are mild endplate spurs. There is mild spinal canal stenosis due to a diffuse disc bulge, ligamentous and facet hypertrophy. Mild-to-moderate right and mild left foraminal stenosis. L3-4: This disc is normal in height. There are small endplate spurs. Similar to L2 there is mild spinal canal stenosis due to dorsal ligamentous and facet hypertrophy and a broad posterior disc bulge. There is mild foraminal stenosis. L4-5: There is mild disc space loss. Diffuse annular bulge is seen. There are small endplate osteophytes. There is severe spinal canal stenosis due to advanced facet hypertrophy and dorsal ligamentous thickening. Vertical foraminal stenosis is seen with bilateral moderate to severe foraminal stenosis. L5-S1: The disc is normal in height. Broad posterior disc bulge is seen mildly  compressing the S1 nerve roots. There is mild spinal canal stenosis. There is mild facet hypertrophy with mild foraminal stenosis. Other: The SI joints are patent. No sacral insufficiency fracture seen. There is spurring of both anterior SI joints. IMPRESSION: 1. Osteopenia and degenerative change of the thoracic spine without evidence of thoracic spine fractures. 2. Mild thoracic kyphodextroscoliosis. 3. Lumbar spine CT: Acute upper plate anterior wedge compression fracture of the L1 vertebral body with nondisplaced fragmentation along the anterior upper plate, 30-40% anterior vertebral height loss, 20-25% posterior height loss, and 4 mm retropulsion of the posterosuperior cortex without significant spinal canal stenosis. 4. Osteopenia and degenerative change without evidence of  further lumbar spine fractures. 5. Severe acquired spinal canal stenosis at L4-5 with mild spinal canal stenosis at L2-3 and L3-4 and L5-S1. 6. Multilevel lumbar foraminal stenosis greatest on the right at L1-2 and bilaterally at L4-5. 7. Aortic and branch vessel atherosclerosis. Aortic Atherosclerosis (ICD10-I70.0). Electronically Signed   By: Telford Nab M.D.   On: 01/27/2022 23:58   CT T-SPINE NO CHARGE  Result Date: 01/27/2022 CLINICAL DATA:  Fall with back pain. EXAM: CT Thoracic and Lumbar spine without contrast TECHNIQUE: Multidetector CT imaging of the thoracic and lumbar spine was performed without intravenous contrast. Multiplanar CT image reconstructions were also generated. RADIATION DOSE REDUCTION: This exam was performed according to the departmental dose-optimization program which includes automated exposure control, adjustment of the mA and/or kV according to patient size and/or use of iterative reconstruction technique. CONTRAST:  None. COMPARISON:  Last AP and lateral chest was 01/02/2011, last CT abdomen and pelvis with reconstructions was 02/21/2018. FINDINGS: CT THORACIC SPINE FINDINGS Segmentation: There are 12 rib-bearing thoracic type segments. Alignment: There is mild thoracic kyphodextroscoliosis without AP listhesis. Vertebrae: There is osteopenia without evidence of acute compression fracture. There is intact right anterolateral bridging enthesopathy multiple levels starting at T5 entering at T11. Spondylosis at all levels. No fracture or primary or pathologic process is seen apart from osteopenia. Paraspinal and other soft tissues: No acute findings. No fracture of the visible posterior ribs is seen. Disc levels: The discs are partially degenerated and middle 1/3 of the thoracic spine. The greatest disc space loss, with vacuum phenomenon noted at T7-8. There is a posterior disc osteophyte complex at this level partially effacing the ventral CSF but no frank cord compression is  evident. Without contrast no significant soft tissue or bony encroachment on the thecal sac is seen elsewhere. Hypertrophic facets impress on the dorsolateral thecal sac at T9-10 and T10-11 but do not compress the cord as far as seen. The foramina are moderately stenotic at T7-8, left-greater-than-right but are not stenotic at the remaining thoracic levels despite mild facet spurs. CT LUMBAR SPINE FINDINGS Segmentation: 5 lumbar type vertebrae. Alignment: There is a grade 1 degenerative spondylolisthesis at L4-5, and a minimal chronic L5-S1 degenerative retrolisthesis otherwise normal alignment, with anterolisthesis due to advanced facet hypertrophy at L4-5. Vertebrae: There is osteopenia. There is acute upper plate anterior wedge compression fracture deformity of the L1 vertebral body with nondisplaced fragmentation along the anterior upper plate, anterior vertebral height loss 30-40%, posterior height loss 20-25%, and retropulsion of the posterosuperior cortex up to 4 mm mildly effacing the ventral CSF. A linear transverse fracture line is noted underlying the compressed L1 upper plate. The pedicles and posterior elements are intact. There is no other evidence of fractures. Paraspinal and other soft tissues: The aorta, iliac arteries and visceral branch arteries are heavily calcified. There is no AAA.  There is no paraspinal hematoma. There is some paraspinal edema noted alongside L1. Disc levels: There is mild flattening of the ventral thecal sac just below the level of T12-L1 due to the posterosuperior L1 cortical retropulsion. There is no significant spinal canal stenosis. There is a mild nonstenosing posterior disc bulge at T12-L1. Normal disc height. The foramina are clear. L1-2: Moderate disc space loss. Bidirectional osteophytes are noted with mild spinal stenosis a posterior disc osteophyte complex. There is right-greater-than-left facet hypertrophy with severe right and moderate left foraminal stenosis.  L2-3: The disc is normal in height. There are mild endplate spurs. There is mild spinal canal stenosis due to a diffuse disc bulge, ligamentous and facet hypertrophy. Mild-to-moderate right and mild left foraminal stenosis. L3-4: This disc is normal in height. There are small endplate spurs. Similar to L2 there is mild spinal canal stenosis due to dorsal ligamentous and facet hypertrophy and a broad posterior disc bulge. There is mild foraminal stenosis. L4-5: There is mild disc space loss. Diffuse annular bulge is seen. There are small endplate osteophytes. There is severe spinal canal stenosis due to advanced facet hypertrophy and dorsal ligamentous thickening. Vertical foraminal stenosis is seen with bilateral moderate to severe foraminal stenosis. L5-S1: The disc is normal in height. Broad posterior disc bulge is seen mildly compressing the S1 nerve roots. There is mild spinal canal stenosis. There is mild facet hypertrophy with mild foraminal stenosis. Other: The SI joints are patent. No sacral insufficiency fracture seen. There is spurring of both anterior SI joints. IMPRESSION: 1. Osteopenia and degenerative change of the thoracic spine without evidence of thoracic spine fractures. 2. Mild thoracic kyphodextroscoliosis. 3. Lumbar spine CT: Acute upper plate anterior wedge compression fracture of the L1 vertebral body with nondisplaced fragmentation along the anterior upper plate, 30-40% anterior vertebral height loss, 20-25% posterior height loss, and 4 mm retropulsion of the posterosuperior cortex without significant spinal canal stenosis. 4. Osteopenia and degenerative change without evidence of further lumbar spine fractures. 5. Severe acquired spinal canal stenosis at L4-5 with mild spinal canal stenosis at L2-3 and L3-4 and L5-S1. 6. Multilevel lumbar foraminal stenosis greatest on the right at L1-2 and bilaterally at L4-5. 7. Aortic and branch vessel atherosclerosis. Aortic Atherosclerosis  (ICD10-I70.0). Electronically Signed   By: Telford Nab M.D.   On: 01/27/2022 23:58   CT CHEST ABDOMEN PELVIS WO CONTRAST  Result Date: 01/27/2022 CLINICAL DATA:  Frequent falls, most recently today. EXAM: CT CHEST, ABDOMEN AND PELVIS WITHOUT CONTRAST TECHNIQUE: Multidetector CT imaging of the chest, abdomen and pelvis was performed following the standard protocol without IV contrast. RADIATION DOSE REDUCTION: This exam was performed according to the departmental dose-optimization program which includes automated exposure control, adjustment of the mA and/or kV according to patient size and/or use of iterative reconstruction technique. COMPARISON:  CT abdomen and pelvis without contrast 02/21/2018, CT abdomen and pelvis 11/21/2017 with contrast. No prior chest CT. Nutrition chest x-ray was PA Lat 01/02/2011. FINDINGS: CT CHEST FINDINGS Cardiovascular: The heart is slightly enlarged. Coronary arteries are heavily calcified. There are patchy calcifications in the aorta with tortuosity in the descending segment and mild dilatation in the aortic root at the sinuses of Valsalva and in the ascending segment both of which measure 4.2 cm. The pulmonary trunk is prominent at 3.2 cm indicating arterial hypertension and there is a small pericardial effusion. The pulmonary veins are decompressed. There are scattered calcifications of the great vessels. Mediastinum/Nodes: No enlarged mediastinal, hilar, or axillary lymph nodes. Thyroid  gland, trachea, and esophagus demonstrate no significant findings. There is a small amount of scattered retained secretions in the trachea. The main bronchi are clear. Lungs/Pleura: There are bilateral trace pleural effusions. There is no pneumothorax. No pulmonary contusion or confluent airspace infiltrate is seen. There is diffuse bronchial thickening without visible bronchial plugging. There are a few small scattered thin walled air cysts on the right, scattered linear atelectasis in the  posterior basal lower lobes, lingular base. Anteriorly in the left upper lobe lingula there is a subpleural ground-glass nodule on 4:69 measuring 1 cm. In the left lower lobe just posterior to the crest of the diaphragm there is a 7 mm noncalcified nodule on 4:90. Medially in the right middle lobe, there is a second nodule also measuring 7 mm on 4:74. Rest of the lung fields are clear, with no other visible nodules. Musculoskeletal: No thoracic spinal compression injury is seen. There is intact anterior bridging enthesopathy in the mid and lower thirds of the thoracic spine. There are multilevel healed fracture deformities of the left rib cage. No displaced acute rib fracture seen. Generalized osteopenia. The chest wall is unremarkable. CT ABDOMEN PELVIS FINDINGS Hepatobiliary: There is a calcified granuloma in the right lobe, 1 in the left lobe. No other focal liver abnormality is seen without contrast. Gallbladder is absent, with no biliary dilatation. Pancreas: Partially atrophic and otherwise unremarkable without contrast. Spleen: There are calcified granulomas. Chronic capsular retraction posteriorly suggesting scarring. No other focal splenic abnormality. No splenomegaly. Adrenals/Urinary Tract: No adrenal or renal mass or hemorrhage is seen without contrast. Small cyst in the inferior pole of the left kidney are unchanged. Mild generalized adrenal hyperplasia appears similar. There are small scattered bilateral intrarenal caliceal stones and additional calcifications consistent with renovascular linear calcifications at both renal hila. No ureteral stones or hydronephrosis are seen. Bilateral perinephric stranding appears similar. Both kidneys are low normal in size. No bladder thickening is seen. Stomach/Bowel: Moderate thickened folds proximal to mid stomach. Mild fluid filling of some of the upper abdominal normal caliber small bowel seen possibly reflecting nonspecific enteritis. No small bowel  obstruction or inflammation is seen. The appendix is normal. There is mild to moderate retained stool in the ascending and transverse colon, left-sided diverticula most advanced in the sigmoid segment. No findings of acute diverticulitis are seen. The rectal wall is moderately thickened which could be due to proctitis, congestive or due to infiltrating disease. Further evaluation recommended. Vascular/Lymphatic: There is extensive aortoiliac and branch vessel atherosclerosis without AAA. No adenopathy is seen. Reproductive: No prostatomegaly. Other: Minimal presacral pelvic ascites and trace ascites distal left paracolic gutter. No free air, free hemorrhage or incarcerated hernias. Bilateral small inguinal fat hernias. Musculoskeletal: There is an acute superior endplate compression fracture of the L1 vertebral body. Loss of anterior height is 30-40%, loss of posterior height 20-25% with 4 mm posterosuperior cortical retropulsion and mild nondisplaced fragmentation along the inferior upper plate. No other compression fractures are seen. There is osteopenia, degenerative changes of the spine and advanced L4-5 facet hypertrophy with grade 1 degenerative L4-5 spondylolisthesis. Mild hip DJD. IMPRESSION: 1. Acute superior endplate compression fracture of the L1 vertebral body with 4 mm posterosuperior cortical retropulsion. 2. No other acute trauma related findings are seen in the chest, abdomen or pelvis. 3. Trace pleural effusions. 4. 1 cm subpleural ground-glass nodule in the left upper lobe lingula, and 7 mm noncalcified nodules in the right middle lobe and left lower lobe. Initial follow-up with CT at 6  months is recommended to confirm persistence of the ground-glass nodule and stability of the 2 solid nodules. If persistent, repeat CT is recommended every 2 years until 5 years of stability has been established. This recommendation follows the consensus statement: Guidelines for Management of Incidental Pulmonary  Nodules Detected on CT Images: From the Fleischner Society 2017; Radiology 2017; 284:228-243. 5. Aortic and coronary artery atherosclerosis, with dilatation to 4.2 cm in the aortic root and ascending segment. Recommend annual imaging followup by CTA or MRA. This recommendation follows 2010 ACCF/AHA/AATS/ACR/ASA/SCA/SCAI/SIR/STS/SVM Guidelines for the Diagnosis and Management of Patients with Thoracic Aortic Disease. Circulation. 2010; 121: Y706-C376. Aortic aneurysm NOS (ICD10-I71.9) 6. Prominent pulmonary trunk indicating arterial hypertension, with mild cardiomegaly and a small pericardial effusion. 7. Gastroenteritis, constipation and diverticulosis. 8. Nonobstructive nephrolithiasis. 9. Minimal pelvic ascites. 10. Moderately thickened rectal wall which could be due to proctitis, congestive or due to infiltrating disease. Further evaluation recommended. Aortic Atherosclerosis (ICD10-I70.0). Electronically Signed   By: Telford Nab M.D.   On: 01/27/2022 23:23   CT CERVICAL SPINE WO CONTRAST  Result Date: 01/27/2022 CLINICAL DATA:  Blunt polytrauma. Frequent falls past 5 weeks most recently today. Unable to get up after the most recent fall. EXAM: CT CERVICAL SPINE WITHOUT CONTRAST TECHNIQUE: Multidetector CT imaging of the cervical spine was performed without intravenous contrast. Multiplanar CT image reconstructions were also generated. RADIATION DOSE REDUCTION: This exam was performed according to the departmental dose-optimization program which includes automated exposure control, adjustment of the mA and/or kV according to patient size and/or use of iterative reconstruction technique. COMPARISON:  MRI cervical spine 07/19/2017. FINDINGS: Alignment: Straightened, with a minimal chronic discogenic retrolisthesis at C6-7, stable. Narrowing and osteophytes of the anterior atlantodental joint are again shown as well. Skull base and vertebrae: Osteopenia. No fracture is evident. No primary pathologic bone  process apart from osteopenia. Soft tissues and spinal canal: No prevertebral fluid or swelling. No visible canal hematoma. Congenitally short pedicles in this patient reduce the effective AP diameter of the thecal sac, including at C1-2 where there also hypertrophic changes of the C1-2 joint and 7 mm AP thecal sac stenosis with slight cord compression. Both proximal cervical ICAs are heavily calcified and there are likely flow-limiting stenoses of both. There are calcifications in both distal vertebral arteries. There is no laryngeal mass. No thyroid nodule. Disc levels: The discs are completely collapsed from C2-3 through C6-7, with chronic fusion across C4-5 vertebral bodies, and bidirectional osteophytes. The C2-3 and C7-T1 discs are normal in heights. Posterior disc osteophyte complexes variably narrow the thecal sac but especially so C5-6 and C6-7 where there is spondylotic cord compression eccentric to the right at both levels with more significant spinal canal stenosis at these levels. There is partial effacement of the ventral CSF at C3-4 and C4-5 but without spondylotic cord compression. Facet joint and uncinate hypertrophy is seen at all levels. Resulting foraminal stenosis is severe on the left and moderate on the right at C2-3, severe on the left and moderate to severe on the right at C3-4, bilaterally moderate to severe at C4-5, bilaterally severe at C5-6 and bilaterally moderate to severe at C6-7. Upper chest: Negative. Other: None. IMPRESSION: 1. Osteopenia and degenerative change without evidence of fractures. 2. Congenitally short pedicles and degenerative disc changes with multilevel spondylotic cord encroachment, greatest at C5-6 and C6-7. 3. Straightened lordosis with chronic discogenic retrolisthesis C6-7. 4. 7 mm AP thecal sac stenosis at C1-2 with slight cord compression. 5. Both proximal cervical ICAs are heavily  calcified and there are likely flow-limiting stenoses of both. Follow-up as  indicated. Electronically Signed   By: Telford Nab M.D.   On: 01/27/2022 22:46   CT HEAD WO CONTRAST  Result Date: 01/27/2022 CLINICAL DATA:  Head trauma EXAM: CT HEAD WITHOUT CONTRAST TECHNIQUE: Contiguous axial images were obtained from the base of the skull through the vertex without intravenous contrast. RADIATION DOSE REDUCTION: This exam was performed according to the departmental dose-optimization program which includes automated exposure control, adjustment of the mA and/or kV according to patient size and/or use of iterative reconstruction technique. COMPARISON:  None Available. FINDINGS: Brain: No evidence of acute infarction, hemorrhage, hydrocephalus, extra-axial collection or mass lesion/mass effect. There is mild diffuse atrophy and mild periventricular white matter hypodensity, likely chronic small vessel ischemic change. Vascular: Atherosclerotic calcifications are present within the cavernous internal carotid arteries. Skull: Normal. Negative for fracture or focal lesion. Sinuses/Orbits: No acute finding. Other: None. IMPRESSION: 1. No acute intracranial process. 2. Mild diffuse atrophy and mild chronic small vessel ischemic change. Electronically Signed   By: Ronney Asters M.D.   On: 01/27/2022 22:32   DG FEMUR, MIN 2 VIEWS RIGHT  Result Date: 01/27/2022 CLINICAL DATA:  Fall and pain in the right lower extremity. EXAM: RIGHT FEMUR 2 VIEWS COMPARISON:  None Available. FINDINGS: No acute fracture or dislocation. The bones are osteopenic. Severe arthritic changes of the right knee. Atherosclerotic calcification of the right lower extremity vasculature. The soft tissues are unremarkable. IMPRESSION: 1. No acute fracture or dislocation. 2. Severe arthritic changes of the right knee. Electronically Signed   By: Anner Crete M.D.   On: 01/27/2022 22:25    Microbiology: Results for orders placed or performed during the hospital encounter of 02/21/18  Urine culture     Status: None    Collection Time: 02/21/18 10:00 AM   Specimen: Urine, Catheterized  Result Value Ref Range Status   Specimen Description   Final    URINE, CATHETERIZED Performed at [facility], [address], [place], [place] [address]    Special Requests   Final    Normal Performed at [facility], [address] [place], [place] [address]    Culture   Final    NO GROWTH Performed at [facility], [place] [address], [place], [place] [address]    Report Status 02/23/2018 FINAL  Final    Labs: CBC: Recent Labs  Lab 01/31/22 0316 02/01/22 0122 02/02/22 0142  WBC 7.7 9.3 9.4  HGB 12.3* 12.5* 12.1*  HCT 36.7* 38.0* 38.6*  MCV 92.2 93.6 94.6  PLT 182 205 329   Basic Metabolic Panel: Recent Labs  Lab 02/01/22 0122 02/02/22 0142 02/03/22 0812 02/04/22 0538 02/05/22 0554  NA 147* 141 142 143 138  K 4.1 3.4* 3.5 3.4* 4.2  CL 118* 111 107 107 103  CO2 19* 21* '23 25 23  '$ GLUCOSE 99 135* 91 108* 102*  BUN 33* 30* '21 20 20  '$ CREATININE 1.46* 1.36* 1.31* 1.30* 1.32*  CALCIUM 8.8* 8.6* 8.8* 8.8* 8.8*  MG 1.7 1.8 1.9 1.4* 2.1   Liver Function Tests: Recent Labs  Lab 01/31/22 0316 02/02/22 0142 02/03/22 0812 02/04/22 0538 02/05/22 0554  AST 16 14* '15 23 20  '$ ALT '11 12 13 16 16  '$ ALKPHOS 53 59 61 60 66  BILITOT 0.8 0.5 0.6 0.7 0.8  PROT 5.3* 5.0* 5.4* 5.1* 5.5*  ALBUMIN 2.5* 2.3* 2.4* 2.2* 2.4*   CBG: Recent Labs  Lab 02/04/22 2152 02/05/22 0813  02/05/22 1204 02/05/22 1610 02/05/22 2119  GLUCAP 139* 114* 130* 171* 164*    Discharge time spent: greater than 30 minutes.  Signed: Patrecia Pour, MD [facility] 02/06/2022

## 2022-02-06 NOTE — Progress Notes (Signed)
Mobility Specialist - Progress Note   02/06/22 1141  Mobility  Activity Transferred from chair to bed  Level of Assistance +2 (takes two people)  Assistive Device Front wheel walker  Activity Response Tolerated well  Mobility Referral Yes  $Mobility charge 1 Mobility   Pt received in chair and agreeable. Pt expressed he had a BM while in chair. Pt was MinA +2 to stand from chair. MaxA for peri care. Pt was able to pivot to bed once cleaned. Pt was left in bed with all needs met.    [name] [name]  Mobility Specialist Please contact via Solicitor or Rehab office at [phone]

## 2022-02-10 DIAGNOSIS — S32010A Wedge compression fracture of first lumbar vertebra, initial encounter for closed fracture: Secondary | ICD-10-CM | POA: Diagnosis not present

## 2022-02-10 DIAGNOSIS — N179 Acute kidney failure, unspecified: Secondary | ICD-10-CM | POA: Diagnosis not present

## 2022-02-10 DIAGNOSIS — K219 Gastro-esophageal reflux disease without esophagitis: Secondary | ICD-10-CM | POA: Diagnosis not present

## 2022-02-10 DIAGNOSIS — E876 Hypokalemia: Secondary | ICD-10-CM | POA: Diagnosis not present

## 2022-02-10 DIAGNOSIS — I441 Atrioventricular block, second degree: Secondary | ICD-10-CM | POA: Diagnosis not present

## 2022-02-10 DIAGNOSIS — I4892 Unspecified atrial flutter: Secondary | ICD-10-CM | POA: Diagnosis not present

## 2022-02-10 DIAGNOSIS — G9341 Metabolic encephalopathy: Secondary | ICD-10-CM | POA: Diagnosis not present

## 2022-02-10 DIAGNOSIS — D508 Other iron deficiency anemias: Secondary | ICD-10-CM | POA: Diagnosis not present

## 2022-02-13 DIAGNOSIS — I441 Atrioventricular block, second degree: Secondary | ICD-10-CM | POA: Diagnosis not present

## 2022-02-13 DIAGNOSIS — I4892 Unspecified atrial flutter: Secondary | ICD-10-CM | POA: Diagnosis not present

## 2022-02-13 DIAGNOSIS — I6521 Occlusion and stenosis of right carotid artery: Secondary | ICD-10-CM | POA: Diagnosis not present

## 2022-02-13 DIAGNOSIS — N189 Chronic kidney disease, unspecified: Secondary | ICD-10-CM | POA: Diagnosis not present

## 2022-02-13 DIAGNOSIS — S32010A Wedge compression fracture of first lumbar vertebra, initial encounter for closed fracture: Secondary | ICD-10-CM | POA: Diagnosis not present

## 2022-02-13 DIAGNOSIS — K219 Gastro-esophageal reflux disease without esophagitis: Secondary | ICD-10-CM | POA: Diagnosis not present

## 2022-02-13 DIAGNOSIS — N179 Acute kidney failure, unspecified: Secondary | ICD-10-CM | POA: Diagnosis not present

## 2022-02-13 DIAGNOSIS — K529 Noninfective gastroenteritis and colitis, unspecified: Secondary | ICD-10-CM | POA: Diagnosis not present

## 2022-02-13 DIAGNOSIS — G9341 Metabolic encephalopathy: Secondary | ICD-10-CM | POA: Diagnosis not present

## 2022-02-13 DIAGNOSIS — D508 Other iron deficiency anemias: Secondary | ICD-10-CM | POA: Diagnosis not present

## 2022-02-19 DIAGNOSIS — I2510 Atherosclerotic heart disease of native coronary artery without angina pectoris: Secondary | ICD-10-CM | POA: Diagnosis not present

## 2022-02-19 DIAGNOSIS — K219 Gastro-esophageal reflux disease without esophagitis: Secondary | ICD-10-CM | POA: Diagnosis not present

## 2022-02-19 DIAGNOSIS — E119 Type 2 diabetes mellitus without complications: Secondary | ICD-10-CM | POA: Diagnosis not present

## 2022-02-19 DIAGNOSIS — E876 Hypokalemia: Secondary | ICD-10-CM | POA: Diagnosis not present

## 2022-02-19 DIAGNOSIS — S32010A Wedge compression fracture of first lumbar vertebra, initial encounter for closed fracture: Secondary | ICD-10-CM | POA: Diagnosis not present

## 2022-02-19 DIAGNOSIS — N189 Chronic kidney disease, unspecified: Secondary | ICD-10-CM | POA: Diagnosis not present

## 2022-02-19 DIAGNOSIS — K529 Noninfective gastroenteritis and colitis, unspecified: Secondary | ICD-10-CM | POA: Diagnosis not present

## 2022-02-19 DIAGNOSIS — I4892 Unspecified atrial flutter: Secondary | ICD-10-CM | POA: Diagnosis not present

## 2022-02-19 DIAGNOSIS — N179 Acute kidney failure, unspecified: Secondary | ICD-10-CM | POA: Diagnosis not present

## 2022-02-26 DIAGNOSIS — D508 Other iron deficiency anemias: Secondary | ICD-10-CM | POA: Diagnosis not present

## 2022-02-26 DIAGNOSIS — F32A Depression, unspecified: Secondary | ICD-10-CM | POA: Diagnosis not present

## 2022-02-26 DIAGNOSIS — K219 Gastro-esophageal reflux disease without esophagitis: Secondary | ICD-10-CM | POA: Diagnosis not present

## 2022-02-26 DIAGNOSIS — S32010A Wedge compression fracture of first lumbar vertebra, initial encounter for closed fracture: Secondary | ICD-10-CM | POA: Diagnosis not present

## 2022-02-26 DIAGNOSIS — I441 Atrioventricular block, second degree: Secondary | ICD-10-CM | POA: Diagnosis not present

## 2022-02-26 DIAGNOSIS — I4892 Unspecified atrial flutter: Secondary | ICD-10-CM | POA: Diagnosis not present

## 2022-02-26 DIAGNOSIS — R059 Cough, unspecified: Secondary | ICD-10-CM | POA: Diagnosis not present

## 2022-02-26 DIAGNOSIS — E876 Hypokalemia: Secondary | ICD-10-CM | POA: Diagnosis not present

## 2022-03-05 DIAGNOSIS — K219 Gastro-esophageal reflux disease without esophagitis: Secondary | ICD-10-CM | POA: Diagnosis not present

## 2022-03-05 DIAGNOSIS — J189 Pneumonia, unspecified organism: Secondary | ICD-10-CM | POA: Diagnosis not present

## 2022-03-05 DIAGNOSIS — D508 Other iron deficiency anemias: Secondary | ICD-10-CM | POA: Diagnosis not present

## 2022-03-05 DIAGNOSIS — M25461 Effusion, right knee: Secondary | ICD-10-CM | POA: Diagnosis not present

## 2022-03-05 DIAGNOSIS — I441 Atrioventricular block, second degree: Secondary | ICD-10-CM | POA: Diagnosis not present

## 2022-03-05 DIAGNOSIS — I4892 Unspecified atrial flutter: Secondary | ICD-10-CM | POA: Diagnosis not present

## 2022-03-05 DIAGNOSIS — I10 Essential (primary) hypertension: Secondary | ICD-10-CM | POA: Diagnosis not present

## 2022-03-05 DIAGNOSIS — B338 Other specified viral diseases: Secondary | ICD-10-CM | POA: Diagnosis not present

## 2022-03-10 DIAGNOSIS — I441 Atrioventricular block, second degree: Secondary | ICD-10-CM | POA: Diagnosis not present

## 2022-03-10 DIAGNOSIS — K219 Gastro-esophageal reflux disease without esophagitis: Secondary | ICD-10-CM | POA: Diagnosis not present

## 2022-03-10 DIAGNOSIS — S32010A Wedge compression fracture of first lumbar vertebra, initial encounter for closed fracture: Secondary | ICD-10-CM | POA: Diagnosis not present

## 2022-03-10 DIAGNOSIS — B352 Tinea manuum: Secondary | ICD-10-CM | POA: Diagnosis not present

## 2022-03-10 DIAGNOSIS — I2510 Atherosclerotic heart disease of native coronary artery without angina pectoris: Secondary | ICD-10-CM | POA: Diagnosis not present

## 2022-03-10 DIAGNOSIS — I10 Essential (primary) hypertension: Secondary | ICD-10-CM | POA: Diagnosis not present

## 2022-03-10 DIAGNOSIS — F32A Depression, unspecified: Secondary | ICD-10-CM | POA: Diagnosis not present

## 2022-03-10 DIAGNOSIS — N189 Chronic kidney disease, unspecified: Secondary | ICD-10-CM | POA: Diagnosis not present

## 2022-03-10 DIAGNOSIS — E119 Type 2 diabetes mellitus without complications: Secondary | ICD-10-CM | POA: Diagnosis not present

## 2022-03-10 DIAGNOSIS — J189 Pneumonia, unspecified organism: Secondary | ICD-10-CM | POA: Diagnosis not present

## 2022-03-10 DIAGNOSIS — I4892 Unspecified atrial flutter: Secondary | ICD-10-CM | POA: Diagnosis not present

## 2022-03-11 DIAGNOSIS — M5451 Vertebrogenic low back pain: Secondary | ICD-10-CM | POA: Diagnosis not present

## 2022-03-13 DIAGNOSIS — S32010A Wedge compression fracture of first lumbar vertebra, initial encounter for closed fracture: Secondary | ICD-10-CM | POA: Diagnosis not present

## 2022-03-13 DIAGNOSIS — B352 Tinea manuum: Secondary | ICD-10-CM | POA: Diagnosis not present

## 2022-03-13 DIAGNOSIS — I2510 Atherosclerotic heart disease of native coronary artery without angina pectoris: Secondary | ICD-10-CM | POA: Diagnosis not present

## 2022-03-13 DIAGNOSIS — K529 Noninfective gastroenteritis and colitis, unspecified: Secondary | ICD-10-CM | POA: Diagnosis not present

## 2022-03-13 DIAGNOSIS — F32A Depression, unspecified: Secondary | ICD-10-CM | POA: Diagnosis not present

## 2022-03-13 DIAGNOSIS — B338 Other specified viral diseases: Secondary | ICD-10-CM | POA: Diagnosis not present

## 2022-03-13 DIAGNOSIS — I10 Essential (primary) hypertension: Secondary | ICD-10-CM | POA: Diagnosis not present

## 2022-03-13 DIAGNOSIS — E119 Type 2 diabetes mellitus without complications: Secondary | ICD-10-CM | POA: Diagnosis not present

## 2022-03-13 DIAGNOSIS — J189 Pneumonia, unspecified organism: Secondary | ICD-10-CM | POA: Diagnosis not present

## 2022-03-13 DIAGNOSIS — D508 Other iron deficiency anemias: Secondary | ICD-10-CM | POA: Diagnosis not present

## 2022-03-13 DIAGNOSIS — K219 Gastro-esophageal reflux disease without esophagitis: Secondary | ICD-10-CM | POA: Diagnosis not present

## 2022-03-31 DIAGNOSIS — I10 Essential (primary) hypertension: Secondary | ICD-10-CM | POA: Diagnosis not present

## 2022-03-31 DIAGNOSIS — J302 Other seasonal allergic rhinitis: Secondary | ICD-10-CM | POA: Diagnosis not present

## 2022-03-31 DIAGNOSIS — N189 Chronic kidney disease, unspecified: Secondary | ICD-10-CM | POA: Diagnosis not present

## 2022-03-31 DIAGNOSIS — I2510 Atherosclerotic heart disease of native coronary artery without angina pectoris: Secondary | ICD-10-CM | POA: Diagnosis not present

## 2022-03-31 DIAGNOSIS — B352 Tinea manuum: Secondary | ICD-10-CM | POA: Diagnosis not present

## 2022-03-31 DIAGNOSIS — K529 Noninfective gastroenteritis and colitis, unspecified: Secondary | ICD-10-CM | POA: Diagnosis not present

## 2022-03-31 DIAGNOSIS — D508 Other iron deficiency anemias: Secondary | ICD-10-CM | POA: Diagnosis not present

## 2022-03-31 DIAGNOSIS — K219 Gastro-esophageal reflux disease without esophagitis: Secondary | ICD-10-CM | POA: Diagnosis not present

## 2022-03-31 DIAGNOSIS — I4892 Unspecified atrial flutter: Secondary | ICD-10-CM | POA: Diagnosis not present

## 2022-03-31 DIAGNOSIS — E119 Type 2 diabetes mellitus without complications: Secondary | ICD-10-CM | POA: Diagnosis not present

## 2022-03-31 DIAGNOSIS — S32010A Wedge compression fracture of first lumbar vertebra, initial encounter for closed fracture: Secondary | ICD-10-CM | POA: Diagnosis not present

## 2022-03-31 DIAGNOSIS — I441 Atrioventricular block, second degree: Secondary | ICD-10-CM | POA: Diagnosis not present

## 2022-04-14 DIAGNOSIS — I6521 Occlusion and stenosis of right carotid artery: Secondary | ICD-10-CM | POA: Diagnosis not present

## 2022-04-14 DIAGNOSIS — I2510 Atherosclerotic heart disease of native coronary artery without angina pectoris: Secondary | ICD-10-CM | POA: Diagnosis not present

## 2022-04-14 DIAGNOSIS — J302 Other seasonal allergic rhinitis: Secondary | ICD-10-CM | POA: Diagnosis not present

## 2022-04-14 DIAGNOSIS — N189 Chronic kidney disease, unspecified: Secondary | ICD-10-CM | POA: Diagnosis not present

## 2022-04-14 DIAGNOSIS — D508 Other iron deficiency anemias: Secondary | ICD-10-CM | POA: Diagnosis not present

## 2022-04-14 DIAGNOSIS — R059 Cough, unspecified: Secondary | ICD-10-CM | POA: Diagnosis not present

## 2022-04-14 DIAGNOSIS — J189 Pneumonia, unspecified organism: Secondary | ICD-10-CM | POA: Diagnosis not present

## 2022-04-14 DIAGNOSIS — I441 Atrioventricular block, second degree: Secondary | ICD-10-CM | POA: Diagnosis not present

## 2022-04-14 DIAGNOSIS — K529 Noninfective gastroenteritis and colitis, unspecified: Secondary | ICD-10-CM | POA: Diagnosis not present

## 2022-04-14 DIAGNOSIS — E119 Type 2 diabetes mellitus without complications: Secondary | ICD-10-CM | POA: Diagnosis not present

## 2022-04-14 DIAGNOSIS — I4892 Unspecified atrial flutter: Secondary | ICD-10-CM | POA: Diagnosis not present

## 2022-04-14 DIAGNOSIS — K219 Gastro-esophageal reflux disease without esophagitis: Secondary | ICD-10-CM | POA: Diagnosis not present

## 2022-04-14 DIAGNOSIS — I10 Essential (primary) hypertension: Secondary | ICD-10-CM | POA: Diagnosis not present

## 2022-04-15 DIAGNOSIS — U071 COVID-19: Secondary | ICD-10-CM | POA: Diagnosis not present

## 2022-04-16 ENCOUNTER — Other Ambulatory Visit: Payer: Self-pay

## 2022-04-16 ENCOUNTER — Emergency Department (HOSPITAL_COMMUNITY): Payer: Medicare Other

## 2022-04-16 ENCOUNTER — Encounter (HOSPITAL_COMMUNITY): Payer: Self-pay

## 2022-04-16 ENCOUNTER — Observation Stay (HOSPITAL_COMMUNITY)
Admission: EM | Admit: 2022-04-16 | Discharge: 2022-04-17 | Disposition: A | Discharge status: Skilled Nursing Facility | Payer: Medicare Other | Attending: Internal Medicine | Admitting: Internal Medicine

## 2022-04-16 DIAGNOSIS — J441 Chronic obstructive pulmonary disease with (acute) exacerbation: Secondary | ICD-10-CM | POA: Diagnosis not present

## 2022-04-16 DIAGNOSIS — Z85828 Personal history of other malignant neoplasm of skin: Secondary | ICD-10-CM | POA: Insufficient documentation

## 2022-04-16 DIAGNOSIS — Z7902 Long term (current) use of antithrombotics/antiplatelets: Secondary | ICD-10-CM | POA: Diagnosis not present

## 2022-04-16 DIAGNOSIS — I10 Essential (primary) hypertension: Secondary | ICD-10-CM | POA: Diagnosis not present

## 2022-04-16 DIAGNOSIS — I129 Hypertensive chronic kidney disease with stage 1 through stage 4 chronic kidney disease, or unspecified chronic kidney disease: Secondary | ICD-10-CM | POA: Diagnosis not present

## 2022-04-16 DIAGNOSIS — I2510 Atherosclerotic heart disease of native coronary artery without angina pectoris: Secondary | ICD-10-CM

## 2022-04-16 DIAGNOSIS — E1122 Type 2 diabetes mellitus with diabetic chronic kidney disease: Secondary | ICD-10-CM | POA: Diagnosis not present

## 2022-04-16 DIAGNOSIS — R0902 Hypoxemia: Secondary | ICD-10-CM | POA: Diagnosis not present

## 2022-04-16 DIAGNOSIS — F17200 Nicotine dependence, unspecified, uncomplicated: Secondary | ICD-10-CM | POA: Diagnosis present

## 2022-04-16 DIAGNOSIS — E785 Hyperlipidemia, unspecified: Secondary | ICD-10-CM | POA: Diagnosis not present

## 2022-04-16 DIAGNOSIS — E119 Type 2 diabetes mellitus without complications: Secondary | ICD-10-CM | POA: Diagnosis not present

## 2022-04-16 DIAGNOSIS — R062 Wheezing: Secondary | ICD-10-CM | POA: Diagnosis not present

## 2022-04-16 DIAGNOSIS — Z79899 Other long term (current) drug therapy: Secondary | ICD-10-CM | POA: Insufficient documentation

## 2022-04-16 DIAGNOSIS — K627 Radiation proctitis: Secondary | ICD-10-CM | POA: Diagnosis not present

## 2022-04-16 DIAGNOSIS — J9601 Acute respiratory failure with hypoxia: Secondary | ICD-10-CM

## 2022-04-16 DIAGNOSIS — R509 Fever, unspecified: Secondary | ICD-10-CM | POA: Diagnosis not present

## 2022-04-16 DIAGNOSIS — I441 Atrioventricular block, second degree: Secondary | ICD-10-CM | POA: Diagnosis present

## 2022-04-16 DIAGNOSIS — N1831 Chronic kidney disease, stage 3a: Secondary | ICD-10-CM | POA: Insufficient documentation

## 2022-04-16 DIAGNOSIS — E782 Mixed hyperlipidemia: Secondary | ICD-10-CM | POA: Diagnosis present

## 2022-04-16 DIAGNOSIS — F17210 Nicotine dependence, cigarettes, uncomplicated: Secondary | ICD-10-CM | POA: Diagnosis not present

## 2022-04-16 DIAGNOSIS — R0989 Other specified symptoms and signs involving the circulatory and respiratory systems: Secondary | ICD-10-CM | POA: Diagnosis present

## 2022-04-16 DIAGNOSIS — U071 COVID-19: Principal | ICD-10-CM | POA: Insufficient documentation

## 2022-04-16 DIAGNOSIS — R7989 Other specified abnormal findings of blood chemistry: Secondary | ICD-10-CM | POA: Diagnosis present

## 2022-04-16 DIAGNOSIS — R0602 Shortness of breath: Secondary | ICD-10-CM | POA: Diagnosis not present

## 2022-04-16 LAB — MAGNESIUM: Magnesium: 1.6 mg/dL — ABNORMAL LOW (ref 1.7–2.4)

## 2022-04-16 LAB — URINALYSIS, ROUTINE W REFLEX MICROSCOPIC
Bilirubin Urine: NEGATIVE
Glucose, UA: NEGATIVE mg/dL
Ketones, ur: NEGATIVE mg/dL
Leukocytes,Ua: NEGATIVE
Nitrite: NEGATIVE
Protein, ur: 100 mg/dL — AB
Specific Gravity, Urine: 1.014 (ref 1.005–1.030)
pH: 5 (ref 5.0–8.0)

## 2022-04-16 LAB — COMPREHENSIVE METABOLIC PANEL
ALT: 14 U/L (ref 0–44)
AST: 16 U/L (ref 15–41)
Albumin: 2.9 g/dL — ABNORMAL LOW (ref 3.5–5.0)
Alkaline Phosphatase: 57 U/L (ref 38–126)
Anion gap: 8 (ref 5–15)
BUN: 25 mg/dL — ABNORMAL HIGH (ref 8–23)
CO2: 21 mmol/L — ABNORMAL LOW (ref 22–32)
Calcium: 8.3 mg/dL — ABNORMAL LOW (ref 8.9–10.3)
Chloride: 108 mmol/L (ref 98–111)
Creatinine, Ser: 1.51 mg/dL — ABNORMAL HIGH (ref 0.61–1.24)
GFR, Estimated: 44 mL/min — ABNORMAL LOW (ref >60)
Glucose, Bld: 144 mg/dL — ABNORMAL HIGH (ref 70–99)
Potassium: 4 mmol/L (ref 3.5–5.1)
Sodium: 137 mmol/L (ref 135–145)
Total Bilirubin: 0.4 mg/dL (ref 0.3–1.2)
Total Protein: 6.2 g/dL — ABNORMAL LOW (ref 6.5–8.1)

## 2022-04-16 LAB — CBC WITH DIFFERENTIAL/PLATELET
Abs Immature Granulocytes: 0.05 10*3/uL (ref 0.00–0.07)
Basophils Absolute: 0 10*3/uL (ref 0.0–0.1)
Basophils Relative: 0 %
Eosinophils Absolute: 0.2 10*3/uL (ref 0.0–0.5)
Eosinophils Relative: 3 %
HCT: 33.7 % — ABNORMAL LOW (ref 39.0–52.0)
Hemoglobin: 10.5 g/dL — ABNORMAL LOW (ref 13.0–17.0)
Immature Granulocytes: 1 %
Lymphocytes Relative: 28 %
Lymphs Abs: 1.9 10*3/uL (ref 0.7–4.0)
MCH: 29.5 pg (ref 26.0–34.0)
MCHC: 31.2 g/dL (ref 30.0–36.0)
MCV: 94.7 fL (ref 80.0–100.0)
Monocytes Absolute: 0.5 10*3/uL (ref 0.1–1.0)
Monocytes Relative: 7 %
Neutro Abs: 4.1 10*3/uL (ref 1.7–7.7)
Neutrophils Relative %: 61 %
Platelets: 188 10*3/uL (ref 150–400)
RBC: 3.56 MIL/uL — ABNORMAL LOW (ref 4.22–5.81)
RDW: 13.9 % (ref 11.5–15.5)
WBC: 6.7 10*3/uL (ref 4.0–10.5)
nRBC: 0 % (ref 0.0–0.2)

## 2022-04-16 LAB — RESP PANEL BY RT-PCR (RSV, FLU A&B, COVID)  RVPGX2
Resp Syncytial Virus by PCR: NEGATIVE
SARS Coronavirus 2 by RT PCR: POSITIVE — AB

## 2022-04-16 MED ORDER — LACTATED RINGERS IV BOLUS: 500.0000 mL | Freq: Once | INTRAVENOUS | Status: DC

## 2022-04-16 MED ORDER — INSULIN ASPART 100 UNIT/ML IJ SOLN
0.0000 [IU] | INTRAMUSCULAR | Status: DC
  Filled 2022-04-16: qty 0.09

## 2022-04-16 MED ORDER — IPRATROPIUM-ALBUTEROL 0.5-2.5 (3) MG/3ML IN SOLN
3.0000 mL | Freq: Once | RESPIRATORY_TRACT | Status: AC
  Administered 2022-04-16: 3 mL via RESPIRATORY_TRACT
  Filled 2022-04-16: qty 3

## 2022-04-16 MED ORDER — MAGNESIUM OXIDE -MG SUPPLEMENT 400 (240 MG) MG PO TABS
200.0000 mg | ORAL_TABLET | Freq: Once | ORAL | Status: AC
  Administered 2022-04-16: 200 mg via ORAL
  Filled 2022-04-16: qty 1

## 2022-04-16 MED ORDER — INSULIN GLARGINE-YFGN 100 UNIT/ML ~~LOC~~ SOLN
5.0000 [IU] | Freq: Every day | SUBCUTANEOUS | Status: DC
  Administered 2022-04-17: 5 [IU] via SUBCUTANEOUS
  Filled 2022-04-16 (×3): qty 0.05

## 2022-04-16 NOTE — ED Triage Notes (Signed)
Patient BIB [facility] from [facility]. Has a hemoglobin of 8 and Covid positive. Oxygen saturation 94% room air at rest but 89% room air with ambulation.

## 2022-04-16 NOTE — H&P (Incomplete)
[name] [name]:[id number] DOB: [birth year] DOA: 04/16/2022     PCP: Chilton Greathouse, MD   Outpatient Specialists:  CARDS:   Dr. [name], MD  NEphrology: *  Dr. NEurology *   Dr. Pulmonary *  Dr.  Oncology * Dr. Sandria Manly* Dr.  Deboraha Sprang, [facility]) No care team member to display Urology Dr. *  Patient arrived to ER on 04/16/22 at 1744 Referred by Attending Loetta Rough, MD   Patient coming from:     From facility [facility]  Chief Complaint:   Chief Complaint  Patient presents with   Abnormal Labs    HPI: [name] [name] is a 89 y.o. male with medical history significant of Dm2, HTN, CKD3, HLD, OSA, anemia, AV block 2nd degree AV block, GERD, obesity, copd history of close compression fracture    Presented with   abnormal labs Patient arrives from [facility] and noted to have COVID-positive and hemoglobin down to 8 noted to desat down to 89% with ambulation Known history of anemia   Last week patient had what thought to be a pneumonia and was treated with antibiotics.  He has been doing much better since There was an outbreak of COVID at the facility and multiple patients were screened with COVID testing.  Also D-dimers were checked and his was elevated at this time his family was told that he may have a blood clot and that he needs to go to emergency department.  It was noted that his oxygen saturation did go down slightly into 89 with ambulation but otherwise has been in mid 90s which is stable for him eat he does has known history of COPD and has been smoking since he was 89 years old he only quit 3 months ago He actually feels completely fine and unclear why he is here.  He denies any chest pain Or shortness of breath or cough no fevers no chills Patient denies any bleeding  Denies significant ETOH intake   Does not smoke for past 3 months  Lab Results  Component Value Date   SARSCOV2NAA POSITIVE (A) 04/16/2022       Regarding pertinent Chronic problems:      Hyperlipidemia - on statins Crestor     HTN on  amlodipine,   Hx of Right Carotid stenosis  on Plavix and Statin did not wish to have any interventions  History of second-degree AV block cardiology seen him during last admission no indication for pacemaker felt to be at the time holding off of beta-blocker  ***chronic CHF diastolic/systolic/ combined - last echo*** Recent Results (from the past 81191 hour(s))  ECHOCARDIOGRAM COMPLETE   Collection Time: 01/28/22  1:28 PM  Result Value   Weight 2,640   Height 63   BP 171/73   AR max vel 2.78   AV Area VTI 2.72   AV Mean grad 3.0   AV Peak grad 5.1   Ao pk vel 1.13   AV Area mean vel 2.38   MV VTI 2.33   Area-P 1/2 2.75   S' Lateral 3.30   Est EF 65 - 70%   Narrative      ECHOCARDIOGRAM REPORT      IMPRESSIONS    1. Left ventricular ejection fraction, by estimation, is 65 to 70%. The left ventricle has normal function. The left ventricle has no regional wall motion abnormalities. There is mild concentric left ventricular hypertrophy. Left ventricular diastolic  parameters are indeterminate.  2. Right ventricular systolic  function is normal. The right ventricular size is normal. Tricuspid regurgitation signal is inadequate for assessing PA pressure.  3. Left atrial size was mild to moderately dilated.  4. The mitral valve is grossly normal, mildly calcified. Trivial mitral valve regurgitation.  5. The aortic valve is tricuspid. There is mild calcification of the aortic valve. Aortic valve regurgitation is not visualized. Aortic valve sclerosis is present, with no evidence of aortic valve stenosis. Aortic valve mean gradient measures 3.0 mmHg.  6. Aortic dilatation noted. There is mild dilatation of the aortic root, measuring 42 mm. There is mild dilatation of the ascending aorta, measuring 39 mm.  7. Unable to estimate CVP.             DM 2 -  Lab Results  Component Value Date   HGBA1C 5.5 01/27/2022   on insulin,        COPD - not  followed by pulmonology   not  on baseline oxygen       A. Fib -  - CHA2DS2 vas score  5    Not on anticoagulation secondary to Risk of Falls,           -  Rate control: Not on beta-blocker       CKD stage IIIa- baseline Cr 1.4 Estimated Creatinine Clearance: 31.8 mL/min (A) (by C-G formula based on SCr of 1.51 mg/dL (H)).  Lab Results  Component Value Date   CREATININE 1.51 (H) 04/16/2022   CREATININE 1.32 (H) 02/05/2022   CREATININE 1.30 (H) 02/04/2022   Chronic anemia - baseline hg Hemoglobin & Hematocrit  Recent Labs    02/01/22 0122 02/02/22 0142 04/16/22 1807  HGB 12.5* 12.1* 10.5*   Iron/TIBC/Ferritin/ %Sat    Component Value Date/Time   IRON 10 (L) 01/05/2011 0935   TIBC 246 01/05/2011 0935   FERRITIN 98 01/05/2011 0935   IRONPCTSAT 4 (L) 01/05/2011 0935     While in ER: Clinical Course as of 04/16/22 2118  Wed Apr 16, 2022  1903 Hemoglobin(!): 10.5 Down from 12 two months ago [HN]  1903 WBC: 6.7 No leukocytosis [HN]  1903 DG Chest Portable 1 View FINDINGS: The heart size and mediastinal contours are within normal limits. Both lungs are clear. The visualized skeletal structures are unremarkable.  IMPRESSION: No active disease.   [HN]  1909 Creatinine(!): 1.51 Mild increase, not an AKI [HN]  2012 SARS Coronavirus 2 by RT PCR(!): POSITIVE [HN]  2059 Magnesium(!): 1.6 repleted [HN]    Clinical Course User Index [HN] Loetta Rough, MD       Lab Orders         Resp panel by RT-PCR (RSV, Flu A&B, Covid) Anterior Nasal Swab         CBC with Differential         Comprehensive metabolic panel         Magnesium         Urinalysis, Routine w reflex microscopic -Urine, Clean Catch        CXR -  NON acute    CTA chest - ***nonacute, no PE, * no evidence of infiltrate  Following Medications were ordered in ER: Medications  magnesium oxide (MAG-OX) tablet 200 mg (has no administration in time range)  ipratropium-albuterol  (DUONEB) 0.5-2.5 (3) MG/3ML nebulizer solution 3 mL (3 mLs Nebulization Given 04/16/22 1830)       ED Triage Vitals  Enc Vitals Group     BP 04/16/22 1752 137/75  Pulse Rate 04/16/22 1752 69     Resp 04/16/22 1752 16     Temp 04/16/22 1752 97.6 F (36.4 C)     Temp Source 04/16/22 1752 Oral     SpO2 04/16/22 1752 94 %     Weight 04/16/22 1747 169 lb 12.1 oz (77 kg)     Height 04/16/22 1747 5\' 3"  (1.6 m)     Head Circumference --      Peak Flow --      Pain Score 04/16/22 1747 0     Pain Loc --      Pain Edu? --      Excl. in GC? --   TMAX(24)@     _________________________________________ Significant initial  Findings: Abnormal Labs Reviewed  RESP PANEL BY RT-PCR (RSV, FLU A&B, COVID)  RVPGX2 - Abnormal; Notable for the following components:      Result Value   SARS Coronavirus 2 by RT PCR POSITIVE (*)    All other components within normal limits  CBC WITH DIFFERENTIAL/PLATELET - Abnormal; Notable for the following components:   RBC 3.56 (*)    Hemoglobin 10.5 (*)    HCT 33.7 (*)    All other components within normal limits  COMPREHENSIVE METABOLIC PANEL - Abnormal; Notable for the following components:   CO2 21 (*)    Glucose, Bld 144 (*)    BUN 25 (*)    Creatinine, Ser 1.51 (*)    Calcium 8.3 (*)    Total Protein 6.2 (*)    Albumin 2.9 (*)    GFR, Estimated 44 (*)    All other components within normal limits  MAGNESIUM - Abnormal; Notable for the following components:   Magnesium 1.6 (*)    All other components within normal limits  URINALYSIS, ROUTINE W REFLEX MICROSCOPIC - Abnormal; Notable for the following components:   APPearance HAZY (*)    Hgb urine dipstick SMALL (*)    Protein, ur 100 (*)    Bacteria, UA RARE (*)    All other components within normal limits      ECG: Ordered Personally reviewed and interpreted by me showing: HR : 58 Rhythm: Sinus rhythm Atrial premature complex Short PR interval Consider right atrial enlargement Right bundle  branch block Artifact in lead(s) I III aVR aVL aVF V2 Similar to prior QTC 439     COVID-19 Labs  No results for input(s): "DDIMER", "FERRITIN", "LDH", "CRP" in the last 72 hours.  Lab Results  Component Value Date   SARSCOV2NAA POSITIVE (A) 04/16/2022    The recent clinical data is shown below. Vitals:   04/16/22 1747 04/16/22 1752 04/16/22 1753  BP:  137/75   Pulse:  69   Resp:  16   Temp:  97.6 F (36.4 C)   TempSrc:  Oral   SpO2:  94%   Weight: 77 kg  78 kg  Height: 5\' 3"  (1.6 m)  5\' 3"  (1.6 m)    WBC     Component Value Date/Time   WBC 6.7 04/16/2022 1807   LYMPHSABS 1.9 04/16/2022 1807   MONOABS 0.5 04/16/2022 1807   EOSABS 0.2 04/16/2022 1807   BASOSABS 0.0 04/16/2022 1807        Lactic Acid, Venous    Component Value Date/Time   LATICACIDVEN 1.3 01/27/2022 1947      Lactic Acid, Venous    Component Value Date/Time   LATICACIDVEN 1.3 01/27/2022 1947    Procalcitonin *** Ordered  UA *** no evidence of UTI  ***Pending ***not ordered   Urine analysis:    Component Value Date/Time   COLORURINE YELLOW 04/16/2022 2009   APPEARANCEUR HAZY (A) 04/16/2022 2009   LABSPEC 1.014 04/16/2022 2009   PHURINE 5.0 04/16/2022 2009   GLUCOSEU NEGATIVE 04/16/2022 2009   HGBUR SMALL (A) 04/16/2022 2009   BILIRUBINUR NEGATIVE 04/16/2022 2009   KETONESUR NEGATIVE 04/16/2022 2009   PROTEINUR 100 (A) 04/16/2022 2009   UROBILINOGEN 0.2 01/02/2011 1459   NITRITE NEGATIVE 04/16/2022 2009   LEUKOCYTESUR NEGATIVE 04/16/2022 2009    Results for orders placed or performed during the hospital encounter of 04/16/22  Resp panel by RT-PCR (RSV, Flu A&B, Covid) Anterior Nasal Swab     Status: Abnormal   Collection Time: 04/16/22  5:55 PM   Specimen: Anterior Nasal Swab  Result Value Ref Range Status   SARS Coronavirus 2 by RT PCR POSITIVE (A) NEGATIVE Final    Comment: (NOTE) SARS-CoV-2 target nucleic acids are DETECTED.  The SARS-CoV-2 RNA is generally  detectable in upper respiratory specimens during the acute phase of infection. Positive results are indicative of the presence of the identified virus, but do not rule out bacterial infection or co-infection with other pathogens not detected by the test. Clinical correlation with patient history and other diagnostic information is necessary to determine patient infection status. The expected result is Negative.  Fact Sheet for Patients: BloggerCourse.com  Fact Sheet for Healthcare Providers: SeriousBroker.it  This test is not yet approved or cleared by the [place] FDA and  has been authorized for detection and/or diagnosis of SARS-CoV-2 by FDA under an Emergency Use Authorization (EUA).  This EUA will remain in effect (meaning this test can be used) for the duration of  the COVID-19 declaration under Section 564(b)(1) of the A ct, 21 U.S.C. section 360bbb-3(b)(1), unless the authorization is terminated or revoked sooner.     Influenza A by PCR NEGATIVE NEGATIVE Final   Influenza B by PCR NEGATIVE NEGATIVE Final    Comment: (NOTE) The Xpert Xpress SARS-CoV-2/FLU/RSV plus assay is intended as an aid in the diagnosis of influenza from Nasopharyngeal swab specimens and should not be used as a sole basis for treatment. Nasal washings and aspirates are unacceptable for Xpert Xpress SARS-CoV-2/FLU/RSV testing.  Fact Sheet for Patients: BloggerCourse.com  Fact Sheet for Healthcare Providers: SeriousBroker.it  This test is not yet approved or cleared by the [place] FDA and has been authorized for detection and/or diagnosis of SARS-CoV-2 by FDA under an Emergency Use Authorization (EUA). This EUA will remain in effect (meaning this test can be used) for the duration of the COVID-19 declaration under Section 564(b)(1) of the Act, 21 U.S.C. section 360bbb-3(b)(1), unless the  authorization is terminated or revoked.     Resp Syncytial Virus by PCR NEGATIVE NEGATIVE Final    Comment: (NOTE) Fact Sheet for Patients: BloggerCourse.com  Fact Sheet for Healthcare Providers: SeriousBroker.it  This test is not yet approved or cleared by the [place] FDA and has been authorized for detection and/or diagnosis of SARS-CoV-2 by FDA under an Emergency Use Authorization (EUA). This EUA will remain in effect (meaning this test can be used) for the duration of the COVID-19 declaration under Section 564(b)(1) of the Act, 21 U.S.C. section 360bbb-3(b)(1), unless the authorization is terminated or revoked.  Performed at [facility], [address], [place], [place] [address]     ABX started Antibiotics Given (last 72 hours)     None  No results found for the last 90 days.     ________________________________________________________________  Arterial ***Venous  Blood Gas result:  pH *** pCO2 ***; pO2 ***;     %O2 Sat ***.  ABG No results found for: "PHART", "PCO2ART", "PO2ART", "HCO3", "TCO2", "ACIDBASEDEF", "O2SAT"     __________________________________________________________ Recent Labs  Lab 04/16/22 1807  NA 137  K 4.0  CO2 21*  GLUCOSE 144*  BUN 25*  CREATININE 1.51*  CALCIUM 8.3*  MG 1.6*    Cr  * stable,  Up from baseline see below Lab Results  Component Value Date   CREATININE 1.51 (H) 04/16/2022   CREATININE 1.32 (H) 02/05/2022   CREATININE 1.30 (H) 02/04/2022    Recent Labs  Lab 04/16/22 1807  AST 16  ALT 14  ALKPHOS 57  BILITOT 0.4  PROT 6.2*  ALBUMIN 2.9*   Lab Results  Component Value Date   CALCIUM 8.3 (L) 04/16/2022          Plt: Lab Results  Component Value Date   PLT 188 04/16/2022         Recent Labs  Lab 04/16/22 1807  WBC 6.7  NEUTROABS 4.1  HGB 10.5*  HCT 33.7*  MCV 94.7  PLT 188    HG/HCT * stable,  Down  *Up from baseline see below    Component Value Date/Time   HGB 10.5 (L) 04/16/2022 1807   HCT 33.7 (L) 04/16/2022 1807   MCV 94.7 04/16/2022 1807      No results for input(s): "LIPASE", "AMYLASE" in the last 168 hours. No results for input(s): "AMMONIA" in the last 168 hours.    .lab  _______________________________________________ Hospitalist was called for admission for *** COVID-19 ***  Wheezing on exhalation ***    The following Work up has been ordered so far:  Orders Placed This Encounter  Procedures   Resp panel by RT-PCR (RSV, Flu A&B, Covid) Anterior Nasal Swab   DG Chest Portable 1 View   CBC with Differential   Comprehensive metabolic panel   Magnesium   Urinalysis, Routine w reflex microscopic -Urine, Clean Catch   Cardiac monitoring   Check Pulse Oximetry while ambulating   Consult to hospitalist   Pulse oximetry, continuous   ED EKG   EKG 12-Lead     OTHER Significant initial  Findings:  labs showing:     DM  labs:  HbA1C: Recent Labs    01/27/22 1947  HGBA1C 5.5       CBG (last 3)  No results for input(s): "GLUCAP" in the last 72 hours.        Cultures:    Component Value Date/Time   SDES  02/21/2018 1000    URINE, CATHETERIZED Performed at [facility], [address], [place], [place] [address]    [facility]  02/21/2018 1000    Normal Performed at [facility], [address], [place], [place] [address]    CULT  02/21/2018 1000    NO GROWTH Performed at [facility], [address], [place], [place] [address]    REPTSTATUS 02/23/2018 FINAL 02/21/2018 1000     Radiological Exams on Admission: DG Chest Portable 1 View  Result Date: 04/16/2022 CLINICAL DATA:  COVID EXAM: PORTABLE CHEST 1 VIEW COMPARISON:  Chest x-ray dated December 27 /2012 FINDINGS: The heart size and mediastinal contours are within normal limits. Both lungs are clear. The visualized skeletal structures are unremarkable. IMPRESSION: No active  disease. Electronically Signed   By: Allegra Lai  M.D.   On: 04/16/2022 18:29   _______________________________________________________________________________________________________ Latest  Blood pressure 137/75, pulse 69, temperature 97.6 F (36.4 C), temperature source Oral, resp. rate 16, height 5\' 3"  (1.6 m), weight 78 kg, SpO2 94 %.   Vitals  labs and radiology finding personally reviewed  Review of Systems:    Pertinent positives include: ***  Constitutional:  No weight loss, night sweats, Fevers, chills, fatigue, weight loss  HEENT:  No headaches, Difficulty swallowing,Tooth/dental problems,Sore throat,  No sneezing, itching, ear ache, nasal congestion, post nasal drip,  Cardio-vascular:  No chest pain, Orthopnea, PND, anasarca, dizziness, palpitations.no Bilateral lower extremity swelling  GI:  No heartburn, indigestion, abdominal pain, nausea, vomiting, diarrhea, change in bowel habits, loss of appetite, melena, blood in stool, hematemesis Resp:  no shortness of breath at rest. No dyspnea on exertion, No excess mucus, no productive cough, No non-productive cough, No coughing up of blood.No change in color of mucus.No wheezing. Skin:  no rash or lesions. No jaundice GU:  no dysuria, change in color of urine, no urgency or frequency. No straining to urinate.  No flank pain.  Musculoskeletal:  No joint pain or no joint swelling. No decreased range of motion. No back pain.  Psych:  No change in mood or affect. No depression or anxiety. No memory loss.  Neuro: no localizing neurological complaints, no tingling, no weakness, no double vision, no gait abnormality, no slurred speech, no confusion  All systems reviewed and apart from HOPI all are negative _______________________________________________________________________________________________ Past Medical History:   Past Medical History:  Diagnosis Date   Bronchitis    Hx of   CAD (coronary artery disease)     (2005 LAD 30-40% stenosis  followed by 60-65% focal stenosis.  The second diagonal had 80%   stenosis.  The circumflex had 85-90% mid stenosis and was stented with a  Taxus stent.  The obtuse marginal had 20-25% stenosis and obtuse   marginal-2 had 40-50% stenosis.  The right coronary artery had 40-50%  proximal stenosis and mid 75% stenosis), well-preserved ejection  fraction (50-55% )   Carotid stenosis    History of colonic polyps    HTN (hypertension)    Myocardial infarction    Obesity    Prostate cancer 2001   Radiation    PUD (peptic ulcer disease)    Radiation proctitis    Right bundle branch block    Sigmoid diverticulosis    Skin cancer    Sleep apnea    Tobacco abuse    Type 2 diabetes mellitus       Past Surgical History:  Procedure Laterality Date   CARDIAC CATHETERIZATION  12/13/2003   ptca, stent x 2   CHOLECYSTECTOMY     COLONOSCOPY  05/11/2000   COLONOSCOPY W/ POLYPECTOMY     CYSTOSCOPY W/ RETROGRADES Bilateral 05/13/2018   Procedure: CYSTOSCOPY WITH RETROGRADE PYELOGRAM;  Surgeon: Marcine Matar, MD;  Location: [facility];  Service: Urology;  Laterality: Bilateral;   HERNIA REPAIR     right inguinal   Left Arm repair  1994   TRANSURETHRAL RESECTION OF BLADDER TUMOR WITH MITOMYCIN-C N/A 05/13/2018   Procedure: TRANSURETHRAL RESECTION OF BLADDER TUMOR WITH MITOMYCIN-C;  Surgeon: Marcine Matar, MD;  Location: [facility];  Service: Urology;  Laterality: N/A;  30 MNS    Social History:  Ambulatory *** independently cane, walker  wheelchair bound, bed bound     reports that he has been smoking cigarettes. He has a 60.00 pack-year smoking history. He has never  used smokeless tobacco. He reports that he does not drink alcohol and does not use drugs.     Family History: *** Family History  Problem Relation Age of Onset   Cancer Father    Coronary artery disease Father 7   Coronary artery disease Brother 70   Coronary artery disease Sister 54   Stroke Mother 78    ______________________________________________________________________________________________ Allergies: No Known Allergies   Prior to Admission medications   Medication Sig Start Date End Date Taking? Authorizing Provider  acetaminophen (TYLENOL) 325 MG tablet Take 650 mg by mouth every 6 (six) hours as needed.    [provider]  amLODipine (NORVASC) 5 MG tablet Take 5 mg by mouth daily.  11/12/17   [provider]  cholestyramine (QUESTRAN) 4 g packet Take 1 packet by mouth 2 (two) times daily. 11/08/21   [provider]  clopidogrel (PLAVIX) 75 MG tablet Take 1 tablet (75 mg total) by mouth daily. 01/31/14   [name], MD  diphenoxylate-atropine (LOMOTIL) 2.5-0.025 MG tablet Take 1 tablet by mouth 4 (four) times daily as needed for diarrhea or loose stools.    [provider]  escitalopram (LEXAPRO) 10 MG tablet Take 10 mg by mouth daily. 12/02/21   [provider]  ferrous sulfate 325 (65 FE) MG EC tablet Take 325 mg by mouth daily with breakfast.     [provider]  irbesartan-hydrochlorothiazide (AVALIDE) 300-12.5 MG per tablet Take 1 tablet by mouth daily. 01/31/14   [name], MD  LANTUS SOLOSTAR 100 UNIT/ML Solostar Pen Inject 7 Units into the skin at bedtime. 02/06/22   Tyrone Nine, MD  pantoprazole (PROTONIX) 40 MG tablet Take 1 tablet (40 mg total) by mouth daily. 02/06/22   Tyrone Nine, MD  rosuvastatin (CRESTOR) 5 MG tablet Take 5 mg by mouth daily.  11/12/17   [provider]  TRADJENTA 5 MG TABS tablet Take 5 mg by mouth daily.  11/24/17   [provider]    ___________________________________________________________________________________________________ Physical Exam:    04/16/2022    5:53 PM 04/16/2022    5:52 PM 04/16/2022    5:47 PM  Vitals with BMI  Height     Weight 171 lbs 15 oz  169 lbs 12 oz  BMI 30.47  30.08  Systolic  137   Diastolic  75   Pulse  69      1.  General:  in No ***Acute distress***increased work of breathing ***complaining of severe pain****agitated * Chronically ill *well *cachectic *toxic acutely ill -appearing 2. Psychological: Alert and *** Oriented 3. Head/ENT:   Moist *** Dry Mucous Membranes                          Head Non traumatic, neck supple                          Normal *** Poor Dentition 4. SKIN: normal *** decreased Skin turgor,  Skin clean Dry and intact no rash @  5. Heart: Regular rate and rhythm no*** Murmur, no Rub or gallop 6. Lungs: ***Clear to auscultation bilaterally, no wheezes or crackles   7. Abdomen: Soft, ***non-tender, Non distended *** obese ***bowel sounds present 8. Lower extremities: no clubbing, cyanosis, no ***edema 9. Neurologically Grossly intact, moving all 4 extremities equally *** strength 5 out of 5 in all 4 extremities cranial nerves II through XII intact 10. MSK:  Normal range of motion    Chart has been reviewed  ______________________________________________________________________________________________  Assessment/Plan  ***  Admitted for *** COVID-19 ***  Wheezing on exhalation ***    Present on Admission: **None**     No problem-specific Assessment & Plan notes found for this encounter.    Other plan as per orders.  DVT prophylaxis:  SCD *** Lovenox       Code Status:    Code Status: Prior FULL CODE *** DNR/DNI ***comfort care as per patient ***family  I had personally discussed CODE STATUS with patient and family* I had spent *min discussing goals of care and CODE STATUS ACP has been reviewed ***   Family Communication:   Family not at  Bedside  plan of care was discussed on the phone with *** Son, Daughter, Wife, Husband, Sister, Brother , father, mother  Diet    Disposition Plan:   *** likely will need placement for rehabilitation                          Back to current facility when stable                            To home once  workup is complete and patient is stable  ***Following barriers for discharge:                            Electrolytes corrected                               Anemia corrected                             Pain controlled with PO medications                               Afebrile, white count improving able to transition to PO antibiotics                             Will need to be able to tolerate PO                            Will likely need home health, home O2, set up                           Will need consultants to evaluate patient prior to discharge  ****EXPECT DC tomorrow       Consult Orders  (From admission, onward)           Start     Ordered   04/16/22 2105  Consult to hospitalist  Once       Provider:  (Not yet assigned)  Question Answer Comment  Place call to: [facility]   Reason for Consult Admit      04/16/22 2104                              ***Would benefit from PT/OT eval prior to DC  Ordered  Swallow eval - SLP ordered                   Diabetes care coordinator                   Transition of care consulted                   Nutrition    consulted                  Wound care  consulted                   Palliative care    consulted                   Behavioral health  consulted                    Consults called: ***     Admission status:  ED Disposition     None        Obs***  ***  inpatient     I Expect 2 midnight stay secondary to severity of patient's current illness need for inpatient interventions justified by the following: ***hemodynamic instability despite optimal treatment (tachycardia *hypotension * tachypnea *hypoxia, hypercapnia) * Severe lab/radiological/exam abnormalities including:     and extensive comorbidities including: *substance abuse  *Chronic pain *DM2  * CHF * CAD  * COPD/asthma *Morbid Obesity * CKD *dementia *liver disease *history of stroke with residual deficits *   malignancy, * sickle cell disease  History of amputation Chronic anticoagulation  That are currently affecting medical management.   I expect  patient to be hospitalized for 2 midnights requiring inpatient medical care.  Patient is at high risk for adverse outcome (such as loss of life or disability) if not treated.  Indication for inpatient stay as follows:  Severe change from baseline regarding mental status Hemodynamic instability despite maximal medical therapy,  ongoing suicidal ideations,  severe pain requiring acute inpatient management,  inability to maintain oral hydration   persistent chest pain despite medical management Need for operative/procedural  intervention New or worsening hypoxia   Need for IV antibiotics, IV fluids, IV rate controling medications, IV antihypertensives, IV pain medications, IV anticoagulation, need for biPAP    Level of care   *** tele  For 12H 24H     medical floor       progressive tele indefinitely please discontinue once patient no longer qualifies COVID-19 Labs    Lab Results  Component Value Date   SARSCOV2NAA POSITIVE (A) 04/16/2022     Precautions: admitted as *** Covid Negative  ***asymptomatic screening protocol****PUI *** covid positive No active isolations ***If Covid PCR is negative  - please DC precautions - would need additional investigation given very high risk for false native test result    Critical***  Patient is critically ill due to  hemodynamic instability * respiratory failure *severe sepsis* ongoing chest pain*  They are at high risk for life/limb threatening clinical deterioration requiring frequent reassessment and modifications of care.  Services provided include examination of the patient, review of relevant ancillary tests, prescription of lifesaving therapies, review of medications and prophylactic therapy.  Total critical care time excluding separately billable procedures: 60*  Minutes.    Zoiey Christy 04/16/2022, 9:18 PM ***  [facility]     after 2 AM please page floor coverage PA If 7AM-7PM, please contact the day team taking care  of the patient using [email or web address]

## 2022-04-16 NOTE — ED Notes (Addendum)
Ambulated pt, pt stated he felt fine. Pts SpO2 level was between 88-95%, and HR 60-67.

## 2022-04-16 NOTE — Subjective & Objective (Signed)
Patient arrives from [facility] and noted to have COVID-positive and hemoglobin down to 8 noted to desat down to 89% with ambulation Known history of anemia

## 2022-04-16 NOTE — ED Notes (Signed)
ED TO INPATIENT HANDOFF REPORT  ED Nurse Name and Phone #: Deon Pilling [phone]  S Name/Age/Gender [name] [name] 89 y.o. male Room/Bed: WA19/WA19  Code Status   Code Status: DNR  Home/SNF/Other Skilled nursing facility Patient oriented to: self, place, time, and situation Is this baseline? Yes   Triage Complete: Triage complete  Chief Complaint COPD with acute exacerbation [J44.1]  Triage Note Patient BIB [facility] from [facility]. Has a hemoglobin of 8 and Covid positive. Oxygen saturation 94% room air at rest but 89% room air with ambulation.   Allergies No Known Allergies  Level of Care/Admitting Diagnosis ED Disposition     ED Disposition  Admit   Condition  --   Comment  Hospital Area: [facility] [[id number]]  Level of Care: Telemetry [5]  Admit to tele based on following criteria: Other see comments  Comments: possible PE  May place patient in observation at [facility] or [facility] if equivalent level of care is available:: No  Covid Evaluation: Confirmed COVID Positive  Diagnosis: COPD with acute exacerbation [564332]  Admitting Physician: Therisa Doyne [[id number]]  Attending Physician: Therisa Doyne [[id number]]          B Medical/Surgery History Past Medical History:  Diagnosis Date   Bronchitis    Hx of   CAD (coronary artery disease)    (2005 LAD 30-40% stenosis  followed by 60-65% focal stenosis.  The second diagonal had 80%   stenosis.  The circumflex had 85-90% mid stenosis and was stented with a  Taxus stent.  The obtuse marginal had 20-25% stenosis and obtuse   marginal-2 had 40-50% stenosis.  The right coronary artery had 40-50%  proximal stenosis and mid 75% stenosis), well-preserved ejection  fraction (50-55% )   Carotid stenosis    History of colonic polyps    HTN (hypertension)    Myocardial infarction    Obesity    Prostate cancer 2001   Radiation    PUD (peptic ulcer disease)    Radiation proctitis    Right  bundle branch block    Sigmoid diverticulosis    Skin cancer    Sleep apnea    Tobacco abuse    Type 2 diabetes mellitus    Past Surgical History:  Procedure Laterality Date   CARDIAC CATHETERIZATION  12/13/2003   ptca, stent x 2   CHOLECYSTECTOMY     COLONOSCOPY  05/11/2000   COLONOSCOPY W/ POLYPECTOMY     CYSTOSCOPY W/ RETROGRADES Bilateral 05/13/2018   Procedure: CYSTOSCOPY WITH RETROGRADE PYELOGRAM;  Surgeon: Marcine Matar, MD;  Location: [facility];  Service: Urology;  Laterality: Bilateral;   HERNIA REPAIR     right inguinal   Left Arm repair  1994   TRANSURETHRAL RESECTION OF BLADDER TUMOR WITH MITOMYCIN-C N/A 05/13/2018   Procedure: TRANSURETHRAL RESECTION OF BLADDER TUMOR WITH MITOMYCIN-C;  Surgeon: Marcine Matar, MD;  Location: [facility];  Service: Urology;  Laterality: N/A;  30 MNS     A IV Location/Drains/Wounds Patient Lines/Drains/Airways Status     Active Line/Drains/Airways     Name Placement date Placement time Site Days   External Urinary Catheter 01/31/22  2200  --  75            Intake/Output Last 24 hours No intake or output data in the 24 hours ending 04/16/22 2241  Labs/Imaging Results for orders placed or performed during the hospital encounter of 04/16/22 (from the past 48 hour(s))  Resp panel by RT-PCR (RSV, Flu A&B, Covid)  Anterior Nasal Swab     Status: Abnormal   Collection Time: 04/16/22  5:55 PM   Specimen: Anterior Nasal Swab  Result Value Ref Range   SARS Coronavirus 2 by RT PCR POSITIVE (A) NEGATIVE    Comment: (NOTE) SARS-CoV-2 target nucleic acids are DETECTED.  The SARS-CoV-2 RNA is generally detectable in upper respiratory specimens during the acute phase of infection. Positive results are indicative of the presence of the identified virus, but do not rule out bacterial infection or co-infection with other pathogens not detected by the test. Clinical correlation with patient history and other diagnostic information is necessary  to determine patient infection status. The expected result is Negative.  Fact Sheet for Patients: BloggerCourse.com  Fact Sheet for Healthcare Providers: SeriousBroker.it  This test is not yet approved or cleared by the [place] FDA and  has been authorized for detection and/or diagnosis of SARS-CoV-2 by FDA under an Emergency Use Authorization (EUA).  This EUA will remain in effect (meaning this test can be used) for the duration of  the COVID-19 declaration under Section 564(b)(1) of the A ct, 21 U.S.C. section 360bbb-3(b)(1), unless the authorization is terminated or revoked sooner.     Influenza A by PCR NEGATIVE NEGATIVE   Influenza B by PCR NEGATIVE NEGATIVE    Comment: (NOTE) The Xpert Xpress SARS-CoV-2/FLU/RSV plus assay is intended as an aid in the diagnosis of influenza from Nasopharyngeal swab specimens and should not be used as a sole basis for treatment. Nasal washings and aspirates are unacceptable for Xpert Xpress SARS-CoV-2/FLU/RSV testing.  Fact Sheet for Patients: BloggerCourse.com  Fact Sheet for Healthcare Providers: SeriousBroker.it  This test is not yet approved or cleared by the [place] FDA and has been authorized for detection and/or diagnosis of SARS-CoV-2 by FDA under an Emergency Use Authorization (EUA). This EUA will remain in effect (meaning this test can be used) for the duration of the COVID-19 declaration under Section 564(b)(1) of the Act, 21 U.S.C. section 360bbb-3(b)(1), unless the authorization is terminated or revoked.     Resp Syncytial Virus by PCR NEGATIVE NEGATIVE    Comment: (NOTE) Fact Sheet for Patients: BloggerCourse.com  Fact Sheet for Healthcare Providers: SeriousBroker.it  This test is not yet approved or cleared by the [place] FDA and has been authorized  for detection and/or diagnosis of SARS-CoV-2 by FDA under an Emergency Use Authorization (EUA). This EUA will remain in effect (meaning this test can be used) for the duration of the COVID-19 declaration under Section 564(b)(1) of the Act, 21 U.S.C. section 360bbb-3(b)(1), unless the authorization is terminated or revoked.  Performed at [facility], [address], [place], [place] [address]   CBC with Differential     Status: Abnormal   Collection Time: 04/16/22  6:07 PM  Result Value Ref Range   WBC 6.7 4.0 - 10.5 K/uL   RBC 3.56 (L) 4.22 - 5.81 MIL/uL   Hemoglobin 10.5 (L) 13.0 - 17.0 g/dL   HCT 41.6 (L) 60.6 - 30.1 %   MCV 94.7 80.0 - 100.0 fL   MCH 29.5 26.0 - 34.0 pg   MCHC 31.2 30.0 - 36.0 g/dL   RDW 60.1 09.3 - 23.5 %   Platelets 188 150 - 400 K/uL   nRBC 0.0 0.0 - 0.2 %   Neutrophils Relative % 61 %   Neutro Abs 4.1 1.7 - 7.7 K/uL   Lymphocytes Relative 28 %   Lymphs Abs 1.9 0.7 - 4.0 K/uL  Monocytes Relative 7 %   Monocytes Absolute 0.5 0.1 - 1.0 K/uL   Eosinophils Relative 3 %   Eosinophils Absolute 0.2 0.0 - 0.5 K/uL   Basophils Relative 0 %   Basophils Absolute 0.0 0.0 - 0.1 K/uL   Immature Granulocytes 1 %   Abs Immature Granulocytes 0.05 0.00 - 0.07 K/uL    Comment: Performed at [facility], [address], [place], [place] [address]  Comprehensive metabolic panel     Status: Abnormal   Collection Time: 04/16/22  6:07 PM  Result Value Ref Range   Sodium 137 135 - 145 mmol/L   Potassium 4.0 3.5 - 5.1 mmol/L   Chloride 108 98 - 111 mmol/L   CO2 21 (L) 22 - 32 mmol/L   Glucose, Bld 144 (H) 70 - 99 mg/dL    Comment: Glucose reference range applies only to samples taken after fasting for at least 8 hours.   BUN 25 (H) 8 - 23 mg/dL   Creatinine, Ser 6.04 (H) 0.61 - 1.24 mg/dL   Calcium 8.3 (L) 8.9 - 10.3 mg/dL   Total Protein 6.2 (L) 6.5 - 8.1 g/dL   Albumin 2.9 (L) 3.5 - 5.0 g/dL   AST 16 15 - 41 U/L   ALT 14 0 - 44  U/L   Alkaline Phosphatase 57 38 - 126 U/L   Total Bilirubin 0.4 0.3 - 1.2 mg/dL   GFR, Estimated 44 (L) >60 mL/min    Comment: (NOTE) Calculated using the CKD-EPI Creatinine Equation (2021)    Anion gap 8 5 - 15    Comment: Performed at [facility], [address], [place], [place] [address]  Magnesium     Status: Abnormal   Collection Time: 04/16/22  6:07 PM  Result Value Ref Range   Magnesium 1.6 (L) 1.7 - 2.4 mg/dL    Comment: Performed at [facility], [address], [place], [place] [address]  Urinalysis, Routine w reflex microscopic -Urine, Clean Catch     Status: Abnormal   Collection Time: 04/16/22  8:09 PM  Result Value Ref Range   Color, Urine YELLOW YELLOW   APPearance HAZY (A) CLEAR   Specific Gravity, Urine 1.014 1.005 - 1.030   pH 5.0 5.0 - 8.0   Glucose, UA NEGATIVE NEGATIVE mg/dL   Hgb urine dipstick SMALL (A) NEGATIVE   Bilirubin Urine NEGATIVE NEGATIVE   Ketones, ur NEGATIVE NEGATIVE mg/dL   Protein, ur 782 (A) NEGATIVE mg/dL   Nitrite NEGATIVE NEGATIVE   Leukocytes,Ua NEGATIVE NEGATIVE   RBC / HPF 0-5 0 - 5 RBC/hpf   WBC, UA 0-5 0 - 5 WBC/hpf   Bacteria, UA RARE (A) NONE SEEN   Squamous Epithelial / HPF 0-5 0 - 5 /HPF   Mucus PRESENT    Granular Casts, UA PRESENT     Comment: Performed at [facility], [address], [place], [place] [address]   DG Chest Portable 1 View  Result Date: 04/16/2022 CLINICAL DATA:  COVID EXAM: PORTABLE CHEST 1 VIEW COMPARISON:  Chest x-ray dated December 27 /2012 FINDINGS: The heart size and mediastinal contours are within normal limits. Both lungs are clear. The visualized skeletal structures are unremarkable. IMPRESSION: No active disease. Electronically Signed   By: Allegra Lai M.D.   On: 04/16/2022 18:29    Pending Labs Unresulted Labs (From admission, onward)     Start     Ordered   04/17/22 0500  Prealbumin  Tomorrow  morning,   R        04/16/22 2223    04/16/22 2233  Lactic acid, plasma  STAT Now then every 3 hours,   R (with STAT occurrences)     Question:  Release to patient  Answer:  Immediate   04/16/22 2233   04/16/22 2224  CK  Add-on,   AD        04/16/22 2223   04/16/22 2224  Phosphorus  Add-on,   AD        04/16/22 2223   04/16/22 2224  Protime-INR  Once,   R       Question:  Release to patient  Answer:  Immediate   04/16/22 2223   04/16/22 2224  TSH  Add-on,   AD        04/16/22 2223   04/16/22 2224  Creatinine, urine, random  Once,   R        04/16/22 2223   04/16/22 2224  Osmolality  Add-on,   AD        04/16/22 2223   04/16/22 2224  Osmolality, urine  Once,   R        04/16/22 2223   04/16/22 2224  Sodium, urine, random  Once,   R        04/16/22 2223   04/16/22 2224  Blood gas, venous  ONCE - STAT,   R       Question:  Release to patient  Answer:  Immediate   04/16/22 2223   04/16/22 2224  Vitamin B12  (Anemia Panel (PNL))  Once,   R        04/16/22 2223   04/16/22 2224  Folate  (Anemia Panel (PNL))  Once,   R        04/16/22 2223   04/16/22 2224  Iron and TIBC  (Anemia Panel (PNL))  Once,   R        04/16/22 2223   04/16/22 2224  Ferritin  (Anemia Panel (PNL))  Once,   R        04/16/22 2223   04/16/22 2224  Reticulocytes  (Anemia Panel (PNL))  Once,   R        04/16/22 2223   04/16/22 2224  Procalcitonin  Once,   R       References:    Procalcitonin Lower Respiratory Tract Infection AND Sepsis Procalcitonin Algorithm   04/16/22 2223   Unscheduled  Occult blood card to lab, stool  As needed,   R      04/16/22 2223   Signed and Held  Magnesium  Tomorrow morning,   R        Signed and Held   Signed and Held  Phosphorus  Tomorrow morning,   R        Signed and Held   Signed and Held  Comprehensive metabolic panel  Tomorrow morning,   R       Question:  Release to patient  Answer:  Immediate   Signed and Held   Signed and Held  CBC  Tomorrow morning,   R       Question:  Release to patient  Answer:  Immediate    Signed and Held   Signed and Held  Expectorated Sputum Assessment w Gram Stain, Rflx to Resp Cult  (COPD / Pneumonia / Cellulitis / Lower Extremity Wound)  Once,   R        Signed and Held  Vitals/Pain Today's Vitals   04/16/22 1752 04/16/22 1753 04/16/22 2205 04/16/22 2210  BP: 137/75  (!) 136/93   Pulse: 69  62   Resp: 16  (!) 25   Temp: 97.6 F (36.4 C)   97.9 F (36.6 C)  TempSrc: Oral   Oral  SpO2: 94%  91%   Weight:  78 kg    Height:  5\' 3"  (1.6 m)    PainSc:  0-No pain      Isolation Precautions No active isolations  Medications Medications  lactated ringers bolus 500 mL (has no administration in time range)  insulin glargine-yfgn (SEMGLEE) injection 5 Units (has no administration in time range)  insulin aspart (novoLOG) injection 0-9 Units (has no administration in time range)  ipratropium-albuterol (DUONEB) 0.5-2.5 (3) MG/3ML nebulizer solution 3 mL (3 mLs Nebulization Given 04/16/22 1830)  magnesium oxide (MAG-OX) tablet 200 mg (200 mg Oral Given 04/16/22 2207)    Mobility walks with person assist     Focused Assessments    R Recommendations: See Admitting Provider Note  Report given to:   Additional Notes:

## 2022-04-16 NOTE — ED Provider Notes (Signed)
[place] EMERGENCY DEPARTMENT AT [facility] Provider Note   CSN: [id number] Arrival date & time: 04/16/22  1744     History  Chief Complaint  Patient presents with   Abnormal Labs    [name] [name] is a 89 y.o. male with T2DM, HTN, CKD stage III, HLD, OSA, anemia, GERD, second-degree AV block, L1 compression fracture, GERD, tobacco abuse, obesity who presents with abnormal labs, COVID+.   Patient BIB [facility] from [facility]. There was an outbreak of COVID at the facility and multiple patients were screened with COVID testing. Also D-dimers were checked and his was elevated at this time his family was told that he may have a blood clot and that he needs to go to emergency department. Reportedly also has a hemoglobin of 8 and Covid positive, had d-dimer tested and it was positive so was sent to ED. Many other residents of [facility] were sent for the same presentation. Oxygen saturation 94% room air at rest but 89% room air with ambulation. Patient does not feel SOB, has no CP, no leg swelling. Feels in his normal state of health, no fevers/chills, no CP, leg swelling.   HPI     Home Medications Prior to Admission medications   Medication Sig Start Date End Date Taking? Authorizing Provider  acetaminophen (TYLENOL) 325 MG tablet Take 650 mg by mouth every 6 (six) hours as needed (for pain).   Yes [provider]  albuterol (VENTOLIN HFA) 108 (90 Base) MCG/ACT inhaler Inhale 2 puffs into the lungs every 4 (four) hours as needed for wheezing or shortness of breath.   Yes [provider]  amLODipine (NORVASC) 5 MG tablet Take 5 mg by mouth daily.  11/12/17  Yes [provider]  ascorbic acid (VITAMIN C) 500 MG tablet Take 500 mg by mouth daily. 04/16/22 05/01/22 Yes [provider]  aspirin EC 325 MG tablet Take 325 mg by mouth daily. 04/16/22 05/01/22 Yes [provider]  B Complex Vitamins (VITAMIN B COMPLEX) TABS Take 1  tablet by mouth daily. 04/16/22 05/01/22 Yes [provider]  Cholecalciferol (VITAMIN D3) 50 MCG (2000 UT) TABS Take 2,000 Units by mouth daily. 04/16/22 05/01/22 Yes [provider]  cholestyramine (QUESTRAN) 4 g packet Take 1 packet by mouth 2 (two) times daily. 11/08/21  Yes [provider]  clopidogrel (PLAVIX) 75 MG tablet Take 1 tablet (75 mg total) by mouth daily. 01/31/14  Yes [name], MD  escitalopram (LEXAPRO) 10 MG tablet Take 10 mg by mouth daily. 12/02/21  Yes [provider]  ferrous sulfate 325 (65 FE) MG EC tablet Take 325 mg by mouth daily with breakfast.    Yes [provider]  irbesartan-hydrochlorothiazide (AVALIDE) 300-12.5 MG per tablet Take 1 tablet by mouth daily. 01/31/14  Yes [name], MD  LANTUS SOLOSTAR 100 UNIT/ML Solostar Pen Inject 7 Units into the skin at bedtime. 02/06/22  Yes Tyrone Nine, MD  loratadine (CLARITIN) 10 MG tablet Take 10 mg by mouth daily.   Yes [provider]  Magnesium 500 MG TABS Take 500 mg by mouth in the morning.   Yes [provider]  melatonin 5 MG TABS Take 5 mg by mouth at bedtime.   Yes [provider]  pantoprazole (PROTONIX) 40 MG tablet Take 1 tablet (40 mg total) by mouth daily. 02/06/22  Yes Tyrone Nine, MD  predniSONE (DELTASONE) 20 MG tablet Take 3 tablets once daily for 3 days followed by  2 tablets once daily for 3 days followed by 1 tablet once daily for 3 days and then stop 04/17/22  Yes Osvaldo ShipperKrishnan, Gokul, MD  rosuvastatin (CRESTOR) 5 MG tablet Take 5 mg by mouth at bedtime. 11/12/17  Yes [provider]  sitaGLIPtin (JANUVIA) 100 MG tablet Take 100 mg by mouth in the morning.   Yes [provider]  Zinc 50 MG TABS Take 50 mg by mouth daily. 04/16/22 05/01/22 Yes [provider]  diphenoxylate-atropine (LOMOTIL) 2.5-0.025 MG tablet Take 1 tablet by mouth 4 (four) times daily as needed for diarrhea or loose stools. 04/17/22    Osvaldo ShipperKrishnan, Gokul, MD      Allergies    Patient has no known allergies.    Review of Systems   Review of Systems Review of systems Negative for f/c.  A 10 point review of systems was performed and is negative unless otherwise reported in HPI.  Physical Exam Updated Vital Signs BP 129/61 (BP Location: Right Arm)   Pulse 69   Temp (!) 97.3 F (36.3 C) (Oral)   Resp 18   Ht 5\' 3"  (1.6 m)   Wt 78 kg   SpO2 99%   BMI 30.46 kg/m  Physical Exam General: Normal appearing elderly male, lying in bed.  HEENT: PERRLA, Sclera anicteric, MMM, trachea midline.  Cardiology: RRR, no murmurs/rubs/gallops. BL radial and DP pulses equal bilaterally.  Resp: Normal respiratory rate and effort. Mild expiratory wheezing, no rhonchi, crackles.  Abd: Soft, non-tender, non-distended. No rebound tenderness or guarding.  GU: Deferred. MSK: No peripheral edema or signs of trauma. Extremities without deformity or TTP. No cyanosis or clubbing. Skin: warm, dry. No rashes or lesions. Back: No CVA tenderness Neuro: A&Ox4, CNs II-XII grossly intact. MAEs. Sensation grossly intact.  Psych: Normal mood and affect.   ED Results / Procedures / Treatments   Labs (all labs ordered are listed, but only abnormal results are displayed) Labs Reviewed  RESP PANEL BY RT-PCR (RSV, FLU A&B, COVID)  RVPGX2 - Abnormal; Notable for the following components:      Result Value   SARS Coronavirus 2 by RT PCR POSITIVE (*)    All other components within normal limits  CBC WITH DIFFERENTIAL/PLATELET - Abnormal; Notable for the following components:   RBC 3.56 (*)    Hemoglobin 10.5 (*)    HCT 33.7 (*)    All other components within normal limits  COMPREHENSIVE METABOLIC PANEL - Abnormal; Notable for the following components:   CO2 21 (*)    Glucose, Bld 144 (*)    BUN 25 (*)    Creatinine, Ser 1.51 (*)    Calcium 8.3 (*)    Total Protein 6.2 (*)    Albumin 2.9 (*)    GFR, Estimated 44 (*)    All other components within  normal limits  MAGNESIUM - Abnormal; Notable for the following components:   Magnesium 1.6 (*)    All other components within normal limits  URINALYSIS, ROUTINE W REFLEX MICROSCOPIC - Abnormal; Notable for the following components:   APPearance HAZY (*)    Hgb urine dipstick SMALL (*)    Protein, ur 100 (*)    Bacteria, UA RARE (*)    All other components within normal limits    EKG EKG Interpretation  Date/Time:  Wednesday April 16 2022 18:10:04 EDT Ventricular Rate:  58 PR Interval:  64 QRS Duration: 163 QT Interval:  467 QTC Calculation: 459 R Axis:   -20 Text Interpretation: Sinus rhythm  Atrial premature complex Short PR interval Consider right atrial enlargement Right bundle branch block Artifact in lead(s) I III aVR aVL aVF V2 Similar to prior Confirmed by Vivi Barrack [phone]) on 04/16/2022 6:25:31 PM  Radiology   Procedures Procedures    Medications Ordered in ED Medications  ipratropium-albuterol (DUONEB) 0.5-2.5 (3) MG/3ML nebulizer solution 3 mL (3 mLs Nebulization Given 04/16/22 1830)  magnesium oxide (MAG-OX) tablet 200 mg (200 mg Oral Given 04/16/22 2207)    ED Course/ Medical Decision Making/ A&P                          Medical Decision Making Amount and/or Complexity of Data Reviewed Labs: ordered. Decision-making details documented in ED Course. Radiology: ordered. Decision-making details documented in ED Course.  Risk OTC drugs. Prescription drug management. Decision regarding hospitalization.     MDM:    Pt reportedly w/ elevated d-dimer and + covid test. Patient states he feels okay and doesn't have any SOB or CP. Cannot PERC out d/t age but will order CT PE. Patient here desatted into mid-80s with ambulation. He didn't complain of SOB at this time. Unclear if patient normally desaturates but he has mild wheezing at this time to indicate a COPD exacerbation. No PNA or pulm edema/pleural effusion on CXR. In s/o COPD/covid will consult w/  hospitalist. Will treat w/ steroids/inhaler. Patient may need to be DC'd w/ home oxygen. Hgb is 10.5, which is decreased from 2 months ago, but no reported melena/hematochezia, and is known to have chronic anemia.    Clinical Course as of 04/23/22 1745  Wed Apr 16, 2022  1903 Hemoglobin(!): 10.5 Down from 12 two months ago [HN]  1903 WBC: 6.7 No leukocytosis [HN]  1903 DG Chest Portable 1 View FINDINGS: The heart size and mediastinal contours are within normal limits. Both lungs are clear. The visualized skeletal structures are unremarkable.  IMPRESSION: No active disease.   [HN]  1909 Creatinine(!): 1.51 Mild increase, not an AKI [HN]  2012 SARS Coronavirus 2 by RT PCR(!): POSITIVE [HN]  2059 Magnesium(!): 1.6 repleted [HN]    Clinical Course User Index [HN] Loetta Rough, MD    Labs: I Ordered, and personally interpreted labs.  The pertinent results include:  those listed above  Imaging Studies ordered: I ordered imaging studies including CXR I independently visualized and interpreted imaging. I agree with the radiologist interpretation  Additional history obtained from EMS, chart review.    Social Determinants of Health: Lives in facility  Disposition:  Admit to hospitalist  Co morbidities that complicate the patient evaluation  Past Medical History:  Diagnosis Date   Bronchitis    Hx of   CAD (coronary artery disease)    (2005 LAD 30-40% stenosis  followed by 60-65% focal stenosis.  The second diagonal had 80%   stenosis.  The circumflex had 85-90% mid stenosis and was stented with a  Taxus stent.  The obtuse marginal had 20-25% stenosis and obtuse   marginal-2 had 40-50% stenosis.  The right coronary artery had 40-50%  proximal stenosis and mid 75% stenosis), well-preserved ejection  fraction (50-55% )   Carotid stenosis    History of colonic polyps    HTN (hypertension)    Myocardial infarction    Obesity    Prostate cancer 2001   Radiation    PUD  (peptic ulcer disease)    Radiation proctitis    Right bundle branch block    Sigmoid diverticulosis  Skin cancer    Sleep apnea    Tobacco abuse    Type 2 diabetes mellitus      Medicines Meds ordered this encounter  Medications   ipratropium-albuterol (DUONEB) 0.5-2.5 (3) MG/3ML nebulizer solution 3 mL   magnesium oxide (MAG-OX) tablet 200 mg   DISCONTD: lactated ringers bolus 500 mL   DISCONTD: ipratropium-albuterol (DUONEB) 0.5-2.5 (3) MG/3ML nebulizer solution 3 mL   DISCONTD: escitalopram (LEXAPRO) tablet 10 mg   0.9 %  sodium chloride infusion   DISCONTD: acetaminophen (TYLENOL) tablet 650 mg   DISCONTD: acetaminophen (TYLENOL) suppository 650 mg   DISCONTD: HYDROcodone-acetaminophen (NORCO/VICODIN) 5-325 MG per tablet 1-2 tablet   DISCONTD: ondansetron (ZOFRAN) tablet 4 mg   DISCONTD: ondansetron (ZOFRAN) injection 4 mg   azithromycin (ZITHROMAX) 500 mg in sodium chloride 0.9 % 250 mL IVPB    Order Specific Question:   Antibiotic Indication:    Answer:   Other Indication (list below)    Order Specific Question:   Other Indication:    Answer:   COPD exacerbation   DISCONTD: azithromycin (ZITHROMAX) tablet 500 mg   methylPREDNISolone sodium succinate (SOLU-MEDROL) 40 mg/mL injection 40 mg    IV methylprednisolone will be converted to either a q12h or q24h frequency with the same total daily dose (TDD).  Ordered Dose: 1 to 125 mg TDD; convert to: TDD q24h.  Ordered Dose: 126 to 250 mg TDD; convert to: TDD div q12h.  Ordered Dose: >250 mg TDD; DAW.   DISCONTD: predniSONE (DELTASONE) tablet 40 mg   DISCONTD: albuterol (PROVENTIL) (2.5 MG/3ML) 0.083% nebulizer solution 2.5 mg   DISCONTD: guaiFENesin (MUCINEX) 12 hr tablet 600 mg   DISCONTD: insulin glargine-yfgn (SEMGLEE) injection 5 Units   DISCONTD: insulin aspart (novoLOG) injection 0-9 Units    Order Specific Question:   Correction coverage:    Answer:   Sensitive (thin, NPO, renal)    Order Specific Question:   CBG  < 70:    Answer:   Implement Hypoglycemia Standing Orders and refer to Hypoglycemia Standing Orders sidebar report    Order Specific Question:   CBG 70 - 120:    Answer:   0 units    Order Specific Question:   CBG 121 - 150:    Answer:   1 unit    Order Specific Question:   CBG 151 - 200:    Answer:   2 units    Order Specific Question:   CBG 201 - 250:    Answer:   3 units    Order Specific Question:   CBG 251 - 300:    Answer:   5 units    Order Specific Question:   CBG 301 - 350:    Answer:   7 units    Order Specific Question:   CBG 351 - 400    Answer:   9 units    Order Specific Question:   CBG > 400    Answer:   call MD and obtain STAT lab verification   DISCONTD: cholestyramine (QUESTRAN) packet 1 packet   DISCONTD: clopidogrel (PLAVIX) tablet 75 mg   DISCONTD: rosuvastatin (CRESTOR) tablet 5 mg   DISCONTD: cholestyramine light (PREVALITE) packet 4 g   DISCONTD: insulin aspart (novoLOG) injection 0-9 Units    Order Specific Question:   Correction coverage:    Answer:   Sensitive (thin, NPO, renal)    Order Specific Question:   CBG < 70:    Answer:   Implement Hypoglycemia Standing Orders and  refer to Hypoglycemia Standing Orders sidebar report    Order Specific Question:   CBG 70 - 120:    Answer:   0 units    Order Specific Question:   CBG 121 - 150:    Answer:   1 unit    Order Specific Question:   CBG 151 - 200:    Answer:   2 units    Order Specific Question:   CBG 201 - 250:    Answer:   3 units    Order Specific Question:   CBG 251 - 300:    Answer:   5 units    Order Specific Question:   CBG 301 - 350:    Answer:   7 units    Order Specific Question:   CBG 351 - 400    Answer:   9 units    Order Specific Question:   CBG > 400    Answer:   call MD and obtain STAT lab verification   iohexol (OMNIPAQUE) 350 MG/ML injection 75 mL   DISCONTD: ipratropium-albuterol (DUONEB) 0.5-2.5 (3) MG/3ML nebulizer solution 3 mL   diphenoxylate-atropine (LOMOTIL) 2.5-0.025 MG  tablet    Sig: Take 1 tablet by mouth 4 (four) times daily as needed for diarrhea or loose stools.    Dispense:  15 tablet    Refill:  0   predniSONE (DELTASONE) 20 MG tablet    Sig: Take 3 tablets once daily for 3 days followed by 2 tablets once daily for 3 days followed by 1 tablet once daily for 3 days and then stop    Dispense:  18 tablet    Refill:  0    I have reviewed the patients home medicines and have made adjustments as needed  Problem List / ED Course: Problem List Items Addressed This Visit   None Visit Diagnoses     COVID-19    -  Primary   Relevant Medications   azithromycin (ZITHROMAX) 500 mg in sodium chloride 0.9 % 250 mL IVPB (Completed)   Wheezing on exhalation                       This note was created using dictation software, which may contain spelling or grammatical errors.    Loetta Rough, MD 04/23/22 [phone]

## 2022-04-17 ENCOUNTER — Observation Stay (HOSPITAL_BASED_OUTPATIENT_CLINIC_OR_DEPARTMENT_OTHER): Payer: Medicare Other

## 2022-04-17 ENCOUNTER — Observation Stay (HOSPITAL_COMMUNITY): Payer: Medicare Other

## 2022-04-17 ENCOUNTER — Encounter (HOSPITAL_COMMUNITY): Payer: Self-pay | Admitting: Internal Medicine

## 2022-04-17 DIAGNOSIS — R7989 Other specified abnormal findings of blood chemistry: Secondary | ICD-10-CM | POA: Diagnosis not present

## 2022-04-17 DIAGNOSIS — U071 COVID-19: Secondary | ICD-10-CM

## 2022-04-17 DIAGNOSIS — J9601 Acute respiratory failure with hypoxia: Secondary | ICD-10-CM

## 2022-04-17 DIAGNOSIS — J441 Chronic obstructive pulmonary disease with (acute) exacerbation: Secondary | ICD-10-CM | POA: Diagnosis not present

## 2022-04-17 LAB — CBC
HCT: 34.8 % — ABNORMAL LOW (ref 39.0–52.0)
Hemoglobin: 10.9 g/dL — ABNORMAL LOW (ref 13.0–17.0)
MCH: 29.2 pg (ref 26.0–34.0)
MCHC: 31.3 g/dL (ref 30.0–36.0)
MCV: 93.3 fL (ref 80.0–100.0)
Platelets: 168 10*3/uL (ref 150–400)
RBC: 3.73 MIL/uL — ABNORMAL LOW (ref 4.22–5.81)
RDW: 13.9 % (ref 11.5–15.5)
WBC: 6.9 10*3/uL (ref 4.0–10.5)
nRBC: 0 % (ref 0.0–0.2)

## 2022-04-17 LAB — FOLATE: Folate: 5.8 ng/mL — ABNORMAL LOW (ref >5.9)

## 2022-04-17 LAB — COMPREHENSIVE METABOLIC PANEL
ALT: 12 U/L (ref 0–44)
AST: 16 U/L (ref 15–41)
Albumin: 3.1 g/dL — ABNORMAL LOW (ref 3.5–5.0)
Alkaline Phosphatase: 55 U/L (ref 38–126)
Anion gap: 9 (ref 5–15)
BUN: 24 mg/dL — ABNORMAL HIGH (ref 8–23)
CO2: 24 mmol/L (ref 22–32)
Calcium: 8.8 mg/dL — ABNORMAL LOW (ref 8.9–10.3)
Chloride: 106 mmol/L (ref 98–111)
Creatinine, Ser: 1.52 mg/dL — ABNORMAL HIGH (ref 0.61–1.24)
GFR, Estimated: 44 mL/min — ABNORMAL LOW (ref >60)
Glucose, Bld: 99 mg/dL (ref 70–99)
Potassium: 3.9 mmol/L (ref 3.5–5.1)
Sodium: 139 mmol/L (ref 135–145)
Total Bilirubin: 0.6 mg/dL (ref 0.3–1.2)
Total Protein: 6.6 g/dL (ref 6.5–8.1)

## 2022-04-17 LAB — PHOSPHORUS
Phosphorus: 3.5 mg/dL (ref 2.5–4.6)
Phosphorus: 3.7 mg/dL (ref 2.5–4.6)

## 2022-04-17 LAB — TSH: TSH: 2.355 u[IU]/mL (ref 0.350–4.500)

## 2022-04-17 LAB — BLOOD GAS, VENOUS
Acid-Base Excess: 0.1 mmol/L (ref 0.0–2.0)
Bicarbonate: 24.7 mmol/L (ref 20.0–28.0)
Drawn by: 6509
O2 Saturation: 83.9 %
Patient temperature: 36.6
pCO2, Ven: 38 mmHg — ABNORMAL LOW (ref 44–60)
pH, Ven: 7.42 (ref 7.25–7.43)
pO2, Ven: 48 mmHg — ABNORMAL HIGH (ref 32–45)

## 2022-04-17 LAB — GLUCOSE, CAPILLARY
Glucose-Capillary: 131 mg/dL — ABNORMAL HIGH (ref 70–99)
Glucose-Capillary: 194 mg/dL — ABNORMAL HIGH (ref 70–99)
Glucose-Capillary: 79 mg/dL (ref 70–99)
Glucose-Capillary: 91 mg/dL (ref 70–99)

## 2022-04-17 LAB — IRON AND TIBC
Iron: 23 ug/dL — ABNORMAL LOW (ref 45–182)
Saturation Ratios: 12 % — ABNORMAL LOW (ref 17.9–39.5)
TIBC: 193 ug/dL — ABNORMAL LOW (ref 250–450)
UIBC: 170 ug/dL

## 2022-04-17 LAB — LACTIC ACID, PLASMA
Lactic Acid, Venous: 1 mmol/L (ref 0.5–1.9)
Lactic Acid, Venous: 1 mmol/L (ref 0.5–1.9)

## 2022-04-17 LAB — EXPECTORATED SPUTUM ASSESSMENT W GRAM STAIN, RFLX TO RESP C

## 2022-04-17 LAB — RETICULOCYTES
Immature Retic Fract: 19.6 % — ABNORMAL HIGH (ref 2.3–15.9)
RBC.: 3.65 MIL/uL — ABNORMAL LOW (ref 4.22–5.81)
Retic Count, Absolute: 36.5 10*3/uL (ref 19.0–186.0)
Retic Ct Pct: 1 % (ref 0.4–3.1)

## 2022-04-17 LAB — PROCALCITONIN: Procalcitonin: <0.1 ng/mL

## 2022-04-17 LAB — OSMOLALITY, URINE: Osmolality, Ur: 508 mosm/kg (ref 300–900)

## 2022-04-17 LAB — FERRITIN: Ferritin: 533 ng/mL — ABNORMAL HIGH (ref 24–336)

## 2022-04-17 LAB — RESP PANEL BY RT-PCR (RSV, FLU A&B, COVID)  RVPGX2
Influenza A by PCR: NEGATIVE
Influenza B by PCR: NEGATIVE

## 2022-04-17 LAB — CK: Total CK: 36 U/L — ABNORMAL LOW (ref 49–397)

## 2022-04-17 LAB — MAGNESIUM: Magnesium: 1.7 mg/dL (ref 1.7–2.4)

## 2022-04-17 LAB — VITAMIN B12: Vitamin B-12: 287 pg/mL (ref 180–914)

## 2022-04-17 LAB — PROTIME-INR
INR: 1 (ref 0.8–1.2)
Prothrombin Time: 13.3 seconds (ref 11.4–15.2)

## 2022-04-17 LAB — PREALBUMIN: Prealbumin: 12 mg/dL — ABNORMAL LOW (ref 18–38)

## 2022-04-17 LAB — OSMOLALITY: Osmolality: 293 mOsm/kg (ref 275–295)

## 2022-04-17 MED ORDER — HYDROCODONE-ACETAMINOPHEN 5-325 MG PO TABS: 1.0000 | ORAL_TABLET | ORAL | Status: DC | PRN

## 2022-04-17 MED ORDER — SODIUM CHLORIDE (PF) 0.9 % IJ SOLN
INTRAMUSCULAR | Status: AC
  Filled 2022-04-17: qty 50

## 2022-04-17 MED ORDER — CHOLESTYRAMINE LIGHT 4 G PO PACK
4.0000 g | PACK | Freq: Two times a day (BID) | ORAL | Status: DC
  Administered 2022-04-17: 4 g via ORAL
  Filled 2022-04-17: qty 1

## 2022-04-17 MED ORDER — DIPHENOXYLATE-ATROPINE 2.5-0.025 MG PO TABS: 1.0000 | ORAL_TABLET | Freq: Four times a day (QID) | ORAL | 0 refills | Status: DC | PRN

## 2022-04-17 MED ORDER — GUAIFENESIN ER 600 MG PO TB12
600.0000 mg | ORAL_TABLET | Freq: Two times a day (BID) | ORAL | Status: DC
  Administered 2022-04-17 (×2): 600 mg via ORAL
  Filled 2022-04-17 (×2): qty 1

## 2022-04-17 MED ORDER — ONDANSETRON HCL 4 MG PO TABS: 4.0000 mg | ORAL_TABLET | Freq: Four times a day (QID) | ORAL | Status: DC | PRN

## 2022-04-17 MED ORDER — INSULIN ASPART 100 UNIT/ML IJ SOLN
0.0000 [IU] | Freq: Three times a day (TID) | INTRAMUSCULAR | Status: DC
  Administered 2022-04-17: 1 [IU] via SUBCUTANEOUS

## 2022-04-17 MED ORDER — CHOLESTYRAMINE 4 G PO PACK: 1.0000 | PACK | Freq: Two times a day (BID) | ORAL | Status: DC

## 2022-04-17 MED ORDER — IPRATROPIUM-ALBUTEROL 0.5-2.5 (3) MG/3ML IN SOLN: 3.0000 mL | Freq: Four times a day (QID) | RESPIRATORY_TRACT | Status: DC

## 2022-04-17 MED ORDER — PREDNISONE 20 MG PO TABS: ORAL_TABLET | ORAL | 0 refills | Status: DC

## 2022-04-17 MED ORDER — ESCITALOPRAM OXALATE 10 MG PO TABS
10.0000 mg | ORAL_TABLET | Freq: Every day | ORAL | Status: DC
  Administered 2022-04-17: 10 mg via ORAL
  Filled 2022-04-17: qty 1

## 2022-04-17 MED ORDER — SODIUM CHLORIDE 0.9 % IV SOLN: INTRAVENOUS | Status: AC

## 2022-04-17 MED ORDER — ONDANSETRON HCL 4 MG/2ML IJ SOLN: 4.0000 mg | Freq: Four times a day (QID) | INTRAMUSCULAR | Status: DC | PRN

## 2022-04-17 MED ORDER — SODIUM CHLORIDE 0.9 % IV SOLN
500.0000 mg | INTRAVENOUS | Status: AC
  Administered 2022-04-17: 500 mg via INTRAVENOUS
  Filled 2022-04-17: qty 5

## 2022-04-17 MED ORDER — PREDNISONE 20 MG PO TABS: 40.0000 mg | ORAL_TABLET | Freq: Every day | ORAL | Status: DC

## 2022-04-17 MED ORDER — ALBUTEROL SULFATE (2.5 MG/3ML) 0.083% IN NEBU: 2.5000 mg | INHALATION_SOLUTION | RESPIRATORY_TRACT | Status: DC | PRN

## 2022-04-17 MED ORDER — ACETAMINOPHEN 650 MG RE SUPP: 650.0000 mg | Freq: Four times a day (QID) | RECTAL | Status: DC | PRN

## 2022-04-17 MED ORDER — IOHEXOL 350 MG/ML SOLN
75.0000 mL | Freq: Once | INTRAVENOUS | Status: AC | PRN
  Administered 2022-04-17: 75 mL via INTRAVENOUS

## 2022-04-17 MED ORDER — METHYLPREDNISOLONE SODIUM SUCC 40 MG IJ SOLR
40.0000 mg | INTRAMUSCULAR | Status: AC
  Administered 2022-04-17: 40 mg via INTRAVENOUS
  Filled 2022-04-17: qty 1

## 2022-04-17 MED ORDER — ROSUVASTATIN CALCIUM 5 MG PO TABS
5.0000 mg | ORAL_TABLET | Freq: Every day | ORAL | Status: DC
  Administered 2022-04-17: 5 mg via ORAL
  Filled 2022-04-17: qty 1

## 2022-04-17 MED ORDER — AZITHROMYCIN 250 MG PO TABS: 500.0000 mg | ORAL_TABLET | Freq: Every day | ORAL | Status: DC

## 2022-04-17 MED ORDER — ACETAMINOPHEN 325 MG PO TABS: 650.0000 mg | ORAL_TABLET | Freq: Four times a day (QID) | ORAL | Status: DC | PRN

## 2022-04-17 MED ORDER — CLOPIDOGREL BISULFATE 75 MG PO TABS
75.0000 mg | ORAL_TABLET | Freq: Every day | ORAL | Status: DC
  Administered 2022-04-17: 75 mg via ORAL
  Filled 2022-04-17: qty 1

## 2022-04-17 MED ORDER — IPRATROPIUM-ALBUTEROL 0.5-2.5 (3) MG/3ML IN SOLN
3.0000 mL | Freq: Three times a day (TID) | RESPIRATORY_TRACT | Status: DC
  Filled 2022-04-17: qty 3

## 2022-04-17 NOTE — Assessment & Plan Note (Signed)
Replace and recheck 

## 2022-04-17 NOTE — Discharge Summary (Signed)
[facility]  Physician Discharge Summary   Patient ID: MCGUIRE DRESEL MRN: [id number] DOB/AGE: 1932/04/14 89 y.o.  Admit date: 04/16/2022 Discharge date:   04/17/2022   PCP: Chilton Greathouse, MD  DISCHARGE DIAGNOSES:    COPD with acute exacerbation   Diabetes mellitus type 2 in nonobese   Hyperlipidemia   TOBACCO ABUSE   Essential hypertension   Carotid bruit   Coronary artery disease involving native coronary artery of native heart without angina pectoris   Heart block AV second degree Hypoxia   Hypomagnesemia   COVID-19 virus infection   RECOMMENDATIONS FOR OUTPATIENT FOLLOW UP: Follow-up with PCP in 1 to 2 weeks    Home Health: None Equipment/Devices: None  CODE STATUS: DNR  DISCHARGE CONDITION: fair  Diet recommendation: As before  INITIAL HISTORY: 89 y.o. male with medical history significant of Dm2, HTN, CKD3, HLD, OSA, anemia, AV block 2nd degree AV block, GERD, obesity, copd history of close compression fracture Was sent from his nursing facility as patient was COVID-19 positive had low hemoglobin and had a mild drop in his oxygen saturation with ambulation.  Apparently there was a outbreak of COVID-19 at the facility last week.  Patient did not have any significant symptoms.  In the emergency department he underwent chest x-ray followed by CT angiogram which did not show any pneumonias.  Did not show any PE.  His saturations dropped into the 89 briefly with ambulation but recovered to the 90s on room air.  He was observed in the hospital overnight.   HOSPITAL COURSE:   Patient not very symptomatic from his COVID-19.  Was not started on any antibiotics here in the hospital but looks like he was getting molnupiravir at the skilled nursing facility.  He was given steroids due to concern for COPD exacerbation.  Does not have any wheezing this morning.  As mentioned above chest x-ray and CT angiogram chest did not show any pneumonia or PE.    He was only  mildly hypoxic without any other elements suggesting acute respiratory failure or respiratory distress.  He will be ambulated this morning to make sure he does not need home oxygen.  He is otherwise medically stable for discharge back to skilled nursing facility.  His other medical issues are stable as outlined in the H&P.  Obesity Estimated body mass index is 30.46 kg/m as calculated from the following:   Height as of this encounter: 5\' 3"  (1.6 m).   Weight as of this encounter: 78 kg.  Patient is stable.  Wants to go back to his facility.  Okay for discharge today after ambulatory pulse oximetry.   PERTINENT LABS:  The results of significant diagnostics from this hospitalization (including imaging, microbiology, ancillary and laboratory) are listed below for reference.    Microbiology: Recent Results (from the past 240 hour(s))  Resp panel by RT-PCR (RSV, Flu A&B, Covid) Anterior Nasal Swab     Status: Abnormal   Collection Time: 04/16/22  5:55 PM   Specimen: Anterior Nasal Swab  Result Value Ref Range Status   SARS Coronavirus 2 by RT PCR POSITIVE (A) NEGATIVE Final    Comment: CYCLE THRESHOLD: 34.1 (NOTE) SARS-CoV-2 target nucleic acids are DETECTED.  The SARS-CoV-2 RNA is generally detectable in upper respiratory specimens during the acute phase of infection. Positive results are indicative of the presence of the identified virus, but do not rule out bacterial infection or co-infection with other pathogens not detected by the test. Clinical correlation with patient history and  other diagnostic information is necessary to determine patient infection status. The expected result is Negative.  Fact Sheet for Patients: BloggerCourse.com  Fact Sheet for Healthcare Providers: SeriousBroker.it  This test is not yet approved or cleared by the [place] FDA and  has been authorized for detection and/or diagnosis of SARS-CoV-2  by FDA under an Emergency Use Authorization (EUA).  This EUA will remain in effect (meaning this test can be used) for the duration of  the COVID-19 declaration under Sec tion 564(b)(1) of the Act, 21 U.S.C. section 360bbb-3(b)(1), unless the authorization is terminated or revoked sooner.     Influenza A by PCR NEGATIVE NEGATIVE Final   Influenza B by PCR NEGATIVE NEGATIVE Final    Comment: (NOTE) The Xpert Xpress SARS-CoV-2/FLU/RSV plus assay is intended as an aid in the diagnosis of influenza from Nasopharyngeal swab specimens and should not be used as a sole basis for treatment. Nasal washings and aspirates are unacceptable for Xpert Xpress SARS-CoV-2/FLU/RSV testing.  Fact Sheet for Patients: BloggerCourse.com  Fact Sheet for Healthcare Providers: SeriousBroker.it  This test is not yet approved or cleared by the [place] FDA and has been authorized for detection and/or diagnosis of SARS-CoV-2 by FDA under an Emergency Use Authorization (EUA). This EUA will remain in effect (meaning this test can be used) for the duration of the COVID-19 declaration under Section 564(b)(1) of the Act, 21 U.S.C. section 360bbb-3(b)(1), unless the authorization is terminated or revoked.     Resp Syncytial Virus by PCR NEGATIVE NEGATIVE Final    Comment: (NOTE) Fact Sheet for Patients: BloggerCourse.com  Fact Sheet for Healthcare Providers: SeriousBroker.it  This test is not yet approved or cleared by the [place] FDA and has been authorized for detection and/or diagnosis of SARS-CoV-2 by FDA under an Emergency Use Authorization (EUA). This EUA will remain in effect (meaning this test can be used) for the duration of the COVID-19 declaration under Section 564(b)(1) of the Act, 21 U.S.C. section 360bbb-3(b)(1), unless the authorization is terminated or revoked.  Performed at  [facility], [address], [place], [place] [address]   Expectorated Sputum Assessment w Gram Stain, Rflx to Resp Cult     Status: None   Collection Time: 04/17/22 12:57 AM   Specimen: Sputum  Result Value Ref Range Status   Specimen Description SPU  Final   Special Requests NONE  Final   Sputum evaluation   Final    Sputum specimen not acceptable for testing.  Please recollect.   Performed at [facility], [address] [place], [place] [address]    Report Status 04/17/2022 FINAL  Final     Labs:   Basic Metabolic Panel: Recent Labs  Lab 04/16/22 1807 04/17/22 0006 04/17/22 0208  NA 137  --  139  K 4.0  --  3.9  CL 108  --  106  CO2 21*  --  24  GLUCOSE 144*  --  99  BUN 25*  --  24*  CREATININE 1.51*  --  1.52*  CALCIUM 8.3*  --  8.8*  MG 1.6*  --  1.7  PHOS  --  3.7 3.5   Liver Function Tests: Recent Labs  Lab 04/16/22 1807 04/17/22 0208  AST 16 16  ALT 14 12  ALKPHOS 57 55  BILITOT 0.4 0.6  PROT 6.2* 6.6  ALBUMIN 2.9* 3.1*    CBC: Recent Labs  Lab 04/16/22 1807 04/17/22 0208  WBC 6.7 6.9  NEUTROABS 4.1  --  HGB 10.5* 10.9*  HCT 33.7* 34.8*  MCV 94.7 93.3  PLT 188 168   Cardiac Enzymes: Recent Labs  Lab 04/17/22 0006  CKTOTAL 36*     CBG: Recent Labs  Lab 04/16/22 2355 04/17/22 0356 04/17/22 0821  GLUCAP 91 79 131*     IMAGING STUDIES CT Angio Chest PE W and/or Wo Contrast  Result Date: 04/17/2022 CLINICAL DATA:  Elevated D-dimer and COVID-19 positivity, initial encounter EXAM: CT ANGIOGRAPHY CHEST WITH CONTRAST TECHNIQUE: Multidetector CT imaging of the chest was performed using the standard protocol during bolus administration of intravenous contrast. Multiplanar CT image reconstructions and MIPs were obtained to evaluate the vascular anatomy. RADIATION DOSE REDUCTION: This exam was performed according to the departmental dose-optimization program which includes automated exposure  control, adjustment of the mA and/or kV according to patient size and/or use of iterative reconstruction technique. CONTRAST:  60mL OMNIPAQUE IOHEXOL 350 MG/ML SOLN COMPARISON:  Plain film from the previous day, CT from 01/27/2022. FINDINGS: Cardiovascular: Atherosclerotic calcifications of the thoracic aorta are noted. Ascending aorta is dilated to 4.1 cm. No dissection is seen. Heart is at the upper limits of normal in size. Coronary calcifications are noted. Pulmonary artery is well visualized with a normal branching pattern bilaterally. No filling defect to suggest pulmonary embolism is noted. Mediastinum/Nodes: Thoracic inlet is within normal limits. No hilar or mediastinal adenopathy is noted. The esophagus is within normal limits. Lungs/Pleura: Small effusions are noted bilaterally. Mild right basilar atelectatic changes are seen. 11 mm nodule is noted in the medial aspect of the right upper lobe stable in appearance from the prior exam (43/6). Previously seen ground-glass nodule in the left upper lobe anteriorly is no longer identified. Stable nodule is noted in the posterior left upper lobe better visualized on the prior exam (52/6). Stable nodule along the diaphragmatic margin of the left lower lobe is seen best noted on image number 102 of series 6. stable nodule in the medial aspect of the right middle lobe is noted on image number 81 of series 6. Upper Abdomen: No acute abnormality. Musculoskeletal: Degenerative changes of the thoracic spine are noted. No acute rib abnormality is seen. Review of the MIP images confirms the above findings. IMPRESSION: No evidence of pulmonary emboli. Dilatation of the ascending aorta to 4.1 cm. Recommend annual imaging followup by CTA or MRA. This recommendation follows 2010 ACCF/AHA/AATS/ACR/ASA/SCA/SCAI/SIR/STS/SVM Guidelines for the Diagnosis and Management of Patients with Thoracic Aortic Disease. Circulation. 2010; 121: E720-N470. Aortic aneurysm NOS (ICD10-I71.9)  Multiple lung nodules identified bilaterally which are stable from the prior exam. The largest of these measures 11 mm in the medial aspect of the right upper lobe. These are stable from the prior exam of January of 2024. Given their stability, future CT at 15-21 months (from today's scan) is recommended. This recommendation follows the consensus statement: Guidelines for Management of Incidental Pulmonary Nodules Detected on CT Images: From the Fleischner Society 2017; Radiology 2017; 284:228-243. Aortic Atherosclerosis (ICD10-I70.0). Electronically Signed   By: Alcide Clever M.D.   On: 04/17/2022 03:01   DG Chest Portable 1 View  Result Date: 04/16/2022 CLINICAL DATA:  COVID EXAM: PORTABLE CHEST 1 VIEW COMPARISON:  Chest x-ray dated December 27 /2012 FINDINGS: The heart size and mediastinal contours are within normal limits. Both lungs are clear. The visualized skeletal structures are unremarkable. IMPRESSION: No active disease. Electronically Signed   By: Allegra Lai M.D.   On: 04/16/2022 18:29    DISCHARGE EXAMINATION: Vitals:   04/16/22 2210 04/16/22  2333 04/17/22 0401 04/17/22 0823  BP:  133/67 131/73 129/61  Pulse:  72 (!) 55 62  Resp:  20 18 20   Temp: 97.9 F (36.6 C) 97.9 F (36.6 C) 98 F (36.7 C) 97.8 F (36.6 C)  TempSrc: Oral Oral Oral Oral  SpO2:  95% 91% 95%  Weight:      Height:       General appearance: Awake alert.  In no distress Resp: Clear to auscultation bilaterally.  Normal effort Cardio: S1-S2 is normal regular.  No S3-S4.  No rubs murmurs or bruit GI: Abdomen is soft.  Nontender nondistended.  Bowel sounds are present normal.  No masses organomegaly Extremities: No edema.  Full range of motion of lower extremities. Neurologic: Alert and oriented x3.  No focal neurological deficits.    DISPOSITION: SNF  Discharge Instructions     Call MD for:  difficulty breathing, headache or visual disturbances   Complete by: As directed    Call MD for:  extreme  fatigue   Complete by: As directed    Call MD for:  persistant dizziness or light-headedness   Complete by: As directed    Call MD for:  persistant nausea and vomiting   Complete by: As directed    Call MD for:  severe uncontrolled pain   Complete by: As directed    Call MD for:  temperature >100.4   Complete by: As directed    Discharge instructions   Complete by: As directed    Please review instructions on the discharge summary.  You were cared for by a hospitalist during your hospital stay. If you have any questions about your discharge medications or the care you received while you were in the hospital after you are discharged, you can call the unit and asked to speak with the hospitalist on call if the hospitalist that took care of you is not available. Once you are discharged, your primary care physician will handle any further medical issues. Please note that NO REFILLS for any discharge medications will be authorized once you are discharged, as it is imperative that you return to your primary care physician (or establish a relationship with a primary care physician if you do not have one) for your aftercare needs so that they can reassess your need for medications and monitor your lab values. If you do not have a primary care physician, you can call [phone] for a physician referral.   Increase activity slowly   Complete by: As directed           Allergies as of 04/17/2022   No Known Allergies      Medication List     STOP taking these medications    amoxicillin-clavulanate 875-125 MG tablet Commonly known as: AUGMENTIN       TAKE these medications    acetaminophen 325 MG tablet Commonly known as: TYLENOL Take 650 mg by mouth every 6 (six) hours as needed (for pain).   albuterol 108 (90 Base) MCG/ACT inhaler Commonly known as: VENTOLIN HFA Inhale 2 puffs into the lungs every 4 (four) hours as needed for wheezing or shortness of breath.   amLODipine 5 MG  tablet Commonly known as: NORVASC Take 5 mg by mouth daily.   ascorbic acid 500 MG tablet Commonly known as: VITAMIN C Take 500 mg by mouth daily.   aspirin EC 325 MG tablet Take 325 mg by mouth daily.   azithromycin 250 MG tablet Commonly known as: ZITHROMAX Take 250 mg  by mouth at bedtime.   cholestyramine 4 g packet Commonly known as: QUESTRAN Take 1 packet by mouth 2 (two) times daily.   clopidogrel 75 MG tablet Commonly known as: PLAVIX Take 1 tablet (75 mg total) by mouth daily.   diphenoxylate-atropine 2.5-0.025 MG tablet Commonly known as: LOMOTIL Take 1 tablet by mouth 4 (four) times daily as needed for diarrhea or loose stools.   escitalopram 10 MG tablet Commonly known as: LEXAPRO Take 10 mg by mouth daily.   ferrous sulfate 325 (65 FE) MG EC tablet Take 325 mg by mouth daily with breakfast.   ipratropium-albuterol 0.5-2.5 (3) MG/3ML Soln Commonly known as: DUONEB Take 3 mLs by nebulization every 6 (six) hours.   irbesartan-hydrochlorothiazide 300-12.5 MG tablet Commonly known as: AVALIDE Take 1 tablet by mouth daily.   Lagevrio 200 MG Caps capsule Generic drug: molnupiravir EUA Take 4 capsules by mouth 2 (two) times daily.   Lantus SoloStar 100 UNIT/ML Solostar Pen Generic drug: insulin glargine Inject 7 Units into the skin at bedtime.   loratadine 10 MG tablet Commonly known as: CLARITIN Take 10 mg by mouth daily.   Magnesium 500 MG Tabs Take 500 mg by mouth in the morning.   melatonin 5 MG Tabs Take 5 mg by mouth at bedtime.   Mucinex 600 MG 12 hr tablet Generic drug: guaiFENesin Take 600 mg by mouth every 12 (twelve) hours.   pantoprazole 40 MG tablet Commonly known as: PROTONIX Take 1 tablet (40 mg total) by mouth daily.   predniSONE 20 MG tablet Commonly known as: DELTASONE Take 3 tablets once daily for 3 days followed by 2 tablets once daily for 3 days followed by 1 tablet once daily for 3 days and then stop   rosuvastatin 5 MG  tablet Commonly known as: CRESTOR Take 5 mg by mouth at bedtime.   sitaGLIPtin 100 MG tablet Commonly known as: JANUVIA Take 100 mg by mouth in the morning.   Vitamin B Complex Tabs Take 1 tablet by mouth daily.   Vitamin D3 50 MCG (2000 UT) Tabs Generic drug: Cholecalciferol Take 2,000 Units by mouth daily.   Zinc 50 MG Tabs Take 50 mg by mouth daily.          Follow-up Information     Avva, Ravisankar, MD. Schedule an appointment as soon as possible for a visit in 1 week(s).   Specialty: Internal Medicine Why: post hospitalization follow up Contact information: [address] [place] [address] [phone]                 TOTAL DISCHARGE TIME: 35 minutes  Larayne Baxley Rito Ehrlich  [facility] Pager on [email or web address]  04/17/2022, 9:11 AM

## 2022-04-17 NOTE — TOC Transition Note (Addendum)
Transition of Care [facility]) - CM/SW Discharge Note   Patient Details  Name: [name] [name] MRN: [id number] Date of Birth: 05-22-1932  [facility] CM/SW Contact:  Coralyn Helling, LCSW Phone Number: 04/17/2022, 9:43 AM   Clinical Narrative:   TOC notified at dc that patient can return to SNF. Patient is from [facility] for rehab. Per Meryle Ready in admissions confirms  patient can return to facility. No FL2 needed. TOC to fax dc summary when ready. Patient to transport by EMS. Room 35.   RN # for report: [phone]  10:57 AM DC summary faxed [facility] called for transport.           Patient Goals and CMS Choice      Discharge Placement                         Discharge Plan and Services Additional resources added to the After Visit Summary for                                       Social Determinants of Health (SDOH) Interventions SDOH Screenings   Food Insecurity: No Food Insecurity (04/17/2022)  Housing: Low Risk  (04/17/2022)  Transportation Needs: No Transportation Needs (04/17/2022)  Utilities: Not At Risk (04/17/2022)  Tobacco Use: High Risk (04/17/2022)     Readmission Risk Interventions     No data to display

## 2022-04-17 NOTE — Progress Notes (Signed)
SATURATION QUALIFICATIONS: (This note is used to comply with regulatory documentation for home oxygen)  Patient Saturations on Room Air at Rest = 94%  Patient Saturations on Room Air while Ambulating = 90%  Patient Saturations on 0 Liters of oxygen while Ambulating = 92%  Please briefly explain why patient needs home oxygen:  Pt O2 saturation dropped to 90% while walking and talking. Pt recovered to 94% while walking.  Val Eagle

## 2022-04-17 NOTE — Assessment & Plan Note (Signed)
Chronic stable continue Crestor 5 mg a day

## 2022-04-17 NOTE — Assessment & Plan Note (Addendum)
COVID infection -incidental finding patient is  asymptomatic CXR with no infiltrates  Hypoxia more likely due to copd exacerbation suspect Covid was last week Supportive measures Avoid over aggressive fluid resuscitation Airborne precautions  CT is 34 no treatment is indicated

## 2022-04-17 NOTE — Assessment & Plan Note (Signed)
Chronic stable patient off of beta-blockers

## 2022-04-17 NOTE — Assessment & Plan Note (Addendum)
-  -   Will initiate: Steroid taper  -  Antibiotics azythro - Albuterol  PRN, - scheduled duoneb,  -  Breo or Dulera at discharge   -  Mucinex.  Titrate O2 to saturation >90%. Follow patients respiratory status.     VBG  -   Currently mentating well no evidence of symptomatic hypercarbia

## 2022-04-17 NOTE — Assessment & Plan Note (Signed)
In the setting of recent COVID infection patient had a pneumonia last week and now has recovered and feeling better but still tested positive for COVID suspect he had COVID last week noted elevated D-dimer added as an outpatient.  CT ordered currently pending Chest x-ray without any infiltrates Mild hypoxic respiratory failure likely in the setting of mild COPD exacerbation  this patient has acute respiratory failure with Hypoxia   as documented by the presence of following: O2 saturatio< 90% on RA  Likely due to:  COPD exacerbation,   Provide O2 therapy and titrate as needed  Continuous pulse ox   check Pulse ox with ambulation prior to discharge   may need  TC consult for home O2 set up   Prone if able  flutter valve ordered

## 2022-04-17 NOTE — Assessment & Plan Note (Signed)
Per family patient does not wish to undergo further invasive interventions.  Continue Plavix and statin

## 2022-04-17 NOTE — Assessment & Plan Note (Signed)
Chronic stable continue Plavix

## 2022-04-17 NOTE — Assessment & Plan Note (Signed)
Currently in remission 

## 2022-04-17 NOTE — Assessment & Plan Note (Signed)
-   Order Sensitive  SSI   - continue home insulin but decreased to  5 units,  -  check TSH and HgA1C  - Hold by mouth medications

## 2022-04-17 NOTE — Plan of Care (Signed)
  Problem: Education: Goal: Knowledge of disease or condition will improve Outcome: Adequate for Discharge Goal: Knowledge of the prescribed therapeutic regimen will improve Outcome: Adequate for Discharge Goal: Individualized Educational Video(s) Outcome: Adequate for Discharge   Problem: Activity: Goal: Ability to tolerate increased activity will improve Outcome: Adequate for Discharge Goal: Will verbalize the importance of balancing activity with adequate rest periods Outcome: Adequate for Discharge   Problem: Respiratory: Goal: Ability to maintain a clear airway will improve Outcome: Adequate for Discharge Goal: Levels of oxygenation will improve Outcome: Adequate for Discharge Goal: Ability to maintain adequate ventilation will improve Outcome: Adequate for Discharge   

## 2022-04-17 NOTE — Assessment & Plan Note (Signed)
Permissive hypertension until CTA is back

## 2022-04-17 NOTE — Progress Notes (Signed)
Lower extremity venous bilateral study completed.   Please see CV Proc for preliminary results.   Rayneisha Bouza, RDMS, RVT  

## 2022-04-21 DIAGNOSIS — K529 Noninfective gastroenteritis and colitis, unspecified: Secondary | ICD-10-CM | POA: Diagnosis not present

## 2022-04-21 DIAGNOSIS — D508 Other iron deficiency anemias: Secondary | ICD-10-CM | POA: Diagnosis not present

## 2022-04-21 DIAGNOSIS — I2510 Atherosclerotic heart disease of native coronary artery without angina pectoris: Secondary | ICD-10-CM | POA: Diagnosis not present

## 2022-04-21 DIAGNOSIS — N189 Chronic kidney disease, unspecified: Secondary | ICD-10-CM | POA: Diagnosis not present

## 2022-04-21 DIAGNOSIS — U071 COVID-19: Secondary | ICD-10-CM | POA: Diagnosis not present

## 2022-04-21 DIAGNOSIS — I10 Essential (primary) hypertension: Secondary | ICD-10-CM | POA: Diagnosis not present

## 2022-04-21 DIAGNOSIS — J302 Other seasonal allergic rhinitis: Secondary | ICD-10-CM | POA: Diagnosis not present

## 2022-04-21 DIAGNOSIS — I6521 Occlusion and stenosis of right carotid artery: Secondary | ICD-10-CM | POA: Diagnosis not present

## 2022-04-21 DIAGNOSIS — I441 Atrioventricular block, second degree: Secondary | ICD-10-CM | POA: Diagnosis not present

## 2022-04-21 DIAGNOSIS — E119 Type 2 diabetes mellitus without complications: Secondary | ICD-10-CM | POA: Diagnosis not present

## 2022-04-21 DIAGNOSIS — I4892 Unspecified atrial flutter: Secondary | ICD-10-CM | POA: Diagnosis not present

## 2022-04-21 DIAGNOSIS — K219 Gastro-esophageal reflux disease without esophagitis: Secondary | ICD-10-CM | POA: Diagnosis not present

## 2022-04-23 DIAGNOSIS — I10 Essential (primary) hypertension: Secondary | ICD-10-CM | POA: Diagnosis not present

## 2022-04-23 DIAGNOSIS — U071 COVID-19: Secondary | ICD-10-CM | POA: Diagnosis not present

## 2022-04-23 DIAGNOSIS — J302 Other seasonal allergic rhinitis: Secondary | ICD-10-CM | POA: Diagnosis not present

## 2022-04-23 DIAGNOSIS — N189 Chronic kidney disease, unspecified: Secondary | ICD-10-CM | POA: Diagnosis not present

## 2022-04-23 DIAGNOSIS — E119 Type 2 diabetes mellitus without complications: Secondary | ICD-10-CM | POA: Diagnosis not present

## 2022-04-23 DIAGNOSIS — K529 Noninfective gastroenteritis and colitis, unspecified: Secondary | ICD-10-CM | POA: Diagnosis not present

## 2022-04-23 DIAGNOSIS — N179 Acute kidney failure, unspecified: Secondary | ICD-10-CM | POA: Diagnosis not present

## 2022-04-23 DIAGNOSIS — K219 Gastro-esophageal reflux disease without esophagitis: Secondary | ICD-10-CM | POA: Diagnosis not present

## 2022-04-23 DIAGNOSIS — I441 Atrioventricular block, second degree: Secondary | ICD-10-CM | POA: Diagnosis not present

## 2022-04-23 DIAGNOSIS — I2510 Atherosclerotic heart disease of native coronary artery without angina pectoris: Secondary | ICD-10-CM | POA: Diagnosis not present

## 2022-04-23 DIAGNOSIS — S32010A Wedge compression fracture of first lumbar vertebra, initial encounter for closed fracture: Secondary | ICD-10-CM | POA: Diagnosis not present

## 2022-04-23 DIAGNOSIS — I4892 Unspecified atrial flutter: Secondary | ICD-10-CM | POA: Diagnosis not present

## 2022-04-30 DIAGNOSIS — I10 Essential (primary) hypertension: Secondary | ICD-10-CM | POA: Diagnosis not present

## 2022-04-30 DIAGNOSIS — K219 Gastro-esophageal reflux disease without esophagitis: Secondary | ICD-10-CM | POA: Diagnosis not present

## 2022-04-30 DIAGNOSIS — E119 Type 2 diabetes mellitus without complications: Secondary | ICD-10-CM | POA: Diagnosis not present

## 2022-04-30 DIAGNOSIS — I6521 Occlusion and stenosis of right carotid artery: Secondary | ICD-10-CM | POA: Diagnosis not present

## 2022-04-30 DIAGNOSIS — B350 Tinea barbae and tinea capitis: Secondary | ICD-10-CM | POA: Diagnosis not present

## 2022-04-30 DIAGNOSIS — I2510 Atherosclerotic heart disease of native coronary artery without angina pectoris: Secondary | ICD-10-CM | POA: Diagnosis not present

## 2022-04-30 DIAGNOSIS — N189 Chronic kidney disease, unspecified: Secondary | ICD-10-CM | POA: Diagnosis not present

## 2022-04-30 DIAGNOSIS — D508 Other iron deficiency anemias: Secondary | ICD-10-CM | POA: Diagnosis not present

## 2022-05-06 DIAGNOSIS — E1159 Type 2 diabetes mellitus with other circulatory complications: Secondary | ICD-10-CM | POA: Diagnosis not present

## 2022-05-06 DIAGNOSIS — B351 Tinea unguium: Secondary | ICD-10-CM | POA: Diagnosis not present

## 2022-05-12 DIAGNOSIS — E119 Type 2 diabetes mellitus without complications: Secondary | ICD-10-CM | POA: Diagnosis not present

## 2022-05-12 DIAGNOSIS — J302 Other seasonal allergic rhinitis: Secondary | ICD-10-CM | POA: Diagnosis not present

## 2022-05-12 DIAGNOSIS — I2510 Atherosclerotic heart disease of native coronary artery without angina pectoris: Secondary | ICD-10-CM | POA: Diagnosis not present

## 2022-05-12 DIAGNOSIS — D508 Other iron deficiency anemias: Secondary | ICD-10-CM | POA: Diagnosis not present

## 2022-05-12 DIAGNOSIS — I6521 Occlusion and stenosis of right carotid artery: Secondary | ICD-10-CM | POA: Diagnosis not present

## 2022-05-12 DIAGNOSIS — I4892 Unspecified atrial flutter: Secondary | ICD-10-CM | POA: Diagnosis not present

## 2022-05-12 DIAGNOSIS — B350 Tinea barbae and tinea capitis: Secondary | ICD-10-CM | POA: Diagnosis not present

## 2022-05-12 DIAGNOSIS — K219 Gastro-esophageal reflux disease without esophagitis: Secondary | ICD-10-CM | POA: Diagnosis not present

## 2022-05-12 DIAGNOSIS — I10 Essential (primary) hypertension: Secondary | ICD-10-CM | POA: Diagnosis not present

## 2022-05-21 DIAGNOSIS — N189 Chronic kidney disease, unspecified: Secondary | ICD-10-CM | POA: Diagnosis not present

## 2022-05-21 DIAGNOSIS — I4892 Unspecified atrial flutter: Secondary | ICD-10-CM | POA: Diagnosis not present

## 2022-05-21 DIAGNOSIS — I6521 Occlusion and stenosis of right carotid artery: Secondary | ICD-10-CM | POA: Diagnosis not present

## 2022-05-21 DIAGNOSIS — K219 Gastro-esophageal reflux disease without esophagitis: Secondary | ICD-10-CM | POA: Diagnosis not present

## 2022-05-21 DIAGNOSIS — I2510 Atherosclerotic heart disease of native coronary artery without angina pectoris: Secondary | ICD-10-CM | POA: Diagnosis not present

## 2022-05-21 DIAGNOSIS — F32A Depression, unspecified: Secondary | ICD-10-CM | POA: Diagnosis not present

## 2022-05-21 DIAGNOSIS — I10 Essential (primary) hypertension: Secondary | ICD-10-CM | POA: Diagnosis not present

## 2022-05-21 DIAGNOSIS — E119 Type 2 diabetes mellitus without complications: Secondary | ICD-10-CM | POA: Diagnosis not present

## 2022-05-21 DIAGNOSIS — J302 Other seasonal allergic rhinitis: Secondary | ICD-10-CM | POA: Diagnosis not present

## 2022-05-21 DIAGNOSIS — B350 Tinea barbae and tinea capitis: Secondary | ICD-10-CM | POA: Diagnosis not present

## 2022-05-26 DIAGNOSIS — E119 Type 2 diabetes mellitus without complications: Secondary | ICD-10-CM | POA: Diagnosis not present

## 2022-05-26 DIAGNOSIS — N189 Chronic kidney disease, unspecified: Secondary | ICD-10-CM | POA: Diagnosis not present

## 2022-05-26 DIAGNOSIS — F32A Depression, unspecified: Secondary | ICD-10-CM | POA: Diagnosis not present

## 2022-05-26 DIAGNOSIS — K219 Gastro-esophageal reflux disease without esophagitis: Secondary | ICD-10-CM | POA: Diagnosis not present

## 2022-05-26 DIAGNOSIS — I2510 Atherosclerotic heart disease of native coronary artery without angina pectoris: Secondary | ICD-10-CM | POA: Diagnosis not present

## 2022-05-26 DIAGNOSIS — I6521 Occlusion and stenosis of right carotid artery: Secondary | ICD-10-CM | POA: Diagnosis not present

## 2022-05-26 DIAGNOSIS — I4892 Unspecified atrial flutter: Secondary | ICD-10-CM | POA: Diagnosis not present

## 2022-05-26 DIAGNOSIS — J302 Other seasonal allergic rhinitis: Secondary | ICD-10-CM | POA: Diagnosis not present

## 2022-05-26 DIAGNOSIS — B352 Tinea manuum: Secondary | ICD-10-CM | POA: Diagnosis not present

## 2022-05-26 DIAGNOSIS — D508 Other iron deficiency anemias: Secondary | ICD-10-CM | POA: Diagnosis not present

## 2022-05-26 DIAGNOSIS — I10 Essential (primary) hypertension: Secondary | ICD-10-CM | POA: Diagnosis not present

## 2022-06-02 DIAGNOSIS — E1165 Type 2 diabetes mellitus with hyperglycemia: Secondary | ICD-10-CM | POA: Diagnosis not present

## 2022-06-06 DIAGNOSIS — N189 Chronic kidney disease, unspecified: Secondary | ICD-10-CM | POA: Diagnosis not present

## 2022-06-06 DIAGNOSIS — I10 Essential (primary) hypertension: Secondary | ICD-10-CM | POA: Diagnosis not present

## 2022-06-06 DIAGNOSIS — I2510 Atherosclerotic heart disease of native coronary artery without angina pectoris: Secondary | ICD-10-CM | POA: Diagnosis not present

## 2022-06-06 DIAGNOSIS — E119 Type 2 diabetes mellitus without complications: Secondary | ICD-10-CM | POA: Diagnosis not present

## 2022-06-12 DIAGNOSIS — E119 Type 2 diabetes mellitus without complications: Secondary | ICD-10-CM | POA: Diagnosis not present

## 2022-06-12 DIAGNOSIS — D508 Other iron deficiency anemias: Secondary | ICD-10-CM | POA: Diagnosis not present

## 2022-06-12 DIAGNOSIS — I2510 Atherosclerotic heart disease of native coronary artery without angina pectoris: Secondary | ICD-10-CM | POA: Diagnosis not present

## 2022-06-12 DIAGNOSIS — I10 Essential (primary) hypertension: Secondary | ICD-10-CM | POA: Diagnosis not present

## 2022-06-12 DIAGNOSIS — K219 Gastro-esophageal reflux disease without esophagitis: Secondary | ICD-10-CM | POA: Diagnosis not present

## 2022-06-12 DIAGNOSIS — B352 Tinea manuum: Secondary | ICD-10-CM | POA: Diagnosis not present

## 2022-06-12 DIAGNOSIS — N189 Chronic kidney disease, unspecified: Secondary | ICD-10-CM | POA: Diagnosis not present

## 2022-06-12 DIAGNOSIS — J302 Other seasonal allergic rhinitis: Secondary | ICD-10-CM | POA: Diagnosis not present

## 2022-06-12 DIAGNOSIS — I6521 Occlusion and stenosis of right carotid artery: Secondary | ICD-10-CM | POA: Diagnosis not present

## 2022-06-18 DIAGNOSIS — E119 Type 2 diabetes mellitus without complications: Secondary | ICD-10-CM | POA: Diagnosis not present

## 2022-06-18 DIAGNOSIS — N189 Chronic kidney disease, unspecified: Secondary | ICD-10-CM | POA: Diagnosis not present

## 2022-06-18 DIAGNOSIS — I10 Essential (primary) hypertension: Secondary | ICD-10-CM | POA: Diagnosis not present

## 2022-06-18 DIAGNOSIS — I2510 Atherosclerotic heart disease of native coronary artery without angina pectoris: Secondary | ICD-10-CM | POA: Diagnosis not present

## 2022-06-25 DIAGNOSIS — J302 Other seasonal allergic rhinitis: Secondary | ICD-10-CM | POA: Diagnosis not present

## 2022-06-25 DIAGNOSIS — E119 Type 2 diabetes mellitus without complications: Secondary | ICD-10-CM | POA: Diagnosis not present

## 2022-06-25 DIAGNOSIS — I4892 Unspecified atrial flutter: Secondary | ICD-10-CM | POA: Diagnosis not present

## 2022-06-25 DIAGNOSIS — K219 Gastro-esophageal reflux disease without esophagitis: Secondary | ICD-10-CM | POA: Diagnosis not present

## 2022-06-25 DIAGNOSIS — I6521 Occlusion and stenosis of right carotid artery: Secondary | ICD-10-CM | POA: Diagnosis not present

## 2022-06-25 DIAGNOSIS — I10 Essential (primary) hypertension: Secondary | ICD-10-CM | POA: Diagnosis not present

## 2022-06-25 DIAGNOSIS — I2510 Atherosclerotic heart disease of native coronary artery without angina pectoris: Secondary | ICD-10-CM | POA: Diagnosis not present

## 2022-06-25 DIAGNOSIS — D508 Other iron deficiency anemias: Secondary | ICD-10-CM | POA: Diagnosis not present

## 2022-06-30 DIAGNOSIS — I4892 Unspecified atrial flutter: Secondary | ICD-10-CM | POA: Diagnosis not present

## 2022-06-30 DIAGNOSIS — I10 Essential (primary) hypertension: Secondary | ICD-10-CM | POA: Diagnosis not present

## 2022-06-30 DIAGNOSIS — K219 Gastro-esophageal reflux disease without esophagitis: Secondary | ICD-10-CM | POA: Diagnosis not present

## 2022-06-30 DIAGNOSIS — N189 Chronic kidney disease, unspecified: Secondary | ICD-10-CM | POA: Diagnosis not present

## 2022-06-30 DIAGNOSIS — I2510 Atherosclerotic heart disease of native coronary artery without angina pectoris: Secondary | ICD-10-CM | POA: Diagnosis not present

## 2022-06-30 DIAGNOSIS — J302 Other seasonal allergic rhinitis: Secondary | ICD-10-CM | POA: Diagnosis not present

## 2022-06-30 DIAGNOSIS — I6521 Occlusion and stenosis of right carotid artery: Secondary | ICD-10-CM | POA: Diagnosis not present

## 2022-06-30 DIAGNOSIS — E119 Type 2 diabetes mellitus without complications: Secondary | ICD-10-CM | POA: Diagnosis not present

## 2022-06-30 DIAGNOSIS — D508 Other iron deficiency anemias: Secondary | ICD-10-CM | POA: Diagnosis not present

## 2022-07-03 DIAGNOSIS — I10 Essential (primary) hypertension: Secondary | ICD-10-CM | POA: Diagnosis not present

## 2022-07-06 ENCOUNTER — Other Ambulatory Visit: Payer: Self-pay

## 2022-07-06 ENCOUNTER — Encounter (HOSPITAL_COMMUNITY): Payer: Self-pay

## 2022-07-06 ENCOUNTER — Emergency Department (HOSPITAL_COMMUNITY): Payer: Medicare Other

## 2022-07-06 ENCOUNTER — Emergency Department (HOSPITAL_COMMUNITY)
Admission: EM | Admit: 2022-07-06 | Discharge: 2022-07-06 | Disposition: A | Discharge status: Home / Self Care | Payer: Medicare Other | Attending: Emergency Medicine | Admitting: Emergency Medicine

## 2022-07-06 DIAGNOSIS — I700 Atherosclerosis of aorta: Secondary | ICD-10-CM | POA: Insufficient documentation

## 2022-07-06 DIAGNOSIS — S6992XA Unspecified injury of left wrist, hand and finger(s), initial encounter: Secondary | ICD-10-CM | POA: Diagnosis present

## 2022-07-06 DIAGNOSIS — E119 Type 2 diabetes mellitus without complications: Secondary | ICD-10-CM | POA: Insufficient documentation

## 2022-07-06 DIAGNOSIS — Z794 Long term (current) use of insulin: Secondary | ICD-10-CM | POA: Insufficient documentation

## 2022-07-06 DIAGNOSIS — S61512A Laceration without foreign body of left wrist, initial encounter: Secondary | ICD-10-CM | POA: Diagnosis not present

## 2022-07-06 DIAGNOSIS — S0990XA Unspecified injury of head, initial encounter: Secondary | ICD-10-CM | POA: Insufficient documentation

## 2022-07-06 DIAGNOSIS — W19XXXA Unspecified fall, initial encounter: Secondary | ICD-10-CM | POA: Diagnosis not present

## 2022-07-06 DIAGNOSIS — Z7901 Long term (current) use of anticoagulants: Secondary | ICD-10-CM | POA: Insufficient documentation

## 2022-07-06 DIAGNOSIS — S40212A Abrasion of left shoulder, initial encounter: Secondary | ICD-10-CM | POA: Insufficient documentation

## 2022-07-06 DIAGNOSIS — Z79899 Other long term (current) drug therapy: Secondary | ICD-10-CM | POA: Insufficient documentation

## 2022-07-06 DIAGNOSIS — S42402A Unspecified fracture of lower end of left humerus, initial encounter for closed fracture: Secondary | ICD-10-CM | POA: Diagnosis not present

## 2022-07-06 DIAGNOSIS — I10 Essential (primary) hypertension: Secondary | ICD-10-CM | POA: Diagnosis not present

## 2022-07-06 DIAGNOSIS — W1839XA Other fall on same level, initial encounter: Secondary | ICD-10-CM | POA: Diagnosis not present

## 2022-07-06 DIAGNOSIS — Z7902 Long term (current) use of antithrombotics/antiplatelets: Secondary | ICD-10-CM | POA: Insufficient documentation

## 2022-07-06 DIAGNOSIS — I2510 Atherosclerotic heart disease of native coronary artery without angina pectoris: Secondary | ICD-10-CM | POA: Diagnosis not present

## 2022-07-06 DIAGNOSIS — Y92002 Bathroom of unspecified non-institutional (private) residence single-family (private) house as the place of occurrence of the external cause: Secondary | ICD-10-CM | POA: Diagnosis not present

## 2022-07-06 DIAGNOSIS — R58 Hemorrhage, not elsewhere classified: Secondary | ICD-10-CM | POA: Diagnosis not present

## 2022-07-06 DIAGNOSIS — M25519 Pain in unspecified shoulder: Secondary | ICD-10-CM | POA: Diagnosis not present

## 2022-07-06 MED ORDER — ACETAMINOPHEN 325 MG PO TABS
650.0000 mg | ORAL_TABLET | Freq: Once | ORAL | Status: AC
  Administered 2022-07-06: 650 mg via ORAL
  Filled 2022-07-06: qty 2

## 2022-07-06 MED ORDER — TRIPLE ANTIBIOTIC 3.5-400-5000 EX OINT
1.0000 | TOPICAL_OINTMENT | Freq: Once | CUTANEOUS | Status: AC
  Administered 2022-07-06: 1 via CUTANEOUS
  Filled 2022-07-06: qty 1

## 2022-07-06 NOTE — ED Notes (Addendum)
AVS provided to and discussed with patient and family member at bedside. Pt verbalizes understanding of discharge instructions and denies any questions or concerns at this time. Pt has ride home. Pt taken out of department via W/C to daughter's vehicle.

## 2022-07-06 NOTE — ED Triage Notes (Addendum)
Pt BIB [facility] from ALF ([facility]) for mechanical fall in bathroom resulting in L shoulder pain and skin tears to L arm. Denies hitting head, LOC. Per med list from facility, takes Plavix.

## 2022-07-06 NOTE — Discharge Instructions (Signed)
Keep your wound clean and dry as it continues to heal.  The Steri-Strips will peel away on their own I do not recommend pulling them.  Your x-rays today are reassuring there does not appear to be a new fracture or other injury about your left shoulder or shoulder blade.  You are placed in a sling for comfort of this extremity however.  I recommend following up with your primary doctor for recheck if your symptoms are not improving with a little bit of time, also recommend ice packs as much as is comfortable to your sites of pain for the next several days.  Also recommend taking your already prescribed Tylenol every 6 hours if needed for pain relief.

## 2022-07-06 NOTE — ED Notes (Signed)
Skin tear to L wrist/LFA cleaned with sterile water and gauze pads, steri strips applied. Abrasion to L posterior shoulder cleansed, bacitracin applied. Dressing placed to L posterior shoulder with telfa pads and tape.

## 2022-07-06 NOTE — ED Notes (Signed)
Attempted to call report to facility, no answer.  

## 2022-07-06 NOTE — ED Provider Notes (Addendum)
[place] [facility]ERGENCY DEPARTMENT AT [facility] Provider Note   CSN: [id number] Arrival date & time: 07/06/22  1914     History  Chief Complaint  Patient presents with   Marletta Lor    [name] [name] is a 89 y.o. male with a history including type 2 diabetes, hypertension, CAD, MI, presenting from a local nursing home where he had a mechanical fall in his bathroom.  He sustained injury to his left shoulder and has some skin tears on his left arm.  He denies hitting his head and denies any other pain with this fall.  Of note he is on Plavix.  He has had no treatment prior to arrival.  The history is provided by the patient and the nursing home. The history is limited by the condition of the patient.       Home Medications Prior to Admission medications   Medication Sig Start Date End Date Taking? Authorizing Provider  acetaminophen (TYLENOL) 325 MG tablet Take 650 mg by mouth every 6 (six) hours as needed (for pain).    [provider]  albuterol (VENTOLIN HFA) 108 (90 Base) MCG/ACT inhaler Inhale 2 puffs into the lungs every 4 (four) hours as needed for wheezing or shortness of breath.    [provider]  amLODipine (NORVASC) 5 MG tablet Take 5 mg by mouth daily.  11/12/17   [provider]  cholestyramine (QUESTRAN) 4 g packet Take 1 packet by mouth 2 (two) times daily. 11/08/21   [provider]  clopidogrel (PLAVIX) 75 MG tablet Take 1 tablet (75 mg total) by mouth daily. 01/31/14   [name], MD  diphenoxylate-atropine (LOMOTIL) 2.5-0.025 MG tablet Take 1 tablet by mouth 4 (four) times daily as needed for diarrhea or loose stools. 04/17/22   Osvaldo Shipper, MD  escitalopram (LEXAPRO) 10 MG tablet Take 10 mg by mouth daily. 12/02/21   [provider]  ferrous sulfate 325 (65 FE) MG EC tablet Take 325 mg by mouth daily with breakfast.     [provider]  irbesartan-hydrochlorothiazide (AVALIDE) 300-12.5 MG per tablet Take 1  tablet by mouth daily. 01/31/14   [name], MD  LANTUS SOLOSTAR 100 UNIT/ML Solostar Pen Inject 7 Units into the skin at bedtime. 02/06/22   Tyrone Nine, MD  loratadine (CLARITIN) 10 MG tablet Take 10 mg by mouth daily.    [provider]  Magnesium 500 MG TABS Take 500 mg by mouth in the morning.    [provider]  melatonin 5 MG TABS Take 5 mg by mouth at bedtime.    [provider]  pantoprazole (PROTONIX) 40 MG tablet Take 1 tablet (40 mg total) by mouth daily. 02/06/22   Tyrone Nine, MD  predniSONE (DELTASONE) 20 MG tablet Take 3 tablets once daily for 3 days followed by 2 tablets once daily for 3 days followed by 1 tablet once daily for 3 days and then stop 04/17/22   Osvaldo Shipper, MD  rosuvastatin (CRESTOR) 5 MG tablet Take 5 mg by mouth at bedtime. 11/12/17   [provider]  sitaGLIPtin (JANUVIA) 100 MG tablet Take 100 mg by mouth in the morning.    [provider]      Allergies    Patient has no known allergies.    Review of Systems   Review of Systems  Constitutional:  Negative for fever.  Musculoskeletal:  Positive for arthralgias. Negative for joint swelling and myalgias.  Skin:  Positive for  wound.  Neurological:  Negative for weakness and numbness.  All other systems reviewed and are negative.   Physical Exam Updated Vital Signs BP (!) 140/59   Pulse (!) 53   Temp 97.9 F (36.6 C) (Oral)   Resp 16   Ht 5\' 3"  (1.6 m)   Wt 79.4 kg   SpO2 98%   BMI 31.00 kg/m  Physical Exam Constitutional:      Appearance: Normal appearance. He is well-developed.  HENT:     Head: Normocephalic and atraumatic.  Cardiovascular:     Rate and Rhythm: Normal rate.     Comments: Pulses equal bilaterally Pulmonary:     Effort: Pulmonary effort is normal.  Abdominal:     Tenderness: There is no abdominal tenderness.  Musculoskeletal:        General: Tenderness present.     Left shoulder: Bony tenderness present.     Cervical  back: Normal range of motion.     Comments: Patient is significant tenderness along his left scapula, there is no palpable deformities, there is however a long linear abrasion along the length of his left mid back.  No hematoma appreciated, no bruising.  Parent chronic deformity at the left elbow.  He is nontender to palpation around his left shoulder upper arm and forearm.  No significant wrist or hand pain either.  He does have a 2 cm skin tear, 3 corner at his left lateral wrist.  This is hemostatic.  Lymphadenopathy:     Cervical: No cervical adenopathy.  Skin:    General: Skin is warm and dry.  Neurological:     Mental Status: He is alert.     Sensory: No sensory deficit.     Motor: No weakness.     Deep Tendon Reflexes: Reflexes normal.     ED Results / Procedures / Treatments   Labs (all labs ordered are listed, but only abnormal results are displayed) Labs Reviewed - No data to display  EKG None  Radiology DG Shoulder Left  Result Date: 07/06/2022 CLINICAL DATA:  Fall EXAM: LEFT SHOULDER - 2+ VIEW COMPARISON:  None Available. FINDINGS: Chronic fracture deformity of the distal humerus and elbow. No acute fracture or dislocation. Aortic Atherosclerosis (ICD10-170.0). IMPRESSION: Chronic fracture deformity of the distal humerus and elbow. No acute findings. Electronically Signed   By: Corlis Leak M.D.   On: 07/06/2022 11:13   CT Head Wo Contrast  Result Date: 07/06/2022 CLINICAL DATA:  Fall in bathroom today.  Minor head trauma. EXAM: CT HEAD WITHOUT CONTRAST TECHNIQUE: Contiguous axial images were obtained from the base of the skull through the vertex without intravenous contrast. RADIATION DOSE REDUCTION: This exam was performed according to the departmental dose-optimization program which includes automated exposure control, adjustment of the mA and/or kV according to patient size and/or use of iterative reconstruction technique. COMPARISON:  01/27/2022 FINDINGS: Brain: No  evidence of intracranial hemorrhage, acute infarction, hydrocephalus, extra-axial collection, or mass lesion/mass effect. Mild diffuse cerebral atrophy and severe chronic small vessel disease show no significant change. Vascular:  No hyperdense vessel or other acute findings. Skull: No evidence of fracture or other significant bone abnormality. Sinuses/Orbits:  No acute findings. Other: None. IMPRESSION: No acute intracranial abnormality. Stable mild cerebral atrophy and severe chronic small vessel disease. Electronically Signed   By: Danae Orleans M.D.   On: 07/06/2022 10:59    Procedures Procedures    LACERATION REPAIR Performed by: nursing staff Authorized by: Burgess Amor Consent: Verbal consent obtained. Risks  and benefits: risks, benefits and alternatives were discussed Consent given by: patient Patient identity confirmed: provided demographic data Prepped and Draped in normal sterile fashion Wound explored  Laceration Location: left lateral wrist  Laceration Length: 2cm, superficial, skin tear  No Foreign Bodies seen or palpated  Anesthesia:n/a Local anesthetic:n/a  Anesthetic total: 0 ml  Irrigation method: syringe Amount of cleaning: standard  Skin closure: sterile strips  Number of sutures: sterile strips  Technique: sterile strips  Patient tolerance: Patient tolerated the procedure well with no immediate complications.  Back abrasion was given wound care, Neosporin and Telfa dressings.   Medications Ordered in ED Medications  acetaminophen (TYLENOL) tablet 650 mg (650 mg Oral Given 07/06/22 1146)  neomycin-bacitracin-polymyxin [id number] OINT 1 Application (1 Application Apply externally Given 07/06/22 1147)    ED Course/ Medical Decision Making/ A&P                             Medical Decision Making Patient presenting with a fall from his nursing home, sustaining abrasion to his left upper back and skin tear to the left forearm.  Denies head injury but is  on Plavix, scanning completed with results below.  Amount and/or Complexity of Data Reviewed Radiology: ordered.    Details: Left shoulder negative for acute fracture or dislocation, scapula appears intact.  CT head negative for acute subdural or epidural.  Risk OTC drugs.      Spoke with Luther Bradley,  dg,  healthcare POA prior to dc home with findings and plan.      Final Clinical Impression(s) / ED Diagnoses Final diagnoses:  Fall, initial encounter  Abrasion of left scapular region, initial encounter  Tear of skin of left wrist, initial encounter    Rx / DC Orders ED Discharge Orders     None         Victoriano Lain 07/06/22 1432    Burgess Amor, PA-C 07/06/22 1626    Bethann Berkshire, MD 07/07/22 1141

## 2022-07-07 DIAGNOSIS — K589 Irritable bowel syndrome without diarrhea: Secondary | ICD-10-CM | POA: Diagnosis not present

## 2022-07-07 DIAGNOSIS — E782 Mixed hyperlipidemia: Secondary | ICD-10-CM | POA: Diagnosis not present

## 2022-07-07 DIAGNOSIS — D649 Anemia, unspecified: Secondary | ICD-10-CM | POA: Diagnosis not present

## 2022-07-07 DIAGNOSIS — K219 Gastro-esophageal reflux disease without esophagitis: Secondary | ICD-10-CM | POA: Diagnosis not present

## 2022-07-07 DIAGNOSIS — Z7951 Long term (current) use of inhaled steroids: Secondary | ICD-10-CM | POA: Diagnosis not present

## 2022-07-07 DIAGNOSIS — M25512 Pain in left shoulder: Secondary | ICD-10-CM | POA: Diagnosis not present

## 2022-07-07 DIAGNOSIS — Z9181 History of falling: Secondary | ICD-10-CM | POA: Diagnosis not present

## 2022-07-07 DIAGNOSIS — J449 Chronic obstructive pulmonary disease, unspecified: Secondary | ICD-10-CM | POA: Diagnosis not present

## 2022-07-07 DIAGNOSIS — I119 Hypertensive heart disease without heart failure: Secondary | ICD-10-CM | POA: Diagnosis not present

## 2022-07-07 DIAGNOSIS — E119 Type 2 diabetes mellitus without complications: Secondary | ICD-10-CM | POA: Diagnosis not present

## 2022-07-07 DIAGNOSIS — Z794 Long term (current) use of insulin: Secondary | ICD-10-CM | POA: Diagnosis not present

## 2022-07-07 DIAGNOSIS — M6281 Muscle weakness (generalized): Secondary | ICD-10-CM | POA: Diagnosis not present

## 2022-07-18 DIAGNOSIS — E559 Vitamin D deficiency, unspecified: Secondary | ICD-10-CM | POA: Diagnosis not present

## 2022-07-18 DIAGNOSIS — I10 Essential (primary) hypertension: Secondary | ICD-10-CM | POA: Diagnosis not present

## 2022-07-18 DIAGNOSIS — E039 Hypothyroidism, unspecified: Secondary | ICD-10-CM | POA: Diagnosis not present

## 2022-07-18 DIAGNOSIS — E119 Type 2 diabetes mellitus without complications: Secondary | ICD-10-CM | POA: Diagnosis not present

## 2022-07-18 DIAGNOSIS — E782 Mixed hyperlipidemia: Secondary | ICD-10-CM | POA: Diagnosis not present

## 2022-07-21 DIAGNOSIS — F330 Major depressive disorder, recurrent, mild: Secondary | ICD-10-CM | POA: Diagnosis not present

## 2022-07-21 DIAGNOSIS — F411 Generalized anxiety disorder: Secondary | ICD-10-CM | POA: Diagnosis not present

## 2022-07-21 DIAGNOSIS — F4323 Adjustment disorder with mixed anxiety and depressed mood: Secondary | ICD-10-CM | POA: Diagnosis not present

## 2022-07-21 DIAGNOSIS — F5105 Insomnia due to other mental disorder: Secondary | ICD-10-CM | POA: Diagnosis not present

## 2022-07-22 DIAGNOSIS — E119 Type 2 diabetes mellitus without complications: Secondary | ICD-10-CM | POA: Diagnosis not present

## 2022-07-22 DIAGNOSIS — M6281 Muscle weakness (generalized): Secondary | ICD-10-CM | POA: Diagnosis not present

## 2022-07-22 DIAGNOSIS — M4802 Spinal stenosis, cervical region: Secondary | ICD-10-CM | POA: Diagnosis not present

## 2022-07-22 DIAGNOSIS — R262 Difficulty in walking, not elsewhere classified: Secondary | ICD-10-CM | POA: Diagnosis not present

## 2022-07-22 DIAGNOSIS — K219 Gastro-esophageal reflux disease without esophagitis: Secondary | ICD-10-CM | POA: Diagnosis not present

## 2022-07-22 DIAGNOSIS — J441 Chronic obstructive pulmonary disease with (acute) exacerbation: Secondary | ICD-10-CM | POA: Diagnosis not present

## 2022-07-22 DIAGNOSIS — I10 Essential (primary) hypertension: Secondary | ICD-10-CM | POA: Diagnosis not present

## 2022-07-25 DIAGNOSIS — M6281 Muscle weakness (generalized): Secondary | ICD-10-CM | POA: Diagnosis not present

## 2022-07-25 DIAGNOSIS — R262 Difficulty in walking, not elsewhere classified: Secondary | ICD-10-CM | POA: Diagnosis not present

## 2022-07-28 DIAGNOSIS — E559 Vitamin D deficiency, unspecified: Secondary | ICD-10-CM | POA: Diagnosis not present

## 2022-07-28 DIAGNOSIS — E1169 Type 2 diabetes mellitus with other specified complication: Secondary | ICD-10-CM | POA: Diagnosis not present

## 2022-07-28 DIAGNOSIS — J309 Allergic rhinitis, unspecified: Secondary | ICD-10-CM | POA: Diagnosis not present

## 2022-07-28 DIAGNOSIS — K529 Noninfective gastroenteritis and colitis, unspecified: Secondary | ICD-10-CM | POA: Diagnosis not present

## 2022-07-28 DIAGNOSIS — I959 Hypotension, unspecified: Secondary | ICD-10-CM | POA: Diagnosis not present

## 2022-07-28 DIAGNOSIS — K915 Postcholecystectomy syndrome: Secondary | ICD-10-CM | POA: Diagnosis not present

## 2022-07-28 DIAGNOSIS — Z79899 Other long term (current) drug therapy: Secondary | ICD-10-CM | POA: Diagnosis not present

## 2022-07-28 DIAGNOSIS — R42 Dizziness and giddiness: Secondary | ICD-10-CM | POA: Diagnosis not present

## 2022-07-29 DIAGNOSIS — E119 Type 2 diabetes mellitus without complications: Secondary | ICD-10-CM | POA: Diagnosis not present

## 2022-07-29 DIAGNOSIS — K219 Gastro-esophageal reflux disease without esophagitis: Secondary | ICD-10-CM | POA: Diagnosis not present

## 2022-07-29 DIAGNOSIS — M4802 Spinal stenosis, cervical region: Secondary | ICD-10-CM | POA: Diagnosis not present

## 2022-07-29 DIAGNOSIS — M6281 Muscle weakness (generalized): Secondary | ICD-10-CM | POA: Diagnosis not present

## 2022-07-29 DIAGNOSIS — J441 Chronic obstructive pulmonary disease with (acute) exacerbation: Secondary | ICD-10-CM | POA: Diagnosis not present

## 2022-07-29 DIAGNOSIS — I10 Essential (primary) hypertension: Secondary | ICD-10-CM | POA: Diagnosis not present

## 2022-07-29 DIAGNOSIS — R262 Difficulty in walking, not elsewhere classified: Secondary | ICD-10-CM | POA: Diagnosis not present

## 2022-07-31 DIAGNOSIS — K219 Gastro-esophageal reflux disease without esophagitis: Secondary | ICD-10-CM | POA: Diagnosis not present

## 2022-07-31 DIAGNOSIS — I10 Essential (primary) hypertension: Secondary | ICD-10-CM | POA: Diagnosis not present

## 2022-07-31 DIAGNOSIS — R262 Difficulty in walking, not elsewhere classified: Secondary | ICD-10-CM | POA: Diagnosis not present

## 2022-07-31 DIAGNOSIS — E119 Type 2 diabetes mellitus without complications: Secondary | ICD-10-CM | POA: Diagnosis not present

## 2022-07-31 DIAGNOSIS — M4802 Spinal stenosis, cervical region: Secondary | ICD-10-CM | POA: Diagnosis not present

## 2022-07-31 DIAGNOSIS — M6281 Muscle weakness (generalized): Secondary | ICD-10-CM | POA: Diagnosis not present

## 2022-07-31 DIAGNOSIS — J441 Chronic obstructive pulmonary disease with (acute) exacerbation: Secondary | ICD-10-CM | POA: Diagnosis not present

## 2022-08-01 DIAGNOSIS — M6281 Muscle weakness (generalized): Secondary | ICD-10-CM | POA: Diagnosis not present

## 2022-08-01 DIAGNOSIS — R262 Difficulty in walking, not elsewhere classified: Secondary | ICD-10-CM | POA: Diagnosis not present

## 2022-08-04 DIAGNOSIS — K580 Irritable bowel syndrome with diarrhea: Secondary | ICD-10-CM | POA: Diagnosis not present

## 2022-08-04 DIAGNOSIS — J449 Chronic obstructive pulmonary disease, unspecified: Secondary | ICD-10-CM | POA: Diagnosis not present

## 2022-08-04 DIAGNOSIS — I119 Hypertensive heart disease without heart failure: Secondary | ICD-10-CM | POA: Diagnosis not present

## 2022-08-05 DIAGNOSIS — J441 Chronic obstructive pulmonary disease with (acute) exacerbation: Secondary | ICD-10-CM | POA: Diagnosis not present

## 2022-08-05 DIAGNOSIS — R262 Difficulty in walking, not elsewhere classified: Secondary | ICD-10-CM | POA: Diagnosis not present

## 2022-08-05 DIAGNOSIS — M6281 Muscle weakness (generalized): Secondary | ICD-10-CM | POA: Diagnosis not present

## 2022-08-05 DIAGNOSIS — M4802 Spinal stenosis, cervical region: Secondary | ICD-10-CM | POA: Diagnosis not present

## 2022-08-05 DIAGNOSIS — E119 Type 2 diabetes mellitus without complications: Secondary | ICD-10-CM | POA: Diagnosis not present

## 2022-08-05 DIAGNOSIS — I10 Essential (primary) hypertension: Secondary | ICD-10-CM | POA: Diagnosis not present

## 2022-08-05 DIAGNOSIS — K219 Gastro-esophageal reflux disease without esophagitis: Secondary | ICD-10-CM | POA: Diagnosis not present

## 2022-08-06 DIAGNOSIS — M6281 Muscle weakness (generalized): Secondary | ICD-10-CM | POA: Diagnosis not present

## 2022-08-06 DIAGNOSIS — R262 Difficulty in walking, not elsewhere classified: Secondary | ICD-10-CM | POA: Diagnosis not present

## 2022-08-07 DIAGNOSIS — E119 Type 2 diabetes mellitus without complications: Secondary | ICD-10-CM | POA: Diagnosis not present

## 2022-08-07 DIAGNOSIS — K219 Gastro-esophageal reflux disease without esophagitis: Secondary | ICD-10-CM | POA: Diagnosis not present

## 2022-08-07 DIAGNOSIS — R262 Difficulty in walking, not elsewhere classified: Secondary | ICD-10-CM | POA: Diagnosis not present

## 2022-08-07 DIAGNOSIS — I10 Essential (primary) hypertension: Secondary | ICD-10-CM | POA: Diagnosis not present

## 2022-08-07 DIAGNOSIS — M4802 Spinal stenosis, cervical region: Secondary | ICD-10-CM | POA: Diagnosis not present

## 2022-08-07 DIAGNOSIS — J441 Chronic obstructive pulmonary disease with (acute) exacerbation: Secondary | ICD-10-CM | POA: Diagnosis not present

## 2022-08-07 DIAGNOSIS — M6281 Muscle weakness (generalized): Secondary | ICD-10-CM | POA: Diagnosis not present

## 2022-08-11 DIAGNOSIS — K580 Irritable bowel syndrome with diarrhea: Secondary | ICD-10-CM | POA: Diagnosis not present

## 2022-08-11 DIAGNOSIS — R350 Frequency of micturition: Secondary | ICD-10-CM | POA: Diagnosis not present

## 2022-08-11 DIAGNOSIS — R42 Dizziness and giddiness: Secondary | ICD-10-CM | POA: Diagnosis not present

## 2022-08-11 DIAGNOSIS — N390 Urinary tract infection, site not specified: Secondary | ICD-10-CM | POA: Diagnosis not present

## 2022-08-12 DIAGNOSIS — I10 Essential (primary) hypertension: Secondary | ICD-10-CM | POA: Diagnosis not present

## 2022-08-12 DIAGNOSIS — J441 Chronic obstructive pulmonary disease with (acute) exacerbation: Secondary | ICD-10-CM | POA: Diagnosis not present

## 2022-08-12 DIAGNOSIS — M4802 Spinal stenosis, cervical region: Secondary | ICD-10-CM | POA: Diagnosis not present

## 2022-08-12 DIAGNOSIS — K219 Gastro-esophageal reflux disease without esophagitis: Secondary | ICD-10-CM | POA: Diagnosis not present

## 2022-08-12 DIAGNOSIS — E119 Type 2 diabetes mellitus without complications: Secondary | ICD-10-CM | POA: Diagnosis not present

## 2022-08-12 DIAGNOSIS — M6281 Muscle weakness (generalized): Secondary | ICD-10-CM | POA: Diagnosis not present

## 2022-08-12 DIAGNOSIS — R262 Difficulty in walking, not elsewhere classified: Secondary | ICD-10-CM | POA: Diagnosis not present

## 2022-08-13 DIAGNOSIS — R262 Difficulty in walking, not elsewhere classified: Secondary | ICD-10-CM | POA: Diagnosis not present

## 2022-08-13 DIAGNOSIS — M6281 Muscle weakness (generalized): Secondary | ICD-10-CM | POA: Diagnosis not present

## 2022-08-14 DIAGNOSIS — K219 Gastro-esophageal reflux disease without esophagitis: Secondary | ICD-10-CM | POA: Diagnosis not present

## 2022-08-14 DIAGNOSIS — I10 Essential (primary) hypertension: Secondary | ICD-10-CM | POA: Diagnosis not present

## 2022-08-14 DIAGNOSIS — M6281 Muscle weakness (generalized): Secondary | ICD-10-CM | POA: Diagnosis not present

## 2022-08-14 DIAGNOSIS — J441 Chronic obstructive pulmonary disease with (acute) exacerbation: Secondary | ICD-10-CM | POA: Diagnosis not present

## 2022-08-14 DIAGNOSIS — E119 Type 2 diabetes mellitus without complications: Secondary | ICD-10-CM | POA: Diagnosis not present

## 2022-08-14 DIAGNOSIS — R262 Difficulty in walking, not elsewhere classified: Secondary | ICD-10-CM | POA: Diagnosis not present

## 2022-08-14 DIAGNOSIS — M4802 Spinal stenosis, cervical region: Secondary | ICD-10-CM | POA: Diagnosis not present

## 2022-08-16 DIAGNOSIS — R262 Difficulty in walking, not elsewhere classified: Secondary | ICD-10-CM | POA: Diagnosis not present

## 2022-08-16 DIAGNOSIS — M6281 Muscle weakness (generalized): Secondary | ICD-10-CM | POA: Diagnosis not present

## 2022-08-17 DIAGNOSIS — R262 Difficulty in walking, not elsewhere classified: Secondary | ICD-10-CM | POA: Diagnosis not present

## 2022-08-17 DIAGNOSIS — M6281 Muscle weakness (generalized): Secondary | ICD-10-CM | POA: Diagnosis not present

## 2022-08-18 DIAGNOSIS — N390 Urinary tract infection, site not specified: Secondary | ICD-10-CM | POA: Diagnosis not present

## 2022-08-18 DIAGNOSIS — F411 Generalized anxiety disorder: Secondary | ICD-10-CM | POA: Diagnosis not present

## 2022-08-18 DIAGNOSIS — F4323 Adjustment disorder with mixed anxiety and depressed mood: Secondary | ICD-10-CM | POA: Diagnosis not present

## 2022-08-18 DIAGNOSIS — F330 Major depressive disorder, recurrent, mild: Secondary | ICD-10-CM | POA: Diagnosis not present

## 2022-08-18 DIAGNOSIS — E1169 Type 2 diabetes mellitus with other specified complication: Secondary | ICD-10-CM | POA: Diagnosis not present

## 2022-08-18 DIAGNOSIS — K219 Gastro-esophageal reflux disease without esophagitis: Secondary | ICD-10-CM | POA: Diagnosis not present

## 2022-08-18 DIAGNOSIS — R262 Difficulty in walking, not elsewhere classified: Secondary | ICD-10-CM | POA: Diagnosis not present

## 2022-08-18 DIAGNOSIS — M6281 Muscle weakness (generalized): Secondary | ICD-10-CM | POA: Diagnosis not present

## 2022-08-18 DIAGNOSIS — F5105 Insomnia due to other mental disorder: Secondary | ICD-10-CM | POA: Diagnosis not present

## 2022-08-19 DIAGNOSIS — I10 Essential (primary) hypertension: Secondary | ICD-10-CM | POA: Diagnosis not present

## 2022-08-19 DIAGNOSIS — M6281 Muscle weakness (generalized): Secondary | ICD-10-CM | POA: Diagnosis not present

## 2022-08-19 DIAGNOSIS — E119 Type 2 diabetes mellitus without complications: Secondary | ICD-10-CM | POA: Diagnosis not present

## 2022-08-19 DIAGNOSIS — M4802 Spinal stenosis, cervical region: Secondary | ICD-10-CM | POA: Diagnosis not present

## 2022-08-19 DIAGNOSIS — K219 Gastro-esophageal reflux disease without esophagitis: Secondary | ICD-10-CM | POA: Diagnosis not present

## 2022-08-19 DIAGNOSIS — J441 Chronic obstructive pulmonary disease with (acute) exacerbation: Secondary | ICD-10-CM | POA: Diagnosis not present

## 2022-08-19 DIAGNOSIS — R262 Difficulty in walking, not elsewhere classified: Secondary | ICD-10-CM | POA: Diagnosis not present

## 2022-08-21 DIAGNOSIS — K219 Gastro-esophageal reflux disease without esophagitis: Secondary | ICD-10-CM | POA: Diagnosis not present

## 2022-08-21 DIAGNOSIS — E119 Type 2 diabetes mellitus without complications: Secondary | ICD-10-CM | POA: Diagnosis not present

## 2022-08-21 DIAGNOSIS — R262 Difficulty in walking, not elsewhere classified: Secondary | ICD-10-CM | POA: Diagnosis not present

## 2022-08-21 DIAGNOSIS — J441 Chronic obstructive pulmonary disease with (acute) exacerbation: Secondary | ICD-10-CM | POA: Diagnosis not present

## 2022-08-21 DIAGNOSIS — I10 Essential (primary) hypertension: Secondary | ICD-10-CM | POA: Diagnosis not present

## 2022-08-21 DIAGNOSIS — M4802 Spinal stenosis, cervical region: Secondary | ICD-10-CM | POA: Diagnosis not present

## 2022-08-21 DIAGNOSIS — M6281 Muscle weakness (generalized): Secondary | ICD-10-CM | POA: Diagnosis not present

## 2022-08-22 DIAGNOSIS — R262 Difficulty in walking, not elsewhere classified: Secondary | ICD-10-CM | POA: Diagnosis not present

## 2022-08-22 DIAGNOSIS — M6281 Muscle weakness (generalized): Secondary | ICD-10-CM | POA: Diagnosis not present

## 2022-09-01 DIAGNOSIS — L989 Disorder of the skin and subcutaneous tissue, unspecified: Secondary | ICD-10-CM | POA: Diagnosis not present

## 2022-09-01 DIAGNOSIS — J449 Chronic obstructive pulmonary disease, unspecified: Secondary | ICD-10-CM | POA: Diagnosis not present

## 2022-09-01 DIAGNOSIS — I119 Hypertensive heart disease without heart failure: Secondary | ICD-10-CM | POA: Diagnosis not present

## 2022-09-02 DIAGNOSIS — I10 Essential (primary) hypertension: Secondary | ICD-10-CM | POA: Diagnosis not present

## 2022-09-02 DIAGNOSIS — M4802 Spinal stenosis, cervical region: Secondary | ICD-10-CM | POA: Diagnosis not present

## 2022-09-02 DIAGNOSIS — M6281 Muscle weakness (generalized): Secondary | ICD-10-CM | POA: Diagnosis not present

## 2022-09-02 DIAGNOSIS — R262 Difficulty in walking, not elsewhere classified: Secondary | ICD-10-CM | POA: Diagnosis not present

## 2022-09-02 DIAGNOSIS — K219 Gastro-esophageal reflux disease without esophagitis: Secondary | ICD-10-CM | POA: Diagnosis not present

## 2022-09-02 DIAGNOSIS — J441 Chronic obstructive pulmonary disease with (acute) exacerbation: Secondary | ICD-10-CM | POA: Diagnosis not present

## 2022-09-02 DIAGNOSIS — E119 Type 2 diabetes mellitus without complications: Secondary | ICD-10-CM | POA: Diagnosis not present

## 2022-09-03 DIAGNOSIS — R262 Difficulty in walking, not elsewhere classified: Secondary | ICD-10-CM | POA: Diagnosis not present

## 2022-09-03 DIAGNOSIS — M6281 Muscle weakness (generalized): Secondary | ICD-10-CM | POA: Diagnosis not present

## 2022-09-04 DIAGNOSIS — J441 Chronic obstructive pulmonary disease with (acute) exacerbation: Secondary | ICD-10-CM | POA: Diagnosis not present

## 2022-09-04 DIAGNOSIS — R262 Difficulty in walking, not elsewhere classified: Secondary | ICD-10-CM | POA: Diagnosis not present

## 2022-09-04 DIAGNOSIS — M4802 Spinal stenosis, cervical region: Secondary | ICD-10-CM | POA: Diagnosis not present

## 2022-09-04 DIAGNOSIS — M6281 Muscle weakness (generalized): Secondary | ICD-10-CM | POA: Diagnosis not present

## 2022-09-04 DIAGNOSIS — K219 Gastro-esophageal reflux disease without esophagitis: Secondary | ICD-10-CM | POA: Diagnosis not present

## 2022-09-04 DIAGNOSIS — E119 Type 2 diabetes mellitus without complications: Secondary | ICD-10-CM | POA: Diagnosis not present

## 2022-09-04 DIAGNOSIS — I10 Essential (primary) hypertension: Secondary | ICD-10-CM | POA: Diagnosis not present

## 2022-09-05 DIAGNOSIS — M6281 Muscle weakness (generalized): Secondary | ICD-10-CM | POA: Diagnosis not present

## 2022-09-05 DIAGNOSIS — R262 Difficulty in walking, not elsewhere classified: Secondary | ICD-10-CM | POA: Diagnosis not present

## 2022-09-09 DIAGNOSIS — R262 Difficulty in walking, not elsewhere classified: Secondary | ICD-10-CM | POA: Diagnosis not present

## 2022-09-09 DIAGNOSIS — K219 Gastro-esophageal reflux disease without esophagitis: Secondary | ICD-10-CM | POA: Diagnosis not present

## 2022-09-09 DIAGNOSIS — M6281 Muscle weakness (generalized): Secondary | ICD-10-CM | POA: Diagnosis not present

## 2022-09-09 DIAGNOSIS — L989 Disorder of the skin and subcutaneous tissue, unspecified: Secondary | ICD-10-CM | POA: Diagnosis not present

## 2022-09-09 DIAGNOSIS — E119 Type 2 diabetes mellitus without complications: Secondary | ICD-10-CM | POA: Diagnosis not present

## 2022-09-09 DIAGNOSIS — J441 Chronic obstructive pulmonary disease with (acute) exacerbation: Secondary | ICD-10-CM | POA: Diagnosis not present

## 2022-09-09 DIAGNOSIS — M4802 Spinal stenosis, cervical region: Secondary | ICD-10-CM | POA: Diagnosis not present

## 2022-09-09 DIAGNOSIS — I10 Essential (primary) hypertension: Secondary | ICD-10-CM | POA: Diagnosis not present

## 2022-09-09 DIAGNOSIS — L03031 Cellulitis of right toe: Secondary | ICD-10-CM | POA: Diagnosis not present

## 2022-09-11 DIAGNOSIS — J441 Chronic obstructive pulmonary disease with (acute) exacerbation: Secondary | ICD-10-CM | POA: Diagnosis not present

## 2022-09-11 DIAGNOSIS — E119 Type 2 diabetes mellitus without complications: Secondary | ICD-10-CM | POA: Diagnosis not present

## 2022-09-11 DIAGNOSIS — M4802 Spinal stenosis, cervical region: Secondary | ICD-10-CM | POA: Diagnosis not present

## 2022-09-11 DIAGNOSIS — I10 Essential (primary) hypertension: Secondary | ICD-10-CM | POA: Diagnosis not present

## 2022-09-11 DIAGNOSIS — K219 Gastro-esophageal reflux disease without esophagitis: Secondary | ICD-10-CM | POA: Diagnosis not present

## 2022-09-11 DIAGNOSIS — R262 Difficulty in walking, not elsewhere classified: Secondary | ICD-10-CM | POA: Diagnosis not present

## 2022-09-11 DIAGNOSIS — M6281 Muscle weakness (generalized): Secondary | ICD-10-CM | POA: Diagnosis not present

## 2022-09-12 DIAGNOSIS — R262 Difficulty in walking, not elsewhere classified: Secondary | ICD-10-CM | POA: Diagnosis not present

## 2022-09-12 DIAGNOSIS — M6281 Muscle weakness (generalized): Secondary | ICD-10-CM | POA: Diagnosis not present

## 2022-09-15 DIAGNOSIS — M1711 Unilateral primary osteoarthritis, right knee: Secondary | ICD-10-CM | POA: Diagnosis not present

## 2022-09-15 DIAGNOSIS — F4323 Adjustment disorder with mixed anxiety and depressed mood: Secondary | ICD-10-CM | POA: Diagnosis not present

## 2022-09-15 DIAGNOSIS — F5105 Insomnia due to other mental disorder: Secondary | ICD-10-CM | POA: Diagnosis not present

## 2022-09-15 DIAGNOSIS — F17210 Nicotine dependence, cigarettes, uncomplicated: Secondary | ICD-10-CM | POA: Diagnosis not present

## 2022-09-15 DIAGNOSIS — F330 Major depressive disorder, recurrent, mild: Secondary | ICD-10-CM | POA: Diagnosis not present

## 2022-09-15 DIAGNOSIS — E782 Mixed hyperlipidemia: Secondary | ICD-10-CM | POA: Diagnosis not present

## 2022-09-15 DIAGNOSIS — F411 Generalized anxiety disorder: Secondary | ICD-10-CM | POA: Diagnosis not present

## 2022-09-15 DIAGNOSIS — E1169 Type 2 diabetes mellitus with other specified complication: Secondary | ICD-10-CM | POA: Diagnosis not present

## 2022-09-16 DIAGNOSIS — I10 Essential (primary) hypertension: Secondary | ICD-10-CM | POA: Diagnosis not present

## 2022-09-16 DIAGNOSIS — E119 Type 2 diabetes mellitus without complications: Secondary | ICD-10-CM | POA: Diagnosis not present

## 2022-09-16 DIAGNOSIS — M6281 Muscle weakness (generalized): Secondary | ICD-10-CM | POA: Diagnosis not present

## 2022-09-16 DIAGNOSIS — R262 Difficulty in walking, not elsewhere classified: Secondary | ICD-10-CM | POA: Diagnosis not present

## 2022-09-16 DIAGNOSIS — K219 Gastro-esophageal reflux disease without esophagitis: Secondary | ICD-10-CM | POA: Diagnosis not present

## 2022-09-16 DIAGNOSIS — J441 Chronic obstructive pulmonary disease with (acute) exacerbation: Secondary | ICD-10-CM | POA: Diagnosis not present

## 2022-09-16 DIAGNOSIS — M4802 Spinal stenosis, cervical region: Secondary | ICD-10-CM | POA: Diagnosis not present

## 2022-09-17 DIAGNOSIS — R262 Difficulty in walking, not elsewhere classified: Secondary | ICD-10-CM | POA: Diagnosis not present

## 2022-09-17 DIAGNOSIS — M6281 Muscle weakness (generalized): Secondary | ICD-10-CM | POA: Diagnosis not present

## 2022-09-18 DIAGNOSIS — R262 Difficulty in walking, not elsewhere classified: Secondary | ICD-10-CM | POA: Diagnosis not present

## 2022-09-18 DIAGNOSIS — M6281 Muscle weakness (generalized): Secondary | ICD-10-CM | POA: Diagnosis not present

## 2022-09-19 DIAGNOSIS — R262 Difficulty in walking, not elsewhere classified: Secondary | ICD-10-CM | POA: Diagnosis not present

## 2022-09-19 DIAGNOSIS — M6281 Muscle weakness (generalized): Secondary | ICD-10-CM | POA: Diagnosis not present

## 2022-09-22 DIAGNOSIS — W19XXXA Unspecified fall, initial encounter: Secondary | ICD-10-CM | POA: Diagnosis not present

## 2022-09-22 DIAGNOSIS — M25561 Pain in right knee: Secondary | ICD-10-CM | POA: Diagnosis not present

## 2022-09-22 DIAGNOSIS — M25562 Pain in left knee: Secondary | ICD-10-CM | POA: Diagnosis not present

## 2022-09-22 DIAGNOSIS — M6281 Muscle weakness (generalized): Secondary | ICD-10-CM | POA: Diagnosis not present

## 2022-09-22 DIAGNOSIS — R262 Difficulty in walking, not elsewhere classified: Secondary | ICD-10-CM | POA: Diagnosis not present

## 2022-09-23 DIAGNOSIS — R262 Difficulty in walking, not elsewhere classified: Secondary | ICD-10-CM | POA: Diagnosis not present

## 2022-09-23 DIAGNOSIS — M6281 Muscle weakness (generalized): Secondary | ICD-10-CM | POA: Diagnosis not present

## 2022-09-24 ENCOUNTER — Emergency Department (HOSPITAL_COMMUNITY): Payer: Medicare Other

## 2022-09-24 ENCOUNTER — Encounter (HOSPITAL_COMMUNITY): Payer: Self-pay | Admitting: Radiology

## 2022-09-24 ENCOUNTER — Other Ambulatory Visit: Payer: Self-pay

## 2022-09-24 ENCOUNTER — Observation Stay (HOSPITAL_COMMUNITY)
Admission: EM | Admit: 2022-09-24 | Discharge: 2022-09-25 | Disposition: A | Discharge status: Home / Self Care | Payer: Medicare Other | Attending: Internal Medicine | Admitting: Internal Medicine

## 2022-09-24 DIAGNOSIS — E8809 Other disorders of plasma-protein metabolism, not elsewhere classified: Secondary | ICD-10-CM

## 2022-09-24 DIAGNOSIS — G319 Degenerative disease of nervous system, unspecified: Secondary | ICD-10-CM | POA: Diagnosis not present

## 2022-09-24 DIAGNOSIS — I129 Hypertensive chronic kidney disease with stage 1 through stage 4 chronic kidney disease, or unspecified chronic kidney disease: Secondary | ICD-10-CM | POA: Diagnosis not present

## 2022-09-24 DIAGNOSIS — Z8546 Personal history of malignant neoplasm of prostate: Secondary | ICD-10-CM | POA: Diagnosis not present

## 2022-09-24 DIAGNOSIS — Z720 Tobacco use: Secondary | ICD-10-CM

## 2022-09-24 DIAGNOSIS — F17210 Nicotine dependence, cigarettes, uncomplicated: Secondary | ICD-10-CM | POA: Diagnosis not present

## 2022-09-24 DIAGNOSIS — I2510 Atherosclerotic heart disease of native coronary artery without angina pectoris: Secondary | ICD-10-CM | POA: Insufficient documentation

## 2022-09-24 DIAGNOSIS — R770 Abnormality of albumin: Secondary | ICD-10-CM | POA: Insufficient documentation

## 2022-09-24 DIAGNOSIS — N183 Chronic kidney disease, stage 3 unspecified: Secondary | ICD-10-CM | POA: Diagnosis not present

## 2022-09-24 DIAGNOSIS — E785 Hyperlipidemia, unspecified: Secondary | ICD-10-CM | POA: Diagnosis present

## 2022-09-24 DIAGNOSIS — I10 Essential (primary) hypertension: Secondary | ICD-10-CM

## 2022-09-24 DIAGNOSIS — R911 Solitary pulmonary nodule: Secondary | ICD-10-CM | POA: Insufficient documentation

## 2022-09-24 DIAGNOSIS — I441 Atrioventricular block, second degree: Secondary | ICD-10-CM | POA: Diagnosis present

## 2022-09-24 DIAGNOSIS — Z79899 Other long term (current) drug therapy: Secondary | ICD-10-CM | POA: Diagnosis not present

## 2022-09-24 DIAGNOSIS — G459 Transient cerebral ischemic attack, unspecified: Principal | ICD-10-CM | POA: Insufficient documentation

## 2022-09-24 DIAGNOSIS — I639 Cerebral infarction, unspecified: Secondary | ICD-10-CM | POA: Diagnosis not present

## 2022-09-24 DIAGNOSIS — R9431 Abnormal electrocardiogram [ECG] [EKG]: Secondary | ICD-10-CM | POA: Diagnosis not present

## 2022-09-24 DIAGNOSIS — I6523 Occlusion and stenosis of bilateral carotid arteries: Secondary | ICD-10-CM | POA: Diagnosis not present

## 2022-09-24 DIAGNOSIS — R29810 Facial weakness: Secondary | ICD-10-CM | POA: Diagnosis not present

## 2022-09-24 DIAGNOSIS — Z7902 Long term (current) use of antithrombotics/antiplatelets: Secondary | ICD-10-CM | POA: Insufficient documentation

## 2022-09-24 DIAGNOSIS — I6521 Occlusion and stenosis of right carotid artery: Secondary | ICD-10-CM | POA: Diagnosis not present

## 2022-09-24 DIAGNOSIS — E119 Type 2 diabetes mellitus without complications: Secondary | ICD-10-CM | POA: Diagnosis not present

## 2022-09-24 DIAGNOSIS — R29818 Other symptoms and signs involving the nervous system: Secondary | ICD-10-CM | POA: Diagnosis not present

## 2022-09-24 DIAGNOSIS — Z794 Long term (current) use of insulin: Secondary | ICD-10-CM | POA: Insufficient documentation

## 2022-09-24 DIAGNOSIS — E46 Unspecified protein-calorie malnutrition: Secondary | ICD-10-CM

## 2022-09-24 DIAGNOSIS — J449 Chronic obstructive pulmonary disease, unspecified: Secondary | ICD-10-CM | POA: Insufficient documentation

## 2022-09-24 DIAGNOSIS — Z95818 Presence of other cardiac implants and grafts: Secondary | ICD-10-CM | POA: Diagnosis not present

## 2022-09-24 DIAGNOSIS — Z85828 Personal history of other malignant neoplasm of skin: Secondary | ICD-10-CM | POA: Diagnosis not present

## 2022-09-24 DIAGNOSIS — I700 Atherosclerosis of aorta: Secondary | ICD-10-CM | POA: Diagnosis not present

## 2022-09-24 DIAGNOSIS — E1122 Type 2 diabetes mellitus with diabetic chronic kidney disease: Secondary | ICD-10-CM | POA: Diagnosis not present

## 2022-09-24 DIAGNOSIS — E782 Mixed hyperlipidemia: Secondary | ICD-10-CM | POA: Insufficient documentation

## 2022-09-24 DIAGNOSIS — Z7984 Long term (current) use of oral hypoglycemic drugs: Secondary | ICD-10-CM | POA: Diagnosis not present

## 2022-09-24 DIAGNOSIS — R531 Weakness: Secondary | ICD-10-CM | POA: Diagnosis not present

## 2022-09-24 DIAGNOSIS — I6503 Occlusion and stenosis of bilateral vertebral arteries: Secondary | ICD-10-CM | POA: Diagnosis not present

## 2022-09-24 DIAGNOSIS — M25551 Pain in right hip: Secondary | ICD-10-CM | POA: Diagnosis not present

## 2022-09-24 DIAGNOSIS — K219 Gastro-esophageal reflux disease without esophagitis: Secondary | ICD-10-CM

## 2022-09-24 DIAGNOSIS — I6529 Occlusion and stenosis of unspecified carotid artery: Secondary | ICD-10-CM | POA: Diagnosis present

## 2022-09-24 DIAGNOSIS — I6782 Cerebral ischemia: Secondary | ICD-10-CM | POA: Diagnosis not present

## 2022-09-24 DIAGNOSIS — K627 Radiation proctitis: Secondary | ICD-10-CM | POA: Diagnosis not present

## 2022-09-24 LAB — DIFFERENTIAL
Abs Immature Granulocytes: 0.02 10*3/uL (ref 0.00–0.07)
Basophils Absolute: 0.1 10*3/uL (ref 0.0–0.1)
Basophils Relative: 1 %
Eosinophils Absolute: 0.1 10*3/uL (ref 0.0–0.5)
Eosinophils Relative: 1 %
Immature Granulocytes: 0 %
Lymphocytes Relative: 43 %
Lymphs Abs: 2.9 10*3/uL (ref 0.7–4.0)
Monocytes Absolute: 0.5 10*3/uL (ref 0.1–1.0)
Monocytes Relative: 7 %
Neutro Abs: 3.3 10*3/uL (ref 1.7–7.7)
Neutrophils Relative %: 48 %

## 2022-09-24 LAB — I-STAT CHEM 8, ED
BUN: 30 mg/dL — ABNORMAL HIGH (ref 8–23)
Calcium, Ion: 1.2 mmol/L (ref 1.15–1.40)
Chloride: 105 mmol/L (ref 98–111)
Creatinine, Ser: 1.8 mg/dL — ABNORMAL HIGH (ref 0.61–1.24)
Glucose, Bld: 99 mg/dL (ref 70–99)
HCT: 36 % — ABNORMAL LOW (ref 39.0–52.0)
Hemoglobin: 12.2 g/dL — ABNORMAL LOW (ref 13.0–17.0)
Potassium: 4.6 mmol/L (ref 3.5–5.1)
Sodium: 141 mmol/L (ref 135–145)
TCO2: 25 mmol/L (ref 22–32)

## 2022-09-24 LAB — COMPREHENSIVE METABOLIC PANEL WITH GFR
ALT: 13 U/L (ref 0–44)
AST: 14 U/L — ABNORMAL LOW (ref 15–41)
Albumin: 3.4 g/dL — ABNORMAL LOW (ref 3.5–5.0)
Alkaline Phosphatase: 59 U/L (ref 38–126)
Anion gap: 6 (ref 5–15)
BUN: 31 mg/dL — ABNORMAL HIGH (ref 8–23)
CO2: 28 mmol/L (ref 22–32)
Calcium: 9 mg/dL (ref 8.9–10.3)
Chloride: 105 mmol/L (ref 98–111)
Creatinine, Ser: 1.73 mg/dL — ABNORMAL HIGH (ref 0.61–1.24)
GFR, Estimated: 38 mL/min — ABNORMAL LOW (ref >60)
Glucose, Bld: 103 mg/dL — ABNORMAL HIGH (ref 70–99)
Potassium: 4.9 mmol/L (ref 3.5–5.1)
Sodium: 139 mmol/L (ref 135–145)
Total Bilirubin: 0.5 mg/dL (ref 0.3–1.2)
Total Protein: 6.2 g/dL — ABNORMAL LOW (ref 6.5–8.1)

## 2022-09-24 LAB — CBC
HCT: 38.1 % — ABNORMAL LOW (ref 39.0–52.0)
Hemoglobin: 12.2 g/dL — ABNORMAL LOW (ref 13.0–17.0)
MCH: 30 pg (ref 26.0–34.0)
MCHC: 32 g/dL (ref 30.0–36.0)
MCV: 93.6 fL (ref 80.0–100.0)
Platelets: 163 10*3/uL (ref 150–400)
RBC: 4.07 MIL/uL — ABNORMAL LOW (ref 4.22–5.81)
RDW: 13.7 % (ref 11.5–15.5)
WBC: 6.8 10*3/uL (ref 4.0–10.5)
nRBC: 0 % (ref 0.0–0.2)

## 2022-09-24 LAB — PROTIME-INR
INR: 1 (ref 0.8–1.2)
Prothrombin Time: 13.7 s (ref 11.4–15.2)

## 2022-09-24 LAB — APTT: aPTT: 38 s — ABNORMAL HIGH (ref 24–36)

## 2022-09-24 LAB — ETHANOL: Alcohol, Ethyl (B): <10 mg/dL (ref <10)

## 2022-09-24 LAB — CBG MONITORING, ED: Glucose-Capillary: 106 mg/dL — ABNORMAL HIGH (ref 70–99)

## 2022-09-24 MED ORDER — ASPIRIN 300 MG RE SUPP: 300.0000 mg | Freq: Every day | RECTAL | Status: DC

## 2022-09-24 MED ORDER — ACETAMINOPHEN 325 MG PO TABS: 650.0000 mg | ORAL_TABLET | Freq: Four times a day (QID) | ORAL | Status: DC | PRN

## 2022-09-24 MED ORDER — IOHEXOL 350 MG/ML SOLN
60.0000 mL | Freq: Once | INTRAVENOUS | Status: AC | PRN
  Administered 2022-09-24: 60 mL via INTRAVENOUS

## 2022-09-24 MED ORDER — ALBUTEROL SULFATE (2.5 MG/3ML) 0.083% IN NEBU: 3.0000 mL | INHALATION_SOLUTION | RESPIRATORY_TRACT | Status: DC | PRN

## 2022-09-24 MED ORDER — INSULIN ASPART 100 UNIT/ML IJ SOLN
0.0000 [IU] | Freq: Three times a day (TID) | INTRAMUSCULAR | Status: DC
  Administered 2022-09-25: 2 [IU] via SUBCUTANEOUS

## 2022-09-24 MED ORDER — ASPIRIN 325 MG PO TABS
325.0000 mg | ORAL_TABLET | Freq: Once | ORAL | Status: AC
  Administered 2022-09-24: 325 mg via ORAL
  Filled 2022-09-24: qty 1

## 2022-09-24 MED ORDER — HEPARIN SODIUM (PORCINE) 5000 UNIT/ML IJ SOLN
5000.0000 [IU] | Freq: Three times a day (TID) | INTRAMUSCULAR | Status: DC
  Administered 2022-09-24 – 2022-09-25 (×3): 5000 [IU] via SUBCUTANEOUS
  Filled 2022-09-24 (×3): qty 1

## 2022-09-24 MED ORDER — CLOPIDOGREL BISULFATE 75 MG PO TABS
75.0000 mg | ORAL_TABLET | Freq: Every day | ORAL | Status: DC
  Administered 2022-09-25: 75 mg via ORAL
  Filled 2022-09-24: qty 1

## 2022-09-24 MED ORDER — INSULIN GLARGINE-YFGN 100 UNIT/ML ~~LOC~~ SOLN
3.0000 [IU] | Freq: Every day | SUBCUTANEOUS | Status: DC
  Filled 2022-09-24: qty 0.03

## 2022-09-24 MED ORDER — STROKE: EARLY STAGES OF RECOVERY BOOK
Freq: Once | Status: AC
  Filled 2022-09-24: qty 1

## 2022-09-24 MED ORDER — GLUCERNA SHAKE PO LIQD
237.0000 mL | Freq: Three times a day (TID) | ORAL | Status: DC
  Administered 2022-09-25: 237 mL via ORAL

## 2022-09-24 MED ORDER — ROSUVASTATIN CALCIUM 10 MG PO TABS: 5.0000 mg | ORAL_TABLET | Freq: Every day | ORAL | Status: DC

## 2022-09-24 MED ORDER — ACETAMINOPHEN 650 MG RE SUPP: 650.0000 mg | Freq: Four times a day (QID) | RECTAL | Status: DC | PRN

## 2022-09-24 MED ORDER — ASPIRIN 300 MG RE SUPP: 300.0000 mg | Freq: Once | RECTAL | Status: DC

## 2022-09-24 MED ORDER — PROCHLORPERAZINE EDISYLATE 10 MG/2ML IJ SOLN: 10.0000 mg | Freq: Four times a day (QID) | INTRAMUSCULAR | Status: DC | PRN

## 2022-09-24 MED ORDER — CLOPIDOGREL BISULFATE 75 MG PO TABS: 75.0000 mg | ORAL_TABLET | Freq: Every day | ORAL | Status: DC

## 2022-09-24 MED ORDER — ENSURE ENLIVE PO LIQD: 237.0000 mL | Freq: Two times a day (BID) | ORAL | Status: DC

## 2022-09-24 MED ORDER — PANTOPRAZOLE SODIUM 40 MG PO TBEC
40.0000 mg | DELAYED_RELEASE_TABLET | Freq: Every day | ORAL | Status: DC
  Administered 2022-09-25: 40 mg via ORAL
  Filled 2022-09-24: qty 1

## 2022-09-24 NOTE — ED Notes (Signed)
CBG 106

## 2022-09-24 NOTE — ED Notes (Addendum)
ED TO INPATIENT HANDOFF REPORT  ED Nurse Name and Phone #:   S Name/Age/Gender Rise Mu 89 y.o. male Room/Bed: APA18/APA18  Code Status   Code Status: Prior  Home/SNF/Other Nursing Home Patient oriented to: person,  time, situation- place- questionable, sometimes says hospital sometimes says home (lives in facility) Is this baseline? Yes   Triage Complete: Triage complete  Chief Complaint TIA (transient ischemic attack) [G45.9]  Triage Note No notes on file   Allergies No Known Allergies  Level of Care/Admitting Diagnosis ED Disposition     ED Disposition  Admit   Condition  --   Comment  Hospital Area: [facility] [[id number]]  Level of Care: Telemetry [5]  Covid Evaluation: Asymptomatic - no recent exposure (last 10 days) testing not required  Diagnosis: TIA (transient ischemic attack) [604540]  Admitting Physician: Frankey Shown [[id number]]  Attending Physician: Frankey Shown [[id number]]          B Medical/Surgery History Past Medical History:  Diagnosis Date   Bronchitis    Hx of   CAD (coronary artery disease)    (2005 LAD 30-40% stenosis  followed by 60-65% focal stenosis.  The second diagonal had 80%   stenosis.  The circumflex had 85-90% mid stenosis and was stented with a  Taxus stent.  The obtuse marginal had 20-25% stenosis and obtuse   marginal-2 had 40-50% stenosis.  The right coronary artery had 40-50%  proximal stenosis and mid 75% stenosis), well-preserved ejection  fraction (50-55% )   Carotid stenosis    History of colonic polyps    HTN (hypertension)    Myocardial infarction [facility])    Obesity    Prostate cancer (HCC) 2001   Radiation    PUD (peptic ulcer disease)    Radiation proctitis    Right bundle branch block    Sigmoid diverticulosis    Skin cancer    Sleep apnea    Tobacco abuse    Type 2 diabetes mellitus (HCC)    Past Surgical History:  Procedure Laterality Date   CARDIAC CATHETERIZATION  12/13/2003    ptca, stent x 2   CHOLECYSTECTOMY     COLONOSCOPY  05/11/2000   COLONOSCOPY W/ POLYPECTOMY     CYSTOSCOPY W/ RETROGRADES Bilateral 05/13/2018   Procedure: CYSTOSCOPY WITH RETROGRADE PYELOGRAM;  Surgeon: Marcine Matar, MD;  Location: [facility];  Service: Urology;  Laterality: Bilateral;   HERNIA REPAIR     right inguinal   Left Arm repair  1994   TRANSURETHRAL RESECTION OF BLADDER TUMOR WITH MITOMYCIN-C N/A 05/13/2018   Procedure: TRANSURETHRAL RESECTION OF BLADDER TUMOR WITH MITOMYCIN-C;  Surgeon: Marcine Matar, MD;  Location: [facility];  Service: Urology;  Laterality: N/A;  30 MNS     A IV Location/Drains/Wounds Patient Lines/Drains/Airways Status     Active Line/Drains/Airways     Name Placement date Placement time Site Days   Peripheral IV 09/24/22 18 G Right Antecubital 09/24/22  1816  Antecubital  less than 1            Intake/Output Last 24 hours No intake or output data in the 24 hours ending 09/24/22 2041  Labs/Imaging Results for orders placed or performed during the hospital encounter of 09/24/22 (from the past 48 hour(s))  CBG monitoring, ED     Status: Abnormal   Collection Time: 09/24/22  5:09 PM  Result Value Ref Range   Glucose-Capillary 106 (H) 70 - 99 mg/dL    Comment: Glucose reference range applies only to samples  taken after fasting for at least 8 hours.  Ethanol     Status: None   Collection Time: 09/24/22  5:11 PM  Result Value Ref Range   Alcohol, Ethyl (B) <10 <10 mg/dL    Comment: (NOTE) Lowest detectable limit for serum alcohol is 10 mg/dL.  For medical purposes only. Performed at [facility], [address] [place], [place] [address]   Protime-INR     Status: None   Collection Time: 09/24/22  5:11 PM  Result Value Ref Range   Prothrombin Time 13.7 11.4 - 15.2 seconds   INR 1.0 0.8 - 1.2    Comment: (NOTE) INR goal varies based on device and disease states. Performed at [facility], [address] [place], [place] [address]   APTT      Status: Abnormal   Collection Time: 09/24/22  5:11 PM  Result Value Ref Range   aPTT 38 (H) 24 - 36 seconds    Comment:        IF BASELINE aPTT IS ELEVATED, SUGGEST PATIENT RISK ASSESSMENT BE USED TO DETERMINE APPROPRIATE ANTICOAGULANT THERAPY. Performed at [facility], [address] [place], [place] [address]   CBC     Status: Abnormal   Collection Time: 09/24/22  5:11 PM  Result Value Ref Range   WBC 6.8 4.0 - 10.5 K/uL   RBC 4.07 (L) 4.22 - 5.81 MIL/uL   Hemoglobin 12.2 (L) 13.0 - 17.0 g/dL   HCT 87.5 (L) 64.3 - 32.9 %   MCV 93.6 80.0 - 100.0 fL   MCH 30.0 26.0 - 34.0 pg   MCHC 32.0 30.0 - 36.0 g/dL   RDW 51.8 84.1 - 66.0 %   Platelets 163 150 - 400 K/uL   nRBC 0.0 0.0 - 0.2 %    Comment: Performed at [facility], [address] [place], [place] [address]  Differential     Status: None   Collection Time: 09/24/22  5:11 PM  Result Value Ref Range   Neutrophils Relative % 48 %   Neutro Abs 3.3 1.7 - 7.7 K/uL   Lymphocytes Relative 43 %   Lymphs Abs 2.9 0.7 - 4.0 K/uL   Monocytes Relative 7 %   Monocytes Absolute 0.5 0.1 - 1.0 K/uL   Eosinophils Relative 1 %   Eosinophils Absolute 0.1 0.0 - 0.5 K/uL   Basophils Relative 1 %   Basophils Absolute 0.1 0.0 - 0.1 K/uL   Immature Granulocytes 0 %   Abs Immature Granulocytes 0.02 0.00 - 0.07 K/uL    Comment: Performed at [facility], [address] [place], [place] [address]  Comprehensive metabolic panel     Status: Abnormal   Collection Time: 09/24/22  5:11 PM  Result Value Ref Range   Sodium 139 135 - 145 mmol/L   Potassium 4.9 3.5 - 5.1 mmol/L   Chloride 105 98 - 111 mmol/L   CO2 28 22 - 32 mmol/L   Glucose, Bld 103 (H) 70 - 99 mg/dL    Comment: Glucose reference range applies only to samples taken after fasting for at least 8 hours.   BUN 31 (H) 8 - 23 mg/dL   Creatinine, Ser 2.35 (H) 0.61 - 1.24 mg/dL   Calcium 9.0 8.9 - 57.3 mg/dL   Total Protein 6.2 (L) 6.5 - 8.1 g/dL   Albumin 3.4 (L) 3.5 - 5.0 g/dL   AST  14 (L) 15 - 41 U/L   ALT 13 0 - 44 U/L   Alkaline Phosphatase  59 38 - 126 U/L   Total Bilirubin 0.5 0.3 - 1.2 mg/dL   GFR, Estimated 38 (L) >60 mL/min    Comment: (NOTE) Calculated using the CKD-EPI Creatinine Equation (2021)    Anion gap 6 5 - 15    Comment: Performed at [facility], [address], [place], [place] [address]  I-stat chem 8, ed     Status: Abnormal   Collection Time: 09/24/22  5:16 PM  Result Value Ref Range   Sodium 141 135 - 145 mmol/L   Potassium 4.6 3.5 - 5.1 mmol/L   Chloride 105 98 - 111 mmol/L   BUN 30 (H) 8 - 23 mg/dL   Creatinine, Ser 6.04 (H) 0.61 - 1.24 mg/dL   Glucose, Bld 99 70 - 99 mg/dL    Comment: Glucose reference range applies only to samples taken after fasting for at least 8 hours.   Calcium, Ion 1.20 1.15 - 1.40 mmol/L   TCO2 25 22 - 32 mmol/L   Hemoglobin 12.2 (L) 13.0 - 17.0 g/dL   HCT 54.0 (L) 98.1 - 19.1 %   DG HIP UNILAT WITH PELVIS 1V RIGHT  Result Date: 09/24/2022 CLINICAL DATA:  [id number] Hip pain, right [id number] EXAM: DG HIP (WITH OR WITHOUT PELVIS) 1V RIGHT COMPARISON:  X-ray right hip 01/27/2022 FINDINGS: There is no evidence of hip fracture or dislocation of the right hip. Frontal view of the left hip grossly unremarkable. No acute displaced fracture or diastasis of the bones of the pelvis. There is no evidence of severe arthropathy or other focal bone abnormality. Severe vascular calcifications. Atherosclerotic plaque of the aorta. IMPRESSION: 1.  Negative for acute traumatic injury. 2. Aortic Atherosclerosis (ICD10-I70.0)-severe. Electronically Signed   By: Tish Frederickson M.D.   On: 09/24/2022 19:01   CT ANGIO HEAD NECK W WO CM (CODE STROKE)  Result Date: 09/24/2022 CLINICAL DATA:  Neuro deficit, acute, stroke suspected EXAM: CT ANGIOGRAPHY HEAD AND NECK WITH AND WITHOUT CONTRAST TECHNIQUE: Multidetector CT imaging of the head and neck was performed using the standard protocol during bolus administration of intravenous contrast.  Multiplanar CT image reconstructions and MIPs were obtained to evaluate the vascular anatomy. Carotid stenosis measurements (when applicable) are obtained utilizing NASCET criteria, using the distal internal carotid diameter as the denominator. RADIATION DOSE REDUCTION: This exam was performed according to the departmental dose-optimization program which includes automated exposure control, adjustment of the mA and/or kV according to patient size and/or use of iterative reconstruction technique. CONTRAST:  60mL OMNIPAQUE IOHEXOL 350 MG/ML SOLN COMPARISON:  CT of the chest April levin 2024.  CT head from today. FINDINGS: CTA NECK FINDINGS Aortic arch: Aortic atherosclerosis. Right carotid system: Bulky atherosclerosis at the carotid bifurcation involving the proximal ICA with severe (greater than 80%) stenosis of the proximal ICA. Left carotid system: Atherosclerosis at the carotid bifurcation with approximately 40 % stenosis. Vertebral arteries: Codominant. No evidence of dissection, stenosis (50% or greater), or occlusion. Skeleton: No acute abnormality on limited assessment. Severe multilevel degenerative change. Other neck: No acute abnormality on limited assessment. Upper chest: Right upper lobe pulmonary nodule (now 1.6 cm versus 1.0 cm on the prior from April 17, 2022). Review of the MIP images confirms the above findings CTA HEAD FINDINGS Anterior circulation: Atherosclerosis of bilateral intracranial ICAs with mild-to-moderate stenosis bilaterally. Bilateral MCAs and ACAs are patent without proximal high-grade stenosis. Posterior circulation: Severe bilateral intradural vertebral artery stenosis. The vertebral arteries remain patent. Basilar artery and bilateral posterior cerebral arteries are patent without proximal  hemodynamically significant stenosis. Venous sinuses: As permitted by contrast timing, patent. Review of the MIP images confirms the above findings IMPRESSION: 1. Interval increase in size of a  1.6 cm right upper lobe pulmonary nodule, which is incompletely evaluated but concerning for malignancy. Recommend dedicated CT of the chest (preferably with contrast) to confirm and fully evaluate. 2. No emergent large vessel occlusion. 3. Severe (greater than 80%) stenosis of the proximal right ICA in the neck. 4. Severe bilateral intradural vertebral artery stenosis. 5. Approximately 40% left proximal ICA stenosis. 6. Mild-to-moderate bilateral intracranial ICA stenosis. 7.  Aortic Atherosclerosis (ICD10-I70.0). Electronically Signed   By: Feliberto Harts M.D.   On: 09/24/2022 18:02   CT HEAD CODE STROKE WO CONTRAST  Result Date: 09/24/2022 CLINICAL DATA:  Code stroke.  Neuro deficit, acute, stroke suspected EXAM: CT HEAD WITHOUT CONTRAST TECHNIQUE: Contiguous axial images were obtained from the base of the skull through the vertex without intravenous contrast. RADIATION DOSE REDUCTION: This exam was performed according to the departmental dose-optimization program which includes automated exposure control, adjustment of the mA and/or kV according to patient size and/or use of iterative reconstruction technique. COMPARISON:  CT head June 30, 24. FINDINGS: Motion limited study.  Within this limitation: Brain: No evidence of acute infarction, hemorrhage, hydrocephalus, extra-axial collection or mass lesion/mass effect. Patchy white matter hypodensities, nonspecific but compatible with chronic microvascular ischemic disease. Vascular: No hyperdense vessel. Skull: No acute fracture. Sinuses/Orbits: Mostly clear sinuses.  No acute orbital findings. Other: No mastoid effusions. ASPECTS [facility] Stroke Program Early CT Score) a total score (0-10 with 10 being normal): 10. IMPRESSION: 1. Motion limited study without evidence of acute abnormality. ASPECTS is 10. 2. Chronic microvascular ischemic disease. Code stroke imaging results were communicated on 09/24/2022 at 5:36 pm to provider NANAVATI via telephone, who  verbally acknowledged these results. Electronically Signed   By: Feliberto Harts M.D.   On: 09/24/2022 17:36    Pending Labs Unresulted Labs (From admission, onward)     Start     Ordered   09/25/22 0500  Lipid panel  (Labs)  Tomorrow morning,   R       Comments: Fasting    09/24/22 1849   09/25/22 0500  Hemoglobin A1c  (Labs)  Tomorrow morning,   R       Comments: To assess prior glycemic control    09/24/22 1849   09/24/22 1721  Urine rapid drug screen (hosp performed)  Once,   STAT        09/24/22 1720            Vitals/Pain Today's Vitals   09/24/22 1809 09/24/22 1820 09/24/22 1835 09/24/22 1950  BP: (!) 151/77   (!) 174/78  Pulse: 66 66  68  Resp: (!) 25 18 20 19   Temp:      TempSrc:      SpO2: 92% 97% 92% 94%    Isolation Precautions No active isolations  Medications Medications   stroke: early stages of recovery book (has no administration in time range)  aspirin tablet 325 mg (has no administration in time range)  iohexol (OMNIPAQUE) 350 MG/ML injection 60 mL (60 mLs Intravenous Contrast Given 09/24/22 1743)    Mobility walks with device     Focused Assessments    R Recommendations: See Admitting Provider Note  Report given to:   Additional Notes:

## 2022-09-24 NOTE — ED Provider Notes (Addendum)
[place] EMERGENCY DEPARTMENT AT [facility] Provider Note   CSN: [id number] Arrival date & time: 09/24/22  1704  An emergency department physician performed an initial assessment on this suspected stroke patient at 1720.  History  No chief complaint on file.   [name] [name] is a 89 y.o. male.  HPI     89 year old male comes in with chief complaint of facial droop.  Code stroke was activated on the field. Patient has past medical history of hypertension, PUD, diabetes.    Per EMS, patient last known normal at 3 PM at the time of shift change.  Thereafter when the nursing staff went to assess him, they noted that patient had left-sided facial droop, slurred speech.  Per EMS patient had right-sided grip weakness.   Patient was assessed immediately.  He denies any complaints from his side besides lower extremity pain.  I spoke with patient's son later on.  He indicates that at baseline patient has right-sided tongue protrusion.  Patient is able to carry on a conversation.  Patient appears a bit agitated to him and he does not recall being brought to the ER (i.e., patient still thinks he is at nursing home), which she found unusual.  He also thinks that ICA stenosis is something he had known about.  Home Medications Prior to Admission medications   Medication Sig Start Date End Date Taking? Authorizing Provider  acetaminophen (TYLENOL) 325 MG tablet Take 650 mg by mouth every 6 (six) hours as needed (for pain).    [provider]  albuterol (VENTOLIN HFA) 108 (90 Base) MCG/ACT inhaler Inhale 2 puffs into the lungs every 4 (four) hours as needed for wheezing or shortness of breath.    [provider]  amLODipine (NORVASC) 10 MG tablet Take 10 mg by mouth daily. 07/21/22   [provider]  amLODipine (NORVASC) 2.5 MG tablet Take 2.5 mg by mouth daily. 07/29/22   [provider]  amLODipine (NORVASC) 5 MG tablet Take 5 mg by mouth daily.   11/12/17   [provider]  cephALEXin (KEFLEX) 500 MG capsule Take 500 mg by mouth 2 (two) times daily. 09/09/22   [provider]  cetirizine (ZYRTEC) 10 MG tablet Take 10 mg by mouth daily. 06/07/22   [provider]  cholestyramine (QUESTRAN) 4 g packet Take 1 packet by mouth 2 (two) times daily. 11/08/21   [provider]  clopidogrel (PLAVIX) 75 MG tablet Take 1 tablet (75 mg total) by mouth daily. 01/31/14   [name] Rotunda, MD  clotrimazole (LOTRIMIN) 1 % cream Apply 1 Application topically 2 (two) times daily. 05/26/22   [provider]  D2000 ULTRA STRENGTH 50 MCG (2000 UT) CAPS Take 1 capsule by mouth daily. 07/28/22   [provider]  diphenoxylate-atropine (LOMOTIL) 2.5-0.025 MG tablet Take 1 tablet by mouth 4 (four) times daily as needed for diarrhea or loose stools. 04/17/22   [name] Shipper, MD  doxycycline (VIBRAMYCIN) 100 MG capsule Take 100 mg by mouth 2 (two) times daily. 09/01/22   [provider]  escitalopram (LEXAPRO) 10 MG tablet Take 10 mg by mouth daily. 12/02/21   [provider]  ferrous sulfate 325 (65 FE) MG EC tablet Take 325 mg by mouth daily with breakfast.     [provider]  irbesartan (AVAPRO) 300 MG tablet Take 300 mg by mouth daily. 07/21/22   [provider]  irbesartan (AVAPRO) 75 MG tablet Take 75 mg by mouth daily. 07/29/22  [provider]  irbesartan-hydrochlorothiazide (AVALIDE) 300-12.5 MG per tablet Take 1 tablet by mouth daily. 01/31/14   [name] Rotunda, MD  LANTUS SOLOSTAR 100 UNIT/ML Solostar Pen Inject 7 Units into the skin at bedtime. 02/06/22   [name] Nine, MD  loperamide (IMODIUM) 2 MG capsule Take 2 mg by mouth 3 (three) times daily as needed for diarrhea or loose stools. 08/11/22   [provider]  loratadine (CLARITIN) 10 MG tablet Take 10 mg by mouth daily.    [provider]  MACROBID 100 MG capsule Take 100 mg by mouth 2 (two) times  daily. 08/15/22   [provider]  Magnesium 500 MG TABS Take 500 mg by mouth in the morning.    [provider]  Magnesium Oxide -Mg Supplement 500 MG TABS Take 1 tablet by mouth daily. 06/26/22   [provider]  melatonin 5 MG TABS Take 5 mg by mouth at bedtime.    [provider]  pantoprazole (PROTONIX) 40 MG tablet Take 1 tablet (40 mg total) by mouth daily. 02/06/22   [name] Nine, MD  predniSONE (DELTASONE) 20 MG tablet Take 3 tablets once daily for 3 days followed by 2 tablets once daily for 3 days followed by 1 tablet once daily for 3 days and then stop 04/17/22   [name] Shipper, MD  rosuvastatin (CRESTOR) 5 MG tablet Take 5 mg by mouth at bedtime. 11/12/17   [provider]  sitaGLIPtin (JANUVIA) 100 MG tablet Take 100 mg by mouth in the morning.    [provider]  terbinafine (LAMISIL) 250 MG tablet Take 250 mg by mouth daily. 04/30/22   [provider]  VIBERZI 75 MG TABS Take by mouth once. 07/29/22   [provider]  Vitamins-Lipotropics (B COMPLEX FORMULA 1, LIPOTROP,) TABS Take by mouth. 04/16/22   [provider]  VOLTAREN ARTHRITIS PAIN 1 % GEL Apply 2 g topically in the morning, at noon, and at bedtime. 09/15/22   [provider]      Allergies    Patient has no known allergies.    Review of Systems   Review of Systems  All other systems reviewed and are negative.   Physical Exam Updated Vital Signs BP (!) 151/77   Pulse 66   Temp 98.1 F (36.7 C) (Oral)   Resp 18   SpO2 97%  Physical Exam Vitals and nursing note reviewed.  Constitutional:      Appearance: He is well-developed.  HENT:     Head: Atraumatic.  Eyes:     Extraocular Movements: Extraocular movements intact.     Pupils: Pupils are equal, round, and reactive to light.     Comments: No visual field defects, no nystagmus  Cardiovascular:     Rate and Rhythm: Normal rate.  Pulmonary:     Effort: Pulmonary effort is  normal.  Musculoskeletal:     Cervical back: Neck supple.  Skin:    General: Skin is warm.  Neurological:     Mental Status: He is alert and oriented to person, place, and time.     Cranial Nerves: No cranial nerve deficit.     Sensory: No sensory deficit.     Motor: No weakness.     Comments: Patient's tongue is protruding right side     ED Results / Procedures / Treatments   Labs (all labs ordered are listed, but only abnormal results are displayed) Labs Reviewed  APTT - Abnormal; Notable for the following  components:      Result Value   aPTT 38 (*)    All other components within normal limits  CBC - Abnormal; Notable for the following components:   RBC 4.07 (*)    Hemoglobin 12.2 (*)    HCT 38.1 (*)    All other components within normal limits  COMPREHENSIVE METABOLIC PANEL - Abnormal; Notable for the following components:   Glucose, Bld 103 (*)    BUN 31 (*)    Creatinine, Ser 1.73 (*)    Total Protein 6.2 (*)    Albumin 3.4 (*)    AST 14 (*)    GFR, Estimated 38 (*)    All other components within normal limits  CBG MONITORING, ED - Abnormal; Notable for the following components:   Glucose-Capillary 106 (*)    All other components within normal limits  I-STAT CHEM 8, ED - Abnormal; Notable for the following components:   BUN 30 (*)    Creatinine, Ser 1.80 (*)    Hemoglobin 12.2 (*)    HCT 36.0 (*)    All other components within normal limits  ETHANOL  PROTIME-INR  DIFFERENTIAL  RAPID URINE DRUG SCREEN, HOSP PERFORMED  LIPID PANEL  HEMOGLOBIN A1C  I-STAT CHEM 8, ED    EKG EKG Interpretation Date/Time:  Wednesday September 24 2022 17:54:48 EDT Ventricular Rate:  56 PR Interval:    QRS Duration:  154 QT Interval:  484 QTC Calculation: 468 R Axis:   17  Text Interpretation: Atrial fibrillation Right bundle branch block No acute changes No significant change since last tracing Confirmed by Derwood Kaplan [phone]) on 09/24/2022 7:20:52 PM  Radiology DG  HIP UNILAT WITH PELVIS 1V RIGHT  Result Date: 09/24/2022 CLINICAL DATA:  [id number] Hip pain, right [id number] EXAM: DG HIP (WITH OR WITHOUT PELVIS) 1V RIGHT COMPARISON:  X-ray right hip 01/27/2022 FINDINGS: There is no evidence of hip fracture or dislocation of the right hip. Frontal view of the left hip grossly unremarkable. No acute displaced fracture or diastasis of the bones of the pelvis. There is no evidence of severe arthropathy or other focal bone abnormality. Severe vascular calcifications. Atherosclerotic plaque of the aorta. IMPRESSION: 1.  Negative for acute traumatic injury. 2. Aortic Atherosclerosis (ICD10-I70.0)-severe. Electronically Signed   By: Tish Frederickson M.D.   On: 09/24/2022 19:01   CT ANGIO HEAD NECK W WO CM (CODE STROKE)  Result Date: 09/24/2022 CLINICAL DATA:  Neuro deficit, acute, stroke suspected EXAM: CT ANGIOGRAPHY HEAD AND NECK WITH AND WITHOUT CONTRAST TECHNIQUE: Multidetector CT imaging of the head and neck was performed using the standard protocol during bolus administration of intravenous contrast. Multiplanar CT image reconstructions and MIPs were obtained to evaluate the vascular anatomy. Carotid stenosis measurements (when applicable) are obtained utilizing NASCET criteria, using the distal internal carotid diameter as the denominator. RADIATION DOSE REDUCTION: This exam was performed according to the departmental dose-optimization program which includes automated exposure control, adjustment of the mA and/or kV according to patient size and/or use of iterative reconstruction technique. CONTRAST:  60mL OMNIPAQUE IOHEXOL 350 MG/ML SOLN COMPARISON:  CT of the chest April levin 2024.  CT head from today. FINDINGS: CTA NECK FINDINGS Aortic arch: Aortic atherosclerosis. Right carotid system: Bulky atherosclerosis at the carotid bifurcation involving the proximal ICA with severe (greater than 80%) stenosis of the proximal ICA. Left carotid system: Atherosclerosis at the carotid  bifurcation with approximately 40 % stenosis. Vertebral arteries: Codominant. No evidence of dissection, stenosis (50% or greater), or occlusion. Skeleton:  No acute abnormality on limited assessment. Severe multilevel degenerative change. Other neck: No acute abnormality on limited assessment. Upper chest: Right upper lobe pulmonary nodule (now 1.6 cm versus 1.0 cm on the prior from April 17, 2022). Review of the MIP images confirms the above findings CTA HEAD FINDINGS Anterior circulation: Atherosclerosis of bilateral intracranial ICAs with mild-to-moderate stenosis bilaterally. Bilateral MCAs and ACAs are patent without proximal high-grade stenosis. Posterior circulation: Severe bilateral intradural vertebral artery stenosis. The vertebral arteries remain patent. Basilar artery and bilateral posterior cerebral arteries are patent without proximal hemodynamically significant stenosis. Venous sinuses: As permitted by contrast timing, patent. Review of the MIP images confirms the above findings IMPRESSION: 1. Interval increase in size of a 1.6 cm right upper lobe pulmonary nodule, which is incompletely evaluated but concerning for malignancy. Recommend dedicated CT of the chest (preferably with contrast) to confirm and fully evaluate. 2. No emergent large vessel occlusion. 3. Severe (greater than 80%) stenosis of the proximal right ICA in the neck. 4. Severe bilateral intradural vertebral artery stenosis. 5. Approximately 40% left proximal ICA stenosis. 6. Mild-to-moderate bilateral intracranial ICA stenosis. 7.  Aortic Atherosclerosis (ICD10-I70.0). Electronically Signed   By: Feliberto Harts M.D.   On: 09/24/2022 18:02   CT HEAD CODE STROKE WO CONTRAST  Result Date: 09/24/2022 CLINICAL DATA:  Code stroke.  Neuro deficit, acute, stroke suspected EXAM: CT HEAD WITHOUT CONTRAST TECHNIQUE: Contiguous axial images were obtained from the base of the skull through the vertex without intravenous contrast. RADIATION  DOSE REDUCTION: This exam was performed according to the departmental dose-optimization program which includes automated exposure control, adjustment of the mA and/or kV according to patient size and/or use of iterative reconstruction technique. COMPARISON:  CT head June 30, 24. FINDINGS: Motion limited study.  Within this limitation: Brain: No evidence of acute infarction, hemorrhage, hydrocephalus, extra-axial collection or mass lesion/mass effect. Patchy white matter hypodensities, nonspecific but compatible with chronic microvascular ischemic disease. Vascular: No hyperdense vessel. Skull: No acute fracture. Sinuses/Orbits: Mostly clear sinuses.  No acute orbital findings. Other: No mastoid effusions. ASPECTS [facility] Stroke Program Early CT Score) a total score (0-10 with 10 being normal): 10. IMPRESSION: 1. Motion limited study without evidence of acute abnormality. ASPECTS is 10. 2. Chronic microvascular ischemic disease. Code stroke imaging results were communicated on 09/24/2022 at 5:36 pm to provider Lasasha Brophy via telephone, who verbally acknowledged these results. Electronically Signed   By: Feliberto Harts M.D.   On: 09/24/2022 17:36    Procedures .Critical Care  Performed by: Derwood Kaplan, MD Authorized by: Derwood Kaplan, MD   Critical care provider statement:    Critical care time (minutes):  32   Critical care was necessary to treat or prevent imminent or life-threatening deterioration of the following conditions:  CNS failure or compromise   Critical care was time spent personally by me on the following activities:  Development of treatment plan with patient or surrogate, discussions with consultants, evaluation of patient's response to treatment, examination of patient, ordering and review of laboratory studies, ordering and review of radiographic studies, ordering and performing treatments and interventions, pulse oximetry, re-evaluation of patient's condition, review of old charts  and obtaining history from patient or surrogate     Medications Ordered in ED Medications   stroke: early stages of recovery book (has no administration in time range)  aspirin suppository 300 mg (has no administration in time range)  iohexol (OMNIPAQUE) 350 MG/ML injection 60 mL (60 mLs Intravenous Contrast Given 09/24/22 1743)  ED Course/ Medical Decision Making/ A&P                                 Medical Decision Making Amount and/or Complexity of Data Reviewed Labs: ordered. Radiology: ordered.  Risk Decision regarding hospitalization.   This patient presents to the ED with chief complaint(s) of code stroke w/ facial droop, slurred speech with pertinent past medical history of CAD, COPD.The complaint involves an extensive differential diagnosis and also carries with it a high risk of complications and morbidity.    The differential diagnosis includes : Acute ischemic stroke, TIA, seizures, hemorrhagic stroke, severe electrolyte abnormality, metabolic derangement, medication side effects, hypoglycemia  The initial plan is to get basic labs, continue with code stroke.  Additional history obtained: Additional history obtained from family and EMS  Records reviewed previous admission documents, including ultrasound carotid which had revealed right-sided carotid stenosis.  Independent labs interpretation:  The following labs were independently interpreted: Patient has slightly elevated creatinine of 1.73 and BUN of 31. No leukocytosis.  Electrolytes otherwise reassuring.  Independent visualization and interpretation of imaging: - I independently visualized the following imaging with scope of interpretation limited to determining acute life threatening conditions related to emergency care: CT scan of the brain, which revealed no evidence of brain bleed  Treatment and Reassessment: Patient reassessed with the son.  Neuroexam remains nonfocal at this time.  Consultation: -  Consulted or discussed management/test interpretation with external professional: Neurology team, they recommend that patient be admitted for TIA workup.  Not a candidate for TNK.   Final Clinical Impression(s) / ED Diagnoses Final diagnoses:  TIA (transient ischemic attack)  Stenosis of right carotid artery    Rx / DC Orders ED Discharge Orders     None         Derwood Kaplan, MD 09/24/22 1932    Derwood Kaplan, MD 09/24/22 1933

## 2022-09-24 NOTE — H&P (Signed)
History and Physical    Patient: [name] [name]:[id number] DOB: [birth year] DOA: 09/24/2022 DOS: the patient was seen and examined on 09/24/2022 PCP: Chilton Greathouse, MD  Patient coming from: SNF  Chief Complaint: No chief complaint on file.  HPI: [name] [name] is a 89 y.o. male with medical history significant of hypertension, hyperlipidemia, CKD 3, OSA, COPD, GERD, second-degree AV block who presents to the emergency department from SNF via EMS due to suspicion for stroke.  Patient was unable to provide a history, history was obtained from ED physician and ED medical record.  Per report, last known normal was at 3 PM at the time of shift change, on being assessed by the nursing staff, he was noted with left sided facial droop, slurred speech and right sided grip weakness.  At baseline, patient has right-sided tongue protrusion, he ambulates with a walker and was able to carry on a conversation.  Patient was alert at bedside, but he thought that he was still at the nursing home.  He denies chest pain, shortness of breath, fever, chills, nausea, vomiting.  ED Course:  In the emergency department, BP was 183/82, pulse 58 bpm, other vital signs were within normal range.  Workup in the ED showed normocytic anemia, BMP was normal except for blood glucose of 103, BUN 31, creatinine 1.73 (baseline creatinine at 1.3-1.5), albumin 3.4, EGFR 38 Right hip x-ray was negative for acute traumatic injury CT angio head neck with and without contrast showed interval  increase in size of a 1.6 cm right upper lobe pulmonary nodule, which is incompletely evaluated but concerning for malignancy.  No emergent LVO.  Severe (greater than 80%) stenosis of the proximal right ICA in the neck.  Severe bilateral intradural vertebral artery stenosis.  Approximately 40% left proximal ICA stenosis.  Mild to moderate bilateral intracranial ICA stenosis. CT head without contrast showed no acute abnormality Aspirin 325 mg x 1 was  given. Patient's symptoms have since resolved in the ED Hospitalist was asked to admit patient for further evaluation and management.  Review of Systems: Review of systems as noted in the HPI. All other systems reviewed and are negative.   Past Medical History:  Diagnosis Date   Bronchitis    Hx of   CAD (coronary artery disease)    (2005 LAD 30-40% stenosis  followed by 60-65% focal stenosis.  The second diagonal had 80%   stenosis.  The circumflex had 85-90% mid stenosis and was stented with a  Taxus stent.  The obtuse marginal had 20-25% stenosis and obtuse   marginal-2 had 40-50% stenosis.  The right coronary artery had 40-50%  proximal stenosis and mid 75% stenosis), well-preserved ejection  fraction (50-55% )   Carotid stenosis    History of colonic polyps    HTN (hypertension)    Myocardial infarction [facility])    Obesity    Prostate cancer (HCC) 2001   Radiation    PUD (peptic ulcer disease)    Radiation proctitis    Right bundle branch block    Sigmoid diverticulosis    Skin cancer    Sleep apnea    Tobacco abuse    Type 2 diabetes mellitus (HCC)    Past Surgical History:  Procedure Laterality Date   CARDIAC CATHETERIZATION  12/13/2003   ptca, stent x 2   CHOLECYSTECTOMY     COLONOSCOPY  05/11/2000   COLONOSCOPY W/ POLYPECTOMY     CYSTOSCOPY W/ RETROGRADES Bilateral 05/13/2018   Procedure: CYSTOSCOPY WITH RETROGRADE  PYELOGRAM;  Surgeon: Marcine Matar, MD;  Location: [facility];  Service: Urology;  Laterality: Bilateral;   HERNIA REPAIR     right inguinal   Left Arm repair  1994   TRANSURETHRAL RESECTION OF BLADDER TUMOR WITH MITOMYCIN-C N/A 05/13/2018   Procedure: TRANSURETHRAL RESECTION OF BLADDER TUMOR WITH MITOMYCIN-C;  Surgeon: Marcine Matar, MD;  Location: [facility];  Service: Urology;  Laterality: N/A;  30 MNS    Social History:  reports that he has been smoking cigarettes. He has a 60 pack-year smoking history. He has never used smokeless tobacco. He reports that  he does not drink alcohol and does not use drugs.   No Known Allergies  Family History  Problem Relation Age of Onset   Cancer Father    Coronary artery disease Father 36   Coronary artery disease Brother 44   Coronary artery disease Sister 24   Stroke Mother 60     Prior to Admission medications   Medication Sig Start Date End Date Taking? Authorizing Provider  acetaminophen (TYLENOL) 325 MG tablet Take 650 mg by mouth every 6 (six) hours as needed (for pain).    [provider]  albuterol (VENTOLIN HFA) 108 (90 Base) MCG/ACT inhaler Inhale 2 puffs into the lungs every 4 (four) hours as needed for wheezing or shortness of breath.    [provider]  amLODipine (NORVASC) 10 MG tablet Take 10 mg by mouth daily. 07/21/22   [provider]  amLODipine (NORVASC) 2.5 MG tablet Take 2.5 mg by mouth daily. 07/29/22   [provider]  amLODipine (NORVASC) 5 MG tablet Take 5 mg by mouth daily.  11/12/17   [provider]  cephALEXin (KEFLEX) 500 MG capsule Take 500 mg by mouth 2 (two) times daily. 09/09/22   [provider]  cetirizine (ZYRTEC) 10 MG tablet Take 10 mg by mouth daily. 06/07/22   [provider]  cholestyramine (QUESTRAN) 4 g packet Take 1 packet by mouth 2 (two) times daily. 11/08/21   [provider]  clopidogrel (PLAVIX) 75 MG tablet Take 1 tablet (75 mg total) by mouth daily. 01/31/14   [name], MD  clotrimazole (LOTRIMIN) 1 % cream Apply 1 Application topically 2 (two) times daily. 05/26/22   [provider]  D2000 ULTRA STRENGTH 50 MCG (2000 UT) CAPS Take 1 capsule by mouth daily. 07/28/22   [provider]  diphenoxylate-atropine (LOMOTIL) 2.5-0.025 MG tablet Take 1 tablet by mouth 4 (four) times daily as needed for diarrhea or loose stools. 04/17/22   Osvaldo Shipper, MD  doxycycline (VIBRAMYCIN) 100 MG capsule Take 100 mg by mouth 2 (two) times daily. 09/01/22   [provider]   escitalopram (LEXAPRO) 10 MG tablet Take 10 mg by mouth daily. 12/02/21   [provider]  ferrous sulfate 325 (65 FE) MG EC tablet Take 325 mg by mouth daily with breakfast.     [provider]  irbesartan (AVAPRO) 300 MG tablet Take 300 mg by mouth daily. 07/21/22   [provider]  irbesartan (AVAPRO) 75 MG tablet Take 75 mg by mouth daily. 07/29/22   [provider]  irbesartan-hydrochlorothiazide (AVALIDE) 300-12.5 MG per tablet Take 1 tablet by mouth daily. 01/31/14   [name], MD  LANTUS SOLOSTAR 100 UNIT/ML Solostar Pen Inject 7 Units into the skin at bedtime. 02/06/22   Tyrone Nine, MD  loperamide (IMODIUM) 2 MG capsule Take 2 mg by mouth 3 (three) times daily as needed for diarrhea or  loose stools. 08/11/22   [provider]  loratadine (CLARITIN) 10 MG tablet Take 10 mg by mouth daily.    [provider]  MACROBID 100 MG capsule Take 100 mg by mouth 2 (two) times daily. 08/15/22   [provider]  Magnesium 500 MG TABS Take 500 mg by mouth in the morning.    [provider]  Magnesium Oxide -Mg Supplement 500 MG TABS Take 1 tablet by mouth daily. 06/26/22   [provider]  melatonin 5 MG TABS Take 5 mg by mouth at bedtime.    [provider]  pantoprazole (PROTONIX) 40 MG tablet Take 1 tablet (40 mg total) by mouth daily. 02/06/22   Tyrone Nine, MD  predniSONE (DELTASONE) 20 MG tablet Take 3 tablets once daily for 3 days followed by 2 tablets once daily for 3 days followed by 1 tablet once daily for 3 days and then stop 04/17/22   Osvaldo Shipper, MD  rosuvastatin (CRESTOR) 5 MG tablet Take 5 mg by mouth at bedtime. 11/12/17   [provider]  sitaGLIPtin (JANUVIA) 100 MG tablet Take 100 mg by mouth in the morning.    [provider]  terbinafine (LAMISIL) 250 MG tablet Take 250 mg by mouth daily. 04/30/22   [provider]  VIBERZI 75 MG TABS Take by mouth once. 07/29/22    [provider]  Vitamins-Lipotropics (B COMPLEX FORMULA 1, LIPOTROP,) TABS Take by mouth. 04/16/22   [provider]  VOLTAREN ARTHRITIS PAIN 1 % GEL Apply 2 g topically in the morning, at noon, and at bedtime. 09/15/22   [provider]    Physical Exam: BP (!) 163/95 (BP Location: Right Arm)   Pulse 68   Temp 98.2 F (36.8 C) (Oral)   Resp 18   Ht 5\' 3"  (1.6 m)   Wt 73.4 kg   SpO2 95%   BMI 28.66 kg/m   General: 89 y.o. year-old male well developed well nourished in no acute distress.   HEENT: NCAT, EOMI Neck: Supple, trachea medial Cardiovascular: Regular rate and rhythm with no rubs or gallops.  No thyromegaly or JVD noted.  No lower extremity edema. 2/4 pulses in all 4 extremities. Respiratory: Clear to auscultation with no wheezes or rales. Good inspiratory effort. Abdomen: Soft, nontender nondistended with normal bowel sounds x4 quadrants. Muskuloskeletal: No cyanosis, clubbing or edema noted bilaterally Neuro: Alert and oriented to person and time, but not to place.  CN II-XII intact, strength 5/5 x 4, sensation, reflexes intact Skin: No ulcerative lesions noted or rashes Psychiatry: Mood is appropriate for condition and setting          Labs on Admission:  Basic Metabolic Panel: Recent Labs  Lab 09/24/22 1711 09/24/22 1716  NA 139 141  K 4.9 4.6  CL 105 105  CO2 28  --   GLUCOSE 103* 99  BUN 31* 30*  CREATININE 1.73* 1.80*  CALCIUM 9.0  --    Liver Function Tests: Recent Labs  Lab 09/24/22 1711  AST 14*  ALT 13  ALKPHOS 59  BILITOT 0.5  PROT 6.2*  ALBUMIN 3.4*   No results for input(s): "LIPASE", "AMYLASE" in the last 168 hours. No results for input(s): "AMMONIA" in the last 168 hours. CBC: Recent Labs  Lab 09/24/22 1711 09/24/22 1716  WBC 6.8  --   NEUTROABS 3.3  --   HGB 12.2* 12.2*  HCT 38.1* 36.0*  MCV 93.6  --   PLT 163  --  Cardiac Enzymes: No results for input(s): "CKTOTAL", "CKMB", "CKMBINDEX",  "TROPONINI" in the last 168 hours.  BNP (last 3 results) No results for input(s): "BNP" in the last 8760 hours.  ProBNP (last 3 results) No results for input(s): "PROBNP" in the last 8760 hours.  CBG: Recent Labs  Lab 09/24/22 1709  GLUCAP 106*    Radiological Exams on Admission: MR BRAIN WO CONTRAST  Result Date: 09/24/2022 CLINICAL DATA:  Stroke, follow up EXAM: MRI HEAD WITHOUT CONTRAST TECHNIQUE: Multiplanar, multiecho pulse sequences of the brain and surrounding structures were obtained without intravenous contrast. COMPARISON:  CT head 09/24/2022. FINDINGS: Motion limited study.  Within this limitation: Brain: No acute infarction, hemorrhage, hydrocephalus, extra-axial collection or mass lesion. Moderate T2/FLAIR hyperintensity in the white matter, compatible with chronic microvascular ischemic disease. Cerebral atrophy. Vascular: Major arterial flow voids are maintained at the skull base. Skull and upper cervical spine: Normal marrow signal. Sinuses/Orbits: Clear sinuses.  No acute orbital findings. Other: No mastoid effusions. IMPRESSION: Motion limited study without evidence of acute abnormality. Electronically Signed   By: Feliberto Harts M.D.   On: 09/24/2022 21:14   DG HIP UNILAT WITH PELVIS 1V RIGHT  Result Date: 09/24/2022 CLINICAL DATA:  [id number] Hip pain, right [id number] EXAM: DG HIP (WITH OR WITHOUT PELVIS) 1V RIGHT COMPARISON:  X-ray right hip 01/27/2022 FINDINGS: There is no evidence of hip fracture or dislocation of the right hip. Frontal view of the left hip grossly unremarkable. No acute displaced fracture or diastasis of the bones of the pelvis. There is no evidence of severe arthropathy or other focal bone abnormality. Severe vascular calcifications. Atherosclerotic plaque of the aorta. IMPRESSION: 1.  Negative for acute traumatic injury. 2. Aortic Atherosclerosis (ICD10-I70.0)-severe. Electronically Signed   By: Tish Frederickson M.D.   On: 09/24/2022 19:01   CT ANGIO HEAD  NECK W WO CM (CODE STROKE)  Result Date: 09/24/2022 CLINICAL DATA:  Neuro deficit, acute, stroke suspected EXAM: CT ANGIOGRAPHY HEAD AND NECK WITH AND WITHOUT CONTRAST TECHNIQUE: Multidetector CT imaging of the head and neck was performed using the standard protocol during bolus administration of intravenous contrast. Multiplanar CT image reconstructions and MIPs were obtained to evaluate the vascular anatomy. Carotid stenosis measurements (when applicable) are obtained utilizing NASCET criteria, using the distal internal carotid diameter as the denominator. RADIATION DOSE REDUCTION: This exam was performed according to the departmental dose-optimization program which includes automated exposure control, adjustment of the mA and/or kV according to patient size and/or use of iterative reconstruction technique. CONTRAST:  60mL OMNIPAQUE IOHEXOL 350 MG/ML SOLN COMPARISON:  CT of the chest April levin 2024.  CT head from today. FINDINGS: CTA NECK FINDINGS Aortic arch: Aortic atherosclerosis. Right carotid system: Bulky atherosclerosis at the carotid bifurcation involving the proximal ICA with severe (greater than 80%) stenosis of the proximal ICA. Left carotid system: Atherosclerosis at the carotid bifurcation with approximately 40 % stenosis. Vertebral arteries: Codominant. No evidence of dissection, stenosis (50% or greater), or occlusion. Skeleton: No acute abnormality on limited assessment. Severe multilevel degenerative change. Other neck: No acute abnormality on limited assessment. Upper chest: Right upper lobe pulmonary nodule (now 1.6 cm versus 1.0 cm on the prior from April 17, 2022). Review of the MIP images confirms the above findings CTA HEAD FINDINGS Anterior circulation: Atherosclerosis of bilateral intracranial ICAs with mild-to-moderate stenosis bilaterally. Bilateral MCAs and ACAs are patent without proximal high-grade stenosis. Posterior circulation: Severe bilateral intradural vertebral artery  stenosis. The vertebral arteries remain patent. Basilar artery and bilateral posterior cerebral  arteries are patent without proximal hemodynamically significant stenosis. Venous sinuses: As permitted by contrast timing, patent. Review of the MIP images confirms the above findings IMPRESSION: 1. Interval increase in size of a 1.6 cm right upper lobe pulmonary nodule, which is incompletely evaluated but concerning for malignancy. Recommend dedicated CT of the chest (preferably with contrast) to confirm and fully evaluate. 2. No emergent large vessel occlusion. 3. Severe (greater than 80%) stenosis of the proximal right ICA in the neck. 4. Severe bilateral intradural vertebral artery stenosis. 5. Approximately 40% left proximal ICA stenosis. 6. Mild-to-moderate bilateral intracranial ICA stenosis. 7.  Aortic Atherosclerosis (ICD10-I70.0). Electronically Signed   By: Feliberto Harts M.D.   On: 09/24/2022 18:02   CT HEAD CODE STROKE WO CONTRAST  Result Date: 09/24/2022 CLINICAL DATA:  Code stroke.  Neuro deficit, acute, stroke suspected EXAM: CT HEAD WITHOUT CONTRAST TECHNIQUE: Contiguous axial images were obtained from the base of the skull through the vertex without intravenous contrast. RADIATION DOSE REDUCTION: This exam was performed according to the departmental dose-optimization program which includes automated exposure control, adjustment of the mA and/or kV according to patient size and/or use of iterative reconstruction technique. COMPARISON:  CT head June 30, 24. FINDINGS: Motion limited study.  Within this limitation: Brain: No evidence of acute infarction, hemorrhage, hydrocephalus, extra-axial collection or mass lesion/mass effect. Patchy white matter hypodensities, nonspecific but compatible with chronic microvascular ischemic disease. Vascular: No hyperdense vessel. Skull: No acute fracture. Sinuses/Orbits: Mostly clear sinuses.  No acute orbital findings. Other: No mastoid effusions. ASPECTS  [facility] Stroke Program Early CT Score) a total score (0-10 with 10 being normal): 10. IMPRESSION: 1. Motion limited study without evidence of acute abnormality. ASPECTS is 10. 2. Chronic microvascular ischemic disease. Code stroke imaging results were communicated on 09/24/2022 at 5:36 pm to provider NANAVATI via telephone, who verbally acknowledged these results. Electronically Signed   By: Feliberto Harts M.D.   On: 09/24/2022 17:36    EKG: I independently viewed the EKG done and my findings are as followed: Normal sinus rhythm at a rate of 69 bpm with RBBB and prolonged QTc of 509 ms  Assessment/Plan Present on Admission:  TIA (transient ischemic attack)  Carotid stenosis  COPD (chronic obstructive pulmonary disease) (HCC)  Coronary artery disease involving native coronary artery of native heart without angina pectoris  Essential hypertension  GERD (gastroesophageal reflux disease)  Mixed hyperlipidemia  Tobacco abuse  Heart block AV second degree  Principal Problem:   TIA (transient ischemic attack) Active Problems:   Diabetes mellitus type 2 in nonobese (HCC)   Mixed hyperlipidemia   Essential hypertension   COPD (chronic obstructive pulmonary disease) (HCC)   Tobacco abuse   Coronary artery disease involving native coronary artery of native heart without angina pectoris   Carotid stenosis   GERD (gastroesophageal reflux disease)   Heart block AV second degree   Pulmonary nodule   Prolonged QT interval  Transient ischemic attack rule out acute CVA Patient will be admitted to telemetry unit  CT head without contrast showed no acute abnormality CT angiography head and neck showed no LVO Echocardiogram in the morning MRI of brain without contrast showed motion limited study without evidence of acute abnormality  Continue Plavix and statin Continue fall precautions and neuro checks Lipid panel and hemoglobin A1c will be checked Continue PT/SLP/OT eval and treat Bedside  swallow eval by nursing prior to diet Telemetry neurology will be consulted and we will await further recommendation  Carotid stenosis CT angiography  of head and neck showed severe (greater than 80%) stenosis of the proximal right ICA in the neck. This is chronic.  Patient did not want any intervention per daughter at bedside  Pulmonary nodules CT angio head neck with and without contrast showed interval  increase in size of a 1.6 cm right upper lobe pulmonary nodule. Dedicated CT of the chest (preferably with contrast) to confirm and fully evaluate was recommended by radiologist.  Hypoalbuminemia possibly secondary to mild protein calorie malnutrition Albumin 3.4, protein supplement will be provided  Prolonged QT interval QTc 509 ms  Avoid QT prolonging drugs Magnesium level will be checked Repeat EKG in the morning  COPD (not in acute exacerbation) Continue albuterol  Diabetes mellitus type 2 in nonobese Continue SSI and hypoglycemic protocol  Continue Semglee at 3 units and adjust dose accordingly  Check  HgA1C  Hold oral medications    Mixed hyperlipidemia Continue Crestor   Tobacco abuse Currently in remission   Essential hypertension  Antihypertensives PRN if Blood pressure is greater than 220/120 or there is a concern for End organ damage/contraindications for permissive HTN. If blood pressure is greater than 220/120 give labetalol PO or IV or Vasotec IV with a goal of 15% reduction in BP during the first 24 hours.    Coronary artery disease involving native coronary artery of native heart without angina pectoris Chronic stable,  continue Plavix and Crestor   Heart block AV second degree Chronic stable patient off of beta-blockers   DVT prophylaxis: Heparin subcu  Advance Care Planning: CODE STATUS: DNR (confirmed by daughter at bedside)  Consults: Teleneurology  Family Communication: Daughter at bedside (all questions answered to satisfaction)  Severity  of Illness: The appropriate patient status for this patient is OBSERVATION. Observation status is judged to be reasonable and necessary in order to provide the required intensity of service to ensure the patient's safety. The patient's presenting symptoms, physical exam findings, and initial radiographic and laboratory data in the context of their medical condition is felt to place them at decreased risk for further clinical deterioration. Furthermore, it is anticipated that the patient will be medically stable for discharge from the hospital within 2 midnights of admission.   Author: Frankey Shown, DO 09/24/2022 10:16 PM  For on call review [email or web address].

## 2022-09-24 NOTE — ED Notes (Signed)
Pt c/o right thigh and hip pain during NIH. Neuorologist requested to speak to family , cart taken into room and family had walked outside.

## 2022-09-24 NOTE — Consult Note (Addendum)
[facility] Telemedicine Consult   Requesting Provider: Dr. Rhunette Croft  Chief Complaint: code stroke  HPI:  89 yo M with hx of DM, HLD, CAD/MI, second degree AV block, A flutter not on AC, chronic left arm fracture, prostate cancer presented to ED for a code stroke. It is not very clear about his stroke symptoms. Per EDP and EMS, pt last seen well at 3pm. Around 4:50pm EMS got call about slurry speech and right facial droop with right arm weakness. I called pt daughter Mickle Mallory, she said at baseline pt can communicate well, walk with walker in short distance with assistance, chronic left arm fracture, call lift up but not able to use left hand. Tongue constantly hanging out of mouth to the right. I also called pt son Drexall, he said he was told that pt was incoherent, left facial droop and not communicate well. I also called his nursing facility at [facility] [place], I was told that he was found in chair not speaking, with left facial droop and tongue was inside mouth not hanging out as he used to be. The facility denies any arm or leg weakness.   In ER, with exam from EDP Dr. Rhunette Croft and myself, pt follows commands, able to answer questions with short sentences, able to name and repeat, able to read. No significant facial asymmetry, tongue hanging out of mouth with right protrusion. No arm or leg drift. No clear focal deficit at this time. CT no acute abnormality. CTA head and neck no LVO. Glucose 123.  Of note, he did have paroxysmal aflutter, followed by cardiology, not on [facility] due to frequent falls. Has AVB but no indication for pacemaker per cardiology. Has chronic fracture deformity of distal humerus and elbow. Pt daughter he has "no elbow", able to lift up left arm but not able to use left forarm or left hand.   LKW: 3pm tpa given?: No, rapid improving, no focal deficit IR Thrombectomy? No, no LVO Modified Rankin Scale: 4-Needs assistance to walk and tend to bodily  needs   Exam: Vitals:   09/24/22 1755 09/24/22 1758  BP: (!) 183/82   Pulse:  (!) 58  Resp:  16  Temp:    SpO2:  94%     Temp:  [98.1 F (36.7 C)] 98.1 F (36.7 C) (09/18 1754) Pulse Rate:  [58] 58 (09/18 1758) Resp:  [16] 16 (09/18 1758) BP: (183)/(82) 183/82 (09/18 1755) SpO2:  [94 %] 94 % (09/18 1758)  General - Well nourished, well developed, with intermittent pain (source unclear, left shoulder vs. Right hip), pt can not tell where he felt hurt.  Ophthalmologic - fundi not visualized due to noncooperation.  Cardiovascular - Regular rhythm and rate.  Neuro - awake, alert, eyes open, orientated to self and DOB, but not answer the place, time or age. No aphasia, able to answer questions in short sentence, paucity of speech, following all simple commands. Able to name 3/5 and repeat and read. Mild dysarthria. No gaze palsy, tracking bilaterally, visual field full. No obvious facial droop. Tongue protruding to right which is chronic per daughter and nursing facility. Bilateral UEs proximal 5/5, no drift. Bilaterally LEs 5/5, no drift. Sensation symmetrical bilaterally subjectively, right FTN intact grossly, left FTN ataxic and hand to finish due to chronic distal humeral and elbow fracture, b/l HTS ataxic and hard to finish but due to pain at hips, gait not tested.     NIH Stroke Scale  Level Of Consciousness 0=Alert; keenly responsive  1=Arouse to minor stimulation 2=Requires repeated stimulation to arouse or movements to pain 3=postures or unresponsive 0  LOC Questions to Month and Age 10=Answers both questions correctly 1=Answers one question correctly or dysarthria/intubated/trauma/language barrier 2=Answers neither question correctly or aphasia 2  LOC Commands      -Open/Close eyes     -Open/close grip     -Pantomime commands if communication barrier 0=Performs both tasks correctly 1=Performs one task correctly 2=Performs neighter task correctly 0  Best Gaze      -Only assess horizontal gaze 0=Normal 1=Partial gaze palsy 2=Forced deviation, or total gaze paresis 0  Visual 0=No visual loss 1=Partial hemianopia 2=Complete hemianopia 3=Bilateral hemianopia (blind including cortical blindness) 0  Facial Palsy     -Use grimace if obtunded 0=Normal symmetrical movement 1=Minor paralysis (asymmetry) 2=Partial paralysis (lower face) 3=Complete paralysis (upper and lower face) 0  Motor  0=No drift for 10/5 seconds 1=Drift, but does not hit bed 2=Some antigravity effort, hits  bed 3=No effort against gravity, limb falls 4=No movement 0=Amputation/joint fusion Right Arm 0     Leg 0    Left Arm 0     Leg 0  Limb Ataxia     - FNT/HTS 0=Absent or does not understand or paralyzed or amputation/joint fusion 1=Present in one limb 2=Present in two limbs 2  Sensory 0=Normal 1=Mild to moderate sensory loss 2=Severe to total sensory loss or coma/unresponsive 0  Best Language 0=No aphasia, normal 1=Mild to moderate aphasia 2=Severe aphasia 3=Mute, global aphasia, or coma/unresponsive 0  Dysarthria 0=Normal 1=Mild to moderate 2=Severe, unintelligible or mute/anarthric 0=intubated/unable to test 1  Extinction/Neglect 0=No abnormality 1=visual/tactile/auditory/spatia/personal inattention/Extinction to bilateral simultaneous stimulation 2=Profound neglect/extinction more than 1 modality  1  Total   6     Imaging Reviewed:  CT ANGIO HEAD NECK W WO CM (CODE STROKE)  Result Date: 09/24/2022 CLINICAL DATA:  Neuro deficit, acute, stroke suspected EXAM: CT ANGIOGRAPHY HEAD AND NECK WITH AND WITHOUT CONTRAST TECHNIQUE: Multidetector CT imaging of the head and neck was performed using the standard protocol during bolus administration of intravenous contrast. Multiplanar CT image reconstructions and MIPs were obtained to evaluate the vascular anatomy. Carotid stenosis measurements (when applicable) are obtained utilizing NASCET criteria, using the distal  internal carotid diameter as the denominator. RADIATION DOSE REDUCTION: This exam was performed according to the departmental dose-optimization program which includes automated exposure control, adjustment of the mA and/or kV according to patient size and/or use of iterative reconstruction technique. CONTRAST:  60mL OMNIPAQUE IOHEXOL 350 MG/ML SOLN COMPARISON:  CT of the chest April levin 2024.  CT head from today. FINDINGS: CTA NECK FINDINGS Aortic arch: Aortic atherosclerosis. Right carotid system: Bulky atherosclerosis at the carotid bifurcation involving the proximal ICA with severe (greater than 80%) stenosis of the proximal ICA. Left carotid system: Atherosclerosis at the carotid bifurcation with approximately 40 % stenosis. Vertebral arteries: Codominant. No evidence of dissection, stenosis (50% or greater), or occlusion. Skeleton: No acute abnormality on limited assessment. Severe multilevel degenerative change. Other neck: No acute abnormality on limited assessment. Upper chest: Right upper lobe pulmonary nodule (now 1.6 cm versus 1.0 cm on the prior from April 17, 2022). Review of the MIP images confirms the above findings CTA HEAD FINDINGS Anterior circulation: Atherosclerosis of bilateral intracranial ICAs with mild-to-moderate stenosis bilaterally. Bilateral MCAs and ACAs are patent without proximal high-grade stenosis. Posterior circulation: Severe bilateral intradural vertebral artery stenosis. The vertebral arteries remain patent. Basilar artery and bilateral posterior cerebral arteries are patent without proximal hemodynamically significant  stenosis. Venous sinuses: As permitted by contrast timing, patent. Review of the MIP images confirms the above findings IMPRESSION: 1. Interval increase in size of a 1.6 cm right upper lobe pulmonary nodule, which is incompletely evaluated but concerning for malignancy. Recommend dedicated CT of the chest (preferably with contrast) to confirm and fully evaluate.  2. No emergent large vessel occlusion. 3. Severe (greater than 80%) stenosis of the proximal right ICA in the neck. 4. Severe bilateral intradural vertebral artery stenosis. 5. Approximately 40% left proximal ICA stenosis. 6. Mild-to-moderate bilateral intracranial ICA stenosis. 7.  Aortic Atherosclerosis (ICD10-I70.0). Electronically Signed   By: Feliberto Harts M.D.   On: 09/24/2022 18:02   CT HEAD CODE STROKE WO CONTRAST  Result Date: 09/24/2022 CLINICAL DATA:  Code stroke.  Neuro deficit, acute, stroke suspected EXAM: CT HEAD WITHOUT CONTRAST TECHNIQUE: Contiguous axial images were obtained from the base of the skull through the vertex without intravenous contrast. RADIATION DOSE REDUCTION: This exam was performed according to the departmental dose-optimization program which includes automated exposure control, adjustment of the mA and/or kV according to patient size and/or use of iterative reconstruction technique. COMPARISON:  CT head June 30, 24. FINDINGS: Motion limited study.  Within this limitation: Brain: No evidence of acute infarction, hemorrhage, hydrocephalus, extra-axial collection or mass lesion/mass effect. Patchy white matter hypodensities, nonspecific but compatible with chronic microvascular ischemic disease. Vascular: No hyperdense vessel. Skull: No acute fracture. Sinuses/Orbits: Mostly clear sinuses.  No acute orbital findings. Other: No mastoid effusions. ASPECTS [facility] Stroke Program Early CT Score) a total score (0-10 with 10 being normal): 10. IMPRESSION: 1. Motion limited study without evidence of acute abnormality. ASPECTS is 10. 2. Chronic microvascular ischemic disease. Code stroke imaging results were communicated on 09/24/2022 at 5:36 pm to provider NANAVATI via telephone, who verbally acknowledged these results. Electronically Signed   By: Feliberto Harts M.D.   On: 09/24/2022 17:36     Labs reviewed in epic and pertinent values follow: Cre 1.80, platelet  163  Assessment:  89 yo M with hx of DM, HLD, CAD/MI, second degree AV block, A flutter not on AC, chronic left arm fracture, prostate cancer presented to ED for a code stroke. It is not very clear about his stroke symptoms even I have talked with nursing facility, daughter, son and EMS. It seems like last seen well at 3pm. Around 4:50pm pt was found to have aphasia, left or right facial droop and tongue was inside mouth not hanging out of mouth as he used to be. Not quite sure if there was arm or leg weakness. However, in ED, no clear focal deficit found. NIHSS = 6. CT no acute abnormality. CTA head and neck no LVO but showed right proximal ICA severe stenosis.  Of note, he did have paroxysmal aflutter, followed by cardiology, not on [facility] due to frequent falls. He also has other stroke risk factors including DM, HLD, CAD/MI.   Pt not TNK candidate given rapid resolving symptoms and no focal deficit. Not IR candidate given no LVO and mild symptoms. Will need admission for further stroke work up and VVS consultation for right ICA possible symptomatic stenosis.  Recommendations:  Continue admission and further stroke work up  Frequent neuro checks Telemetry monitoring MRI brain  Echocardiogram  fasting lipid panel and HgbA1C PT/OT/speech consult Permissive hypertension (only treat if BP > 220/120 unless a lower blood pressure is clinically necessary) for 24-48 hours post stroke onset GI and DVT prophylaxis  ASA PR 300 mg stat  if no po access  Vascular surgery consultation for right ICA high grade stenosis  Discussed with Dr. Rhunette Croft ED physician We will follow  Consult Participants: stroke response RN, RN, pt and EDP, me Location of the provider: [facility] Location of the patient: [facility]  Time Code Stroke Page received:  1710 Time neurologist arrived:  1712 Time NIHSS completed: 1724    This consult was provided via telemedicine with 2-way video and audio communication. The patient/family was  informed that care would be provided in this way and agreed to receive care in this manner.   This patient is receiving care for possible acute neurological changes. There was 80 minutes of care by this provider at the time of service, including time for direct evaluation via telemedicine, review of medical records, imaging studies and discussion of findings with providers, the patient and/or family.  Marvel Plan, MD PhD Stroke Neurology 09/24/2022 6:13 PM

## 2022-09-24 NOTE — ED Notes (Signed)
Pt assisted with using urinal lying in bed, pt pulled up in bed for comfort. Pt repositioned,. Pt family at bedside.

## 2022-09-24 NOTE — Progress Notes (Signed)
Telestroke RN Note 1706- Code stroke activation- EDP at bedside 1712- Dr Roda Shutters on camera for stroke assessment 1715- To CT 1724- NIH completed while in CT. 1740- orders to continue w/ advanced imaging 1743- stroke cart taken so that Dr Roda Shutters can speak to family. 1750- pt returned from CT 1753- no TNK  MRS 4 LKW 1500

## 2022-09-25 ENCOUNTER — Other Ambulatory Visit (HOSPITAL_COMMUNITY): Payer: Self-pay | Admitting: *Deleted

## 2022-09-25 ENCOUNTER — Observation Stay (HOSPITAL_BASED_OUTPATIENT_CLINIC_OR_DEPARTMENT_OTHER): Payer: Medicare Other

## 2022-09-25 DIAGNOSIS — I6521 Occlusion and stenosis of right carotid artery: Secondary | ICD-10-CM | POA: Diagnosis not present

## 2022-09-25 DIAGNOSIS — F17210 Nicotine dependence, cigarettes, uncomplicated: Secondary | ICD-10-CM | POA: Diagnosis not present

## 2022-09-25 DIAGNOSIS — I2510 Atherosclerotic heart disease of native coronary artery without angina pectoris: Secondary | ICD-10-CM | POA: Diagnosis not present

## 2022-09-25 DIAGNOSIS — J449 Chronic obstructive pulmonary disease, unspecified: Secondary | ICD-10-CM | POA: Diagnosis not present

## 2022-09-25 DIAGNOSIS — E1122 Type 2 diabetes mellitus with diabetic chronic kidney disease: Secondary | ICD-10-CM | POA: Diagnosis not present

## 2022-09-25 DIAGNOSIS — I129 Hypertensive chronic kidney disease with stage 1 through stage 4 chronic kidney disease, or unspecified chronic kidney disease: Secondary | ICD-10-CM | POA: Diagnosis not present

## 2022-09-25 DIAGNOSIS — Z79899 Other long term (current) drug therapy: Secondary | ICD-10-CM | POA: Diagnosis not present

## 2022-09-25 DIAGNOSIS — G459 Transient cerebral ischemic attack, unspecified: Secondary | ICD-10-CM | POA: Diagnosis not present

## 2022-09-25 DIAGNOSIS — Z7902 Long term (current) use of antithrombotics/antiplatelets: Secondary | ICD-10-CM | POA: Diagnosis not present

## 2022-09-25 DIAGNOSIS — Z8546 Personal history of malignant neoplasm of prostate: Secondary | ICD-10-CM | POA: Diagnosis not present

## 2022-09-25 DIAGNOSIS — Z85828 Personal history of other malignant neoplasm of skin: Secondary | ICD-10-CM | POA: Diagnosis not present

## 2022-09-25 DIAGNOSIS — Z95818 Presence of other cardiac implants and grafts: Secondary | ICD-10-CM | POA: Diagnosis not present

## 2022-09-25 LAB — GLUCOSE, CAPILLARY
Glucose-Capillary: 84 mg/dL (ref 70–99)
Glucose-Capillary: 150 mg/dL — ABNORMAL HIGH (ref 70–99)

## 2022-09-25 LAB — ECHOCARDIOGRAM COMPLETE
Area-P 1/2: 2.32 cm2
Height: 63 in
S' Lateral: 3.3 cm
Weight: 2589.08 oz

## 2022-09-25 LAB — MAGNESIUM: Magnesium: 2 mg/dL (ref 1.7–2.4)

## 2022-09-25 LAB — COMPREHENSIVE METABOLIC PANEL
ALT: 11 U/L (ref 0–44)
AST: 14 U/L — ABNORMAL LOW (ref 15–41)
Albumin: 3.2 g/dL — ABNORMAL LOW (ref 3.5–5.0)
Alkaline Phosphatase: 56 U/L (ref 38–126)
Anion gap: 11 (ref 5–15)
BUN: 28 mg/dL — ABNORMAL HIGH (ref 8–23)
CO2: 23 mmol/L (ref 22–32)
Calcium: 9 mg/dL (ref 8.9–10.3)
Chloride: 105 mmol/L (ref 98–111)
Creatinine, Ser: 1.58 mg/dL — ABNORMAL HIGH (ref 0.61–1.24)
GFR, Estimated: 42 mL/min — ABNORMAL LOW (ref >60)
Glucose, Bld: 91 mg/dL (ref 70–99)
Potassium: 4.1 mmol/L (ref 3.5–5.1)
Sodium: 139 mmol/L (ref 135–145)
Total Bilirubin: 0.7 mg/dL (ref 0.3–1.2)
Total Protein: 5.7 g/dL — ABNORMAL LOW (ref 6.5–8.1)

## 2022-09-25 LAB — LIPID PANEL
Cholesterol: 84 mg/dL (ref 0–200)
HDL: 33 mg/dL — ABNORMAL LOW (ref >40)
LDL Cholesterol: 34 mg/dL (ref 0–99)
Total CHOL/HDL Ratio: 2.5 RATIO
Triglycerides: 84 mg/dL (ref <150)
VLDL: 17 mg/dL (ref 0–40)

## 2022-09-25 LAB — CBC
HCT: 38.1 % — ABNORMAL LOW (ref 39.0–52.0)
Hemoglobin: 12.3 g/dL — ABNORMAL LOW (ref 13.0–17.0)
MCH: 29.6 pg (ref 26.0–34.0)
MCHC: 32.3 g/dL (ref 30.0–36.0)
MCV: 91.8 fL (ref 80.0–100.0)
Platelets: 160 10*3/uL (ref 150–400)
RBC: 4.15 MIL/uL — ABNORMAL LOW (ref 4.22–5.81)
RDW: 13.5 % (ref 11.5–15.5)
WBC: 6.4 10*3/uL (ref 4.0–10.5)
nRBC: 0 % (ref 0.0–0.2)

## 2022-09-25 LAB — PHOSPHORUS: Phosphorus: 3.1 mg/dL (ref 2.5–4.6)

## 2022-09-25 NOTE — Progress Notes (Signed)
Discharge orders are in, discharge instructions given to patient and daughter contacted to let her know patient is discharged. Waiting for arrival of daughter.

## 2022-09-25 NOTE — Progress Notes (Signed)
MRI brain without contrast personally reviewed.  No acute stroke.  Per Dr. Sherryll Burger, patient denied any aggressive interventions for his right carotid stenosis.  Patient is already on Plavix and rosuvastatin (LDL 34).  Therefore can continue current management.  Goal blood pressure: Normotension.  Avoid hypotension.  Please call neurology for any further questions.  [name] [name]

## 2022-09-25 NOTE — Plan of Care (Signed)
  Problem: Acute Rehab OT Goals (only OT should resolve) Goal: Pt. Will Perform Grooming Flowsheets (Taken 09/25/2022 0937) Pt Will Perform Grooming:  with modified independence  standing Goal: Pt. Will Perform Lower Body Dressing Flowsheets (Taken 09/25/2022 0937) Pt Will Perform Lower Body Dressing:  with modified independence  sitting/lateral leans Goal: Pt. Will Transfer To Toilet Flowsheets (Taken 09/25/2022 [phone]) Pt Will Transfer to Toilet:  with modified independence  ambulating Goal: Pt. Will Perform Toileting-Clothing Manipulation Flowsheets (Taken 09/25/2022 0937) Pt Will Perform Toileting - Clothing Manipulation and hygiene:  with modified independence  sitting/lateral leans Goal: Pt/Caregiver Will Perform Home Exercise Program Flowsheets (Taken 09/25/2022 [phone]) Pt/caregiver will Perform Home Exercise Program:  Increased strength  Both right and left upper extremity  Independently  Bridney Guadarrama OT, MOT

## 2022-09-25 NOTE — Progress Notes (Addendum)
SLP Cancellation Note  Patient Details Name: [name] [name] MRN: [id number] DOB: October 25, 1932   Cancelled treatment:       Reason Eval/Treat Not Completed: SLP screened, no needs identified, will sign off. Slurred speech has resolved. MRI reveals: Motion limited study without evidence of acute abnormality. Pt reports some baseline cognitive impairment; Note some tangential and impulsive behaviors however, these behaviors and are reportedly at or near baseline. Our service will sign off. Thank you,  Jameir Ake H. Romie Levee, CCC-SLP Speech Language Pathologist    Georgetta Haber 09/25/2022, 11:29 AM

## 2022-09-25 NOTE — TOC Transition Note (Signed)
Transition of Care [facility]) - CM/SW Discharge Note   Patient Details  Name: [name] [name] MRN: [id number] Date of Birth: April 01, 1932  [facility] CM/SW Contact:  Karn Cassis, LCSW Phone Number: 09/25/2022, 3:35 PM   Clinical Narrative: Pt from [facility]. D/C today. Will send d/c summary. No FL2 needed per Chasity at facility due to <24 hour observation. Daughter, Mickle Mallory updated and reports someone will pick up pt this afternoon.       Final next level of care: Assisted Living Barriers to Discharge: Barriers Resolved   Patient Goals and CMS Choice   Choice offered to / list presented to : Adult Children  Discharge Placement                  Patient to be transferred to facility by: family Name of family member notified: Mickle Mallory- daughter Patient and family notified of of transfer: 09/25/22  Discharge Plan and Services Additional resources added to the After Visit Summary for                                       Social Determinants of Health (SDOH) Interventions SDOH Screenings   Food Insecurity: No Food Insecurity (09/24/2022)  Housing: Low Risk  (09/24/2022)  Transportation Needs: No Transportation Needs (09/24/2022)  Utilities: Not At Risk (09/24/2022)  Tobacco Use: High Risk (07/06/2022)     Readmission Risk Interventions     No data to display

## 2022-09-25 NOTE — Plan of Care (Signed)
  Problem: Acute Rehab PT Goals(only PT should resolve) Goal: Pt Will Go Supine/Side To Sit Outcome: Progressing Flowsheets (Taken 09/25/2022 1116) Pt will go Supine/Side to Sit: with contact guard assist Goal: Pt Will Go Sit To Supine/Side Outcome: Progressing Flowsheets (Taken 09/25/2022 1116) Pt will go Sit to Supine/Side: with contact guard assist Goal: Patient Will Transfer Sit To/From Stand Outcome: Progressing Flowsheets (Taken 09/25/2022 1116) Patient will transfer sit to/from stand: with contact guard assist Goal: Pt Will Transfer Bed To Chair/Chair To Bed Outcome: Progressing Flowsheets (Taken 09/25/2022 1116) Pt will Transfer Bed to Chair/Chair to Bed: with contact guard assist Goal: Pt Will Ambulate Outcome: Progressing Flowsheets (Taken 09/25/2022 1116) Pt will Ambulate:  > 125 feet  with rolling walker  with contact guard assist    [name] Mccreedy D. Hartnett-Rands, MS, PT Per Diem PT [facility] [phone] 09/25/2022

## 2022-09-25 NOTE — Discharge Summary (Signed)
Physician Discharge Summary  [name] [name] JYN:[id number] DOB: 07-Feb-1932 DOA: 09/24/2022  PCP: Chilton Greathouse, MD  Admit date: 09/24/2022  Discharge date: 09/25/2022  Admitted From:ALF  Disposition:  ALF  Recommendations for Outpatient Follow-up:  Follow up with PCP in 1-2 weeks Continue on Plavix and rosuvastatin 5 mg daily Patient does not desire any further interventions for carotid stenosis Continue other home medications as prior and carefully monitor blood pressures and avoid hypotension  Home Health: Yes with PT/OT  Equipment/Devices: None  Discharge Condition:Stable  CODE STATUS: DNR  Diet recommendation: Heart Healthy  Brief/Interim Summary: [name] [name] is a 89 y.o. male with medical history significant of hypertension, hyperlipidemia, CKD 3, OSA, COPD, GERD, second-degree AV block who presents to the emergency department from SNF via EMS due to suspicion for stroke.  Brain MRI performed and reviewed by neurology with no acute stroke present.  Patient has noted to have severe right carotid stenosis, but does not desire any aggressive interventions for this and is already on Plavix and rosuvastatin which has been recommended to be continued per neurology.  He has been seen by PT with recommendations for home health PT/OT.  He is in stable condition for discharge.  Discharge Diagnoses:  Principal Problem:   TIA (transient ischemic attack) Active Problems:   Diabetes mellitus type 2 in nonobese [facility])   Mixed hyperlipidemia   Essential hypertension   COPD (chronic obstructive pulmonary disease) (HCC)   Tobacco abuse   Coronary artery disease involving native coronary artery of native heart without angina pectoris   Carotid stenosis   GERD (gastroesophageal reflux disease)   Heart block AV second degree   Pulmonary nodule   Prolonged QT interval  Principal discharge diagnosis: TIA  Discharge Instructions  Discharge Instructions     Diet - low sodium heart  healthy   Complete by: As directed    Increase activity slowly   Complete by: As directed       Allergies as of 09/25/2022   No Known Allergies      Medication List     STOP taking these medications    irbesartan 75 MG tablet Commonly known as: AVAPRO       TAKE these medications    albuterol 108 (90 Base) MCG/ACT inhaler Commonly known as: VENTOLIN HFA Inhale 2 puffs into the lungs every 4 (four) hours as needed for wheezing or shortness of breath.   clopidogrel 75 MG tablet Commonly known as: PLAVIX Take 1 tablet (75 mg total) by mouth daily.   clotrimazole 1 % cream Commonly known as: LOTRIMIN Apply 1 Application topically 2 (two) times daily.   D2000 Ultra Strength 50 MCG (2000 UT) Caps Generic drug: Cholecalciferol Take 1 capsule by mouth daily.   diphenoxylate-atropine 2.5-0.025 MG tablet Commonly known as: LOMOTIL Take 1 tablet by mouth 4 (four) times daily as needed for diarrhea or loose stools.   escitalopram 10 MG tablet Commonly known as: LEXAPRO Take 10 mg by mouth daily.   ferrous sulfate 325 (65 FE) MG EC tablet Take 325 mg by mouth daily with breakfast.   irbesartan-hydrochlorothiazide 300-12.5 MG tablet Commonly known as: AVALIDE Take 1 tablet by mouth daily.   Lantus SoloStar 100 UNIT/ML Solostar Pen Generic drug: insulin glargine Inject 7 Units into the skin at bedtime.   loperamide 2 MG capsule Commonly known as: IMODIUM Take 2 mg by mouth 3 (three) times daily as needed for diarrhea or loose stools.   Magnesium 500 MG Tabs Take 500  mg by mouth in the morning.   melatonin 5 MG Tabs Take 5 mg by mouth at bedtime.   pantoprazole 40 MG tablet Commonly known as: PROTONIX Take 1 tablet (40 mg total) by mouth daily.   rosuvastatin 5 MG tablet Commonly known as: CRESTOR Take 5 mg by mouth at bedtime.   Voltaren Arthritis Pain 1 % Gel Generic drug: diclofenac Sodium Apply 2 g topically in the morning, at noon, and at bedtime.         Follow-up Information     Avva, Ravisankar, MD. Schedule an appointment as soon as possible for a visit in 1 week(s).   Specialty: Internal Medicine Contact information: [address] [place] [address] [phone]                No Known Allergies  Consultations: Neurology   Procedures/Studies: ECHOCARDIOGRAM COMPLETE  Result Date: 09/25/2022    ECHOCARDIOGRAM REPORT   Patient Name:   [name] [name] Date of Exam: 09/25/2022 Medical Rec #:  [id number]    Height:       63.0 in Accession #:    [id number]   Weight:       161.8 lb Date of Birth:  [birth year]     BSA:          1.767 m Patient Age:    89 years     BP:           185/72 mmHg Patient Gender: M            HR:           51 bpm. [facility] Procedure: 2D Echo, Cardiac Doppler and Color Doppler Indications:    Stroke l63.9  History:        Patient has prior history of Echocardiogram examinations, most                 recent 01/28/2022. Previous Myocardial Infarction and CAD, COPD                 and TIA, Arrythmias:Heart block AV second degree and RBBB; Risk                 Factors:Hypertension, Diabetes, Dyslipidemia and Tobacco abuse.                 Hx of COVID-19 virus infection.  Sonographer:    Celesta Gentile RCS Referring Phys: [id number] OLADAPO ADEFESO IMPRESSIONS  1. Left ventricular ejection fraction, by estimation, is 55 to 60%. The left ventricle has normal function. The left ventricle has no regional wall motion abnormalities. Left ventricular diastolic parameters are consistent with Grade I diastolic dysfunction (impaired relaxation).  2. Right ventricular systolic function is normal. The right ventricular size is normal. Tricuspid regurgitation signal is inadequate for assessing PA pressure.  3. Left atrial size was moderately dilated.  4. The mitral valve is normal in structure. No evidence of mitral valve regurgitation. No evidence of mitral stenosis.  5. The aortic valve is tricuspid. There is  mild calcification of the aortic valve. Aortic valve regurgitation is not visualized. No aortic stenosis is present.  6. Aortic dilatation noted. There is mild dilatation of the aortic root, measuring 42 mm.  7. The inferior vena cava is normal in size with greater than 50% respiratory variability, suggesting right atrial pressure of 3 mmHg. Comparison(s): No significant change from prior study. FINDINGS  Left Ventricle: Left ventricular ejection fraction, by estimation, is 55 to 60%. The left  mg by mouth in the morning.   melatonin 5 MG Tabs Take 5 mg by mouth at bedtime.   pantoprazole 40 MG tablet Commonly known as: PROTONIX Take 1 tablet (40 mg total) by mouth daily.   rosuvastatin 5 MG tablet Commonly known as: CRESTOR Take 5 mg by mouth at bedtime.   Voltaren Arthritis Pain 1 % Gel Generic drug: diclofenac Sodium Apply 2 g topically in the morning, at noon, and at bedtime.         Follow-up Information     Avva, Ravisankar, MD. Schedule an appointment as soon as possible for a visit in 1 week(s).   Specialty: Internal Medicine Contact information: [address] [place] [address] [phone]                No Known Allergies  Consultations: Neurology   Procedures/Studies: ECHOCARDIOGRAM COMPLETE  Result Date: 09/25/2022    ECHOCARDIOGRAM REPORT   Patient Name:   [name] [name] Date of Exam: 09/25/2022 Medical Rec #:  [id number]    Height:       63.0 in Accession #:    [id number]   Weight:       161.8 lb Date of Birth:  [birth year]     BSA:          1.767 m Patient Age:    89 years     BP:           185/72 mmHg Patient Gender: M            HR:           51 bpm. [facility] Procedure: 2D Echo, Cardiac Doppler and Color Doppler Indications:    Stroke l63.9  History:        Patient has prior history of Echocardiogram examinations, most                 recent 01/28/2022. Previous Myocardial Infarction and CAD, COPD                 and TIA, Arrythmias:Heart block AV second degree and RBBB; Risk                 Factors:Hypertension, Diabetes, Dyslipidemia and Tobacco abuse.                 Hx of COVID-19 virus infection.  Sonographer:    Celesta Gentile RCS Referring Phys: [id number] OLADAPO ADEFESO IMPRESSIONS  1. Left ventricular ejection fraction, by estimation, is 55 to 60%. The left ventricle has normal function. The left ventricle has no regional wall motion abnormalities. Left ventricular diastolic parameters are consistent with Grade I diastolic dysfunction (impaired relaxation).  2. Right ventricular systolic function is normal. The right ventricular size is normal. Tricuspid regurgitation signal is inadequate for assessing PA pressure.  3. Left atrial size was moderately dilated.  4. The mitral valve is normal in structure. No evidence of mitral valve regurgitation. No evidence of mitral stenosis.  5. The aortic valve is tricuspid. There is  mild calcification of the aortic valve. Aortic valve regurgitation is not visualized. No aortic stenosis is present.  6. Aortic dilatation noted. There is mild dilatation of the aortic root, measuring 42 mm.  7. The inferior vena cava is normal in size with greater than 50% respiratory variability, suggesting right atrial pressure of 3 mmHg. Comparison(s): No significant change from prior study. FINDINGS  Left Ventricle: Left ventricular ejection fraction, by estimation, is 55 to 60%. The left  mg by mouth in the morning.   melatonin 5 MG Tabs Take 5 mg by mouth at bedtime.   pantoprazole 40 MG tablet Commonly known as: PROTONIX Take 1 tablet (40 mg total) by mouth daily.   rosuvastatin 5 MG tablet Commonly known as: CRESTOR Take 5 mg by mouth at bedtime.   Voltaren Arthritis Pain 1 % Gel Generic drug: diclofenac Sodium Apply 2 g topically in the morning, at noon, and at bedtime.         Follow-up Information     Avva, Ravisankar, MD. Schedule an appointment as soon as possible for a visit in 1 week(s).   Specialty: Internal Medicine Contact information: [address] [place] [address] [phone]                No Known Allergies  Consultations: Neurology   Procedures/Studies: ECHOCARDIOGRAM COMPLETE  Result Date: 09/25/2022    ECHOCARDIOGRAM REPORT   Patient Name:   [name] [name] Date of Exam: 09/25/2022 Medical Rec #:  [id number]    Height:       63.0 in Accession #:    [id number]   Weight:       161.8 lb Date of Birth:  [birth year]     BSA:          1.767 m Patient Age:    89 years     BP:           185/72 mmHg Patient Gender: M            HR:           51 bpm. [facility] Procedure: 2D Echo, Cardiac Doppler and Color Doppler Indications:    Stroke l63.9  History:        Patient has prior history of Echocardiogram examinations, most                 recent 01/28/2022. Previous Myocardial Infarction and CAD, COPD                 and TIA, Arrythmias:Heart block AV second degree and RBBB; Risk                 Factors:Hypertension, Diabetes, Dyslipidemia and Tobacco abuse.                 Hx of COVID-19 virus infection.  Sonographer:    Celesta Gentile RCS Referring Phys: [id number] OLADAPO ADEFESO IMPRESSIONS  1. Left ventricular ejection fraction, by estimation, is 55 to 60%. The left ventricle has normal function. The left ventricle has no regional wall motion abnormalities. Left ventricular diastolic parameters are consistent with Grade I diastolic dysfunction (impaired relaxation).  2. Right ventricular systolic function is normal. The right ventricular size is normal. Tricuspid regurgitation signal is inadequate for assessing PA pressure.  3. Left atrial size was moderately dilated.  4. The mitral valve is normal in structure. No evidence of mitral valve regurgitation. No evidence of mitral stenosis.  5. The aortic valve is tricuspid. There is  mild calcification of the aortic valve. Aortic valve regurgitation is not visualized. No aortic stenosis is present.  6. Aortic dilatation noted. There is mild dilatation of the aortic root, measuring 42 mm.  7. The inferior vena cava is normal in size with greater than 50% respiratory variability, suggesting right atrial pressure of 3 mmHg. Comparison(s): No significant change from prior study. FINDINGS  Left Ventricle: Left ventricular ejection fraction, by estimation, is 55 to 60%. The left  Physician Discharge Summary  [name] [name] JYN:[id number] DOB: 07-Feb-1932 DOA: 09/24/2022  PCP: Chilton Greathouse, MD  Admit date: 09/24/2022  Discharge date: 09/25/2022  Admitted From:ALF  Disposition:  ALF  Recommendations for Outpatient Follow-up:  Follow up with PCP in 1-2 weeks Continue on Plavix and rosuvastatin 5 mg daily Patient does not desire any further interventions for carotid stenosis Continue other home medications as prior and carefully monitor blood pressures and avoid hypotension  Home Health: Yes with PT/OT  Equipment/Devices: None  Discharge Condition:Stable  CODE STATUS: DNR  Diet recommendation: Heart Healthy  Brief/Interim Summary: [name] [name] is a 89 y.o. male with medical history significant of hypertension, hyperlipidemia, CKD 3, OSA, COPD, GERD, second-degree AV block who presents to the emergency department from SNF via EMS due to suspicion for stroke.  Brain MRI performed and reviewed by neurology with no acute stroke present.  Patient has noted to have severe right carotid stenosis, but does not desire any aggressive interventions for this and is already on Plavix and rosuvastatin which has been recommended to be continued per neurology.  He has been seen by PT with recommendations for home health PT/OT.  He is in stable condition for discharge.  Discharge Diagnoses:  Principal Problem:   TIA (transient ischemic attack) Active Problems:   Diabetes mellitus type 2 in nonobese [facility])   Mixed hyperlipidemia   Essential hypertension   COPD (chronic obstructive pulmonary disease) (HCC)   Tobacco abuse   Coronary artery disease involving native coronary artery of native heart without angina pectoris   Carotid stenosis   GERD (gastroesophageal reflux disease)   Heart block AV second degree   Pulmonary nodule   Prolonged QT interval  Principal discharge diagnosis: TIA  Discharge Instructions  Discharge Instructions     Diet - low sodium heart  healthy   Complete by: As directed    Increase activity slowly   Complete by: As directed       Allergies as of 09/25/2022   No Known Allergies      Medication List     STOP taking these medications    irbesartan 75 MG tablet Commonly known as: AVAPRO       TAKE these medications    albuterol 108 (90 Base) MCG/ACT inhaler Commonly known as: VENTOLIN HFA Inhale 2 puffs into the lungs every 4 (four) hours as needed for wheezing or shortness of breath.   clopidogrel 75 MG tablet Commonly known as: PLAVIX Take 1 tablet (75 mg total) by mouth daily.   clotrimazole 1 % cream Commonly known as: LOTRIMIN Apply 1 Application topically 2 (two) times daily.   D2000 Ultra Strength 50 MCG (2000 UT) Caps Generic drug: Cholecalciferol Take 1 capsule by mouth daily.   diphenoxylate-atropine 2.5-0.025 MG tablet Commonly known as: LOMOTIL Take 1 tablet by mouth 4 (four) times daily as needed for diarrhea or loose stools.   escitalopram 10 MG tablet Commonly known as: LEXAPRO Take 10 mg by mouth daily.   ferrous sulfate 325 (65 FE) MG EC tablet Take 325 mg by mouth daily with breakfast.   irbesartan-hydrochlorothiazide 300-12.5 MG tablet Commonly known as: AVALIDE Take 1 tablet by mouth daily.   Lantus SoloStar 100 UNIT/ML Solostar Pen Generic drug: insulin glargine Inject 7 Units into the skin at bedtime.   loperamide 2 MG capsule Commonly known as: IMODIUM Take 2 mg by mouth 3 (three) times daily as needed for diarrhea or loose stools.   Magnesium 500 MG Tabs Take 500  mg by mouth in the morning.   melatonin 5 MG Tabs Take 5 mg by mouth at bedtime.   pantoprazole 40 MG tablet Commonly known as: PROTONIX Take 1 tablet (40 mg total) by mouth daily.   rosuvastatin 5 MG tablet Commonly known as: CRESTOR Take 5 mg by mouth at bedtime.   Voltaren Arthritis Pain 1 % Gel Generic drug: diclofenac Sodium Apply 2 g topically in the morning, at noon, and at bedtime.         Follow-up Information     Avva, Ravisankar, MD. Schedule an appointment as soon as possible for a visit in 1 week(s).   Specialty: Internal Medicine Contact information: [address] [place] [address] [phone]                No Known Allergies  Consultations: Neurology   Procedures/Studies: ECHOCARDIOGRAM COMPLETE  Result Date: 09/25/2022    ECHOCARDIOGRAM REPORT   Patient Name:   [name] [name] Date of Exam: 09/25/2022 Medical Rec #:  [id number]    Height:       63.0 in Accession #:    [id number]   Weight:       161.8 lb Date of Birth:  [birth year]     BSA:          1.767 m Patient Age:    89 years     BP:           185/72 mmHg Patient Gender: M            HR:           51 bpm. [facility] Procedure: 2D Echo, Cardiac Doppler and Color Doppler Indications:    Stroke l63.9  History:        Patient has prior history of Echocardiogram examinations, most                 recent 01/28/2022. Previous Myocardial Infarction and CAD, COPD                 and TIA, Arrythmias:Heart block AV second degree and RBBB; Risk                 Factors:Hypertension, Diabetes, Dyslipidemia and Tobacco abuse.                 Hx of COVID-19 virus infection.  Sonographer:    Celesta Gentile RCS Referring Phys: [id number] OLADAPO ADEFESO IMPRESSIONS  1. Left ventricular ejection fraction, by estimation, is 55 to 60%. The left ventricle has normal function. The left ventricle has no regional wall motion abnormalities. Left ventricular diastolic parameters are consistent with Grade I diastolic dysfunction (impaired relaxation).  2. Right ventricular systolic function is normal. The right ventricular size is normal. Tricuspid regurgitation signal is inadequate for assessing PA pressure.  3. Left atrial size was moderately dilated.  4. The mitral valve is normal in structure. No evidence of mitral valve regurgitation. No evidence of mitral stenosis.  5. The aortic valve is tricuspid. There is  mild calcification of the aortic valve. Aortic valve regurgitation is not visualized. No aortic stenosis is present.  6. Aortic dilatation noted. There is mild dilatation of the aortic root, measuring 42 mm.  7. The inferior vena cava is normal in size with greater than 50% respiratory variability, suggesting right atrial pressure of 3 mmHg. Comparison(s): No significant change from prior study. FINDINGS  Left Ventricle: Left ventricular ejection fraction, by estimation, is 55 to 60%. The left

## 2022-09-25 NOTE — Evaluation (Signed)
Physical Therapy Evaluation Patient Details Name: MAYANK BARDER MRN: [id number] DOB: 07-Jul-1932 Today's Date: 09/25/2022  History of Present Illness  [name] [name] is a 89 y.o. male with medical history significant of hypertension, hyperlipidemia, CKD 3, OSA, COPD, GERD, second-degree AV block who presents to the emergency department from SNF via EMS due to suspicion for stroke.  Patient was unable to provide a history, history was obtained from ED physician and ED medical record.  Per report, last known normal was at 3 PM at the time of shift change, on being assessed by the nursing staff, he was noted with left sided facial droop, slurred speech and right sided grip weakness.  At baseline, patient has right-sided tongue protrusion, he ambulates with a walker and was able to carry on a conversation.  Patient was alert at bedside, but he thought that he was still at the nursing home.  He denies chest pain, shortness of breath, fever, chills, nausea, vomiting. (per DO).   Clinical Impression  Pt admitted with above diagnosis. Patient supine in bed and agreeable to participating in evaluation upon therapist arrival. Patient required assistance with supine to sit with HOB elevated. Patient required cues for hand placement and step sequencing for sit to stand transfer using RW. Patient required cues to walk within the RW base of support and to stand more erect during ambulation. Patient demonstrated impulsive tendencies during mobility decreasing his safety and increasing his risk for falls. Contact guard assist given for all upright mobility for safety in an unfamiliar environment. Pt currently with functional limitations due to the deficits listed below (see PT Problem List). Pt will benefit from acute skilled PT to increase their independence and safety with mobility to allow discharge.           If plan is discharge home, recommend the following: A little help with walking and/or transfers;Supervision due  to cognitive status;A little help with bathing/dressing/bathroom;Help with stairs or ramp for entrance;Assist for transportation;Assistance with cooking/housework   Can travel by private vehicle        Equipment Recommendations None recommended by PT  Recommendations for Other Services       Functional Status Assessment Patient has had a recent decline in their functional status and demonstrates the ability to make significant improvements in function in a reasonable and predictable amount of time.     Precautions / Restrictions Precautions Precautions: Fall Precaution Comments: patient reports multiple falls over the last six months Restrictions Weight Bearing Restrictions: No      Mobility  Bed Mobility Overal bed mobility: Needs Assistance Bed Mobility: Supine to Sit     Supine to sit: Min assist, Contact guard, HOB elevated     General bed mobility comments: labored effort, multiple attempts using ballistic back and forth movements and unable to complete trunk upright without min assist    Transfers Overall transfer level: Needs assistance Equipment used: Rolling walker (2 wheels) Transfers: Sit to/from Stand, Bed to chair/wheelchair/BSC Sit to Stand: Contact guard assist   Step pivot transfers: Contact guard assist       General transfer comment: Labored movement; unsteady in standing mildly with RW; cues for step sequencing and hand placement with use of RW    Ambulation/Gait Ambulation/Gait assistance: Contact guard assist Gait Distance (Feet): 90 Feet Assistive device: Rolling walker (2 wheels) Gait Pattern/deviations: Step-through pattern, Decreased step length - right, Decreased step length - left, Decreased stride length, Wide base of support, Trunk flexed Gait velocity: decreased  General Gait Details: somewhat slow labored cadence with RW demonstrating impulsive movements; cues to walk within base of support and stand more upright; on room air  throughout session  Stairs            Wheelchair Mobility     Tilt Bed    Modified Rankin (Stroke Patients Only)       Balance Overall balance assessment: Needs assistance Sitting-balance support: No upper extremity supported, Feet supported Sitting balance-Leahy Scale: Good Sitting balance - Comments: seated at EOB   Standing balance support: Bilateral upper extremity supported, During functional activity, Reliant on assistive device for balance Standing balance-Leahy Scale: Fair Standing balance comment: using RW         Pertinent Vitals/Pain Pain Assessment Pain Assessment: No/denies pain    Home Living Family/patient expects to be discharged to:: Assisted living     Home Equipment: Agricultural consultant (2 wheels);Wheelchair - manual      Prior Function Prior Level of Function : Needs assist       Physical Assist : ADLs (physical);Mobility (physical) Mobility (physical): Transfers;Gait ADLs (physical): Bathing;Dressing;IADLs Mobility Comments: RW ambulation with supervision ADLs Comments: PRN asssist for bathing and dressing. Assist IADL's.     Extremity/Trunk Assessment   Upper Extremity Assessment Upper Extremity Assessment: Defer to OT evaluation LUE Deficits / Details: LUE deficit at baseline LUE Sensation: WNL LUE Coordination: WNL    Lower Extremity Assessment Lower Extremity Assessment: Generalized weakness    Cervical / Trunk Assessment Cervical / Trunk Assessment: Kyphotic  Communication   Communication Communication: No apparent difficulties Cueing Techniques: Verbal cues;Gestural cues;Visual cues  Cognition Arousal: Alert Behavior During Therapy: WFL for tasks assessed/performed, Impulsive Overall Cognitive Status: Within Functional Limits for tasks assessed        General Comments      Exercises     Assessment/Plan    PT Assessment Patient needs continued PT services  PT Problem List Decreased strength;Decreased  knowledge of use of DME;Decreased activity tolerance;Decreased balance;Decreased mobility;Decreased safety awareness;Decreased cognition       PT Treatment Interventions DME instruction;Cognitive remediation;Gait training;Patient/family education;Functional mobility training;Therapeutic activities;Therapeutic exercise;Balance training    PT Goals (Current goals can be found in the Care Plan section)  Acute Rehab PT Goals Patient Stated Goal: Go back to ALF with HHPT. PT Goal Formulation: With patient Time For Goal Achievement: 10/09/22 Potential to Achieve Goals: Good    Frequency Min 3X/week        AM-PAC PT "6 Clicks" Mobility  Outcome Measure Help needed turning from your back to your side while in a flat bed without using bedrails?: None Help needed moving from lying on your back to sitting on the side of a flat bed without using bedrails?: A Little Help needed moving to and from a bed to a chair (including a wheelchair)?: A Little Help needed standing up from a chair using your arms (e.g., wheelchair or bedside chair)?: A Little Help needed to walk in hospital room?: A Little Help needed climbing 3-5 steps with a railing? : A Little 6 Click Score: 19    End of Session Equipment Utilized During Treatment: Gait belt Activity Tolerance: Patient tolerated treatment well;Patient limited by fatigue Patient left: in chair;with call bell/phone within reach;with chair alarm set;with nursing/sitter in room Nurse Communication: Mobility status PT Visit Diagnosis: Unsteadiness on feet (R26.81);Other abnormalities of gait and mobility (R26.89);History of falling (Z91.81);Muscle weakness (generalized) (M62.81)    Time: 1191-4782 PT Time Calculation (min) (ACUTE ONLY): 23 min   Charges:  PT Evaluation $PT Eval Low Complexity: 1 Low PT Treatments $Therapeutic Activity: 8-22 mins PT General Charges $$ ACUTE PT VISIT: 1 Visit         Katina Dung. Hartnett-Rands, MS, PT [place]  PT [facility] [phone]  Britta Mccreedy  Hartnett-Rands 09/25/2022, 11:12 AM

## 2022-09-25 NOTE — Evaluation (Signed)
Occupational Therapy Evaluation Patient Details Name: [name] [name] MRN: [id number] DOB: [birth year] Today's Date: 09/25/2022   History of Present Illness [name] [name] is a 89 y.o. male with medical history significant of hypertension, hyperlipidemia, CKD 3, OSA, COPD, GERD, second-degree AV block who presents to the emergency department from SNF via EMS due to suspicion for stroke.  Patient was unable to provide a history, history was obtained from ED physician and ED medical record.  Per report, last known normal was at 3 PM at the time of shift change, on being assessed by the nursing staff, he was noted with left sided facial droop, slurred speech and right sided grip weakness.  At baseline, patient has right-sided tongue protrusion, he ambulates with a walker and was able to carry on a conversation.  Patient was alert at bedside, but he thought that he was still at the nursing home.  He denies chest pain, shortness of breath, fever, chills, nausea, vomiting. (per DO)   Clinical Impression   Pt agreeable to OT evaluation. Pt came from SNF facility with reports of assist for bathing and dressing as well as supervision assist for mobility with RW. Pt demonstrated need for contact guard to min A for transfers with generalized weakness. Pt was able to completed lower body dressing with time and effort seated at EOB. Pt was left in the bed with the call bell within reach. Pt will benefit from continued OT in the hospital and recommended venue below to increase strength, balance, and endurance for safe ADL's.          If plan is discharge home, recommend the following: A little help with walking and/or transfers;A little help with bathing/dressing/bathroom;Assistance with cooking/housework;Help with stairs or ramp for entrance;Assist for transportation    Functional Status Assessment  Patient has had a recent decline in their functional status and demonstrates the ability to make significant  improvements in function in a reasonable and predictable amount of time.  Equipment Recommendations  None recommended by OT    Recommendations for Other Services       Precautions / Restrictions Precautions Precautions: Fall Restrictions Weight Bearing Restrictions: No      Mobility Bed Mobility Overal bed mobility: Needs Assistance Bed Mobility: Supine to Sit, Sit to Supine     Supine to sit: Supervision Sit to supine: Supervision   General bed mobility comments: labored effort    Transfers Overall transfer level: Needs assistance Equipment used: Rolling walker (2 wheels) Transfers: Sit to/from Stand, Bed to chair/wheelchair/BSC Sit to Stand: Contact guard assist, Min assist     Step pivot transfers: Contact guard assist     General transfer comment: Labored movement; unsteady in standing mildly with RW      Balance Overall balance assessment: Needs assistance Sitting-balance support: No upper extremity supported, Feet supported Sitting balance-Leahy Scale: Good Sitting balance - Comments: seated at EOB   Standing balance support: Bilateral upper extremity supported, During functional activity, Reliant on assistive device for balance Standing balance-Leahy Scale: Fair Standing balance comment: using RW                           ADL either performed or assessed with clinical judgement   ADL Overall ADL's : Needs assistance/impaired     Grooming: Contact guard assist;Minimal assistance;Standing       Lower Body Bathing: Supervison/ safety;Sitting/lateral leans   Upper Body Dressing : Set up;Sitting   Lower Body Dressing: Supervision/safety;Sitting/lateral  leans Lower Body Dressing Details (indicate cue type and reason): Able to doff and don sock seated at EOB with extended time and effort. Toilet Transfer: Contact guard assist;Rolling walker (2 wheels);Ambulation;Minimal assistance Toilet Transfer Details (indicate cue type and reason): EOB  to toilet and back. Toileting- Clothing Manipulation and Hygiene: Set up;Contact guard assist;Sitting/lateral lean;Sit to/from stand       Functional mobility during ADLs: Contact guard assist;Rolling walker (2 wheels);Minimal assistance       Vision Baseline Vision/History: 0 No visual deficits Ability to See in Adequate Light: 0 Adequate Patient Visual Report: No change from baseline Vision Assessment?: No apparent visual deficits     Perception Perception: Not tested       Praxis Praxis: WFL       Pertinent Vitals/Pain Pain Assessment Pain Assessment: No/denies pain     Extremity/Trunk Assessment Upper Extremity Assessment Upper Extremity Assessment: Generalized weakness;LUE deficits/detail LUE Deficits / Details: LUE deficit at baseline LUE Sensation: WNL LUE Coordination: WNL   Lower Extremity Assessment Lower Extremity Assessment: Defer to PT evaluation   Cervical / Trunk Assessment Cervical / Trunk Assessment: Kyphotic   Communication Communication Communication: No apparent difficulties   Cognition Arousal: Alert Behavior During Therapy: WFL for tasks assessed/performed Overall Cognitive Status: Within Functional Limits for tasks assessed                                                        Home Living Family/patient expects to be discharged to:: Skilled nursing facility                                        Prior Functioning/Environment Prior Level of Function : Needs assist       Physical Assist : ADLs (physical);Mobility (physical) Mobility (physical): Transfers;Gait ADLs (physical): Bathing;Dressing;IADLs Mobility Comments: RW ambulation with supervision ADLs Comments: PRN asssist for bathing and dressing. Assist IADL's.        OT Problem List: Decreased strength;Decreased activity tolerance;Impaired balance (sitting and/or standing)      OT Treatment/Interventions: Self-care/ADL  training;Therapeutic exercise;Therapeutic activities;Patient/family education;Balance training    OT Goals(Current goals can be found in the care plan section) Acute Rehab OT Goals Patient Stated Goal: return to SNF OT Goal Formulation: With patient Time For Goal Achievement: 10/09/22 Potential to Achieve Goals: Good  OT Frequency: Min 1X/week                  AM-PAC OT "6 Clicks" Daily Activity     Outcome Measure Help from another person eating meals?: None Help from another person taking care of personal grooming?: A Little Help from another person toileting, which includes using toliet, bedpan, or urinal?: A Little Help from another person bathing (including washing, rinsing, drying)?: A Little Help from another person to put on and taking off regular upper body clothing?: A Little Help from another person to put on and taking off regular lower body clothing?: A Little 6 Click Score: 19   End of Session Equipment Utilized During Treatment: Rolling walker (2 wheels);Gait belt  Activity Tolerance: Patient tolerated treatment well Patient left: with call bell/phone within reach;in bed;with bed alarm set  OT Visit Diagnosis: Unsteadiness on feet (R26.81);Other abnormalities of gait and mobility (R26.89);Muscle  weakness (generalized) (M62.81)                Time: 0812-0829 OT Time Calculation (min): 17 min Charges:  OT General Charges $OT Visit: 1 Visit OT Evaluation $OT Eval Low Complexity: 1 Low  Emilyanne Mcgough OT, MOT  Danie Chandler 09/25/2022, 9:35 AM

## 2022-09-25 NOTE — Progress Notes (Signed)
*  PRELIMINARY RESULTS* Echocardiogram 2D Echocardiogram has been performed.  [name] [name] 09/25/2022, 1:21 PM

## 2022-09-25 NOTE — Plan of Care (Signed)
Problem: Education: Goal: Knowledge of disease or condition will improve Outcome: Not Progressing Goal: Knowledge of secondary prevention will improve (MUST DOCUMENT ALL) Outcome: Not Progressing Goal: Knowledge of patient specific risk factors will improve Loraine Leriche N/A or DELETE if not current risk factor) Outcome: Not Progressing   Problem: Ischemic Stroke/TIA Tissue Perfusion: Goal: Complications of ischemic stroke/TIA will be minimized Outcome: Not Progressing   Problem: Coping: Goal: Will verbalize positive feelings about self Outcome: Not Progressing Goal: Will identify appropriate support needs Outcome: Not Progressing   Problem: Health Behavior/Discharge Planning: Goal: Ability to manage health-related needs will improve Outcome: Not Progressing Goal: Goals will be collaboratively established with patient/family Outcome: Not Progressing   Problem: Self-Care: Goal: Ability to participate in self-care as condition permits will improve Outcome: Not Progressing Goal: Verbalization of feelings and concerns over difficulty with self-care will improve Outcome: Not Progressing Goal: Ability to communicate needs accurately will improve Outcome: Not Progressing   Problem: Nutrition: Goal: Risk of aspiration will decrease Outcome: Not Progressing Goal: Dietary intake will improve Outcome: Not Progressing   Problem: Education: Goal: Knowledge of General Education information will improve Description: Including pain rating scale, medication(s)/side effects and non-pharmacologic comfort measures Outcome: Not Progressing   Problem: Health Behavior/Discharge Planning: Goal: Ability to manage health-related needs will improve Outcome: Not Progressing   Problem: Clinical Measurements: Goal: Ability to maintain clinical measurements within normal limits will improve Outcome: Not Progressing Goal: Will remain free from infection Outcome: Not Progressing Goal: Diagnostic test  results will improve Outcome: Not Progressing Goal: Respiratory complications will improve Outcome: Not Progressing Goal: Cardiovascular complication will be avoided Outcome: Not Progressing   Problem: Activity: Goal: Risk for activity intolerance will decrease Outcome: Not Progressing   Problem: Nutrition: Goal: Adequate nutrition will be maintained Outcome: Not Progressing   Problem: Coping: Goal: Level of anxiety will decrease Outcome: Not Progressing   Problem: Elimination: Goal: Will not experience complications related to bowel motility Outcome: Not Progressing Goal: Will not experience complications related to urinary retention Outcome: Not Progressing   Problem: Pain Managment: Goal: General experience of comfort will improve Outcome: Not Progressing   Problem: Safety: Goal: Ability to remain free from injury will improve Outcome: Not Progressing   Problem: Skin Integrity: Goal: Risk for impaired skin integrity will decrease Outcome: Not Progressing   Problem: Education: Goal: Ability to describe self-care measures that may prevent or decrease complications (Diabetes Survival Skills Education) will improve Outcome: Not Progressing Goal: Individualized Educational Video(s) Outcome: Not Progressing   Problem: Coping: Goal: Ability to adjust to condition or change in health will improve Outcome: Not Progressing   Problem: Fluid Volume: Goal: Ability to maintain a balanced intake and output will improve Outcome: Not Progressing   Problem: Health Behavior/Discharge Planning: Goal: Ability to identify and utilize available resources and services will improve Outcome: Not Progressing Goal: Ability to manage health-related needs will improve Outcome: Not Progressing   Problem: Metabolic: Goal: Ability to maintain appropriate glucose levels will improve Outcome: Not Progressing   Problem: Nutritional: Goal: Maintenance of adequate nutrition will  improve Outcome: Not Progressing Goal: Progress toward achieving an optimal weight will improve Outcome: Not Progressing   Problem: Skin Integrity: Goal: Risk for impaired skin integrity will decrease Outcome: Not Progressing   Problem: Tissue Perfusion: Goal: Adequacy of tissue perfusion will improve Outcome: Not Progressing

## 2022-09-26 LAB — HEMOGLOBIN A1C
Hgb A1c MFr Bld: 5.9 % — ABNORMAL HIGH (ref 4.8–5.6)
Mean Plasma Glucose: 123 mg/dL

## 2022-09-29 DIAGNOSIS — G459 Transient cerebral ischemic attack, unspecified: Secondary | ICD-10-CM | POA: Diagnosis not present

## 2022-09-29 DIAGNOSIS — I6529 Occlusion and stenosis of unspecified carotid artery: Secondary | ICD-10-CM | POA: Diagnosis not present

## 2022-09-29 DIAGNOSIS — M6281 Muscle weakness (generalized): Secondary | ICD-10-CM | POA: Diagnosis not present

## 2022-09-30 DIAGNOSIS — M6281 Muscle weakness (generalized): Secondary | ICD-10-CM | POA: Diagnosis not present

## 2022-09-30 DIAGNOSIS — R262 Difficulty in walking, not elsewhere classified: Secondary | ICD-10-CM | POA: Diagnosis not present

## 2022-10-01 DIAGNOSIS — M6281 Muscle weakness (generalized): Secondary | ICD-10-CM | POA: Diagnosis not present

## 2022-10-01 DIAGNOSIS — R262 Difficulty in walking, not elsewhere classified: Secondary | ICD-10-CM | POA: Diagnosis not present

## 2022-10-02 DIAGNOSIS — M6281 Muscle weakness (generalized): Secondary | ICD-10-CM | POA: Diagnosis not present

## 2022-10-02 DIAGNOSIS — R262 Difficulty in walking, not elsewhere classified: Secondary | ICD-10-CM | POA: Diagnosis not present

## 2022-10-03 ENCOUNTER — Encounter (HOSPITAL_COMMUNITY): Payer: Self-pay | Admitting: *Deleted

## 2022-10-03 ENCOUNTER — Emergency Department (HOSPITAL_COMMUNITY)
Admission: EM | Admit: 2022-10-03 | Discharge: 2022-10-07 | Disposition: A | Discharge status: Skilled Nursing Facility | Payer: Medicare Other | Source: Home / Self Care | Attending: Emergency Medicine | Admitting: Emergency Medicine

## 2022-10-03 ENCOUNTER — Encounter (HOSPITAL_COMMUNITY): Payer: Self-pay

## 2022-10-03 ENCOUNTER — Emergency Department (HOSPITAL_COMMUNITY): Payer: Medicare Other

## 2022-10-03 ENCOUNTER — Emergency Department (HOSPITAL_COMMUNITY): Admission: EM | Admit: 2022-10-03 | Discharge: 2022-10-03 | Payer: Medicare Other

## 2022-10-03 ENCOUNTER — Other Ambulatory Visit: Payer: Self-pay

## 2022-10-03 DIAGNOSIS — W19XXXA Unspecified fall, initial encounter: Secondary | ICD-10-CM

## 2022-10-03 DIAGNOSIS — I129 Hypertensive chronic kidney disease with stage 1 through stage 4 chronic kidney disease, or unspecified chronic kidney disease: Secondary | ICD-10-CM | POA: Insufficient documentation

## 2022-10-03 DIAGNOSIS — E1122 Type 2 diabetes mellitus with diabetic chronic kidney disease: Secondary | ICD-10-CM | POA: Insufficient documentation

## 2022-10-03 DIAGNOSIS — W050XXA Fall from non-moving wheelchair, initial encounter: Secondary | ICD-10-CM | POA: Insufficient documentation

## 2022-10-03 DIAGNOSIS — R1110 Vomiting, unspecified: Secondary | ICD-10-CM | POA: Insufficient documentation

## 2022-10-03 DIAGNOSIS — N189 Chronic kidney disease, unspecified: Secondary | ICD-10-CM | POA: Insufficient documentation

## 2022-10-03 DIAGNOSIS — J449 Chronic obstructive pulmonary disease, unspecified: Secondary | ICD-10-CM | POA: Insufficient documentation

## 2022-10-03 DIAGNOSIS — K627 Radiation proctitis: Secondary | ICD-10-CM | POA: Diagnosis not present

## 2022-10-03 DIAGNOSIS — Z7902 Long term (current) use of antithrombotics/antiplatelets: Secondary | ICD-10-CM | POA: Insufficient documentation

## 2022-10-03 DIAGNOSIS — S2242XA Multiple fractures of ribs, left side, initial encounter for closed fracture: Secondary | ICD-10-CM | POA: Diagnosis not present

## 2022-10-03 DIAGNOSIS — R410 Disorientation, unspecified: Secondary | ICD-10-CM | POA: Diagnosis not present

## 2022-10-03 DIAGNOSIS — R911 Solitary pulmonary nodule: Secondary | ICD-10-CM | POA: Insufficient documentation

## 2022-10-03 DIAGNOSIS — S0031XA Abrasion of nose, initial encounter: Secondary | ICD-10-CM | POA: Diagnosis not present

## 2022-10-03 DIAGNOSIS — R4182 Altered mental status, unspecified: Secondary | ICD-10-CM | POA: Diagnosis not present

## 2022-10-03 DIAGNOSIS — Z043 Encounter for examination and observation following other accident: Secondary | ICD-10-CM | POA: Diagnosis not present

## 2022-10-03 DIAGNOSIS — R9082 White matter disease, unspecified: Secondary | ICD-10-CM | POA: Diagnosis not present

## 2022-10-03 DIAGNOSIS — R000 Tachycardia, unspecified: Secondary | ICD-10-CM | POA: Diagnosis not present

## 2022-10-03 DIAGNOSIS — K5730 Diverticulosis of large intestine without perforation or abscess without bleeding: Secondary | ICD-10-CM | POA: Diagnosis not present

## 2022-10-03 DIAGNOSIS — R918 Other nonspecific abnormal finding of lung field: Secondary | ICD-10-CM | POA: Diagnosis not present

## 2022-10-03 DIAGNOSIS — J42 Unspecified chronic bronchitis: Secondary | ICD-10-CM | POA: Diagnosis not present

## 2022-10-03 DIAGNOSIS — I6523 Occlusion and stenosis of bilateral carotid arteries: Secondary | ICD-10-CM | POA: Diagnosis not present

## 2022-10-03 DIAGNOSIS — R9089 Other abnormal findings on diagnostic imaging of central nervous system: Secondary | ICD-10-CM | POA: Diagnosis not present

## 2022-10-03 DIAGNOSIS — I10 Essential (primary) hypertension: Secondary | ICD-10-CM | POA: Diagnosis not present

## 2022-10-03 DIAGNOSIS — Z794 Long term (current) use of insulin: Secondary | ICD-10-CM | POA: Insufficient documentation

## 2022-10-03 DIAGNOSIS — I517 Cardiomegaly: Secondary | ICD-10-CM | POA: Diagnosis not present

## 2022-10-03 DIAGNOSIS — I159 Secondary hypertension, unspecified: Secondary | ICD-10-CM | POA: Insufficient documentation

## 2022-10-03 DIAGNOSIS — R451 Restlessness and agitation: Secondary | ICD-10-CM | POA: Diagnosis not present

## 2022-10-03 DIAGNOSIS — I771 Stricture of artery: Secondary | ICD-10-CM | POA: Diagnosis not present

## 2022-10-03 DIAGNOSIS — R1111 Vomiting without nausea: Secondary | ICD-10-CM | POA: Diagnosis not present

## 2022-10-03 DIAGNOSIS — R634 Abnormal weight loss: Secondary | ICD-10-CM | POA: Diagnosis not present

## 2022-10-03 DIAGNOSIS — I6782 Cerebral ischemia: Secondary | ICD-10-CM | POA: Diagnosis not present

## 2022-10-03 DIAGNOSIS — R519 Headache, unspecified: Secondary | ICD-10-CM | POA: Diagnosis not present

## 2022-10-03 LAB — COMPREHENSIVE METABOLIC PANEL
ALT: 14 U/L (ref 0–44)
AST: 18 U/L (ref 15–41)
Albumin: 3.9 g/dL (ref 3.5–5.0)
Alkaline Phosphatase: 66 U/L (ref 38–126)
Anion gap: 14 (ref 5–15)
BUN: 31 mg/dL — ABNORMAL HIGH (ref 8–23)
CO2: 21 mmol/L — ABNORMAL LOW (ref 22–32)
Calcium: 9.3 mg/dL (ref 8.9–10.3)
Chloride: 103 mmol/L (ref 98–111)
Creatinine, Ser: 1.73 mg/dL — ABNORMAL HIGH (ref 0.61–1.24)
GFR, Estimated: 38 mL/min — ABNORMAL LOW (ref >60)
Glucose, Bld: 160 mg/dL — ABNORMAL HIGH (ref 70–99)
Potassium: 4.4 mmol/L (ref 3.5–5.1)
Sodium: 138 mmol/L (ref 135–145)
Total Bilirubin: 0.8 mg/dL (ref 0.3–1.2)
Total Protein: 6.9 g/dL (ref 6.5–8.1)

## 2022-10-03 LAB — CBC WITH DIFFERENTIAL/PLATELET
Abs Immature Granulocytes: 0.03 10*3/uL (ref 0.00–0.07)
Basophils Absolute: 0 10*3/uL (ref 0.0–0.1)
Basophils Relative: 0 %
Eosinophils Absolute: 0 10*3/uL (ref 0.0–0.5)
Eosinophils Relative: 0 %
HCT: 43.5 % (ref 39.0–52.0)
Hemoglobin: 14 g/dL (ref 13.0–17.0)
Immature Granulocytes: 0 %
Lymphocytes Relative: 8 %
Lymphs Abs: 0.9 10*3/uL (ref 0.7–4.0)
MCH: 29.9 pg (ref 26.0–34.0)
MCHC: 32.2 g/dL (ref 30.0–36.0)
MCV: 92.9 fL (ref 80.0–100.0)
Monocytes Absolute: 0.6 10*3/uL (ref 0.1–1.0)
Monocytes Relative: 5 %
Neutro Abs: 9.3 10*3/uL — ABNORMAL HIGH (ref 1.7–7.7)
Neutrophils Relative %: 87 %
Platelets: 178 10*3/uL (ref 150–400)
RBC: 4.68 MIL/uL (ref 4.22–5.81)
RDW: 13.7 % (ref 11.5–15.5)
WBC: 10.9 10*3/uL — ABNORMAL HIGH (ref 4.0–10.5)
nRBC: 0 % (ref 0.0–0.2)

## 2022-10-03 LAB — URINALYSIS, ROUTINE W REFLEX MICROSCOPIC
Bacteria, UA: NONE SEEN
Bilirubin Urine: NEGATIVE
Glucose, UA: 50 mg/dL — AB
Ketones, ur: NEGATIVE mg/dL
Leukocytes,Ua: NEGATIVE
Nitrite: NEGATIVE
Protein, ur: >=300 mg/dL — AB
Specific Gravity, Urine: 1.014 (ref 1.005–1.030)
pH: 6 (ref 5.0–8.0)

## 2022-10-03 LAB — LIPASE, BLOOD: Lipase: 20 U/L (ref 11–51)

## 2022-10-03 MED ORDER — VITAMIN D 25 MCG (1000 UNIT) PO TABS
2000.0000 [IU] | ORAL_TABLET | Freq: Every day | ORAL | Status: DC
  Administered 2022-10-04 – 2022-10-07 (×4): 2000 [IU] via ORAL
  Filled 2022-10-03 (×4): qty 2

## 2022-10-03 MED ORDER — CLOPIDOGREL BISULFATE 75 MG PO TABS
75.0000 mg | ORAL_TABLET | Freq: Every day | ORAL | Status: DC
  Administered 2022-10-04 – 2022-10-07 (×4): 75 mg via ORAL
  Filled 2022-10-03 (×4): qty 1

## 2022-10-03 MED ORDER — ZIPRASIDONE MESYLATE 20 MG IM SOLR
20.0000 mg | Freq: Once | INTRAMUSCULAR | Status: AC
  Administered 2022-10-03: 20 mg via INTRAMUSCULAR
  Filled 2022-10-03: qty 20

## 2022-10-03 MED ORDER — STERILE WATER FOR INJECTION IJ SOLN
INTRAMUSCULAR | Status: AC
  Filled 2022-10-03: qty 10

## 2022-10-03 MED ORDER — PROCHLORPERAZINE EDISYLATE 10 MG/2ML IJ SOLN: 5.0000 mg | Freq: Once | INTRAMUSCULAR | Status: DC

## 2022-10-03 MED ORDER — INSULIN GLARGINE-YFGN 100 UNIT/ML ~~LOC~~ SOLN
7.0000 [IU] | Freq: Every day | SUBCUTANEOUS | Status: DC
  Filled 2022-10-03: qty 0.07

## 2022-10-03 MED ORDER — LACTATED RINGERS IV BOLUS
1000.0000 mL | Freq: Once | INTRAVENOUS | Status: AC
  Administered 2022-10-03: 1000 mL via INTRAVENOUS

## 2022-10-03 MED ORDER — ROSUVASTATIN CALCIUM 5 MG PO TABS
5.0000 mg | ORAL_TABLET | Freq: Every day | ORAL | Status: DC
  Administered 2022-10-04 – 2022-10-06 (×3): 5 mg via ORAL
  Filled 2022-10-03 (×3): qty 1

## 2022-10-03 MED ORDER — ALBUTEROL SULFATE HFA 108 (90 BASE) MCG/ACT IN AERS: 2.0000 | INHALATION_SPRAY | RESPIRATORY_TRACT | Status: DC | PRN

## 2022-10-03 MED ORDER — ALBUTEROL SULFATE (2.5 MG/3ML) 0.083% IN NEBU
2.5000 mg | INHALATION_SOLUTION | RESPIRATORY_TRACT | Status: DC | PRN
  Administered 2022-10-05 – 2022-10-07 (×2): 2.5 mg via RESPIRATORY_TRACT
  Filled 2022-10-03 (×2): qty 3

## 2022-10-03 NOTE — ED Notes (Signed)
Pt found to be 88% on room air while sleeping; pt placed on O2 at 2L and sats increased to 92%; pt resting with loud snoring respirations

## 2022-10-03 NOTE — ED Notes (Signed)
Called [facility] and gave report on pt and will have to call back as he is leaving since he is on the transportation list

## 2022-10-03 NOTE — ED Notes (Signed)
Put pt on 2L O2 due to his O2 dropping into mid to upper 80's. Will titrate down PRN.

## 2022-10-03 NOTE — Discharge Instructions (Addendum)
Your images showed:   "Increasing spiculated right upper lobe lung nodule since April  highly suspicious for Bronchogenic Carcinoma. Consider one of the  following: follow-up PET-CT, repeat Chest CT in 3 months, or tissue  sampling."   Please follow-up with your primary doctor for further testing to investigate possible lung cancer.  Your blood pressure is also elevated today.  Please take your blood pressure medication as prescribed.  Follow-up with your primary doctor regarding possible medication adjustment.

## 2022-10-03 NOTE — Discharge Instructions (Addendum)
Your lab work looks reassuring today.  Your episode of vomiting earlier may have been due to possible concussion.   Please work with physical therapy at your nursing facility to help decrease your risk of falls  Your imaging from earlier today showed a lung nodule.  Please follow-up with your PCP within the next 2 weeks to obtain further evaluation.  The imaging results were as below: "Increasing spiculated right upper lobe lung nodule since April highly suspicious for Bronchogenic Carcinoma. Consider one of the following: follow-up PET-CT, repeat Chest CT in 3 months, or tissue sampling. REF: Radiology 2017; [id number]."  Return the ER for any severe headache, uncontrolled nausea or vomiting, changes in vision, any other new or concerning symptoms.

## 2022-10-03 NOTE — ED Notes (Signed)
Pt sleeping with blanket over head Checked on pt and pt requested to be left alone  Still waiting for med listed from [facility] to complete med rec Insulin canceled and PA informed. Family stated pt has been off it for months

## 2022-10-03 NOTE — ED Notes (Signed)
Pt was sitting in wheelchair in hallway Stated he had to urinate Brought pt into restroom for privacy with urinal Pt would not urinate in urinal Pt unable to be directed and was confused. Stood in front of toilet and attempted to urinate standing up, noted brief was full of urine. Held urinal in front of pt, pt would not urinate in toilet or in urinal. Pt then sat on toilet backwards with seat up.  Pants were submersed in toilet.  Pt did not pee or poop.  Assisted pt back into wheelchair  Moved to room 16 Straight cath for urine with 2 additional staff members to assist.  Per MD, if urine is fine pt is to be discharged, if urine shows UTI, admit for UTI with confusion.

## 2022-10-03 NOTE — ED Notes (Signed)
Spoke to [employer] and family several times today  [name] [name] told family that he was on his way back and their [name] [name] runs until 5pm  [name] [name] told this RN that [name] [name] is not running after 3:30. Informed [name] [name] of that conversation that they had with family, then [name] [name] stated that since he is non-ambulatory that they would not pick pt up  Son and daughter called and informed [name] [name] and [name] [name] set up family member to pick up pt. ETA less than 15 mins   Primary RN informed.

## 2022-10-03 NOTE — ED Notes (Signed)
Not pt tried covering self, attempted to help.  Pt grabbed blanket to use as toilet paper, pt had large loose stool on stretcher  Pt now following commands and rolled from side to side and allowed staff to clean Pt now relaxing comfortably, covered on stretcher

## 2022-10-03 NOTE — ED Notes (Signed)
Secretary adding pt to convalescent list to go back to [facility]

## 2022-10-03 NOTE — ED Notes (Signed)
Spoke to the [employer] is on the other line with family trying to arrange transport back to facility

## 2022-10-03 NOTE — ED Notes (Signed)
Pt now trying to get out of bed Pt not redirectable Pt yelling at staff PA at bedside Verbal order given for [facility] Medicated per [facility]

## 2022-10-03 NOTE — ED Notes (Signed)
Rounding  Pt's diaper on floor Pt stated he did not want to wear it Pt did not want to be covered

## 2022-10-03 NOTE — ED Triage Notes (Signed)
Pt BIB [facility] from [facility] for a fall; pt states he slid out of bed this am; pt c/o slight headache and c/o being cold  Pt has small laceration on nose

## 2022-10-03 NOTE — ED Notes (Signed)
Was told that [facility] contacted family to pick up pt and was not able to get any of his family to come get him, and will have to wait for EMS transportation.

## 2022-10-03 NOTE — ED Notes (Signed)
Spoke with pt daughter and gave updates on pt

## 2022-10-03 NOTE — ED Notes (Signed)
Report called to Chast[facility] at [facility] to call back regarding transport

## 2022-10-03 NOTE — ED Notes (Signed)
Spoke with son at bedside regarding discharge of pt

## 2022-10-03 NOTE — ED Triage Notes (Signed)
Pt discharged today Got to [facility] and vomited Sent by [facility] 160 BP 164/88

## 2022-10-03 NOTE — ED Provider Notes (Signed)
[place] EMERGENCY DEPARTMENT AT [facility] Provider Note   CSN: [id number] Arrival date & time: 10/03/22  9562     History  Chief Complaint  Patient presents with   [name] [name]    [name] [name] is a 89 y.o. male.  89 year old male presented from assisted living facility with mechanical fall.  Reportedly slipped out of wheelchair forward onto face.  No LOC.  Patient complains of pain to his head.  No vision changes, no chest pain no shortness of breath no abdominal pain no pain to extremities.  He is on Plavix   Fall       Home Medications Prior to Admission medications   Medication Sig Start Date End Date Taking? Authorizing Provider  albuterol (VENTOLIN HFA) 108 (90 Base) MCG/ACT inhaler Inhale 2 puffs into the lungs every 4 (four) hours as needed for wheezing or shortness of breath. Patient not taking: Reported on 09/25/2022    [provider]  clopidogrel (PLAVIX) 75 MG tablet Take 1 tablet (75 mg total) by mouth daily. 01/31/14   [name], MD  clotrimazole (LOTRIMIN) 1 % cream Apply 1 Application topically 2 (two) times daily. 05/26/22   [provider]  D2000 ULTRA STRENGTH 50 MCG (2000 UT) CAPS Take 1 capsule by mouth daily. 07/28/22   [provider]  diphenoxylate-atropine (LOMOTIL) 2.5-0.025 MG tablet Take 1 tablet by mouth 4 (four) times daily as needed for diarrhea or loose stools. 04/17/22   Osvaldo Shipper, MD  escitalopram (LEXAPRO) 10 MG tablet Take 10 mg by mouth daily. 12/02/21   [provider]  ferrous sulfate 325 (65 FE) MG EC tablet Take 325 mg by mouth daily with breakfast.     [provider]  irbesartan-hydrochlorothiazide (AVALIDE) 300-12.5 MG per tablet Take 1 tablet by mouth daily. 01/31/14   [name], MD  LANTUS SOLOSTAR 100 UNIT/ML Solostar Pen Inject 7 Units into the skin at bedtime. 02/06/22   Tyrone Nine, MD  loperamide (IMODIUM) 2 MG capsule Take 2 mg by mouth 3 (three) times daily as  needed for diarrhea or loose stools. 08/11/22   [provider]  Magnesium 500 MG TABS Take 500 mg by mouth in the morning.    [provider]  melatonin 5 MG TABS Take 5 mg by mouth at bedtime.    [provider]  pantoprazole (PROTONIX) 40 MG tablet Take 1 tablet (40 mg total) by mouth daily. 02/06/22   Tyrone Nine, MD  rosuvastatin (CRESTOR) 5 MG tablet Take 5 mg by mouth at bedtime. 11/12/17   [provider]  VOLTAREN ARTHRITIS PAIN 1 % GEL Apply 2 g topically in the morning, at noon, and at bedtime. 09/15/22   [provider]      Allergies    Patient has no known allergies.    Review of Systems   Review of Systems  Physical Exam Updated Vital Signs BP (!) 197/77 (BP Location: Left Arm)   Pulse 68   Temp (!) 97.4 F (36.3 C) (Oral)   Resp (!) 29   Ht 5\' 3"  (1.6 m)   Wt 73.4 kg   SpO2 93%   BMI 28.66 kg/m  Physical Exam Vitals and nursing note reviewed.  Constitutional:      Appearance: Normal appearance.  HENT:     Head: Normocephalic.     Comments: Minor abrasion to the bridge of nose. no septal hematoma.    Mouth/Throat:     Mouth: Mucous membranes  are moist.  Eyes:     Extraocular Movements: Extraocular movements intact.     Conjunctiva/sclera: Conjunctivae normal.     Pupils: Pupils are equal, round, and reactive to light.  Cardiovascular:     Rate and Rhythm: Normal rate and regular rhythm.  Pulmonary:     Effort: Pulmonary effort is normal.  Abdominal:     General: Abdomen is flat. There is no distension.     Palpations: Abdomen is soft.     Tenderness: There is no abdominal tenderness.  Musculoskeletal:     Comments: Chest wall stable nontender, pelvis stable nontender.  Good strength in extremities; No bony tenderness.  Equal pulses in all extremities.  Skin:    General: Skin is warm and dry.     Capillary Refill: Capillary refill takes less than 2 seconds.  Neurological:     Mental Status: He is alert and  oriented to person, place, and time.  Psychiatric:        Mood and Affect: Mood normal.        Behavior: Behavior normal.     ED Results / Procedures / Treatments   Labs (all labs ordered are listed, but only abnormal results are displayed) Labs Reviewed - No data to display  EKG None  Radiology CT Cervical Spine Wo Contrast  Result Date: 10/03/2022 CLINICAL DATA:  89 year old male status post fall from bed. Headache. Laceration on nose. EXAM: CT CERVICAL SPINE WITHOUT CONTRAST TECHNIQUE: Multidetector CT imaging of the cervical spine was performed without intravenous contrast. Multiplanar CT image reconstructions were also generated. RADIATION DOSE REDUCTION: This exam was performed according to the departmental dose-optimization program which includes automated exposure control, adjustment of the mA and/or kV according to patient size and/or use of iterative reconstruction technique. COMPARISON:  CT head today.  CT cervical spine 01/27/2022. FINDINGS: Alignment: Stable chronic straightening of cervical lordosis. Cervicothoracic junction alignment is within normal limits. Bilateral posterior element alignment is within normal limits. Skull base and vertebrae: Visualized skull base is intact. No atlanto-occipital dissociation. C1 and C2 appear chronically degenerated, especially on the left but intact and aligned. No acute osseous abnormality identified. Soft tissues and spinal canal: No prevertebral fluid or swelling. No visible canal hematoma. Bulky calcified cervical carotid atherosclerosis bilaterally. Disc levels: Chronic severe and widespread cervical spine degeneration. Chronic C4-C5 ankylosis with some ossification of the posterior longitudinal ligament also. Multilevel chronic spinal stenosis including at the cervicomedullary junction, C3-C4 through C6-C7. Upper chest: Visible upper thoracic levels appears stable including subtle T2 superior endplate compression. Chronic left lateral upper  rib fractures. Calcified aortic atherosclerosis. Respiratory motion, upper lungs appear well aerated. However, there is a spiculated medial right upper lung nodule, 1.6 cm increased since 04/17/2022. IMPRESSION: 1. No acute traumatic injury identified in the cervical spine. Chronic severe cervical spine degeneration, multilevel spinal stenosis. 2. Increasing spiculated right upper lobe lung nodule since April highly suspicious for Bronchogenic Carcinoma. Consider one of the following: follow-up PET-CT, repeat Chest CT in 3 months, or tissue sampling. REF: Radiology 2017; [id number]. 3.  Aortic Atherosclerosis (ICD10-I70.0). Electronically Signed   By: Odessa Fleming M.D.   On: 10/03/2022 09:23   CT Head Wo Contrast  Result Date: 10/03/2022 CLINICAL DATA:  89 year old male status post fall from bed. Headache. Laceration on nose. EXAM: CT HEAD WITHOUT CONTRAST TECHNIQUE: Contiguous axial images were obtained from the base of the skull through the vertex without intravenous contrast. RADIATION DOSE REDUCTION: This exam was performed according to the departmental dose-optimization program  which includes automated exposure control, adjustment of the mA and/or kV according to patient size and/or use of iterative reconstruction technique. COMPARISON:  Brain MRI 09/24/2022.  Head CT 09/24/2022. FINDINGS: Brain: Less motion artifact today. No midline shift, mass effect, or evidence of intracranial mass lesion. No ventriculomegaly. No acute intracranial hemorrhage identified. No cortically based acute infarct identified. Confluent bilateral cerebral white matter hypodensity with some deep white matter capsule involvement. Vascular: Calcified atherosclerosis at the skull base. No suspicious intracranial vascular hyperdensity. Skull: No acute osseous abnormality identified. Sinuses/Orbits: Visualized paranasal sinuses and mastoids are stable and well aerated. Other: No acute orbit or scalp soft tissue injury identified.  IMPRESSION: 1. No acute intracranial abnormality or traumatic injury identified. 2. Chronic cerebral white matter disease, most commonly due to small vessel ischemia. Electronically Signed   By: Odessa Fleming M.D.   On: 10/03/2022 09:17   DG Pelvis 1-2 Views  Result Date: 10/03/2022 CLINICAL DATA:  Fall. EXAM: PELVIS - 1-2 VIEW COMPARISON:  None Available. FINDINGS: Pelvic bony ring is intact. No gross abnormality to either hip. Diffuse atherosclerotic calcifications. Small amount of gas in the rectum. IMPRESSION: No acute bone abnormality to the pelvis. Electronically Signed   By: Richarda Overlie M.D.   On: 10/03/2022 08:45   DG Chest Portable 1 View  Result Date: 10/03/2022 CLINICAL DATA:  Fall.  Coughing up thick mucus EXAM: PORTABLE CHEST 1 VIEW COMPARISON:  04/16/2022 FINDINGS: Generous heart size with aortic tortuosity, stable. Chronic interstitial coarsening which is likely bronchitic by prior CT imaging. There is no edema, consolidation, effusion, or pneumothorax. Remote left rib fractures which are numerous IMPRESSION: Chronic bronchitic markings and borderline cardiomegaly. No acute finding when compared to prior. Electronically Signed   By: Tiburcio Pea M.D.   On: 10/03/2022 08:43    Procedures Procedures    Medications Ordered in ED Medications - No data to display  ED Course/ Medical Decision Making/ A&P Clinical Course as of 10/03/22 0940  Fri Oct 03, 2022  0935 Imaging reviewed.  No acute pathology noted.  Did note lung nodule, patient made aware of need for further outpatient imaging. Patient hypertensive, but asymptomatic.  He is at his reported normal baseline.  Feel that he is appropriate for discharge at this time. [TY]    Clinical Course User Index [TY] Coral Spikes, DO                                 Medical Decision Making Well-appearing 89 year old male present emergency department after mechanical fall at assisted living.  Hypertensive, vital signs otherwise  reassuring.  Per chart review was recently in hospital for TIA/stroke.  Physical exam reassuring has minor abrasion to the bridge of nose, however does not require suture repair.  He has no other complaints.  Will get imaging given patient's advanced age to rule out traumatic pathology.  Given mechanical nature and patient's lack of symptoms low suspicion for acute intra-abdominal trauma/pathology.  Will forego lab work at this time.  See ED course for further MDM disposition.  Amount and/or Complexity of Data Reviewed Radiology: ordered.         Final Clinical Impression(s) / ED Diagnoses Final diagnoses:  Fall, initial encounter  Lung nodule  Secondary hypertension    Rx / DC Orders ED Discharge Orders     None         Coral Spikes, DO 10/03/22 0940

## 2022-10-03 NOTE — ED Notes (Signed)
Pt son came to ED to see him and he was sleeping. Pt son asked when [facility] would be picking pt [facility]. I told son I had talked to [facility] and given report and was never told anything about them coming to pick up pt. So, son left, I called [facility] back, spoke with Sue Lush who then said they will send a ride for the patient.

## 2022-10-03 NOTE — ED Provider Notes (Cosign Needed Addendum)
[place] EMERGENCY DEPARTMENT AT [facility] Provider Note   CSN: [id number] Arrival date & time: 10/03/22  1233     History  Chief Complaint  Patient presents with   Emesis    SHERMAN DONALDSON is a 89 y.o. male with type 2 diabetes, hypertension, COPD, CKD, who was recently seen this morning for a mechanical fall, brought in by EMS for an episode of vomiting after he left the ER today.  When talking to the patient, he states he drank something that made him throw up back at his living facility.  He denies any feelings of nausea or vomiting currently.  Denies any abdominal pain, changes in vision, headache, any other concerns. Patient is on Plavix   Emesis      Home Medications Prior to Admission medications   Medication Sig Start Date End Date Taking? Authorizing Provider  escitalopram (LEXAPRO) 10 MG tablet Take 10 mg by mouth daily. 12/02/21  Yes [provider]  ferrous sulfate 325 (65 FE) MG EC tablet Take 325 mg by mouth daily with breakfast.    Yes [provider]  irbesartan-hydrochlorothiazide (AVALIDE) 300-12.5 MG per tablet Take 1 tablet by mouth daily. 01/31/14  Yes [name], MD  loperamide (IMODIUM) 2 MG capsule Take 2 mg by mouth 3 (three) times daily as needed for diarrhea or loose stools. 08/11/22  Yes [provider]  melatonin 5 MG TABS Take 5 mg by mouth at bedtime.   Yes [provider]  pantoprazole (PROTONIX) 40 MG tablet Take 1 tablet (40 mg total) by mouth daily. 02/06/22  Yes Tyrone Nine, MD  rosuvastatin (CRESTOR) 5 MG tablet Take 5 mg by mouth at bedtime. 11/12/17  Yes [provider]  VOLTAREN ARTHRITIS PAIN 1 % GEL Apply 2 g topically in the morning, at noon, and at bedtime. 09/15/22  Yes [provider]      Allergies    Patient has no known allergies.    Review of Systems   Review of Systems  Gastrointestinal:  Positive for vomiting.    Physical Exam Updated Vital Signs BP (!)  97/44 (BP Location: Right Arm)   Pulse 61   Temp 98 F (36.7 C) (Oral)   Resp 18   Ht 5\' 3"  (1.6 m)   Wt 71.7 kg   SpO2 98%   BMI 27.99 kg/m  Physical Exam Vitals and nursing note reviewed.  Constitutional:      General: He is not in acute distress.    Appearance: He is well-developed.     Comments: In no acute distress, no vomiting  HENT:     Head: Normocephalic and atraumatic.  Eyes:     Conjunctiva/sclera: Conjunctivae normal.  Cardiovascular:     Rate and Rhythm: Normal rate and regular rhythm.     Heart sounds: No murmur heard. Pulmonary:     Effort: Pulmonary effort is normal. No respiratory distress.     Breath sounds: Normal breath sounds.  Abdominal:     General: Abdomen is flat.     Palpations: Abdomen is soft.     Tenderness: There is no abdominal tenderness.  Musculoskeletal:        General: No swelling.     Cervical back: Neck supple.  Skin:    General: Skin is warm and dry.     Capillary Refill: Capillary refill takes less than 2 seconds.  Neurological:     Mental Status: He is alert.     Comments:  Alert and oriented to self and place, not to time  Psychiatric:        Mood and Affect: Mood normal.     ED Results / Procedures / Treatments   Labs (all labs ordered are listed, but only abnormal results are displayed) Labs Reviewed  CBC WITH DIFFERENTIAL/PLATELET - Abnormal; Notable for the following components:      Result Value   WBC 10.9 (*)    Neutro Abs 9.3 (*)    All other components within normal limits  COMPREHENSIVE METABOLIC PANEL - Abnormal; Notable for the following components:   CO2 21 (*)    Glucose, Bld 160 (*)    BUN 31 (*)    Creatinine, Ser 1.73 (*)    GFR, Estimated 38 (*)    All other components within normal limits  URINALYSIS, ROUTINE W REFLEX MICROSCOPIC - Abnormal; Notable for the following components:   Glucose, UA 50 (*)    Hgb urine dipstick MODERATE (*)    Protein, ur >=300 (*)    All other components within normal  limits  LIPASE, BLOOD    EKG None  Radiology CT Cervical Spine Wo Contrast  Result Date: 10/03/2022 CLINICAL DATA:  89 year old male status post fall from bed. Headache. Laceration on nose. EXAM: CT CERVICAL SPINE WITHOUT CONTRAST TECHNIQUE: Multidetector CT imaging of the cervical spine was performed without intravenous contrast. Multiplanar CT image reconstructions were also generated. RADIATION DOSE REDUCTION: This exam was performed according to the departmental dose-optimization program which includes automated exposure control, adjustment of the mA and/or kV according to patient size and/or use of iterative reconstruction technique. COMPARISON:  CT head today.  CT cervical spine 01/27/2022. FINDINGS: Alignment: Stable chronic straightening of cervical lordosis. Cervicothoracic junction alignment is within normal limits. Bilateral posterior element alignment is within normal limits. Skull base and vertebrae: Visualized skull base is intact. No atlanto-occipital dissociation. C1 and C2 appear chronically degenerated, especially on the left but intact and aligned. No acute osseous abnormality identified. Soft tissues and spinal canal: No prevertebral fluid or swelling. No visible canal hematoma. Bulky calcified cervical carotid atherosclerosis bilaterally. Disc levels: Chronic severe and widespread cervical spine degeneration. Chronic C4-C5 ankylosis with some ossification of the posterior longitudinal ligament also. Multilevel chronic spinal stenosis including at the cervicomedullary junction, C3-C4 through C6-C7. Upper chest: Visible upper thoracic levels appears stable including subtle T2 superior endplate compression. Chronic left lateral upper rib fractures. Calcified aortic atherosclerosis. Respiratory motion, upper lungs appear well aerated. However, there is a spiculated medial right upper lung nodule, 1.6 cm increased since 04/17/2022. IMPRESSION: 1. No acute traumatic injury identified in the  cervical spine. Chronic severe cervical spine degeneration, multilevel spinal stenosis. 2. Increasing spiculated right upper lobe lung nodule since April highly suspicious for Bronchogenic Carcinoma. Consider one of the following: follow-up PET-CT, repeat Chest CT in 3 months, or tissue sampling. REF: Radiology 2017; [id number]. 3.  Aortic Atherosclerosis (ICD10-I70.0). Electronically Signed   By: Odessa Fleming M.D.   On: 10/03/2022 09:23   CT Head Wo Contrast  Result Date: 10/03/2022 CLINICAL DATA:  89 year old male status post fall from bed. Headache. Laceration on nose. EXAM: CT HEAD WITHOUT CONTRAST TECHNIQUE: Contiguous axial images were obtained from the base of the skull through the vertex without intravenous contrast. RADIATION DOSE REDUCTION: This exam was performed according to the departmental dose-optimization program which includes automated exposure control, adjustment of the mA and/or kV according to patient size and/or use of iterative reconstruction technique. COMPARISON:  Brain MRI 09/24/2022.  Head CT 09/24/2022. FINDINGS: Brain: Less motion artifact today. No midline shift, mass effect, or evidence of intracranial mass lesion. No ventriculomegaly. No acute intracranial hemorrhage identified. No cortically based acute infarct identified. Confluent bilateral cerebral white matter hypodensity with some deep white matter capsule involvement. Vascular: Calcified atherosclerosis at the skull base. No suspicious intracranial vascular hyperdensity. Skull: No acute osseous abnormality identified. Sinuses/Orbits: Visualized paranasal sinuses and mastoids are stable and well aerated. Other: No acute orbit or scalp soft tissue injury identified. IMPRESSION: 1. No acute intracranial abnormality or traumatic injury identified. 2. Chronic cerebral white matter disease, most commonly due to small vessel ischemia. Electronically Signed   By: Odessa Fleming M.D.   On: 10/03/2022 09:17   DG Pelvis 1-2 Views  Result  Date: 10/03/2022 CLINICAL DATA:  Fall. EXAM: PELVIS - 1-2 VIEW COMPARISON:  None Available. FINDINGS: Pelvic bony ring is intact. No gross abnormality to either hip. Diffuse atherosclerotic calcifications. Small amount of gas in the rectum. IMPRESSION: No acute bone abnormality to the pelvis. Electronically Signed   By: Richarda Overlie M.D.   On: 10/03/2022 08:45   DG Chest Portable 1 View  Result Date: 10/03/2022 CLINICAL DATA:  Fall.  Coughing up thick mucus EXAM: PORTABLE CHEST 1 VIEW COMPARISON:  04/16/2022 FINDINGS: Generous heart size with aortic tortuosity, stable. Chronic interstitial coarsening which is likely bronchitic by prior CT imaging. There is no edema, consolidation, effusion, or pneumothorax. Remote left rib fractures which are numerous IMPRESSION: Chronic bronchitic markings and borderline cardiomegaly. No acute finding when compared to prior. Electronically Signed   By: Tiburcio Pea M.D.   On: 10/03/2022 08:43    Procedures Procedures    Medications Ordered in ED Medications  clopidogrel (PLAVIX) tablet 75 mg (75 mg Oral Not Given 10/03/22 2130)  cholecalciferol (VITAMIN D3) 25 MCG (1000 UNIT) tablet 2,000 Units (2,000 Units Oral Not Given 10/03/22 2130)  rosuvastatin (CRESTOR) tablet 5 mg (5 mg Oral Not Given 10/03/22 2131)  albuterol (PROVENTIL) (2.5 MG/3ML) 0.083% nebulizer solution 2.5 mg (has no administration in time range)  ziprasidone (GEODON) injection 20 mg (20 mg Intramuscular Given 10/03/22 2129)  sterile water (preservative free) injection (  Given 10/03/22 2129)  lactated ringers bolus 1,000 mL (1,000 mLs Intravenous New Bag/Given 10/03/22 2305)    ED Course/ Medical Decision Making/ A&P                                 Medical Decision Making Amount and/or Complexity of Data Reviewed Labs: ordered.  Risk OTC drugs. Prescription drug management.   89 y.o. male with pertinent past medical history of type 2 diabetes, hypertension, COPD, CKD presents to the ED  for concern of 1 episode of vomiting earlier today  Differential diagnosis includes but is not limited to concussion, intracranial hemorrhage, appendicitis, cholecystitis, pancreatitis, migraine  ED Course:  Patient brought back to the ER via EMS due to a episode of emesis when he got back to his nursing facility.  Upon evaluation the patient, he is well-appearing with no active emesis.  Denies any feelings of nausea.  Denies any pain.  Reports drinking something at the nursing facility which made him throw up.  CBC, CMP, lipase were obtained which showed no acute abnormalities.  He was able to tolerate p.o. intake without difficulty.  Suspect the episode of vomiting may have been secondary to concussion or possible drink that did not sit well with him.  He was observed for over an hour in the ER with normal vital signs and no further episodes of emesis.  I spoke to the patient's daughter to update her.  Appropriate for discharge home at this time. Zofran initially considered for possible nausea but held off due to prolonged QT on prior EKG I reviewed patient's imaging from earlier today including CT head which showed no acute intracranial abnormality, no need for further imaging.  CT cervical spine revealed right upper lobe lung nodule concerning for bronco genic carcinoma.  I informed patient that he will need to with his PCP to get further imaging in 3 months to follow-up on this.  ADDENDUM 6:40PM I was notified by nursing that the family had concerns for patient going back to his assisted living facility.  Upon talking to the family, they note concern that the patient is having difficulty walking with his walker, and is not his normal self. I reviewed the results of the workup with family and Dr. Deretha Emory and I talked to the family that patient may need social work consult and PT consult to facilitate higher level of care.  Added on a urinalysis to evaluate for possible urinary tract infection.   However, no other indication at this time to admit.  Addendum 7:20 PM.  Patient urinalysis with no signs of UTI.  I have ordered patient's home meds.  He will be placed in boarder status and social work consult and PT consult has been placed.  Addendum 10:52 PM patient was very agitated earlier and trying to get out of bed.  There is concern he could hurt himself if he got out of bed, specially given the mechanical fall earlier today.  He was given 20 of Geodon IM started on safety observation. I was then notified by nursing that his BP lowered. Still responsive. Will give LR bolus.   Impression: Emesis prior to arrival, now resolved  Disposition:  The patient was discharged home with instructions to work with PT regarding transfer safety to minimize fall risk.  Follow-up with PCP regarding the right upper lobe nodule seen on his imaging today. Return precautions given.  Lab Tests: I Ordered, and personally interpreted labs.  The pertinent results include:   CBC with slight leukocytosis at 10.9 CMP with elevated creatinine consistent with patient's baseline Lipase within normal limits  External records from outside source obtained and reviewed including ER visit from earlier today where he was evaluated for a mechanical fall.  CT head, cervical spine, pelvis, chest x-ray was completed without any acute abnormalities  I spoke to the patient's daughter on the phone, Luther Bradley to update her that her father was here in the ER.  I answered all questions.              Final Clinical Impression(s) / ED Diagnoses Final diagnoses:  Vomiting, unspecified vomiting type, unspecified whether nausea present    Rx / DC Orders ED Discharge Orders     None         Arabella Merles, PA-C 10/03/22 1439    Coral Spikes, DO 10/03/22 1540    Arabella Merles, PA-C 10/03/22 1843    Arabella Merles, PA-C 10/03/22 1921    Arabella Merles, PA-C 10/03/22 2345     Coral Spikes, DO 10/06/22 [phone]

## 2022-10-03 NOTE — ED Notes (Signed)
Pt sleeping, remains covered

## 2022-10-04 DIAGNOSIS — R1110 Vomiting, unspecified: Secondary | ICD-10-CM | POA: Diagnosis not present

## 2022-10-04 DIAGNOSIS — S0031XA Abrasion of nose, initial encounter: Secondary | ICD-10-CM | POA: Diagnosis not present

## 2022-10-04 DIAGNOSIS — R451 Restlessness and agitation: Secondary | ICD-10-CM | POA: Diagnosis not present

## 2022-10-04 MED ORDER — MELATONIN 5 MG PO TABS
5.0000 mg | ORAL_TABLET | Freq: Every day | ORAL | Status: DC
  Filled 2022-10-04 (×2): qty 1

## 2022-10-04 MED ORDER — MELATONIN 3 MG PO TABS
4.5000 mg | ORAL_TABLET | Freq: Every day | ORAL | Status: DC
  Administered 2022-10-05 – 2022-10-06 (×3): 4.5 mg via ORAL
  Filled 2022-10-04 (×3): qty 2

## 2022-10-04 NOTE — ED Provider Notes (Signed)
Emergency Medicine Observation Re-evaluation Note  [name] [name] is a 89 y.o. male, seen on rounds today.  Pt initially presented to the ED for complaints of Emesis Currently, the patient is sleeping.  Physical Exam  BP (!) 178/76 (BP Location: Right Arm)   Pulse 71   Temp (!) 97.3 F (36.3 C) (Oral)   Resp 20   Ht 5\' 3"  (1.6 m)   Wt 71.7 kg   SpO2 97%   BMI 27.99 kg/m  Physical Exam General: Sleeping Cardiac: Extremities well-perfused Lungs: Breathing is unlabored Psych: Deferred  ED Course / MDM  EKG:   I have reviewed the labs performed to date as well as medications administered while in observation.  Recent changes in the last 24 hours include presentation to the emergency department twice yesterday.  Initially, this was for a mechanical fall.  Shortly after his discharge, he had an episode of vomiting back at his nursing facility and was sent back to the ED.  PT and TOC were consulted to assess for possible need for higher level of care.  Plan  Current plan is for PT and social work evaluation.    Gloris Manchester, MD 10/04/22 [phone]

## 2022-10-04 NOTE — Progress Notes (Addendum)
   10/04/22 1442  TOC Brief Assessment  Insurance and Status Reviewed  Patient has primary care physician Yes  Home environment has been reviewed Arrived from [facility]  Prior level of function: Assisted Living  Prior/Current Home Services No current home services  Social Determinants of Health Reivew SDOH reviewed no interventions necessary  Readmission risk has been reviewed Yes  Transition of care needs transition of care needs identified, TOC will continue to follow   Family is requesting assistance with SNF referral as pt cannot return to [facility] due to current needs. TOC will initiate referral to SNF.   Addendum: CSW spoke to daughter Latina while she was at the bedside. Family first choice for SNF is [facility], then [facility]- this is ALF so pt may not be ready since cannot return to [facility]. Last choice is [facility]. CSW requested bed at [facility] and [facility].  PASSR #[id number] A. FL2 complete waiting for co-sign.

## 2022-10-04 NOTE — Plan of Care (Signed)
  Problem: Acute Rehab PT Goals(only PT should resolve) Goal: Pt Will Go Supine/Side To Sit Outcome: Progressing Flowsheets (Taken 10/04/2022 1212) Pt will go Supine/Side to Sit: with contact guard assist Goal: Patient Will Transfer Sit To/From Stand Outcome: Progressing Flowsheets (Taken 10/04/2022 1212) Patient will transfer sit to/from stand:  with contact guard assist  with minimal assist Goal: Pt Will Transfer Bed To Chair/Chair To Bed Outcome: Progressing Flowsheets (Taken 10/04/2022 1212) Pt will Transfer Bed to Chair/Chair to Bed: with min assist Goal: Pt Will Ambulate Outcome: Progressing Flowsheets (Taken 10/04/2022 1212) Pt will Ambulate:  25 feet  with minimal assist  with rolling walker   12:12 PM, 10/04/22 Ocie Bob, MPT Physical Therapist with [facility] 336 [phone] office [phone] mobile phone

## 2022-10-04 NOTE — [place] FL2 (Signed)
[place] MEDICAID FL2 LEVEL OF CARE FORM     IDENTIFICATION  Patient Name: [name] [name] Birthdate: Mar 10, 1932 Sex: male Admission Date (Current Location): 10/03/2022  [facility] and [place] Number:  [employer] and Address:  [facility],  [address] [place] [address]      Provider Number:    Attending Physician Name and Address:  System, Provider Not In  Relative Name and Phone Number:  Lanyon,Latina (Daughter)  [phone] (Mobile)    Current Level of Care: Hospital Recommended Level of Care: Skilled Nursing Facility Prior Approval Number:    Date Approved/Denied:   PASRR Number: [id number]  Discharge Plan: SNF    Current Diagnoses: Patient Active Problem List   Diagnosis Date Noted   TIA (transient ischemic attack) 09/24/2022   Pulmonary nodule 09/24/2022   Prolonged QT interval 09/24/2022   Acute respiratory failure with hypoxia (HCC) 04/17/2022   Hypomagnesemia 04/17/2022   COVID-19 virus infection 04/17/2022   COPD with acute exacerbation (HCC) 04/16/2022   Acute metabolic encephalopathy 01/29/2022   Heart block AV second degree 01/29/2022   Heart block 01/28/2022   Carotid stenosis 01/28/2022   Closed compression fracture of L1 vertebra (HCC) 01/28/2022   GERD (gastroesophageal reflux disease) 01/28/2022   Bilateral carotid artery stenosis 05/07/2018   Preop cardiovascular exam 05/07/2018   Educated about COVID-19 virus infection 05/07/2018   Coronary artery disease involving native coronary artery of native heart without angina pectoris 05/07/2018   Tobacco abuse 01/03/2011   Pneumonia 01/02/2011   Anemia 01/02/2011   Acute renal failure superimposed on stage 3b chronic kidney disease (HCC) 01/02/2011   Elevated troponin 01/02/2011   Acute respiratory distress 01/02/2011   COPD (chronic obstructive pulmonary disease) (HCC) 01/02/2011   Carotid bruit 05/02/2010   Mixed hyperlipidemia 03/16/2009   OBESITY, UNSPECIFIED  03/16/2009   TOBACCO ABUSE 03/16/2009   Diabetes mellitus type 2 in nonobese (HCC) 03/14/2009   Essential hypertension 03/14/2009   Coronary atherosclerosis 03/14/2009   SLEEP APNEA 03/14/2009    Orientation RESPIRATION BLADDER Height & Weight     Self  Normal Incontinent Weight: 158 lb (71.7 kg) Height:  5\' 3"  (160 cm)  BEHAVIORAL SYMPTOMS/MOOD NEUROLOGICAL BOWEL NUTRITION STATUS      Incontinent Diet  AMBULATORY STATUS COMMUNICATION OF NEEDS Skin   Extensive Assist Verbally Other (Comment) (DRY)                       Personal Care Assistance Level of Assistance  Bathing, Feeding, Dressing Bathing Assistance: Limited assistance Feeding assistance: Limited assistance Dressing Assistance: Limited assistance     Functional Limitations Info  Sight, Hearing Sight Info: Impaired Hearing Info: Impaired      SPECIAL CARE FACTORS FREQUENCY  PT (By licensed PT), OT (By licensed OT)     PT Frequency: 2X week Min OT Frequency: 2 X week Min            Contractures Contractures Info: Not present    Additional Factors Info  Allergies   Allergies Info: NKA           Current Medications (10/04/2022):  This is the current hospital active medication list Current Facility-Administered Medications  Medication Dose Route Frequency Provider Last Rate Last Admin   albuterol (PROVENTIL) (2.5 MG/3ML) 0.083% nebulizer solution 2.5 mg  2.5 mg Nebulization Q4H PRN Hunt, Madison H, RPH       cholecalciferol (VITAMIN D3) 25 MCG (1000 UNIT) tablet 2,000 Units  2,000 Units Oral  Daily Arabella Merles, PA-C   2,000 Units at 10/04/22 1016   clopidogrel (PLAVIX) tablet 75 mg  75 mg Oral Daily Arabella Merles, PA-C   75 mg at 10/04/22 1016   rosuvastatin (CRESTOR) tablet 5 mg  5 mg Oral QHS Arabella Merles, PA-C       Current Outpatient Medications  Medication Sig Dispense Refill   escitalopram (LEXAPRO) 10 MG tablet Take 10 mg by mouth daily.     ferrous sulfate 325 (65 FE) MG  EC tablet Take 325 mg by mouth daily with breakfast.      irbesartan-hydrochlorothiazide (AVALIDE) 300-12.5 MG per tablet Take 1 tablet by mouth daily. 90 tablet 3   loperamide (IMODIUM) 2 MG capsule Take 2 mg by mouth 3 (three) times daily as needed for diarrhea or loose stools.     melatonin 5 MG TABS Take 5 mg by mouth at bedtime.     pantoprazole (PROTONIX) 40 MG tablet Take 1 tablet (40 mg total) by mouth daily.     rosuvastatin (CRESTOR) 5 MG tablet Take 5 mg by mouth at bedtime.  3   VOLTAREN ARTHRITIS PAIN 1 % GEL Apply 2 g topically in the morning, at noon, and at bedtime.     Facility-Administered Medications Ordered in Other Encounters  Medication Dose Route Frequency Provider Last Rate Last Admin   gemcitabine (GEMZAR) chemo syringe for bladder instillation 2,000 mg  2,000 mg Bladder Instillation Once Marcine Matar, MD         Discharge Medications: Please see discharge summary for a list of discharge medications.  Relevant Imaging Results:  Relevant Lab Results:   Additional Information [id number]  Catalina Gravel, LCSW

## 2022-10-04 NOTE — ED Notes (Addendum)
Daughter at [facility] [place], [phone]. D/w pt's daughter at [facility] plan, process, timeframe, obstacles with rationale. Verbalizes understanding, and denies questions. Voices concerns that presentation is not c/w dementia, more consistent with delirium, concerned about changes, but understands testing did not show acute reasons for sx or presentation. Note from PT visit today discussed. Will reach out to SW/TOC for update. Family verbalizes understanding of weekend obstacles, and that they want a say in where he goes/ placement.

## 2022-10-04 NOTE — Evaluation (Signed)
Physical Therapy Evaluation Patient Details Name: [name] [name] MRN: [id number] DOB: Aug 09, 1932 Today's Date: 10/04/2022  History of Present Illness  [name] [name] is a 89 y.o. male with type 2 diabetes, hypertension, COPD, CKD, who was recently seen this morning for a mechanical fall, brought in by EMS for an episode of vomiting after he left the ER today.  When talking to the patient, he states he drank something that made him throw up back at his living facility.  He denies any feelings of nausea or vomiting currently.  Denies any abdominal pain, changes in vision, headache, any other concerns. Patient is on Plavix   Clinical Impression  Patient demonstrates labored movement for sitting up at bedside, very unsteady on feet at high risk for falls and limited to a few ataxic like steps at bedside before having to sit due to fatigue and poor standing balance using RW.  Patient put back to bed after therapy.  Patient will benefit from continued skilled physical therapy in hospital and recommended venue below to increase strength, balance, endurance for safe ADLs and gait.         If plan is discharge home, recommend the following: A lot of help with walking and/or transfers;A lot of help with bathing/dressing/bathroom;Assistance with cooking/housework;Help with stairs or ramp for entrance   Can travel by private vehicle   No    Equipment Recommendations None recommended by PT  Recommendations for Other Services       Functional Status Assessment Patient has had a recent decline in their functional status and demonstrates the ability to make significant improvements in function in a reasonable and predictable amount of time.     Precautions / Restrictions Precautions Precautions: Fall Restrictions Weight Bearing Restrictions: No      Mobility  Bed Mobility Overal bed mobility: Needs Assistance Bed Mobility: Supine to Sit, Sit to Supine     Supine to sit: Min assist Sit to supine:  Min assist   General bed mobility comments: slow labored movement    Transfers Overall transfer level: Needs assistance Equipment used: Rolling walker (2 wheels) Transfers: Sit to/from Stand, Bed to chair/wheelchair/BSC Sit to Stand: Mod assist   Step pivot transfers: Mod assist       General transfer comment: unsteady labored movement with ataxic like movement    Ambulation/Gait Ambulation/Gait assistance: Mod assist Gait Distance (Feet): 10 Feet Assistive device: Rolling walker (2 wheels) Gait Pattern/deviations: Decreased step length - right, Decreased step length - left, Decreased stride length, Knees buckling, Ataxic, Trunk flexed Gait velocity: slow     General Gait Details: limited to a few steps at bedside demonstrating slow labored ataxic like movement of BLE, frequent near loss of balance, buckling of knees  Stairs            Wheelchair Mobility     Tilt Bed    Modified Rankin (Stroke Patients Only)       Balance Overall balance assessment: Needs assistance Sitting-balance support: Feet supported, No upper extremity supported Sitting balance-Leahy Scale: Fair Sitting balance - Comments: seated at EOB   Standing balance support: Reliant on assistive device for balance, During functional activity, Bilateral upper extremity supported Standing balance-Leahy Scale: Poor Standing balance comment: using RW                             Pertinent Vitals/Pain Pain Assessment Pain Assessment: No/denies pain    Home Living Family/patient expects to  be discharged to:: Assisted living                 Home Equipment: Agricultural consultant (2 wheels);Wheelchair - manual      Prior Function Prior Level of Function : Needs assist       Physical Assist : ADLs (physical);Mobility (physical) Mobility (physical): Transfers;Gait ADLs (physical): Bathing;Dressing;IADLs Mobility Comments: RW ambulation with supervision ADLs Comments: PRN asssist  for bathing and dressing. Assist IADL's.     Extremity/Trunk Assessment   Upper Extremity Assessment Upper Extremity Assessment: Generalized weakness    Lower Extremity Assessment Lower Extremity Assessment: Generalized weakness    Cervical / Trunk Assessment Cervical / Trunk Assessment: Kyphotic  Communication   Communication Communication: No apparent difficulties Cueing Techniques: Verbal cues;Tactile cues  Cognition Arousal: Alert Behavior During Therapy: WFL for tasks assessed/performed Overall Cognitive Status: History of cognitive impairments - at baseline                                          General Comments      Exercises     Assessment/Plan    PT Assessment Patient needs continued PT services  PT Problem List Decreased strength;Decreased activity tolerance;Decreased balance;Decreased mobility;Decreased coordination       PT Treatment Interventions DME instruction;Gait training;Stair training;Functional mobility training;Therapeutic activities;Therapeutic exercise;Balance training;Patient/family education    PT Goals (Current goals can be found in the Care Plan section)  Acute Rehab PT Goals Patient Stated Goal: return home PT Goal Formulation: With patient Time For Goal Achievement: 10/18/22 Potential to Achieve Goals: Good    Frequency Min 2X/week     Co-evaluation               AM-PAC PT "6 Clicks" Mobility  Outcome Measure Help needed turning from your back to your side while in a flat bed without using bedrails?: A Little Help needed moving from lying on your back to sitting on the side of a flat bed without using bedrails?: A Little Help needed moving to and from a bed to a chair (including a wheelchair)?: A Lot Help needed standing up from a chair using your arms (e.g., wheelchair or bedside chair)?: A Lot Help needed to walk in hospital room?: A Lot Help needed climbing 3-5 steps with a railing? : A Lot 6 Click  Score: 14    End of Session   Activity Tolerance: Patient tolerated treatment well;Patient limited by fatigue Patient left: in bed;with nursing/sitter in room Nurse Communication: Mobility status PT Visit Diagnosis: Unsteadiness on feet (R26.81);Other abnormalities of gait and mobility (R26.89);Repeated falls (R29.6);Muscle weakness (generalized) (M62.81)    Time: 7829-5621 PT Time Calculation (min) (ACUTE ONLY): 22 min   Charges:   PT Evaluation $PT Eval Moderate Complexity: 1 Mod PT Treatments $Therapeutic Activity: 8-22 mins PT General Charges $$ ACUTE PT VISIT: 1 Visit         12:10 PM, 10/04/22 Ocie Bob, MPT Physical Therapist with [facility] [phone] office [phone] mobile phone

## 2022-10-04 NOTE — ED Notes (Signed)
Pt incontinent of bowel and bladder. Brief changed and barrier cream applied. Full linen change.

## 2022-10-05 DIAGNOSIS — R1110 Vomiting, unspecified: Secondary | ICD-10-CM | POA: Diagnosis not present

## 2022-10-05 DIAGNOSIS — S0031XA Abrasion of nose, initial encounter: Secondary | ICD-10-CM | POA: Diagnosis not present

## 2022-10-05 DIAGNOSIS — R451 Restlessness and agitation: Secondary | ICD-10-CM | POA: Diagnosis not present

## 2022-10-05 MED ORDER — LOPERAMIDE HCL 2 MG PO CAPS
2.0000 mg | ORAL_CAPSULE | Freq: Three times a day (TID) | ORAL | Status: DC | PRN
  Administered 2022-10-05: 2 mg via ORAL
  Filled 2022-10-05: qty 1

## 2022-10-05 MED ORDER — ZIPRASIDONE MESYLATE 20 MG IM SOLR: 10.0000 mg | Freq: Once | INTRAMUSCULAR | Status: AC

## 2022-10-05 MED ORDER — MAGNESIUM OXIDE -MG SUPPLEMENT 400 (240 MG) MG PO TABS
400.0000 mg | ORAL_TABLET | Freq: Every day | ORAL | Status: DC
  Administered 2022-10-05 – 2022-10-07 (×3): 400 mg via ORAL
  Filled 2022-10-05 (×3): qty 1

## 2022-10-05 MED ORDER — ZIPRASIDONE MESYLATE 20 MG IM SOLR
INTRAMUSCULAR | Status: AC
  Administered 2022-10-05: 10 mg via INTRAMUSCULAR
  Filled 2022-10-05: qty 20

## 2022-10-05 MED ORDER — DICLOFENAC SODIUM 1 % EX GEL: 2.0000 g | Freq: Four times a day (QID) | CUTANEOUS | Status: DC | PRN

## 2022-10-05 MED ORDER — HALOPERIDOL LACTATE 5 MG/ML IJ SOLN: 2.0000 mg | Freq: Four times a day (QID) | INTRAMUSCULAR | Status: DC | PRN

## 2022-10-05 MED ORDER — FERROUS SULFATE 325 (65 FE) MG PO TABS
325.0000 mg | ORAL_TABLET | Freq: Every day | ORAL | Status: DC
  Administered 2022-10-06 – 2022-10-07 (×2): 325 mg via ORAL
  Filled 2022-10-05 (×2): qty 1

## 2022-10-05 MED ORDER — PANTOPRAZOLE SODIUM 40 MG PO TBEC
40.0000 mg | DELAYED_RELEASE_TABLET | Freq: Every day | ORAL | Status: DC
  Administered 2022-10-05 – 2022-10-07 (×3): 40 mg via ORAL
  Filled 2022-10-05 (×3): qty 1

## 2022-10-05 MED ORDER — STERILE WATER FOR INJECTION IJ SOLN
INTRAMUSCULAR | Status: AC
  Filled 2022-10-05: qty 10

## 2022-10-05 MED ORDER — NICOTINE 14 MG/24HR TD PT24: 14.0000 mg | MEDICATED_PATCH | Freq: Once | TRANSDERMAL | Status: DC

## 2022-10-05 MED ORDER — STERILE WATER FOR INJECTION IJ SOLN: 2.1000 mL | Freq: Once | INTRAMUSCULAR | Status: DC

## 2022-10-05 MED ORDER — IRBESARTAN 75 MG PO TABS
75.0000 mg | ORAL_TABLET | Freq: Every day | ORAL | Status: DC
  Administered 2022-10-05 – 2022-10-07 (×3): 75 mg via ORAL
  Filled 2022-10-05 (×3): qty 1

## 2022-10-05 MED ORDER — ZIPRASIDONE MESYLATE 20 MG IM SOLR
10.0000 mg | Freq: Once | INTRAMUSCULAR | Status: AC
  Administered 2022-10-05: 2.1 mg via INTRAMUSCULAR
  Filled 2022-10-05: qty 20

## 2022-10-05 MED ORDER — ESCITALOPRAM OXALATE 10 MG PO TABS
10.0000 mg | ORAL_TABLET | Freq: Every day | ORAL | Status: DC
  Administered 2022-10-05 – 2022-10-07 (×3): 10 mg via ORAL
  Filled 2022-10-05 (×3): qty 1

## 2022-10-05 MED ORDER — HALOPERIDOL LACTATE 5 MG/ML IJ SOLN
2.0000 mg | Freq: Four times a day (QID) | INTRAMUSCULAR | Status: DC | PRN
  Administered 2022-10-05 – 2022-10-06 (×2): 2 mg via INTRAMUSCULAR
  Filled 2022-10-05 (×2): qty 1

## 2022-10-05 MED ORDER — ACETAMINOPHEN 325 MG PO TABS
650.0000 mg | ORAL_TABLET | Freq: Four times a day (QID) | ORAL | Status: DC | PRN
  Administered 2022-10-05 – 2022-10-07 (×2): 650 mg via ORAL
  Filled 2022-10-05 (×2): qty 2

## 2022-10-05 MED ORDER — NICOTINE 14 MG/24HR TD PT24
14.0000 mg | MEDICATED_PATCH | Freq: Every day | TRANSDERMAL | Status: DC
  Administered 2022-10-05 – 2022-10-07 (×3): 14 mg via TRANSDERMAL
  Filled 2022-10-05 (×3): qty 1

## 2022-10-05 NOTE — ED Notes (Signed)
Pt was found removing clothes and attempting to get out of bed. When pt was redirected and offered help he got aggressive with sitter. RN was notified.

## 2022-10-05 NOTE — ED Notes (Addendum)
Pt brief clean and dry, pt did not have a BM. Pt resting comfortably at this time

## 2022-10-05 NOTE — ED Provider Notes (Signed)
Emergency Medicine Observation Re-evaluation Note  [name] [name] is a 89 y.o. male, seen on rounds today.  Pt initially presented to the ED for complaints of Emesis Currently, the patient is sleeping.  Physical Exam  BP (!) 180/90 (BP Location: Right Arm)   Pulse 70   Temp 97.6 F (36.4 C) (Oral)   Resp 20   Ht 5\' 3"  (1.6 m)   Wt 71.7 kg   SpO2 97%   BMI 27.99 kg/m  Physical Exam General: Sleeping Cardiac: Extremities well-perfused Lungs: Breathing is unlabored Psych: Deferred  ED Course / MDM  EKG:   I have reviewed the labs performed to date as well as medications administered while in observation.  Recent changes in the last 24 hours include social work working on [employer] placement.  Patient having intermittent agitation and combativeness.  Plan  Current plan is for SNF placement.    Gloris Manchester, MD 10/05/22 [phone]

## 2022-10-05 NOTE — ED Notes (Signed)
Pt still restless Pt keeps saying he needs to get his trailer to return the things that this RN ordered Pt insisting to stop at the gas station so we can take the trailer to drop off that thing  Mitts remain in place Medicated per [facility] with night time medications  Attempting least restrictive measures rather than PRN IM haldol  Pt's brief is dry

## 2022-10-05 NOTE — ED Notes (Addendum)
Pt now combative with ED Staff. Pt kicked Recruitment consultant and attempted to punch NT. EDP Notified. Pt hit his right arm on bedside rail while attempting to hit ED Staff. Pt sustained two skin tears to forearm. Bleeding controlled at this time. Bandages placed over skin tears.

## 2022-10-05 NOTE — ED Notes (Signed)
Pt increasingly getting agitated   Pt stated he was going to drive to the store to get cigarettes.  Pt asked this RN to crank his truck for him

## 2022-10-05 NOTE — ED Notes (Signed)
Introduced self to pt and daughter Pt alert and talking in full sentences [facility] and stated he had just walked down to have breakfast with Peggy. Daughter has mother with dementia and is trying to get father placed with the same home care set up that mother has. Daughter has full medication list [facility] Will complete med rec so pt came stay on current meds.  Waiting for lunch, mitts removed.  Pt is calm and pleasant at this time

## 2022-10-05 NOTE — ED Notes (Signed)
Patients Daughter Latina requested to speak to this nurse. Updated her on Fathers status, that Pt became combative with staff and the reason for padding to bed and mitts was for pt safety. Pt also receive a medication to calm him down. Patient is currently sleeping. Daughter acknowledged reason, stated she may leave but would like to be called back to assist with Pt when he wakes up.

## 2022-10-05 NOTE — ED Notes (Signed)
Pt yelling "come on" Went to room and asked what was wrong Pt told this RN that she needed to catch the [place] Pt stated that [place] was running around this RN's feet

## 2022-10-05 NOTE — ED Notes (Signed)
Pt confused and difficult to reorient. Pt removing articles of clothing, and blankets and threw cup of water on the floor. Pt demonstrating increasing agitation at this time. Pt has remarked that he will "knock the hell out of you" towards ED Staff. Pt attempting to roll and flip himself over the bedside railing on the bed.

## 2022-10-05 NOTE — ED Notes (Signed)
Pt trying to get mitts off and is shouting

## 2022-10-05 NOTE — ED Notes (Signed)
Pt laying on stretcher  Pt continues to left legs and try to get off stretcher Mitt in place

## 2022-10-05 NOTE — ED Notes (Addendum)
Pt has safety mittens on per RN. Pt has been taking off clothes and attempting to take off his brief. Pt is getting aggressive to staff and not wanting to cooperate and let staff help him.

## 2022-10-05 NOTE — ED Notes (Signed)
Pt on bedpan  Pt stated he needed to have BM Pt continues to talk and reach for his wife that is not in the room. Pt hallucinating.  Daughter stated he has been talking to other family members today who are not actually present.  While this RN was at bedside, pt lifted arms and appeared to be doing something with his hands. Pt stated he was driving in his truck. The started talking about the transmission.  Pt remains on bedpan to have BM

## 2022-10-05 NOTE — ED Notes (Signed)
Pt found to have legs over the side rail over seizure pads. Pt stated he had to pee Assisted with urinal 350cc out

## 2022-10-05 NOTE — ED Notes (Signed)
Pt laying on stretcher rambling to self Pt keeps repeating that he has to go get his truck and get to [facility] outside room

## 2022-10-05 NOTE — ED Notes (Signed)
Pt agitated and swinging hands at staff Pt trying to "get out of this truck"  Skin tears on RIGHT elbow worse as pt trying to struggle to get off stretcher.  Pt wet, sheets wet, pt not allow staff to change pt  Pt continues to say that he needs to get out of his truck, that his truck is stuck up against this pole while fighting with the arm reel. Covered with sezure pad. Blood on seizure pads from skin tears.  Informed PA Medicated per [facility] PRN orders also in place for pt

## 2022-10-05 NOTE — ED Notes (Signed)
Mitts placed on pt Pt trying to climb out of stretcher.

## 2022-10-05 NOTE — ED Notes (Signed)
Pt now calm and cooperative Daughter remains at bedside Changed pt's brief and sheets

## 2022-10-05 NOTE — ED Notes (Signed)
Pt alert still trying to "get out of this truck" Noted that pt had BM along with being wet from prior urination.   Will attempt to clean pt once pt is calm and cooperative

## 2022-10-05 NOTE — ED Notes (Signed)
Pt cooperative had 2 bites of fruit cup Pt still hallucinating

## 2022-10-05 NOTE — ED Notes (Signed)
Pt's brief had been changed and repositioned in the bed. Resting watching tv at this time.

## 2022-10-05 NOTE — ED Notes (Signed)
Breathing treatment started now that pt is completed lunch Daughter requested nicotine patch for pt Daughter stated that he goes outside about 5 times a day and each trip he has 2 cigarettes.   1/2 PPD smoker

## 2022-10-06 ENCOUNTER — Emergency Department (HOSPITAL_COMMUNITY): Payer: Medicare Other

## 2022-10-06 DIAGNOSIS — R634 Abnormal weight loss: Secondary | ICD-10-CM | POA: Diagnosis not present

## 2022-10-06 DIAGNOSIS — J449 Chronic obstructive pulmonary disease, unspecified: Secondary | ICD-10-CM | POA: Diagnosis not present

## 2022-10-06 DIAGNOSIS — I6782 Cerebral ischemia: Secondary | ICD-10-CM | POA: Diagnosis not present

## 2022-10-06 DIAGNOSIS — R1110 Vomiting, unspecified: Secondary | ICD-10-CM | POA: Diagnosis not present

## 2022-10-06 DIAGNOSIS — S0031XA Abrasion of nose, initial encounter: Secondary | ICD-10-CM | POA: Diagnosis not present

## 2022-10-06 DIAGNOSIS — R4182 Altered mental status, unspecified: Secondary | ICD-10-CM | POA: Diagnosis not present

## 2022-10-06 DIAGNOSIS — Z043 Encounter for examination and observation following other accident: Secondary | ICD-10-CM | POA: Diagnosis not present

## 2022-10-06 DIAGNOSIS — I119 Hypertensive heart disease without heart failure: Secondary | ICD-10-CM | POA: Diagnosis not present

## 2022-10-06 DIAGNOSIS — K5730 Diverticulosis of large intestine without perforation or abscess without bleeding: Secondary | ICD-10-CM | POA: Diagnosis not present

## 2022-10-06 DIAGNOSIS — R9089 Other abnormal findings on diagnostic imaging of central nervous system: Secondary | ICD-10-CM | POA: Diagnosis not present

## 2022-10-06 DIAGNOSIS — R451 Restlessness and agitation: Secondary | ICD-10-CM | POA: Diagnosis not present

## 2022-10-06 DIAGNOSIS — R918 Other nonspecific abnormal finding of lung field: Secondary | ICD-10-CM | POA: Diagnosis not present

## 2022-10-06 LAB — CBC WITH DIFFERENTIAL/PLATELET
Abs Immature Granulocytes: 0.03 10*3/uL (ref 0.00–0.07)
Basophils Absolute: 0 10*3/uL (ref 0.0–0.1)
Basophils Relative: 1 %
Eosinophils Absolute: 0 10*3/uL (ref 0.0–0.5)
Eosinophils Relative: 0 %
HCT: 38.6 % — ABNORMAL LOW (ref 39.0–52.0)
Hemoglobin: 12.7 g/dL — ABNORMAL LOW (ref 13.0–17.0)
Immature Granulocytes: 0 %
Lymphocytes Relative: 23 %
Lymphs Abs: 2.1 10*3/uL (ref 0.7–4.0)
MCH: 29.7 pg (ref 26.0–34.0)
MCHC: 32.9 g/dL (ref 30.0–36.0)
MCV: 90.2 fL (ref 80.0–100.0)
Monocytes Absolute: 0.7 10*3/uL (ref 0.1–1.0)
Monocytes Relative: 8 %
Neutro Abs: 6 10*3/uL (ref 1.7–7.7)
Neutrophils Relative %: 68 %
Platelets: 220 10*3/uL (ref 150–400)
RBC: 4.28 MIL/uL (ref 4.22–5.81)
RDW: 13.6 % (ref 11.5–15.5)
WBC: 8.8 10*3/uL (ref 4.0–10.5)
nRBC: 0 % (ref 0.0–0.2)

## 2022-10-06 LAB — COMPREHENSIVE METABOLIC PANEL
ALT: 14 U/L (ref 0–44)
AST: 20 U/L (ref 15–41)
Albumin: 3.5 g/dL (ref 3.5–5.0)
Alkaline Phosphatase: 56 U/L (ref 38–126)
Anion gap: 10 (ref 5–15)
BUN: 36 mg/dL — ABNORMAL HIGH (ref 8–23)
CO2: 25 mmol/L (ref 22–32)
Calcium: 9.1 mg/dL (ref 8.9–10.3)
Chloride: 101 mmol/L (ref 98–111)
Creatinine, Ser: 1.74 mg/dL — ABNORMAL HIGH (ref 0.61–1.24)
GFR, Estimated: 37 mL/min — ABNORMAL LOW (ref >60)
Glucose, Bld: 123 mg/dL — ABNORMAL HIGH (ref 70–99)
Potassium: 4 mmol/L (ref 3.5–5.1)
Sodium: 136 mmol/L (ref 135–145)
Total Bilirubin: 0.7 mg/dL (ref 0.3–1.2)
Total Protein: 6.3 g/dL — ABNORMAL LOW (ref 6.5–8.1)

## 2022-10-06 LAB — MAGNESIUM: Magnesium: 1.9 mg/dL (ref 1.7–2.4)

## 2022-10-06 LAB — AMMONIA: Ammonia: 13 umol/L (ref 9–35)

## 2022-10-06 MED ORDER — ZIPRASIDONE MESYLATE 20 MG IM SOLR
10.0000 mg | Freq: Once | INTRAMUSCULAR | Status: AC
  Administered 2022-10-06: 10 mg via INTRAMUSCULAR
  Filled 2022-10-06: qty 20

## 2022-10-06 NOTE — ED Notes (Signed)
Daughter comes out of room requesting to speak with provider. Provider made aware. Daughter has threaten to post on social media and explained she can't take pics or videos. Daughter states "I will not accept another discharge until I know what is wrong with him, he is not baseline."

## 2022-10-06 NOTE — ED Provider Notes (Addendum)
Emergency Medicine Observation Re-evaluation Note  [name] [name] is a 89 y.o. male, seen on rounds today.  Pt initially presented to the ED for complaints of Emesis Currently, the patient is sleeping.  Physical Exam  BP (!) 206/85 (BP Location: Left Arm)   Pulse 67   Temp (!) 97.5 F (36.4 C) (Oral)   Resp 18   Ht 5\' 3"  (1.6 m)   Wt 71.7 kg   SpO2 97%   BMI 27.99 kg/m  Physical Exam General: Sleeping Cardiac: Extremities well-perfused Lungs: Breathing is unlabored Psych: Deferred  ED Course / MDM  EKG:   I have reviewed the labs performed to date as well as medications administered while in observation.  Recent changes in the last 24 hours include intermittent agitation.  Plan  Current plan is for SNF placement.  Patient's daughter arrived in the ED.  She was able to provide further history.  Per daughter, up until last Thursday, he was alert, oriented, ambulating, going out for smoke breaks, playing cards.  He has been altered and agitated since then.  She feels that there is some acute reason for his change in mental status.  Patient, currently, denies any areas of physical discomfort.  Repeat lab work and imaging studies were ordered.  CT of head did not show acute findings.  Remaining imaging and lab work were pending at time of signout.  Care of patient was signed out to oncoming ED provider.     Gloris Manchester, MD 10/06/22 [phone]

## 2022-10-06 NOTE — ED Notes (Signed)
PRN haldol admin for MRI due to patient trying to hit staff.

## 2022-10-06 NOTE — ED Notes (Signed)
Daughter states "I have seen the doctor walk by several times, why is he not coming in here."  Explained to daughter that patients in ER are seen by highest acuity, by who is requiring life savin measurements at this time. Also explained that provider is aware."

## 2022-10-06 NOTE — ED Notes (Signed)
Patient pulled up in bed and rotated to left side. Incontinent care provided.

## 2022-10-06 NOTE — ED Notes (Signed)
Pt received lunch tray 

## 2022-10-06 NOTE — TOC Progression Note (Addendum)
Transition of Care [facility]) - Progression Note    Patient Details  Name: [name] [name] MRN: [id number] Date of Birth: September 22, 1932  [facility]) CM/SW Contact  Leitha Bleak, RN Phone Number: 10/06/2022, 10:05 AM   Addendum :  Per [facility], [facility], the paitent can not come back, needs admission and SNF. CM spoke with is daughter to discuss bed offers. She is very upset, stating he is not at baseline and wants to see MD. Team updated.  [facility] offering and can accept, he is medicare and THN active. MD/RN updated. Not discuss with daughter, due to her wanting patient admitted. See RN notes.  Clinical Narrative:   Patient in ED, [facility]. CM called his daughter to discuss the SNF bed offers given. She stated that [facility] is now saying he can come back with a sitter. CM called and left Chasity a message to confirm that they will come assess and accept him back. TOC following.       Barriers to Discharge: No SNF bed  Expected Discharge Plan and Services       Social Determinants of Health (SDOH) Interventions SDOH Screenings   Food Insecurity: No Food Insecurity (09/24/2022)  Housing: Low Risk  (09/24/2022)  Transportation Needs: No Transportation Needs (09/24/2022)  Utilities: Not At Risk (09/24/2022)  Tobacco Use: High Risk (10/03/2022)    Readmission Risk Interventions     No data to display

## 2022-10-06 NOTE — ED Notes (Signed)
Pt attempting to crawl out of bed, pulling at cords, yelling "stop it son", this RN attempted to talk with pt, pt continues to yell "make him stop"

## 2022-10-06 NOTE — ED Notes (Signed)
Daughter is requesting to speak with Consulting civil engineer. Charge is aware

## 2022-10-06 NOTE — ED Notes (Signed)
Pt received breakfast tray 

## 2022-10-06 NOTE — ED Provider Notes (Signed)
Patient has labs repeated by Dr. Durwin Nora.  White count is 8.8.  Hemoglobin 12.7.  Patient complete metabolic panel electrolytes still normal.  Creatinine actually 1.74 BUN 36 giving him a GFR 37.  Will compare to see what that is too old.  Patient's GFR was actually 38 on the 27th so no significant change there.  Patient had repeat CT head nothing acute or anything traumatic stable noncontrast head CT from previous patient also had CT chest abdomen and pelvis done by Dr. Durwin Nora increased size of irregular nodule in the medial right upper lobe now measuring 15 mL previous 11 this is concerning for malignancy pulmonary referral PET scan CT scan recommended.  Questionable bronchial thickening central lobar micro nodularity in the right greater than left lower lobes which can be seen in the setting of small airway disease.  Increased fracture of L1 with 50% vertebral body height loss compared to January 2024.  6 to 5 mm retropulsion of the superior endplate.  So no significant changes.  Will go ahead and proceed with MRI.  Patient has been waiting for placement.  But does have fairly significant altered mental status.  See what MRI shows.   Vanetta Mulders, MD 10/06/22 [id number]

## 2022-10-07 DIAGNOSIS — E441 Mild protein-calorie malnutrition: Secondary | ICD-10-CM | POA: Diagnosis not present

## 2022-10-07 DIAGNOSIS — F32A Depression, unspecified: Secondary | ICD-10-CM | POA: Diagnosis not present

## 2022-10-07 DIAGNOSIS — E1159 Type 2 diabetes mellitus with other circulatory complications: Secondary | ICD-10-CM | POA: Diagnosis not present

## 2022-10-07 DIAGNOSIS — I119 Hypertensive heart disease without heart failure: Secondary | ICD-10-CM | POA: Diagnosis not present

## 2022-10-07 DIAGNOSIS — I4892 Unspecified atrial flutter: Secondary | ICD-10-CM | POA: Diagnosis not present

## 2022-10-07 DIAGNOSIS — R262 Difficulty in walking, not elsewhere classified: Secondary | ICD-10-CM | POA: Diagnosis not present

## 2022-10-07 DIAGNOSIS — N189 Chronic kidney disease, unspecified: Secondary | ICD-10-CM | POA: Diagnosis not present

## 2022-10-07 DIAGNOSIS — I6782 Cerebral ischemia: Secondary | ICD-10-CM | POA: Diagnosis not present

## 2022-10-07 DIAGNOSIS — E1122 Type 2 diabetes mellitus with diabetic chronic kidney disease: Secondary | ICD-10-CM | POA: Diagnosis not present

## 2022-10-07 DIAGNOSIS — R059 Cough, unspecified: Secondary | ICD-10-CM | POA: Diagnosis not present

## 2022-10-07 DIAGNOSIS — M6281 Muscle weakness (generalized): Secondary | ICD-10-CM | POA: Diagnosis not present

## 2022-10-07 DIAGNOSIS — R531 Weakness: Secondary | ICD-10-CM | POA: Diagnosis not present

## 2022-10-07 DIAGNOSIS — I6521 Occlusion and stenosis of right carotid artery: Secondary | ICD-10-CM | POA: Diagnosis not present

## 2022-10-07 DIAGNOSIS — S0031XA Abrasion of nose, initial encounter: Secondary | ICD-10-CM | POA: Diagnosis not present

## 2022-10-07 DIAGNOSIS — R5381 Other malaise: Secondary | ICD-10-CM | POA: Diagnosis not present

## 2022-10-07 DIAGNOSIS — G4700 Insomnia, unspecified: Secondary | ICD-10-CM | POA: Diagnosis not present

## 2022-10-07 DIAGNOSIS — M5030 Other cervical disc degeneration, unspecified cervical region: Secondary | ICD-10-CM | POA: Diagnosis not present

## 2022-10-07 DIAGNOSIS — K5730 Diverticulosis of large intestine without perforation or abscess without bleeding: Secondary | ICD-10-CM | POA: Diagnosis not present

## 2022-10-07 DIAGNOSIS — W19XXXD Unspecified fall, subsequent encounter: Secondary | ICD-10-CM | POA: Diagnosis not present

## 2022-10-07 DIAGNOSIS — K529 Noninfective gastroenteritis and colitis, unspecified: Secondary | ICD-10-CM | POA: Diagnosis not present

## 2022-10-07 DIAGNOSIS — B351 Tinea unguium: Secondary | ICD-10-CM | POA: Diagnosis not present

## 2022-10-07 DIAGNOSIS — I10 Essential (primary) hypertension: Secondary | ICD-10-CM | POA: Diagnosis not present

## 2022-10-07 DIAGNOSIS — Z79899 Other long term (current) drug therapy: Secondary | ICD-10-CM | POA: Diagnosis not present

## 2022-10-07 DIAGNOSIS — R41841 Cognitive communication deficit: Secondary | ICD-10-CM | POA: Diagnosis not present

## 2022-10-07 DIAGNOSIS — C3490 Malignant neoplasm of unspecified part of unspecified bronchus or lung: Secondary | ICD-10-CM | POA: Diagnosis not present

## 2022-10-07 DIAGNOSIS — E875 Hyperkalemia: Secondary | ICD-10-CM | POA: Diagnosis not present

## 2022-10-07 DIAGNOSIS — F331 Major depressive disorder, recurrent, moderate: Secondary | ICD-10-CM | POA: Diagnosis not present

## 2022-10-07 DIAGNOSIS — I129 Hypertensive chronic kidney disease with stage 1 through stage 4 chronic kidney disease, or unspecified chronic kidney disease: Secondary | ICD-10-CM | POA: Diagnosis not present

## 2022-10-07 DIAGNOSIS — M4802 Spinal stenosis, cervical region: Secondary | ICD-10-CM | POA: Diagnosis not present

## 2022-10-07 DIAGNOSIS — E782 Mixed hyperlipidemia: Secondary | ICD-10-CM | POA: Diagnosis not present

## 2022-10-07 DIAGNOSIS — R197 Diarrhea, unspecified: Secondary | ICD-10-CM | POA: Diagnosis not present

## 2022-10-07 DIAGNOSIS — R451 Restlessness and agitation: Secondary | ICD-10-CM | POA: Diagnosis not present

## 2022-10-07 DIAGNOSIS — Z743 Need for continuous supervision: Secondary | ICD-10-CM | POA: Diagnosis not present

## 2022-10-07 DIAGNOSIS — D649 Anemia, unspecified: Secondary | ICD-10-CM | POA: Diagnosis not present

## 2022-10-07 DIAGNOSIS — D508 Other iron deficiency anemias: Secondary | ICD-10-CM | POA: Diagnosis not present

## 2022-10-07 DIAGNOSIS — R911 Solitary pulmonary nodule: Secondary | ICD-10-CM | POA: Diagnosis not present

## 2022-10-07 DIAGNOSIS — Z9181 History of falling: Secondary | ICD-10-CM | POA: Diagnosis not present

## 2022-10-07 DIAGNOSIS — I2510 Atherosclerotic heart disease of native coronary artery without angina pectoris: Secondary | ICD-10-CM | POA: Diagnosis not present

## 2022-10-07 DIAGNOSIS — R1310 Dysphagia, unspecified: Secondary | ICD-10-CM | POA: Diagnosis not present

## 2022-10-07 DIAGNOSIS — I441 Atrioventricular block, second degree: Secondary | ICD-10-CM | POA: Diagnosis not present

## 2022-10-07 DIAGNOSIS — R1110 Vomiting, unspecified: Secondary | ICD-10-CM | POA: Diagnosis not present

## 2022-10-07 DIAGNOSIS — J449 Chronic obstructive pulmonary disease, unspecified: Secondary | ICD-10-CM | POA: Diagnosis not present

## 2022-10-07 DIAGNOSIS — F5101 Primary insomnia: Secondary | ICD-10-CM | POA: Diagnosis not present

## 2022-10-07 DIAGNOSIS — J441 Chronic obstructive pulmonary disease with (acute) exacerbation: Secondary | ICD-10-CM | POA: Diagnosis not present

## 2022-10-07 DIAGNOSIS — K219 Gastro-esophageal reflux disease without esophagitis: Secondary | ICD-10-CM | POA: Diagnosis not present

## 2022-10-07 DIAGNOSIS — J40 Bronchitis, not specified as acute or chronic: Secondary | ICD-10-CM | POA: Diagnosis not present

## 2022-10-07 DIAGNOSIS — R9082 White matter disease, unspecified: Secondary | ICD-10-CM | POA: Diagnosis not present

## 2022-10-07 DIAGNOSIS — E119 Type 2 diabetes mellitus without complications: Secondary | ICD-10-CM | POA: Diagnosis not present

## 2022-10-07 DIAGNOSIS — N179 Acute kidney failure, unspecified: Secondary | ICD-10-CM | POA: Diagnosis not present

## 2022-10-07 NOTE — ED Notes (Signed)
Repositioned pt to the RIGHT side Sitter assisting pt with lunch

## 2022-10-07 NOTE — ED Notes (Signed)
Report to [facility] [phone] Pt going to room 32

## 2022-10-07 NOTE — TOC Transition Note (Signed)
Transition of Care [facility]) - CM/SW Discharge Note   Patient Details  Name: [name] [name] MRN: [id number] Date of Birth: 1932/01/11  Transition of Care [facility]) CM/SW Contact:  Leitha Bleak, RN Phone Number: 10/07/2022, 1:36 PM   Clinical Narrative:  [facility] in [facility] for [facility].  [facility] approved to use the Waiver for admission today at [facility]. RN calling report and EMS. AVS faxed to Admission.    Final next level of care: Skilled Nursing Facility Barriers to Discharge: Barriers Resolved   Patient Goals and CMS Choice CMS Medicare.gov Compare Post Acute Care list provided to:: Patient Represenative (must comment) Choice offered to / list presented to : [facility] POA / Guardian, Adult Children  Discharge Placement                 Patient to be transferred to facility by: EMS Name of family member notified: Donnell Patient and family notified of of transfer: 10/07/22  Discharge Plan and Services Additional resources added to the After Visit Summary for          Social Determinants of Health (SDOH) Interventions SDOH Screenings   Food Insecurity: No Food Insecurity (09/24/2022)  Housing: Low Risk  (09/24/2022)  Transportation Needs: No Transportation Needs (09/24/2022)  Utilities: Not At Risk (09/24/2022)  Tobacco Use: High Risk (10/03/2022)

## 2022-10-07 NOTE — ED Notes (Signed)
Patient ate breakfast, which consisted of grits, 1/2 banana and apple juice. Offered to put gown on patient and he declined it. Patient is comfortable and resting, was cooperative when I was in there this am.

## 2022-10-07 NOTE — ED Notes (Signed)
Pt resting comfortably Assisted with beverage

## 2022-10-07 NOTE — ED Provider Notes (Signed)
Patient will be discharged to a care facility.   Bethann Berkshire, MD 10/07/22 1254

## 2022-10-07 NOTE — ED Notes (Signed)
Changed brief, peri care performed, scrotum noted to bed red in appearance, moisture barrier cream applied. Family member, daughter, at bedside would like to speak with the MD about yesterdays CT results--MD made aware

## 2022-10-07 NOTE — ED Notes (Signed)
Called [phone] regarding pt's transport Informed that crew left for the day Another [employer] truck will be getting on sometime after 6pm

## 2022-10-07 NOTE — ED Notes (Signed)
Pt ate entire dinner and dessert.  Pt is alert and cheerful Pt reminded that he is in [facility] ED and asked why he was here.

## 2022-10-07 NOTE — TOC Progression Note (Signed)
Transition of Care [facility]) - Progression Note    Patient Details  Name: [name] [name] MRN: [id number] Date of Birth: Mar 05, 1932  Transition of Care [facility]) CM/SW Contact  Leitha Bleak, RN Phone Number: 10/07/2022, 11:34 AM  Clinical Narrative:   Daughter called TOC, wanting an update on labs and his care. TIffany, manager went to bedside to update. [facility] is offer, TOC waiting for MD to medically clear patient.      Barriers to Discharge: No SNF bed  Expected Discharge Plan and Services

## 2022-10-07 NOTE — ED Notes (Signed)
Bed bath given All sheets changed Pt in clean brief and gown Pt sitting up watching TV  Called daughter Mickle Mallory [phone] with update Waiting on [employer] to pick pt up

## 2022-10-07 NOTE — ED Notes (Addendum)
Introduced self  Daughter at bedside Daughter anxious and upset that no recent updates have been given Informed daughter of MRI results Daughter stated that pt's buttocks is sore from laying in bed and informed that pt was recently changed.   Pt open eyes when talked to Rolled pt to RIGHT side and padded with pillows.  Admin at bedside

## 2022-10-07 NOTE — ED Notes (Signed)
Pt remains resting comfortably at this time.

## 2022-10-08 DIAGNOSIS — K219 Gastro-esophageal reflux disease without esophagitis: Secondary | ICD-10-CM | POA: Diagnosis not present

## 2022-10-08 DIAGNOSIS — R059 Cough, unspecified: Secondary | ICD-10-CM | POA: Diagnosis not present

## 2022-10-08 DIAGNOSIS — F32A Depression, unspecified: Secondary | ICD-10-CM | POA: Diagnosis not present

## 2022-10-08 DIAGNOSIS — R5381 Other malaise: Secondary | ICD-10-CM | POA: Diagnosis not present

## 2022-10-08 DIAGNOSIS — I4892 Unspecified atrial flutter: Secondary | ICD-10-CM | POA: Diagnosis not present

## 2022-10-08 DIAGNOSIS — I10 Essential (primary) hypertension: Secondary | ICD-10-CM | POA: Diagnosis not present

## 2022-10-08 DIAGNOSIS — R911 Solitary pulmonary nodule: Secondary | ICD-10-CM | POA: Diagnosis not present

## 2022-10-08 DIAGNOSIS — R1310 Dysphagia, unspecified: Secondary | ICD-10-CM | POA: Diagnosis not present

## 2022-10-08 DIAGNOSIS — D508 Other iron deficiency anemias: Secondary | ICD-10-CM | POA: Diagnosis not present

## 2022-10-08 DIAGNOSIS — N189 Chronic kidney disease, unspecified: Secondary | ICD-10-CM | POA: Diagnosis not present

## 2022-10-08 DIAGNOSIS — I2510 Atherosclerotic heart disease of native coronary artery without angina pectoris: Secondary | ICD-10-CM | POA: Diagnosis not present

## 2022-10-09 DIAGNOSIS — R1310 Dysphagia, unspecified: Secondary | ICD-10-CM | POA: Diagnosis not present

## 2022-10-09 DIAGNOSIS — I10 Essential (primary) hypertension: Secondary | ICD-10-CM | POA: Diagnosis not present

## 2022-10-09 DIAGNOSIS — I4892 Unspecified atrial flutter: Secondary | ICD-10-CM | POA: Diagnosis not present

## 2022-10-09 DIAGNOSIS — R5381 Other malaise: Secondary | ICD-10-CM | POA: Diagnosis not present

## 2022-10-09 DIAGNOSIS — F32A Depression, unspecified: Secondary | ICD-10-CM | POA: Diagnosis not present

## 2022-10-09 DIAGNOSIS — N189 Chronic kidney disease, unspecified: Secondary | ICD-10-CM | POA: Diagnosis not present

## 2022-10-09 DIAGNOSIS — R059 Cough, unspecified: Secondary | ICD-10-CM | POA: Diagnosis not present

## 2022-10-09 DIAGNOSIS — R911 Solitary pulmonary nodule: Secondary | ICD-10-CM | POA: Diagnosis not present

## 2022-10-10 DIAGNOSIS — J449 Chronic obstructive pulmonary disease, unspecified: Secondary | ICD-10-CM | POA: Diagnosis not present

## 2022-10-10 DIAGNOSIS — I119 Hypertensive heart disease without heart failure: Secondary | ICD-10-CM | POA: Diagnosis not present

## 2022-10-15 DIAGNOSIS — R1310 Dysphagia, unspecified: Secondary | ICD-10-CM | POA: Diagnosis not present

## 2022-10-15 DIAGNOSIS — N189 Chronic kidney disease, unspecified: Secondary | ICD-10-CM | POA: Diagnosis not present

## 2022-10-15 DIAGNOSIS — I10 Essential (primary) hypertension: Secondary | ICD-10-CM | POA: Diagnosis not present

## 2022-10-15 DIAGNOSIS — N179 Acute kidney failure, unspecified: Secondary | ICD-10-CM | POA: Diagnosis not present

## 2022-10-15 DIAGNOSIS — R5381 Other malaise: Secondary | ICD-10-CM | POA: Diagnosis not present

## 2022-10-15 DIAGNOSIS — F32A Depression, unspecified: Secondary | ICD-10-CM | POA: Diagnosis not present

## 2022-10-15 DIAGNOSIS — I6521 Occlusion and stenosis of right carotid artery: Secondary | ICD-10-CM | POA: Diagnosis not present

## 2022-10-15 DIAGNOSIS — I2510 Atherosclerotic heart disease of native coronary artery without angina pectoris: Secondary | ICD-10-CM | POA: Diagnosis not present

## 2022-10-15 DIAGNOSIS — K219 Gastro-esophageal reflux disease without esophagitis: Secondary | ICD-10-CM | POA: Diagnosis not present

## 2022-10-15 DIAGNOSIS — I4892 Unspecified atrial flutter: Secondary | ICD-10-CM | POA: Diagnosis not present

## 2022-10-15 DIAGNOSIS — D508 Other iron deficiency anemias: Secondary | ICD-10-CM | POA: Diagnosis not present

## 2022-10-22 DIAGNOSIS — I2510 Atherosclerotic heart disease of native coronary artery without angina pectoris: Secondary | ICD-10-CM | POA: Diagnosis not present

## 2022-10-22 DIAGNOSIS — N189 Chronic kidney disease, unspecified: Secondary | ICD-10-CM | POA: Diagnosis not present

## 2022-10-22 DIAGNOSIS — R5381 Other malaise: Secondary | ICD-10-CM | POA: Diagnosis not present

## 2022-10-22 DIAGNOSIS — D508 Other iron deficiency anemias: Secondary | ICD-10-CM | POA: Diagnosis not present

## 2022-10-22 DIAGNOSIS — I10 Essential (primary) hypertension: Secondary | ICD-10-CM | POA: Diagnosis not present

## 2022-10-22 DIAGNOSIS — I4892 Unspecified atrial flutter: Secondary | ICD-10-CM | POA: Diagnosis not present

## 2022-10-22 DIAGNOSIS — K219 Gastro-esophageal reflux disease without esophagitis: Secondary | ICD-10-CM | POA: Diagnosis not present

## 2022-10-22 DIAGNOSIS — F32A Depression, unspecified: Secondary | ICD-10-CM | POA: Diagnosis not present

## 2022-10-22 DIAGNOSIS — R1310 Dysphagia, unspecified: Secondary | ICD-10-CM | POA: Diagnosis not present

## 2022-10-29 DIAGNOSIS — R5381 Other malaise: Secondary | ICD-10-CM | POA: Diagnosis not present

## 2022-10-29 DIAGNOSIS — G4700 Insomnia, unspecified: Secondary | ICD-10-CM | POA: Diagnosis not present

## 2022-10-29 DIAGNOSIS — I10 Essential (primary) hypertension: Secondary | ICD-10-CM | POA: Diagnosis not present

## 2022-10-29 DIAGNOSIS — I6521 Occlusion and stenosis of right carotid artery: Secondary | ICD-10-CM | POA: Diagnosis not present

## 2022-10-29 DIAGNOSIS — D508 Other iron deficiency anemias: Secondary | ICD-10-CM | POA: Diagnosis not present

## 2022-10-29 DIAGNOSIS — N189 Chronic kidney disease, unspecified: Secondary | ICD-10-CM | POA: Diagnosis not present

## 2022-10-29 DIAGNOSIS — R1310 Dysphagia, unspecified: Secondary | ICD-10-CM | POA: Diagnosis not present

## 2022-10-29 DIAGNOSIS — R911 Solitary pulmonary nodule: Secondary | ICD-10-CM | POA: Diagnosis not present

## 2022-10-29 DIAGNOSIS — F32A Depression, unspecified: Secondary | ICD-10-CM | POA: Diagnosis not present

## 2022-10-29 DIAGNOSIS — I2510 Atherosclerotic heart disease of native coronary artery without angina pectoris: Secondary | ICD-10-CM | POA: Diagnosis not present

## 2022-10-29 DIAGNOSIS — K219 Gastro-esophageal reflux disease without esophagitis: Secondary | ICD-10-CM | POA: Diagnosis not present

## 2022-10-29 DIAGNOSIS — I4892 Unspecified atrial flutter: Secondary | ICD-10-CM | POA: Diagnosis not present

## 2022-11-03 ENCOUNTER — Other Ambulatory Visit: Payer: Self-pay | Admitting: *Deleted

## 2022-11-03 NOTE — Patient Outreach (Signed)
Mr. Gantert resides in [facility]) SNF. [facility] SNF waiver was utilized for admission. Screening for potential care coordination/ chronic care management services as a benefit of health plan and primary care provider.  Telephone call made to [employer], [place]. No answer. Voicemail message left requesting return call. Secure message sent to [place], SW to inquire about transition plans/date.  Will continue to follow.   Raiford Noble, MSN, RN, BSN [place]  [facility], Healthy Communities RN Post- Acute Care Coordinator Direct Dial: [phone]

## 2022-11-06 DIAGNOSIS — I10 Essential (primary) hypertension: Secondary | ICD-10-CM | POA: Diagnosis not present

## 2022-11-06 DIAGNOSIS — F32A Depression, unspecified: Secondary | ICD-10-CM | POA: Diagnosis not present

## 2022-11-06 DIAGNOSIS — G4700 Insomnia, unspecified: Secondary | ICD-10-CM | POA: Diagnosis not present

## 2022-11-06 DIAGNOSIS — I2510 Atherosclerotic heart disease of native coronary artery without angina pectoris: Secondary | ICD-10-CM | POA: Diagnosis not present

## 2022-11-06 DIAGNOSIS — R911 Solitary pulmonary nodule: Secondary | ICD-10-CM | POA: Diagnosis not present

## 2022-11-06 DIAGNOSIS — R1310 Dysphagia, unspecified: Secondary | ICD-10-CM | POA: Diagnosis not present

## 2022-11-06 DIAGNOSIS — D508 Other iron deficiency anemias: Secondary | ICD-10-CM | POA: Diagnosis not present

## 2022-11-06 DIAGNOSIS — K219 Gastro-esophageal reflux disease without esophagitis: Secondary | ICD-10-CM | POA: Diagnosis not present

## 2022-11-06 DIAGNOSIS — N189 Chronic kidney disease, unspecified: Secondary | ICD-10-CM | POA: Diagnosis not present

## 2022-11-06 DIAGNOSIS — I4892 Unspecified atrial flutter: Secondary | ICD-10-CM | POA: Diagnosis not present

## 2022-11-06 DIAGNOSIS — I6521 Occlusion and stenosis of right carotid artery: Secondary | ICD-10-CM | POA: Diagnosis not present

## 2022-11-06 DIAGNOSIS — R5381 Other malaise: Secondary | ICD-10-CM | POA: Diagnosis not present

## 2022-11-07 ENCOUNTER — Other Ambulatory Visit: Payer: Self-pay | Admitting: *Deleted

## 2022-11-07 NOTE — Patient Outreach (Signed)
Post- Acute Care Coordinator follow up. Mr. Yale resides in [facility]) SNF.   Update received from [place], [place] social worker. Mr. Mierzejewski is progressing well with therapy. Family meeting is scheduled for today. Anticipated transition plans are to return to [facility] vs remain at [place].  Will continue to follow for transition plans/date.   Raiford Noble, MSN, RN, BSN [place]  [facility] [place] [facility], Healthy Communities RN Post- Acute Care Coordinator Direct Dial: [phone]

## 2022-11-14 DIAGNOSIS — I4892 Unspecified atrial flutter: Secondary | ICD-10-CM | POA: Diagnosis not present

## 2022-11-14 DIAGNOSIS — R911 Solitary pulmonary nodule: Secondary | ICD-10-CM | POA: Diagnosis not present

## 2022-11-14 DIAGNOSIS — Z79899 Other long term (current) drug therapy: Secondary | ICD-10-CM | POA: Diagnosis not present

## 2022-11-14 DIAGNOSIS — I2510 Atherosclerotic heart disease of native coronary artery without angina pectoris: Secondary | ICD-10-CM | POA: Diagnosis not present

## 2022-11-14 DIAGNOSIS — N189 Chronic kidney disease, unspecified: Secondary | ICD-10-CM | POA: Diagnosis not present

## 2022-11-14 DIAGNOSIS — G4700 Insomnia, unspecified: Secondary | ICD-10-CM | POA: Diagnosis not present

## 2022-11-14 DIAGNOSIS — R1310 Dysphagia, unspecified: Secondary | ICD-10-CM | POA: Diagnosis not present

## 2022-11-14 DIAGNOSIS — K219 Gastro-esophageal reflux disease without esophagitis: Secondary | ICD-10-CM | POA: Diagnosis not present

## 2022-11-14 DIAGNOSIS — I10 Essential (primary) hypertension: Secondary | ICD-10-CM | POA: Diagnosis not present

## 2022-11-14 DIAGNOSIS — F32A Depression, unspecified: Secondary | ICD-10-CM | POA: Diagnosis not present

## 2022-11-14 DIAGNOSIS — I6521 Occlusion and stenosis of right carotid artery: Secondary | ICD-10-CM | POA: Diagnosis not present

## 2022-11-17 DIAGNOSIS — F331 Major depressive disorder, recurrent, moderate: Secondary | ICD-10-CM | POA: Diagnosis not present

## 2022-11-17 DIAGNOSIS — F5101 Primary insomnia: Secondary | ICD-10-CM | POA: Diagnosis not present

## 2022-11-19 DIAGNOSIS — K219 Gastro-esophageal reflux disease without esophagitis: Secondary | ICD-10-CM | POA: Diagnosis not present

## 2022-11-19 DIAGNOSIS — I2510 Atherosclerotic heart disease of native coronary artery without angina pectoris: Secondary | ICD-10-CM | POA: Diagnosis not present

## 2022-11-19 DIAGNOSIS — R1310 Dysphagia, unspecified: Secondary | ICD-10-CM | POA: Diagnosis not present

## 2022-11-19 DIAGNOSIS — I10 Essential (primary) hypertension: Secondary | ICD-10-CM | POA: Diagnosis not present

## 2022-11-19 DIAGNOSIS — I4892 Unspecified atrial flutter: Secondary | ICD-10-CM | POA: Diagnosis not present

## 2022-11-19 DIAGNOSIS — N189 Chronic kidney disease, unspecified: Secondary | ICD-10-CM | POA: Diagnosis not present

## 2022-11-19 DIAGNOSIS — R059 Cough, unspecified: Secondary | ICD-10-CM | POA: Diagnosis not present

## 2022-11-19 DIAGNOSIS — G4700 Insomnia, unspecified: Secondary | ICD-10-CM | POA: Diagnosis not present

## 2022-11-19 DIAGNOSIS — F32A Depression, unspecified: Secondary | ICD-10-CM | POA: Diagnosis not present

## 2022-11-19 DIAGNOSIS — K529 Noninfective gastroenteritis and colitis, unspecified: Secondary | ICD-10-CM | POA: Diagnosis not present

## 2022-11-19 DIAGNOSIS — D508 Other iron deficiency anemias: Secondary | ICD-10-CM | POA: Diagnosis not present

## 2022-11-20 ENCOUNTER — Other Ambulatory Visit: Payer: Self-pay | Admitting: *Deleted

## 2022-11-20 NOTE — Patient Outreach (Addendum)
Post- Acute Care Coordinator follow up. Mr. Rosenman resides in [facility]. Mr. Tow utilized [facility] SNF waiver for admission to [facility]   Update received from [place], [place] social worker. Mr. Weitzner wants to return to ILF. However family wants him to remain in ALF. Continues to work with therapy. Gait is unsteady and therapy continues to work on endurance.   Will continue to follow.   Raiford Noble, MSN, RN, BSN [place]  [facility] RN Post- Acute Care Manager Direct Dial: [phone]

## 2022-11-26 DIAGNOSIS — B351 Tinea unguium: Secondary | ICD-10-CM | POA: Diagnosis not present

## 2022-11-26 DIAGNOSIS — E1159 Type 2 diabetes mellitus with other circulatory complications: Secondary | ICD-10-CM | POA: Diagnosis not present

## 2022-12-02 DIAGNOSIS — D508 Other iron deficiency anemias: Secondary | ICD-10-CM | POA: Diagnosis not present

## 2022-12-02 DIAGNOSIS — I10 Essential (primary) hypertension: Secondary | ICD-10-CM | POA: Diagnosis not present

## 2022-12-02 DIAGNOSIS — I4892 Unspecified atrial flutter: Secondary | ICD-10-CM | POA: Diagnosis not present

## 2022-12-02 DIAGNOSIS — R5381 Other malaise: Secondary | ICD-10-CM | POA: Diagnosis not present

## 2022-12-02 DIAGNOSIS — F32A Depression, unspecified: Secondary | ICD-10-CM | POA: Diagnosis not present

## 2022-12-02 DIAGNOSIS — R911 Solitary pulmonary nodule: Secondary | ICD-10-CM | POA: Diagnosis not present

## 2022-12-02 DIAGNOSIS — G4700 Insomnia, unspecified: Secondary | ICD-10-CM | POA: Diagnosis not present

## 2022-12-02 DIAGNOSIS — K219 Gastro-esophageal reflux disease without esophagitis: Secondary | ICD-10-CM | POA: Diagnosis not present

## 2022-12-02 DIAGNOSIS — R1310 Dysphagia, unspecified: Secondary | ICD-10-CM | POA: Diagnosis not present

## 2022-12-02 DIAGNOSIS — I2510 Atherosclerotic heart disease of native coronary artery without angina pectoris: Secondary | ICD-10-CM | POA: Diagnosis not present

## 2022-12-02 DIAGNOSIS — K529 Noninfective gastroenteritis and colitis, unspecified: Secondary | ICD-10-CM | POA: Diagnosis not present

## 2022-12-02 DIAGNOSIS — N189 Chronic kidney disease, unspecified: Secondary | ICD-10-CM | POA: Diagnosis not present

## 2022-12-08 DIAGNOSIS — I4892 Unspecified atrial flutter: Secondary | ICD-10-CM | POA: Diagnosis not present

## 2022-12-08 DIAGNOSIS — R5381 Other malaise: Secondary | ICD-10-CM | POA: Diagnosis not present

## 2022-12-08 DIAGNOSIS — I10 Essential (primary) hypertension: Secondary | ICD-10-CM | POA: Diagnosis not present

## 2022-12-08 DIAGNOSIS — K529 Noninfective gastroenteritis and colitis, unspecified: Secondary | ICD-10-CM | POA: Diagnosis not present

## 2022-12-08 DIAGNOSIS — I6521 Occlusion and stenosis of right carotid artery: Secondary | ICD-10-CM | POA: Diagnosis not present

## 2022-12-08 DIAGNOSIS — G4700 Insomnia, unspecified: Secondary | ICD-10-CM | POA: Diagnosis not present

## 2022-12-08 DIAGNOSIS — N189 Chronic kidney disease, unspecified: Secondary | ICD-10-CM | POA: Diagnosis not present

## 2022-12-08 DIAGNOSIS — K219 Gastro-esophageal reflux disease without esophagitis: Secondary | ICD-10-CM | POA: Diagnosis not present

## 2022-12-08 DIAGNOSIS — I2510 Atherosclerotic heart disease of native coronary artery without angina pectoris: Secondary | ICD-10-CM | POA: Diagnosis not present

## 2022-12-08 DIAGNOSIS — R911 Solitary pulmonary nodule: Secondary | ICD-10-CM | POA: Diagnosis not present

## 2022-12-08 DIAGNOSIS — R1310 Dysphagia, unspecified: Secondary | ICD-10-CM | POA: Diagnosis not present

## 2022-12-10 DIAGNOSIS — R197 Diarrhea, unspecified: Secondary | ICD-10-CM | POA: Diagnosis not present

## 2022-12-11 DIAGNOSIS — D649 Anemia, unspecified: Secondary | ICD-10-CM | POA: Diagnosis not present

## 2022-12-12 ENCOUNTER — Other Ambulatory Visit: Payer: Self-pay | Admitting: *Deleted

## 2022-12-12 DIAGNOSIS — E119 Type 2 diabetes mellitus without complications: Secondary | ICD-10-CM | POA: Diagnosis not present

## 2022-12-12 DIAGNOSIS — I2510 Atherosclerotic heart disease of native coronary artery without angina pectoris: Secondary | ICD-10-CM | POA: Diagnosis not present

## 2022-12-12 DIAGNOSIS — I10 Essential (primary) hypertension: Secondary | ICD-10-CM | POA: Diagnosis not present

## 2022-12-12 DIAGNOSIS — N189 Chronic kidney disease, unspecified: Secondary | ICD-10-CM | POA: Diagnosis not present

## 2022-12-12 NOTE — Patient Outreach (Signed)
Post-Acute Care Manager follow up. Mr. Hendriks resides in [facility] per [employer]. Following for [facility] SNF waiver utilization and for potential complex care management needs.   Secure communication sent to [place], SNF social worker to inquire about transition plans/date.   Will continue to follow.   Raiford Noble, MSN, RN, BSN [place]  [facility] RN Post- Acute Care Manager Direct Dial: [phone]

## 2022-12-13 DIAGNOSIS — R41841 Cognitive communication deficit: Secondary | ICD-10-CM | POA: Diagnosis not present

## 2022-12-13 DIAGNOSIS — K219 Gastro-esophageal reflux disease without esophagitis: Secondary | ICD-10-CM | POA: Diagnosis not present

## 2022-12-13 DIAGNOSIS — R1310 Dysphagia, unspecified: Secondary | ICD-10-CM | POA: Diagnosis not present

## 2022-12-13 DIAGNOSIS — R911 Solitary pulmonary nodule: Secondary | ICD-10-CM | POA: Diagnosis not present

## 2022-12-13 DIAGNOSIS — R262 Difficulty in walking, not elsewhere classified: Secondary | ICD-10-CM | POA: Diagnosis not present

## 2022-12-13 DIAGNOSIS — M6281 Muscle weakness (generalized): Secondary | ICD-10-CM | POA: Diagnosis not present

## 2022-12-13 DIAGNOSIS — I6782 Cerebral ischemia: Secondary | ICD-10-CM | POA: Diagnosis not present

## 2022-12-13 DIAGNOSIS — I10 Essential (primary) hypertension: Secondary | ICD-10-CM | POA: Diagnosis not present

## 2022-12-15 ENCOUNTER — Other Ambulatory Visit: Payer: Self-pay | Admitting: *Deleted

## 2022-12-15 DIAGNOSIS — B372 Candidiasis of skin and nail: Secondary | ICD-10-CM | POA: Diagnosis not present

## 2022-12-15 DIAGNOSIS — N189 Chronic kidney disease, unspecified: Secondary | ICD-10-CM | POA: Diagnosis not present

## 2022-12-15 DIAGNOSIS — I10 Essential (primary) hypertension: Secondary | ICD-10-CM | POA: Diagnosis not present

## 2022-12-15 DIAGNOSIS — K529 Noninfective gastroenteritis and colitis, unspecified: Secondary | ICD-10-CM | POA: Diagnosis not present

## 2022-12-15 DIAGNOSIS — K219 Gastro-esophageal reflux disease without esophagitis: Secondary | ICD-10-CM | POA: Diagnosis not present

## 2022-12-15 DIAGNOSIS — M6281 Muscle weakness (generalized): Secondary | ICD-10-CM | POA: Diagnosis not present

## 2022-12-15 DIAGNOSIS — R262 Difficulty in walking, not elsewhere classified: Secondary | ICD-10-CM | POA: Diagnosis not present

## 2022-12-15 DIAGNOSIS — L308 Other specified dermatitis: Secondary | ICD-10-CM | POA: Diagnosis not present

## 2022-12-15 DIAGNOSIS — I6782 Cerebral ischemia: Secondary | ICD-10-CM | POA: Diagnosis not present

## 2022-12-15 DIAGNOSIS — R41841 Cognitive communication deficit: Secondary | ICD-10-CM | POA: Diagnosis not present

## 2022-12-15 DIAGNOSIS — D508 Other iron deficiency anemias: Secondary | ICD-10-CM | POA: Diagnosis not present

## 2022-12-15 DIAGNOSIS — I4892 Unspecified atrial flutter: Secondary | ICD-10-CM | POA: Diagnosis not present

## 2022-12-15 DIAGNOSIS — I2510 Atherosclerotic heart disease of native coronary artery without angina pectoris: Secondary | ICD-10-CM | POA: Diagnosis not present

## 2022-12-15 DIAGNOSIS — G4700 Insomnia, unspecified: Secondary | ICD-10-CM | POA: Diagnosis not present

## 2022-12-15 DIAGNOSIS — R1310 Dysphagia, unspecified: Secondary | ICD-10-CM | POA: Diagnosis not present

## 2022-12-15 DIAGNOSIS — R911 Solitary pulmonary nodule: Secondary | ICD-10-CM | POA: Diagnosis not present

## 2022-12-15 NOTE — Patient Outreach (Signed)
Post-Acute Care Manager follow up. Mr. Matkowski resides in [place]. Mr. Steimer utilized [facility] SNF waiver for admission to [facility].   Confirmed with Lajuana Carry social worker. Mr. Groebner transitioned to [facility] on 12/10/22.  No identifiable complex care management needs.   Raiford Noble, MSN, RN, BSN [place]  [facility] RN Post- Acute Care Manager Direct Dial: [phone]

## 2022-12-16 DIAGNOSIS — R262 Difficulty in walking, not elsewhere classified: Secondary | ICD-10-CM | POA: Diagnosis not present

## 2022-12-16 DIAGNOSIS — R41841 Cognitive communication deficit: Secondary | ICD-10-CM | POA: Diagnosis not present

## 2022-12-16 DIAGNOSIS — M6281 Muscle weakness (generalized): Secondary | ICD-10-CM | POA: Diagnosis not present

## 2022-12-16 DIAGNOSIS — R1310 Dysphagia, unspecified: Secondary | ICD-10-CM | POA: Diagnosis not present

## 2022-12-16 DIAGNOSIS — I6782 Cerebral ischemia: Secondary | ICD-10-CM | POA: Diagnosis not present

## 2022-12-17 DIAGNOSIS — R262 Difficulty in walking, not elsewhere classified: Secondary | ICD-10-CM | POA: Diagnosis not present

## 2022-12-17 DIAGNOSIS — R41841 Cognitive communication deficit: Secondary | ICD-10-CM | POA: Diagnosis not present

## 2022-12-17 DIAGNOSIS — I6782 Cerebral ischemia: Secondary | ICD-10-CM | POA: Diagnosis not present

## 2022-12-17 DIAGNOSIS — R1310 Dysphagia, unspecified: Secondary | ICD-10-CM | POA: Diagnosis not present

## 2022-12-17 DIAGNOSIS — M6281 Muscle weakness (generalized): Secondary | ICD-10-CM | POA: Diagnosis not present

## 2022-12-18 DIAGNOSIS — R1310 Dysphagia, unspecified: Secondary | ICD-10-CM | POA: Diagnosis not present

## 2022-12-18 DIAGNOSIS — M6281 Muscle weakness (generalized): Secondary | ICD-10-CM | POA: Diagnosis not present

## 2022-12-18 DIAGNOSIS — I6782 Cerebral ischemia: Secondary | ICD-10-CM | POA: Diagnosis not present

## 2022-12-18 DIAGNOSIS — R41841 Cognitive communication deficit: Secondary | ICD-10-CM | POA: Diagnosis not present

## 2022-12-18 DIAGNOSIS — R262 Difficulty in walking, not elsewhere classified: Secondary | ICD-10-CM | POA: Diagnosis not present

## 2022-12-19 DIAGNOSIS — N390 Urinary tract infection, site not specified: Secondary | ICD-10-CM | POA: Diagnosis not present

## 2022-12-19 DIAGNOSIS — M6281 Muscle weakness (generalized): Secondary | ICD-10-CM | POA: Diagnosis not present

## 2022-12-19 DIAGNOSIS — I6782 Cerebral ischemia: Secondary | ICD-10-CM | POA: Diagnosis not present

## 2022-12-19 DIAGNOSIS — R262 Difficulty in walking, not elsewhere classified: Secondary | ICD-10-CM | POA: Diagnosis not present

## 2022-12-19 DIAGNOSIS — R41841 Cognitive communication deficit: Secondary | ICD-10-CM | POA: Diagnosis not present

## 2022-12-19 DIAGNOSIS — R1310 Dysphagia, unspecified: Secondary | ICD-10-CM | POA: Diagnosis not present

## 2022-12-22 DIAGNOSIS — F331 Major depressive disorder, recurrent, moderate: Secondary | ICD-10-CM | POA: Diagnosis not present

## 2022-12-22 DIAGNOSIS — R41841 Cognitive communication deficit: Secondary | ICD-10-CM | POA: Diagnosis not present

## 2022-12-22 DIAGNOSIS — I6782 Cerebral ischemia: Secondary | ICD-10-CM | POA: Diagnosis not present

## 2022-12-22 DIAGNOSIS — R262 Difficulty in walking, not elsewhere classified: Secondary | ICD-10-CM | POA: Diagnosis not present

## 2022-12-22 DIAGNOSIS — F5101 Primary insomnia: Secondary | ICD-10-CM | POA: Diagnosis not present

## 2022-12-22 DIAGNOSIS — M6281 Muscle weakness (generalized): Secondary | ICD-10-CM | POA: Diagnosis not present

## 2022-12-22 DIAGNOSIS — R1310 Dysphagia, unspecified: Secondary | ICD-10-CM | POA: Diagnosis not present

## 2022-12-23 DIAGNOSIS — R1310 Dysphagia, unspecified: Secondary | ICD-10-CM | POA: Diagnosis not present

## 2022-12-23 DIAGNOSIS — M6281 Muscle weakness (generalized): Secondary | ICD-10-CM | POA: Diagnosis not present

## 2022-12-23 DIAGNOSIS — R262 Difficulty in walking, not elsewhere classified: Secondary | ICD-10-CM | POA: Diagnosis not present

## 2022-12-23 DIAGNOSIS — R41841 Cognitive communication deficit: Secondary | ICD-10-CM | POA: Diagnosis not present

## 2022-12-23 DIAGNOSIS — I6782 Cerebral ischemia: Secondary | ICD-10-CM | POA: Diagnosis not present

## 2022-12-24 DIAGNOSIS — R1310 Dysphagia, unspecified: Secondary | ICD-10-CM | POA: Diagnosis not present

## 2022-12-24 DIAGNOSIS — R262 Difficulty in walking, not elsewhere classified: Secondary | ICD-10-CM | POA: Diagnosis not present

## 2022-12-24 DIAGNOSIS — R41841 Cognitive communication deficit: Secondary | ICD-10-CM | POA: Diagnosis not present

## 2022-12-24 DIAGNOSIS — I6782 Cerebral ischemia: Secondary | ICD-10-CM | POA: Diagnosis not present

## 2022-12-24 DIAGNOSIS — M6281 Muscle weakness (generalized): Secondary | ICD-10-CM | POA: Diagnosis not present

## 2022-12-25 DIAGNOSIS — I6782 Cerebral ischemia: Secondary | ICD-10-CM | POA: Diagnosis not present

## 2022-12-25 DIAGNOSIS — M6281 Muscle weakness (generalized): Secondary | ICD-10-CM | POA: Diagnosis not present

## 2022-12-25 DIAGNOSIS — R262 Difficulty in walking, not elsewhere classified: Secondary | ICD-10-CM | POA: Diagnosis not present

## 2022-12-25 DIAGNOSIS — R1310 Dysphagia, unspecified: Secondary | ICD-10-CM | POA: Diagnosis not present

## 2022-12-25 DIAGNOSIS — R41841 Cognitive communication deficit: Secondary | ICD-10-CM | POA: Diagnosis not present

## 2022-12-26 DIAGNOSIS — R1310 Dysphagia, unspecified: Secondary | ICD-10-CM | POA: Diagnosis not present

## 2022-12-26 DIAGNOSIS — M6281 Muscle weakness (generalized): Secondary | ICD-10-CM | POA: Diagnosis not present

## 2022-12-26 DIAGNOSIS — R262 Difficulty in walking, not elsewhere classified: Secondary | ICD-10-CM | POA: Diagnosis not present

## 2022-12-26 DIAGNOSIS — R41841 Cognitive communication deficit: Secondary | ICD-10-CM | POA: Diagnosis not present

## 2022-12-26 DIAGNOSIS — I6782 Cerebral ischemia: Secondary | ICD-10-CM | POA: Diagnosis not present

## 2022-12-28 DIAGNOSIS — M6281 Muscle weakness (generalized): Secondary | ICD-10-CM | POA: Diagnosis not present

## 2022-12-28 DIAGNOSIS — R262 Difficulty in walking, not elsewhere classified: Secondary | ICD-10-CM | POA: Diagnosis not present

## 2022-12-28 DIAGNOSIS — R1310 Dysphagia, unspecified: Secondary | ICD-10-CM | POA: Diagnosis not present

## 2022-12-28 DIAGNOSIS — R41841 Cognitive communication deficit: Secondary | ICD-10-CM | POA: Diagnosis not present

## 2022-12-28 DIAGNOSIS — I6782 Cerebral ischemia: Secondary | ICD-10-CM | POA: Diagnosis not present

## 2022-12-29 DIAGNOSIS — M6281 Muscle weakness (generalized): Secondary | ICD-10-CM | POA: Diagnosis not present

## 2022-12-29 DIAGNOSIS — R1310 Dysphagia, unspecified: Secondary | ICD-10-CM | POA: Diagnosis not present

## 2022-12-29 DIAGNOSIS — R41841 Cognitive communication deficit: Secondary | ICD-10-CM | POA: Diagnosis not present

## 2022-12-29 DIAGNOSIS — R262 Difficulty in walking, not elsewhere classified: Secondary | ICD-10-CM | POA: Diagnosis not present

## 2022-12-29 DIAGNOSIS — I6782 Cerebral ischemia: Secondary | ICD-10-CM | POA: Diagnosis not present

## 2023-01-01 DIAGNOSIS — I10 Essential (primary) hypertension: Secondary | ICD-10-CM | POA: Diagnosis not present

## 2023-01-01 DIAGNOSIS — R41841 Cognitive communication deficit: Secondary | ICD-10-CM | POA: Diagnosis not present

## 2023-01-01 DIAGNOSIS — M6281 Muscle weakness (generalized): Secondary | ICD-10-CM | POA: Diagnosis not present

## 2023-01-01 DIAGNOSIS — R1310 Dysphagia, unspecified: Secondary | ICD-10-CM | POA: Diagnosis not present

## 2023-01-01 DIAGNOSIS — R262 Difficulty in walking, not elsewhere classified: Secondary | ICD-10-CM | POA: Diagnosis not present

## 2023-01-01 DIAGNOSIS — I6782 Cerebral ischemia: Secondary | ICD-10-CM | POA: Diagnosis not present

## 2023-01-02 DIAGNOSIS — R1310 Dysphagia, unspecified: Secondary | ICD-10-CM | POA: Diagnosis not present

## 2023-01-02 DIAGNOSIS — M6281 Muscle weakness (generalized): Secondary | ICD-10-CM | POA: Diagnosis not present

## 2023-01-02 DIAGNOSIS — I6782 Cerebral ischemia: Secondary | ICD-10-CM | POA: Diagnosis not present

## 2023-01-02 DIAGNOSIS — R262 Difficulty in walking, not elsewhere classified: Secondary | ICD-10-CM | POA: Diagnosis not present

## 2023-01-02 DIAGNOSIS — R41841 Cognitive communication deficit: Secondary | ICD-10-CM | POA: Diagnosis not present

## 2023-01-03 DIAGNOSIS — M6281 Muscle weakness (generalized): Secondary | ICD-10-CM | POA: Diagnosis not present

## 2023-01-03 DIAGNOSIS — R262 Difficulty in walking, not elsewhere classified: Secondary | ICD-10-CM | POA: Diagnosis not present

## 2023-01-03 DIAGNOSIS — R41841 Cognitive communication deficit: Secondary | ICD-10-CM | POA: Diagnosis not present

## 2023-01-03 DIAGNOSIS — I6782 Cerebral ischemia: Secondary | ICD-10-CM | POA: Diagnosis not present

## 2023-01-03 DIAGNOSIS — R1310 Dysphagia, unspecified: Secondary | ICD-10-CM | POA: Diagnosis not present

## 2023-01-05 DIAGNOSIS — I10 Essential (primary) hypertension: Secondary | ICD-10-CM | POA: Diagnosis not present

## 2023-01-05 DIAGNOSIS — R262 Difficulty in walking, not elsewhere classified: Secondary | ICD-10-CM | POA: Diagnosis not present

## 2023-01-05 DIAGNOSIS — I6782 Cerebral ischemia: Secondary | ICD-10-CM | POA: Diagnosis not present

## 2023-01-05 DIAGNOSIS — K529 Noninfective gastroenteritis and colitis, unspecified: Secondary | ICD-10-CM | POA: Diagnosis not present

## 2023-01-05 DIAGNOSIS — R41841 Cognitive communication deficit: Secondary | ICD-10-CM | POA: Diagnosis not present

## 2023-01-05 DIAGNOSIS — H6120 Impacted cerumen, unspecified ear: Secondary | ICD-10-CM | POA: Diagnosis not present

## 2023-01-05 DIAGNOSIS — M6281 Muscle weakness (generalized): Secondary | ICD-10-CM | POA: Diagnosis not present

## 2023-01-05 DIAGNOSIS — I2510 Atherosclerotic heart disease of native coronary artery without angina pectoris: Secondary | ICD-10-CM | POA: Diagnosis not present

## 2023-01-05 DIAGNOSIS — R1310 Dysphagia, unspecified: Secondary | ICD-10-CM | POA: Diagnosis not present

## 2023-01-06 DIAGNOSIS — R1310 Dysphagia, unspecified: Secondary | ICD-10-CM | POA: Diagnosis not present

## 2023-01-06 DIAGNOSIS — R262 Difficulty in walking, not elsewhere classified: Secondary | ICD-10-CM | POA: Diagnosis not present

## 2023-01-06 DIAGNOSIS — R41841 Cognitive communication deficit: Secondary | ICD-10-CM | POA: Diagnosis not present

## 2023-01-06 DIAGNOSIS — I6782 Cerebral ischemia: Secondary | ICD-10-CM | POA: Diagnosis not present

## 2023-01-06 DIAGNOSIS — M6281 Muscle weakness (generalized): Secondary | ICD-10-CM | POA: Diagnosis not present

## 2023-03-07 ENCOUNTER — Emergency Department (HOSPITAL_COMMUNITY)

## 2023-03-07 ENCOUNTER — Other Ambulatory Visit: Payer: Self-pay

## 2023-03-07 ENCOUNTER — Inpatient Hospital Stay (HOSPITAL_COMMUNITY)
Admission: EM | Admit: 2023-03-07 | Discharge: 2023-03-10 | DRG: 481 | Disposition: A | Discharge status: Skilled Nursing Facility | Source: Skilled Nursing Facility | Attending: Internal Medicine | Admitting: Internal Medicine

## 2023-03-07 ENCOUNTER — Encounter (HOSPITAL_COMMUNITY): Payer: Self-pay

## 2023-03-07 DIAGNOSIS — Z85828 Personal history of other malignant neoplasm of skin: Secondary | ICD-10-CM | POA: Diagnosis not present

## 2023-03-07 DIAGNOSIS — Z8546 Personal history of malignant neoplasm of prostate: Secondary | ICD-10-CM

## 2023-03-07 DIAGNOSIS — F411 Generalized anxiety disorder: Secondary | ICD-10-CM | POA: Diagnosis present

## 2023-03-07 DIAGNOSIS — Z7902 Long term (current) use of antithrombotics/antiplatelets: Secondary | ICD-10-CM

## 2023-03-07 DIAGNOSIS — I441 Atrioventricular block, second degree: Secondary | ICD-10-CM | POA: Diagnosis present

## 2023-03-07 DIAGNOSIS — I6523 Occlusion and stenosis of bilateral carotid arteries: Secondary | ICD-10-CM | POA: Diagnosis present

## 2023-03-07 DIAGNOSIS — Z9981 Dependence on supplemental oxygen: Secondary | ICD-10-CM | POA: Diagnosis not present

## 2023-03-07 DIAGNOSIS — F17210 Nicotine dependence, cigarettes, uncomplicated: Secondary | ICD-10-CM | POA: Diagnosis present

## 2023-03-07 DIAGNOSIS — E785 Hyperlipidemia, unspecified: Secondary | ICD-10-CM | POA: Diagnosis present

## 2023-03-07 DIAGNOSIS — G459 Transient cerebral ischemic attack, unspecified: Secondary | ICD-10-CM | POA: Diagnosis not present

## 2023-03-07 DIAGNOSIS — Z79899 Other long term (current) drug therapy: Secondary | ICD-10-CM

## 2023-03-07 DIAGNOSIS — E1122 Type 2 diabetes mellitus with diabetic chronic kidney disease: Secondary | ICD-10-CM | POA: Diagnosis present

## 2023-03-07 DIAGNOSIS — J410 Simple chronic bronchitis: Secondary | ICD-10-CM | POA: Diagnosis not present

## 2023-03-07 DIAGNOSIS — J449 Chronic obstructive pulmonary disease, unspecified: Secondary | ICD-10-CM | POA: Diagnosis present

## 2023-03-07 DIAGNOSIS — I4581 Long QT syndrome: Secondary | ICD-10-CM | POA: Diagnosis not present

## 2023-03-07 DIAGNOSIS — N1832 Chronic kidney disease, stage 3b: Secondary | ICD-10-CM | POA: Insufficient documentation

## 2023-03-07 DIAGNOSIS — R911 Solitary pulmonary nodule: Secondary | ICD-10-CM | POA: Diagnosis present

## 2023-03-07 DIAGNOSIS — Z823 Family history of stroke: Secondary | ICD-10-CM

## 2023-03-07 DIAGNOSIS — Z923 Personal history of irradiation: Secondary | ICD-10-CM

## 2023-03-07 DIAGNOSIS — E7849 Other hyperlipidemia: Secondary | ICD-10-CM

## 2023-03-07 DIAGNOSIS — E119 Type 2 diabetes mellitus without complications: Secondary | ICD-10-CM

## 2023-03-07 DIAGNOSIS — S72001A Fracture of unspecified part of neck of right femur, initial encounter for closed fracture: Secondary | ICD-10-CM | POA: Diagnosis not present

## 2023-03-07 DIAGNOSIS — I252 Old myocardial infarction: Secondary | ICD-10-CM

## 2023-03-07 DIAGNOSIS — I2510 Atherosclerotic heart disease of native coronary artery without angina pectoris: Secondary | ICD-10-CM | POA: Diagnosis present

## 2023-03-07 DIAGNOSIS — I10 Essential (primary) hypertension: Secondary | ICD-10-CM | POA: Diagnosis present

## 2023-03-07 DIAGNOSIS — J9611 Chronic respiratory failure with hypoxia: Secondary | ICD-10-CM | POA: Diagnosis present

## 2023-03-07 DIAGNOSIS — W010XXA Fall on same level from slipping, tripping and stumbling without subsequent striking against object, initial encounter: Secondary | ICD-10-CM | POA: Diagnosis present

## 2023-03-07 DIAGNOSIS — I4510 Unspecified right bundle-branch block: Secondary | ICD-10-CM | POA: Diagnosis present

## 2023-03-07 DIAGNOSIS — K219 Gastro-esophageal reflux disease without esophagitis: Secondary | ICD-10-CM | POA: Diagnosis present

## 2023-03-07 DIAGNOSIS — D72829 Elevated white blood cell count, unspecified: Secondary | ICD-10-CM | POA: Diagnosis present

## 2023-03-07 DIAGNOSIS — D631 Anemia in chronic kidney disease: Secondary | ICD-10-CM | POA: Diagnosis present

## 2023-03-07 DIAGNOSIS — S72141A Displaced intertrochanteric fracture of right femur, initial encounter for closed fracture: Principal | ICD-10-CM

## 2023-03-07 DIAGNOSIS — Z955 Presence of coronary angioplasty implant and graft: Secondary | ICD-10-CM

## 2023-03-07 DIAGNOSIS — Z8673 Personal history of transient ischemic attack (TIA), and cerebral infarction without residual deficits: Secondary | ICD-10-CM | POA: Diagnosis not present

## 2023-03-07 DIAGNOSIS — W19XXXA Unspecified fall, initial encounter: Principal | ICD-10-CM

## 2023-03-07 DIAGNOSIS — S41111A Laceration without foreign body of right upper arm, initial encounter: Secondary | ICD-10-CM | POA: Diagnosis present

## 2023-03-07 DIAGNOSIS — D649 Anemia, unspecified: Secondary | ICD-10-CM | POA: Diagnosis present

## 2023-03-07 DIAGNOSIS — Z7984 Long term (current) use of oral hypoglycemic drugs: Secondary | ICD-10-CM | POA: Diagnosis not present

## 2023-03-07 DIAGNOSIS — Z860100 Personal history of colon polyps, unspecified: Secondary | ICD-10-CM

## 2023-03-07 DIAGNOSIS — Z8679 Personal history of other diseases of the circulatory system: Secondary | ICD-10-CM

## 2023-03-07 DIAGNOSIS — I129 Hypertensive chronic kidney disease with stage 1 through stage 4 chronic kidney disease, or unspecified chronic kidney disease: Secondary | ICD-10-CM | POA: Diagnosis present

## 2023-03-07 DIAGNOSIS — S72141P Displaced intertrochanteric fracture of right femur, subsequent encounter for closed fracture with malunion: Secondary | ICD-10-CM | POA: Diagnosis not present

## 2023-03-07 DIAGNOSIS — Z7951 Long term (current) use of inhaled steroids: Secondary | ICD-10-CM

## 2023-03-07 DIAGNOSIS — Z809 Family history of malignant neoplasm, unspecified: Secondary | ICD-10-CM

## 2023-03-07 DIAGNOSIS — Z8249 Family history of ischemic heart disease and other diseases of the circulatory system: Secondary | ICD-10-CM | POA: Diagnosis not present

## 2023-03-07 DIAGNOSIS — G4730 Sleep apnea, unspecified: Secondary | ICD-10-CM | POA: Diagnosis present

## 2023-03-07 LAB — COMPREHENSIVE METABOLIC PANEL
ALT: 7 U/L (ref 0–44)
AST: 16 U/L (ref 15–41)
Albumin: 3 g/dL — ABNORMAL LOW (ref 3.5–5.0)
Alkaline Phosphatase: 50 U/L (ref 38–126)
Anion gap: 11 (ref 5–15)
BUN: 21 mg/dL (ref 8–23)
CO2: 24 mmol/L (ref 22–32)
Calcium: 9 mg/dL (ref 8.9–10.3)
Chloride: 105 mmol/L (ref 98–111)
Creatinine, Ser: 1.58 mg/dL — ABNORMAL HIGH (ref 0.61–1.24)
GFR, Estimated: 42 mL/min — ABNORMAL LOW (ref >60)
Glucose, Bld: 145 mg/dL — ABNORMAL HIGH (ref 70–99)
Potassium: 4.2 mmol/L (ref 3.5–5.1)
Sodium: 140 mmol/L (ref 135–145)
Total Bilirubin: 0.8 mg/dL (ref 0.0–1.2)
Total Protein: 6 g/dL — ABNORMAL LOW (ref 6.5–8.1)

## 2023-03-07 LAB — CBC
HCT: 33.6 % — ABNORMAL LOW (ref 39.0–52.0)
Hemoglobin: 10.5 g/dL — ABNORMAL LOW (ref 13.0–17.0)
MCH: 29.7 pg (ref 26.0–34.0)
MCHC: 31.3 g/dL (ref 30.0–36.0)
MCV: 94.9 fL (ref 80.0–100.0)
Platelets: 192 10*3/uL (ref 150–400)
RBC: 3.54 MIL/uL — ABNORMAL LOW (ref 4.22–5.81)
RDW: 12.9 % (ref 11.5–15.5)
WBC: 10.7 10*3/uL — ABNORMAL HIGH (ref 4.0–10.5)
nRBC: 0 % (ref 0.0–0.2)

## 2023-03-07 LAB — SAMPLE TO BLOOD BANK

## 2023-03-07 LAB — TYPE AND SCREEN
ABO/RH(D): O NEG
Antibody Screen: NEGATIVE

## 2023-03-07 LAB — URINALYSIS, ROUTINE W REFLEX MICROSCOPIC
Bacteria, UA: NONE SEEN
Bilirubin Urine: NEGATIVE
Glucose, UA: NEGATIVE mg/dL
Hgb urine dipstick: NEGATIVE
Ketones, ur: NEGATIVE mg/dL
Leukocytes,Ua: NEGATIVE
Nitrite: NEGATIVE
Protein, ur: 30 mg/dL — AB
Specific Gravity, Urine: 1.013 (ref 1.005–1.030)
pH: 5 (ref 5.0–8.0)

## 2023-03-07 LAB — I-STAT CHEM 8, ED
BUN: 28 mg/dL — ABNORMAL HIGH (ref 8–23)
Calcium, Ion: 1.14 mmol/L — ABNORMAL LOW (ref 1.15–1.40)
Chloride: 106 mmol/L (ref 98–111)
Creatinine, Ser: 1.7 mg/dL — ABNORMAL HIGH (ref 0.61–1.24)
Glucose, Bld: 142 mg/dL — ABNORMAL HIGH (ref 70–99)
HCT: 32 % — ABNORMAL LOW (ref 39.0–52.0)
Hemoglobin: 10.9 g/dL — ABNORMAL LOW (ref 13.0–17.0)
Potassium: 4.2 mmol/L (ref 3.5–5.1)
Sodium: 139 mmol/L (ref 135–145)
TCO2: 25 mmol/L (ref 22–32)

## 2023-03-07 LAB — I-STAT CG4 LACTIC ACID, ED: Lactic Acid, Venous: 1.2 mmol/L (ref 0.5–1.9)

## 2023-03-07 LAB — CBG MONITORING, ED: Glucose-Capillary: 147 mg/dL — ABNORMAL HIGH (ref 70–99)

## 2023-03-07 LAB — PROTIME-INR
INR: 1.1 (ref 0.8–1.2)
Prothrombin Time: 14.3 s (ref 11.4–15.2)

## 2023-03-07 LAB — ETHANOL: Alcohol, Ethyl (B): <10 mg/dL (ref <10)

## 2023-03-07 MED ORDER — SODIUM CHLORIDE 0.9% FLUSH: 3.0000 mL | INTRAVENOUS | Status: DC | PRN

## 2023-03-07 MED ORDER — MELATONIN 5 MG PO TABS
5.0000 mg | ORAL_TABLET | Freq: Every day | ORAL | Status: DC
  Administered 2023-03-08 – 2023-03-09 (×2): 5 mg via ORAL
  Filled 2023-03-07 (×3): qty 1

## 2023-03-07 MED ORDER — METOCLOPRAMIDE HCL 5 MG/ML IJ SOLN
10.0000 mg | Freq: Once | INTRAMUSCULAR | Status: AC
  Administered 2023-03-07: 10 mg via INTRAVENOUS
  Filled 2023-03-07: qty 2

## 2023-03-07 MED ORDER — LACTATED RINGERS IV SOLN: INTRAVENOUS | Status: AC

## 2023-03-07 MED ORDER — ACETAMINOPHEN 650 MG RE SUPP: 650.0000 mg | Freq: Four times a day (QID) | RECTAL | Status: DC | PRN

## 2023-03-07 MED ORDER — SODIUM CHLORIDE 0.9 % IV SOLN: 250.0000 mL | INTRAVENOUS | Status: AC | PRN

## 2023-03-07 MED ORDER — FERROUS SULFATE 325 (65 FE) MG PO TABS
325.0000 mg | ORAL_TABLET | Freq: Every day | ORAL | Status: DC
  Administered 2023-03-09 – 2023-03-10 (×2): 325 mg via ORAL
  Filled 2023-03-07 (×2): qty 1

## 2023-03-07 MED ORDER — OXYCODONE HCL 5 MG PO TABS
5.0000 mg | ORAL_TABLET | Freq: Four times a day (QID) | ORAL | Status: DC | PRN
  Administered 2023-03-07: 5 mg via ORAL
  Filled 2023-03-07: qty 1

## 2023-03-07 MED ORDER — DOCUSATE SODIUM 100 MG PO CAPS
100.0000 mg | ORAL_CAPSULE | Freq: Two times a day (BID) | ORAL | Status: DC
  Filled 2023-03-07 (×3): qty 1

## 2023-03-07 MED ORDER — MORPHINE SULFATE (PF) 4 MG/ML IV SOLN: 4.0000 mg | INTRAVENOUS | Status: DC | PRN

## 2023-03-07 MED ORDER — ACETAMINOPHEN 325 MG PO TABS
650.0000 mg | ORAL_TABLET | Freq: Four times a day (QID) | ORAL | Status: DC | PRN
  Filled 2023-03-07: qty 2

## 2023-03-07 MED ORDER — SENNOSIDES-DOCUSATE SODIUM 8.6-50 MG PO TABS: 1.0000 | ORAL_TABLET | Freq: Every evening | ORAL | Status: DC | PRN

## 2023-03-07 MED ORDER — ROSUVASTATIN CALCIUM 5 MG PO TABS
5.0000 mg | ORAL_TABLET | Freq: Every day | ORAL | Status: DC
  Administered 2023-03-07 – 2023-03-09 (×3): 5 mg via ORAL
  Filled 2023-03-07 (×3): qty 1

## 2023-03-07 MED ORDER — MORPHINE SULFATE (PF) 2 MG/ML IV SOLN
2.0000 mg | INTRAVENOUS | Status: DC | PRN
  Filled 2023-03-07: qty 1

## 2023-03-07 MED ORDER — SODIUM CHLORIDE 0.9% FLUSH
3.0000 mL | Freq: Two times a day (BID) | INTRAVENOUS | Status: DC
  Administered 2023-03-07 – 2023-03-10 (×4): 3 mL via INTRAVENOUS

## 2023-03-07 NOTE — ED Notes (Signed)
 Pt SpO2 dropping to mid 80s. Pt placed on 5L [place]. Pt complaining of shortness of breath. MD notified. Respirations regular.

## 2023-03-07 NOTE — ED Notes (Signed)
 Pt bib [facility] coming from [facility]. Witnessed fall by staff, landed on right hip. Did not report any LOC. Pt has blood in mouth and lost some teeth. Pt on plavix. Shortening in rotation to right leg and skin tear to back of right arm. 2L [place] (baseline). Pt alert and oriented.      fentanyl  20RAC 4mg  zofran

## 2023-03-07 NOTE — ED Triage Notes (Signed)
 Pt bib [facility] coming from [facility]. Witnessed fall by staff, landed on right hip. Did not report any LOC. Pt has blood in mouth and lost some teeth. Pt on plavix. Shortening in rotation to right leg and skin tear to back of right arm. 2L [place] (baseline). Pt alert and oriented.      fentanyl  20RAC 4mg  zofran

## 2023-03-07 NOTE — Consult Note (Signed)
 Reason for Consult:right hip fracture Referring Physician: ED  [name] [name] is an 89 y.o. male.  HPI: Patient is an 89 year old male with a history of CAD on Plavix, hypertension, PUD, right bundle branch block, diabetes who is presenting today as a level 2 fall. Since the fall he has had severe pain in his right hip and has been unable to ambulate. EMS reported deformity of the right leg and severe pain. Patient's family in room and help provide history.   Past Medical History:  Diagnosis Date   Bronchitis    Hx of   CAD (coronary artery disease)    (2005 LAD 30-40% stenosis  followed by 60-65% focal stenosis.  The second diagonal had 80%   stenosis.  The circumflex had 85-90% mid stenosis and was stented with a  Taxus stent.  The obtuse marginal had 20-25% stenosis and obtuse   marginal-2 had 40-50% stenosis.  The right coronary artery had 40-50%  proximal stenosis and mid 75% stenosis), well-preserved ejection  fraction (50-55% )   Carotid stenosis    History of colonic polyps    HTN (hypertension)    Myocardial infarction [facility])    Obesity    Prostate cancer (HCC) 2001   Radiation    PUD (peptic ulcer disease)    Radiation proctitis    Right bundle branch block    Sigmoid diverticulosis    Skin cancer    Sleep apnea    Tobacco abuse    Type 2 diabetes mellitus (HCC)     Past Surgical History:  Procedure Laterality Date   CARDIAC CATHETERIZATION  12/13/2003   ptca, stent x 2   CHOLECYSTECTOMY     COLONOSCOPY  05/11/2000   COLONOSCOPY W/ POLYPECTOMY     CYSTOSCOPY W/ RETROGRADES Bilateral 05/13/2018   Procedure: CYSTOSCOPY WITH RETROGRADE PYELOGRAM;  Surgeon: Marcine Matar, MD;  Location: [facility];  Service: Urology;  Laterality: Bilateral;   HERNIA REPAIR     right inguinal   Left Arm repair  1994   TRANSURETHRAL RESECTION OF BLADDER TUMOR WITH MITOMYCIN-C N/A 05/13/2018   Procedure: TRANSURETHRAL RESECTION OF BLADDER TUMOR WITH MITOMYCIN-C;  Surgeon: Marcine Matar, MD;   Location: [facility];  Service: Urology;  Laterality: N/A;  30 MNS    Family History  Problem Relation Age of Onset   Cancer Father    Coronary artery disease Father 23   Coronary artery disease Brother 59   Coronary artery disease Sister 23   Stroke Mother 55    Social History:  reports that he has been smoking cigarettes. He has a 60 pack-year smoking history. He has never used smokeless tobacco. He reports that he does not drink alcohol and does not use drugs.  Allergies: No Known Allergies  Medications: I have reviewed the patient's current medications.  Results for orders placed or performed during the hospital encounter of 03/07/23 (from the past 48 hours)  Sample to Blood Bank     Status: None   Collection Time: 03/07/23  5:09 PM  Result Value Ref Range   Blood Bank Specimen SAMPLE AVAILABLE FOR TESTING    Sample Expiration      03/10/2023,2359 Performed at [facility], [address] [place], [place] [address]   Comprehensive metabolic panel     Status: Abnormal   Collection Time: 03/07/23  5:14 PM  Result Value Ref Range   Sodium 140 135 - 145 mmol/L   Potassium 4.2 3.5 - 5.1 mmol/L   Chloride 105  98 - 111 mmol/L   CO2 24 22 - 32 mmol/L   Glucose, Bld 145 (H) 70 - 99 mg/dL    Comment: Glucose reference range applies only to samples taken after fasting for at least 8 hours.   BUN 21 8 - 23 mg/dL   Creatinine, Ser 1.61 (H) 0.61 - 1.24 mg/dL   Calcium 9.0 8.9 - 09.6 mg/dL   Total Protein 6.0 (L) 6.5 - 8.1 g/dL   Albumin 3.0 (L) 3.5 - 5.0 g/dL   AST 16 15 - 41 U/L   ALT 7 0 - 44 U/L   Alkaline Phosphatase 50 38 - 126 U/L   Total Bilirubin 0.8 0.0 - 1.2 mg/dL   GFR, Estimated 42 (L) >60 mL/min    Comment: (NOTE) Calculated using the CKD-EPI Creatinine Equation (2021)    Anion gap 11 5 - 15    Comment: Performed at [facility], [address] [place], [place] [address]  CBC     Status: Abnormal   Collection Time: 03/07/23  5:14 PM  Result Value Ref  Range   WBC 10.7 (H) 4.0 - 10.5 K/uL   RBC 3.54 (L) 4.22 - 5.81 MIL/uL   Hemoglobin 10.5 (L) 13.0 - 17.0 g/dL   HCT 98.1 (L) 19.1 - 47.8 %   MCV 94.9 80.0 - 100.0 fL   MCH 29.7 26.0 - 34.0 pg   MCHC 31.3 30.0 - 36.0 g/dL   RDW 29.5 62.1 - 30.8 %   Platelets 192 150 - 400 K/uL   nRBC 0.0 0.0 - 0.2 %    Comment: Performed at [facility], [address] [place], [place] [address]  Ethanol     Status: None   Collection Time: 03/07/23  5:14 PM  Result Value Ref Range   Alcohol, Ethyl (B) <10 <10 mg/dL    Comment: (NOTE) Lowest detectable limit for serum alcohol is 10 mg/dL.  For medical purposes only. Performed at [facility], [address], [place], [place] [address]   Protime-INR     Status: None   Collection Time: 03/07/23  5:14 PM  Result Value Ref Range   Prothrombin Time 14.3 11.4 - 15.2 seconds   INR 1.1 0.8 - 1.2    Comment: (NOTE) INR goal varies based on device and disease states. Performed at [facility], [address] [place], [place] [address]   I-Stat Chem 8, ED     Status: Abnormal   Collection Time: 03/07/23  5:30 PM  Result Value Ref Range   Sodium 139 135 - 145 mmol/L   Potassium 4.2 3.5 - 5.1 mmol/L   Chloride 106 98 - 111 mmol/L   BUN 28 (H) 8 - 23 mg/dL   Creatinine, Ser 3.24 (H) 0.61 - 1.24 mg/dL   Glucose, Bld 401 (H) 70 - 99 mg/dL    Comment: Glucose reference range applies only to samples taken after fasting for at least 8 hours.   Calcium, Ion 1.14 (L) 1.15 - 1.40 mmol/L   TCO2 25 22 - 32 mmol/L   Hemoglobin 10.9 (L) 13.0 - 17.0 g/dL   HCT 02.7 (L) 25.3 - 66.4 %  I-Stat Lactic Acid, ED     Status: None   Collection Time: 03/07/23  5:31 PM  Result Value Ref Range   Lactic Acid, Venous 1.2 0.5 - 1.9 mmol/L    DG Pelvis Portable Result Date: 03/07/2023 CLINICAL DATA:  Trauma EXAM: PORTABLE PELVIS 1-2 VIEWS  COMPARISON:  X-ray pelvis 10/03/2022 FINDINGS: Limited evaluation due to overlapping osseous structures and overlying  soft tissues. Question poorly visualized acute, likely intertrochanteric, fracture of the right proximal femur. No acute displaced fracture or dislocation of the left hip on frontal view. There is no evidence of pelvic fracture or diastasis. No pelvic bone lesions are seen. Vascular calcification. IMPRESSION: Question poorly visualized acute, likely intertrochanteric, fracture of the right proximal femur. Electronically Signed   By: Tish Frederickson M.D.   On: 03/07/2023 17:38   DG Chest Port 1 View Result Date: 03/07/2023 CLINICAL DATA:  Trauma  trauma level 2, fall, pain on right hip EXAM: PORTABLE CHEST 1 VIEW COMPARISON:  Chest x-ray 10/03/2022 FINDINGS: The heart and mediastinal contours are unchanged. Atherosclerotic plaque. Low lung volumes. No focal consolidation. Chronic coarsened interstitial markings with no overt pulmonary edema. No pleural effusion. No pneumothorax. No acute osseous abnormality. Degenerative changes of the bilateral shoulders. IMPRESSION: No active disease. Electronically Signed   By: Tish Frederickson M.D.   On: 03/07/2023 17:36    Review of Systems  Constitutional: Negative.  Negative for chills and fever.  Respiratory:  Positive for shortness of breath. Negative for cough.   Cardiovascular:  Negative for chest pain.  Gastrointestinal:  Negative for abdominal pain.  Musculoskeletal:  Positive for arthralgias.  Neurological:  Negative for dizziness.  Psychiatric/Behavioral:  Negative for agitation.    Blood pressure 116/60, pulse 80, temperature (!) 97.5 F (36.4 C), temperature source Oral, resp. rate 14, SpO2 (!) 88%. Physical Exam Constitutional:      General: He is not in acute distress.    Appearance: Normal appearance. He is normal weight.  HENT:     Head: Normocephalic and atraumatic.  Eyes:     Extraocular Movements: Extraocular movements intact.  Cardiovascular:     Rate and Rhythm: Normal rate.     Pulses: Normal pulses.  Pulmonary:     Effort:  Pulmonary effort is normal.  Musculoskeletal:     Comments: Patient tender to palpation about the right hip. No open wounds or abrasions about the RLE. RLE externally rotated. Calves soft and nontender. Distally neurovascularly intact. Able to dorsiflex and plantar flex BLE.   Neurological:     General: No focal deficit present.     Mental Status: He is alert. Mental status is at baseline.  Psychiatric:        Mood and Affect: Mood normal.        Behavior: Behavior normal.     Assessment/Plan: Right intertrochanteric hip fracture Plan for right hip IM nail tomorrow. Patient to be NPO after midnight but may eat today. Hold Plavix but will resume day after surgery likely. Medicine will admit patient. NWB at this time. Discussed the case with the patient and his daughter including expected postoperative course, risks, and benefits. Dr Ave Filter posted case for tomorrow about 1030am.   Takima Encina L. [place], PA-C 03/07/2023, 6:30 PM

## 2023-03-07 NOTE — H&P (Signed)
 History and Physical    [name] [name]:[id number] DOB: 03-09-1932 DOA: 03/07/2023  PCP: Chilton Greathouse, MD   Patient coming from: Home   Chief Complaint:  Chief Complaint  Patient presents with   Fall   ED TRIAGE note:Pt bib [facility] coming from [facility]. Witnessed fall by staff, landed on right hip. Did not report any LOC. Pt has blood in mouth and lost some teeth. Pt on plavix. Shortening in rotation to right leg and skin tear to back of right arm. 2L [place] (baseline). Pt alert and oriented.       HPI:  [name] [name] is a 89 y.o. male with medical history significant of TIA, DM type II, CKD stage IIIb, hyperlipidemia, essential hypertension, COPD, chronic hypoxic respiratory failure 2 L oxygen at baseline, former smoker,CAD, carotid stenosis, GERD, secondary AV block, pulmonary nodule and prolonged QT presented to emergency department after an sustained fall.  Patient reports that he had came home and he was hanging up his coat and hat on the back of the door when he lost his balance and fell backwards onto the floor.  He does report hitting his head on the floor but denies loss of consciousness.  Since the fall he has had severe pain in his right hip and has been unable to ambulate.  EMS reported deformity of the right leg and severe pain.  Patient received 100 mcg of fentanyl and route.  He currently only complains of pain in his right hip.  Patient denies any headache, neck pain, dizziness, loss of consciousness, chest pain, shortness of breath, abdominal pain bilateral lower extremity swelling.  Denies any fever, chill, nausea and vomiting.  No other complaint at this time.  At presentation to ED patient is hemodynamically stable but currently O2 sat 88% on 3 L oxygen. Normal lactic acid level. CMP showing creatinine 1.57 and GFR 42 which is baseline. CBC showing mild leukocytosis 10.  7.  H&H 10.5 and 33.6.  Baseline hemoglobin around 12-14. Normal blood alcohol  able. Normal pro time INR.  EKG showing sinus and ectopic atrial rhythm heart rate 72.  Sinus pause, prolonged PR interval.  Right bundle branch block.  CT head no acute intracranial abnormality.  Stable atrophy and advanced chronic pastel ischemia.  Chest x-ray no active disease.  X-ray pelvis acute intertrochanteric right proximal femoral fracture.  In the ED patient has been evaluated by orthopedics team plan for right hip IM nail tomorrow 3/2.  Recommended keep n.p.o. after midnight, hold the Plavix but can resume day after surgery.  Hospitalist has been contacted for further management and admission for intertrochanteric right femoral fracture.   Significant labs in the ED: Lab Orders         Comprehensive metabolic panel         CBC         Ethanol         Urinalysis, Routine w reflex microscopic -Urine, Clean Catch         Protime-INR         CBC         Comprehensive metabolic panel         I-Stat Chem 8, ED         I-Stat Lactic Acid, ED       Review of Systems:  Review of Systems  Constitutional:  Negative for chills, fever, malaise/fatigue and weight loss.  HENT:  Positive for hearing loss.   Eyes:  Negative for blurred  vision.  Respiratory:  Negative for cough, sputum production and shortness of breath.   Cardiovascular:  Negative for chest pain.  Gastrointestinal:  Negative for abdominal pain, heartburn, nausea and vomiting.  Genitourinary:  Negative for dysuria and urgency.  Musculoskeletal:  Positive for back pain, falls and joint pain. Negative for myalgias and neck pain.  Neurological:  Negative for dizziness and headaches.  Endo/Heme/Allergies:  Does not bruise/bleed easily.  Psychiatric/Behavioral:  The patient is not nervous/anxious.   All other systems reviewed and are negative.   Past Medical History:  Diagnosis Date   Bronchitis    Hx of   CAD (coronary artery disease)    (2005 LAD 30-40% stenosis  followed by 60-65% focal stenosis.  The  second diagonal had 80%   stenosis.  The circumflex had 85-90% mid stenosis and was stented with a  Taxus stent.  The obtuse marginal had 20-25% stenosis and obtuse   marginal-2 had 40-50% stenosis.  The right coronary artery had 40-50%  proximal stenosis and mid 75% stenosis), well-preserved ejection  fraction (50-55% )   Carotid stenosis    History of colonic polyps    HTN (hypertension)    Myocardial infarction [facility])    Obesity    Prostate cancer (HCC) 2001   Radiation    PUD (peptic ulcer disease)    Radiation proctitis    Right bundle branch block    Sigmoid diverticulosis    Skin cancer    Sleep apnea    Tobacco abuse    Type 2 diabetes mellitus (HCC)     Past Surgical History:  Procedure Laterality Date   CARDIAC CATHETERIZATION  12/13/2003   ptca, stent x 2   CHOLECYSTECTOMY     COLONOSCOPY  05/11/2000   COLONOSCOPY W/ POLYPECTOMY     CYSTOSCOPY W/ RETROGRADES Bilateral 05/13/2018   Procedure: CYSTOSCOPY WITH RETROGRADE PYELOGRAM;  Surgeon: Marcine Matar, MD;  Location: [facility];  Service: Urology;  Laterality: Bilateral;   HERNIA REPAIR     right inguinal   Left Arm repair  1994   TRANSURETHRAL RESECTION OF BLADDER TUMOR WITH MITOMYCIN-C N/A 05/13/2018   Procedure: TRANSURETHRAL RESECTION OF BLADDER TUMOR WITH MITOMYCIN-C;  Surgeon: Marcine Matar, MD;  Location: [facility];  Service: Urology;  Laterality: N/A;  30 MNS     reports that he has been smoking cigarettes. He has a 60 pack-year smoking history. He has never used smokeless tobacco. He reports that he does not drink alcohol and does not use drugs.  No Known Allergies  Family History  Problem Relation Age of Onset   Cancer Father    Coronary artery disease Father 105   Coronary artery disease Brother 45   Coronary artery disease Sister 56   Stroke Mother 37    Prior to Admission medications   Medication Sig Start Date End Date Taking? Authorizing Provider  acetaminophen (TYLENOL) 325 MG tablet Take 650 mg  by mouth every 6 (six) hours as needed for mild pain.    [provider]  albuterol (VENTOLIN HFA) 108 (90 Base) MCG/ACT inhaler Inhale 2 puffs into the lungs every 4 (four) hours as needed for wheezing or shortness of breath.    [provider]  clopidogrel (PLAVIX) 75 MG tablet Take 75 mg by mouth daily.    [provider]  escitalopram (LEXAPRO) 10 MG tablet Take 10 mg by mouth daily. 12/02/21   [provider]  ferrous sulfate 325 (65 FE) MG EC tablet Take 325 mg by  mouth daily with breakfast.     [provider]  irbesartan-hydrochlorothiazide (AVALIDE) 300-12.5 MG per tablet Take 1 tablet by mouth daily. 01/31/14   [name], MD  loperamide (IMODIUM) 2 MG capsule Take 2 mg by mouth 3 (three) times daily as needed for diarrhea or loose stools. 08/11/22   [provider]  magnesium oxide (MAG-OX) 400 (240 Mg) MG tablet Take 400 mg by mouth daily.    [provider]  melatonin 5 MG TABS Take 5 mg by mouth at bedtime.    [provider]  pantoprazole (PROTONIX) 40 MG tablet Take 1 tablet (40 mg total) by mouth daily. 02/06/22   Tyrone Nine, MD  rosuvastatin (CRESTOR) 5 MG tablet Take 5 mg by mouth at bedtime. 11/12/17   [provider]  VOLTAREN ARTHRITIS PAIN 1 % GEL Apply 2 g topically in the morning, at noon, and at bedtime. 09/15/22   [provider]     Physical Exam: Vitals:   03/07/23 1811 03/07/23 1820 03/07/23 1821 03/07/23 1915  BP:    (!) 107/50  Pulse: 72 79 80 85  Resp: 20 18 14  (!) 22  Temp:      TempSrc:      SpO2: (!) 89% (!) 88% (!) 88%     Physical Exam Constitutional:      General: He is not in acute distress.    Appearance: He is not ill-appearing.  HENT:     Mouth/Throat:     Mouth: Mucous membranes are moist.  Eyes:     Conjunctiva/sclera: Conjunctivae normal.  Cardiovascular:     Rate and Rhythm: Normal rate and regular rhythm.     Pulses: Normal pulses.     Heart  sounds: Normal heart sounds.  Pulmonary:     Effort: Pulmonary effort is normal.     Breath sounds: Normal breath sounds.  Abdominal:     Palpations: Abdomen is soft.  Musculoskeletal:        General: Tenderness and deformity present.     Cervical back: Neck supple.     Right lower leg: No edema.     Left lower leg: No edema.  Skin:    General: Skin is warm and dry.     Capillary Refill: Capillary refill takes less than 2 seconds.  Neurological:     Mental Status: He is alert and oriented to person, place, and time.  Psychiatric:        Mood and Affect: Mood normal.        Thought Content: Thought content normal.      Labs on Admission: I have personally reviewed following labs and imaging studies  CBC: Recent Labs  Lab 03/07/23 1714 03/07/23 1730  WBC 10.7*  --   HGB 10.5* 10.9*  HCT 33.6* 32.0*  MCV 94.9  --   PLT 192  --    Basic Metabolic Panel: Recent Labs  Lab 03/07/23 1714 03/07/23 1730  NA 140 139  K 4.2 4.2  CL 105 106  CO2 24  --   GLUCOSE 145* 142*  BUN 21 28*  CREATININE 1.58* 1.70*  CALCIUM 9.0  --    GFR: CrCl cannot be calculated (Unknown ideal weight.). Liver Function Tests: Recent Labs  Lab 03/07/23 1714  AST 16  ALT 7  ALKPHOS 50  BILITOT 0.8  PROT 6.0*  ALBUMIN 3.0*   No results for input(s): "LIPASE", "AMYLASE" in the last 168 hours. No results for input(s): "AMMONIA" in the  last 168 hours. Coagulation Profile: Recent Labs  Lab 03/07/23 1714  INR 1.1   Cardiac Enzymes: No results for input(s): "CKTOTAL", "CKMB", "CKMBINDEX", "TROPONINI", "TROPONINIHS" in the last 168 hours. BNP (last 3 results) No results for input(s): "BNP" in the last 8760 hours. HbA1C: No results for input(s): "HGBA1C" in the last 72 hours. CBG: No results for input(s): "GLUCAP" in the last 168 hours. Lipid Profile: No results for input(s): "CHOL", "HDL", "LDLCALC", "TRIG", "CHOLHDL", "LDLDIRECT" in the last 72 hours. Thyroid Function Tests: No  results for input(s): "TSH", "T4TOTAL", "FREET4", "T3FREE", "THYROIDAB" in the last 72 hours. Anemia Panel: No results for input(s): "VITAMINB12", "FOLATE", "FERRITIN", "TIBC", "IRON", "RETICCTPCT" in the last 72 hours. Urine analysis:    Component Value Date/Time   COLORURINE YELLOW 10/03/2022 1841   APPEARANCEUR CLEAR 10/03/2022 1841   LABSPEC 1.014 10/03/2022 1841   PHURINE 6.0 10/03/2022 1841   GLUCOSEU 50 (A) 10/03/2022 1841   HGBUR MODERATE (A) 10/03/2022 1841   BILIRUBINUR NEGATIVE 10/03/2022 1841   KETONESUR NEGATIVE 10/03/2022 1841   PROTEINUR >=300 (A) 10/03/2022 1841   UROBILINOGEN 0.2 01/02/2011 1459   NITRITE NEGATIVE 10/03/2022 1841   LEUKOCYTESUR NEGATIVE 10/03/2022 1841    Radiological Exams on Admission: I have personally reviewed images CT Head Wo Contrast Result Date: 03/07/2023 CLINICAL DATA:  Head trauma, minor (Age >= 65y) EXAM: CT HEAD WITHOUT CONTRAST TECHNIQUE: Contiguous axial images were obtained from the base of the skull through the vertex without intravenous contrast. RADIATION DOSE REDUCTION: This exam was performed according to the departmental dose-optimization program which includes automated exposure control, adjustment of the mA and/or kV according to patient size and/or use of iterative reconstruction technique. COMPARISON:  Head CT and brain MRI 09/26/2022 FINDINGS: Brain: No intracranial hemorrhage, mass effect, or midline shift. Stable atrophy. No hydrocephalus. The basilar cisterns are patent. Stable advanced chronic small vessel ischemia. Remote lacunar infarcts in the basal ganglia. No evidence of territorial infarct or acute ischemia. No extra-axial or intracranial fluid collection. Vascular: Atherosclerosis of skullbase vasculature without hyperdense vessel or abnormal calcification. Skull: No fracture or focal lesion. Sinuses/Orbits: Chronic mucosal thickening throughout the ethmoid air cells. There is increased mucosal thickening of both maxillary  sinuses, no fluid levels. Scattered opacification of the mastoid air cells. Left cataract resection. Other: No confluent scalp hematoma. IMPRESSION: 1. No acute intracranial abnormality. No skull fracture. 2. Stable atrophy and advanced chronic small vessel ischemia. 3. Increased mucosal thickening of both maxillary sinuses. Electronically Signed   By: Narda Rutherford M.D.   On: 03/07/2023 18:45   DG Pelvis Portable Result Date: 03/07/2023 CLINICAL DATA:  Trauma EXAM: PORTABLE PELVIS 1-2 VIEWS COMPARISON:  X-ray pelvis 10/03/2022 FINDINGS: Limited evaluation due to overlapping osseous structures and overlying soft tissues. Question poorly visualized acute, likely intertrochanteric, fracture of the right proximal femur. No acute displaced fracture or dislocation of the left hip on frontal view. There is no evidence of pelvic fracture or diastasis. No pelvic bone lesions are seen. Vascular calcification. IMPRESSION: Question poorly visualized acute, likely intertrochanteric, fracture of the right proximal femur. Electronically Signed   By: Tish Frederickson M.D.   On: 03/07/2023 17:38   DG Chest Port 1 View Result Date: 03/07/2023 CLINICAL DATA:  Trauma  trauma level 2, fall, pain on right hip EXAM: PORTABLE CHEST 1 VIEW COMPARISON:  Chest x-ray 10/03/2022 FINDINGS: The heart and mediastinal contours are unchanged. Atherosclerotic plaque. Low lung volumes. No focal consolidation. Chronic coarsened interstitial markings with no overt pulmonary edema. No pleural effusion.  No pneumothorax. No acute osseous abnormality. Degenerative changes of the bilateral shoulders. IMPRESSION: No active disease. Electronically Signed   By: Tish Frederickson M.D.   On: 03/07/2023 17:36     EKG: My personal interpretation of EKG shows: Sinus and ectopic atrial rhythm heart rate 72, sinus pauses, prolonged PR interval and right bundle branch block.     Assessment/Plan: Principal Problem:   Intertrochanteric fracture of right  femur (HCC) Active Problems:   Non-insulin dependent type 2 diabetes mellitus (HCC)   Hyperlipidemia   Essential hypertension   Acute on chronic anemia   COPD (chronic obstructive pulmonary disease) (HCC)   Bilateral carotid artery stenosis   GERD (gastroesophageal reflux disease)   Second degree AV block   TIA (transient ischemic attack)   CKD stage 3b, GFR 30-44 ml/min (HCC)   Long Q-T syndrome   History of CAD (coronary artery disease)   GAD (generalized anxiety disorder)   Chronic hypoxic respiratory failure (HCC)    Assessment and Plan: Right femoral intertrochanteric fracture Mechanical fall -Patient presenting with a mechanical fall ground-level.  Since then developing right-sided hip pain. - At presentation today hemodynamically stable. -Lab lab work unremarkable except CBC showing dropping of hemoglobin. -CT head no evidence of intracranial abnormality. - X-ray of the hip showing intertrochanteric right proximal femoral fracture. - In the ED patient has been evaluated by orthopedics team plan for right hip IM nail tomorrow 3/2.  Recommended keep n.p.o. after midnight, hold the Plavix but can resume day after surgery. -Holding Plavix and pharmacologic DVT prophylaxis until surgery. - Continue pain management with Tylenol, Roxicodone and morphine as needed. - Continue diet and n.p.o. after midnight. - Will continue maintenance fluid LR after midnight. - Continue cardiac monitoring. - Need to consult PT and OT postsurgery.   History of secondary AV block History of QT prolongation -EKG showing ectopic atrial rhythm, sinus pause, prolonged PR interval, heart rate 72.  Patient has history of secondary AV block and QT prolongation. - Avoid any QT prolonging medication. - Continue cardiac monitoring.  Acute on chronic anemia -Hemoglobin 10.5 and hematocrit 33.6.  Baseline hemoglobin around 10-14. - Will obtain type and screen in case patient will need blood transfusion in  the future. -Continue to monitor CBC.  Essential hypertension -Blood pressure within good range.   Holding oral diuretics given blood pressure within good range and patient is going to have any surgical procedure tomorrow.  Non-insulin-dependent DM type II History of DM type II diet controlled. Continue to check POC blood glucose and n.p.o. after midnight.  History of COPD Chronic hypoxic respiratory failure 2 L oxygen at baseline -O2 sat 88% on 2 L oxygen.  Goal to keep O2 sat above 92%.  Continue to monitor pulse ox and supplemental oxygen.  Continue DuoNeb as needed for wheezing or shortness of breath.  Bilateral catheter artery stenosis CAD TIA -Continue Crestor. - Holding Plavix for surgical intervention.  Will resume 1 day after surgery per orthopedics recommendation.  CKD stage IIIb -Creatinine and GFR at baseline.  Continue to monitor renal function.  Avoid nephrotoxic agent.  Generalized anxiety disorder -Holding Celexa given patient has secondary AV block and prolonged QTc.   DVT prophylaxis:  SCDs.  Holding pharmacological DVT prophylaxis until surgery. Code Status:  Full Code Diet: Carb modified diet.  Will be n.p.o. after midnight Family Communication:   Family was present at bedside, at the time of interview. Opportunity was given to ask question and all questions were answered satisfactorily.  Disposition Plan: Plan for orthopedic surgery tomorrow.  Need to consult PT and OT afterward for possible SNF placement Consults: None at this time Admission status:   Inpatient, Telemetry bed  Severity of Illness: The appropriate patient status for this patient is INPATIENT. Inpatient status is judged to be reasonable and necessary in order to provide the required intensity of service to ensure the patient's safety. The patient's presenting symptoms, physical exam findings, and initial radiographic and laboratory data in the context of their chronic comorbidities is felt to  place them at high risk for further clinical deterioration. Furthermore, it is not anticipated that the patient will be medically stable for discharge from the hospital within 2 midnights of admission.   * I certify that at the point of admission it is my clinical judgment that the patient will require inpatient hospital care spanning beyond 2 midnights from the point of admission due to high intensity of service, high risk for further deterioration and high frequency of surveillance required.Marland Kitchen    Tereasa Coop, MD [facility]  How to contact the [facility] Attending or Consulting provider 7A - 7P or covering provider during after hours 7P -7A, for this patient.  Check the care team in [facility] and look for a) attending/consulting [facility] provider listed and b) the [facility] team listed Log into [email or web address] and use [facility]'s universal password to access. If you do not have the password, please contact the hospital operator. Locate the [facility] provider you are looking for under [facility] and page to a number that you can be directly reached. If you still have difficulty reaching the provider, please page the [facility] (Director on Call) for the Hospitalists listed on amion for assistance.  03/07/2023, 7:34 PM

## 2023-03-07 NOTE — ED Provider Notes (Signed)
 [facility] AT [facility] Provider Note   CSN: [id number] Arrival date & time: 03/07/23  1704     History  Chief Complaint  Patient presents with   [name] [name]    [name] [name] is a 89 y.o. male.  Patient is an 89 year old male with a history of CAD on Plavix, hypertension, PUD, right bundle branch block, diabetes who is presenting today as a level 2 fall.  Patient reports that he had come home and he was hanging up his coat and hat on the back of the door when he lost his balance and fell backwards onto the floor.  He does report hitting his head on the floor but denies loss of consciousness.  Since the fall he has had severe pain in his right hip and has been unable to ambulate.  EMS reported deformity of the right leg and severe pain.  Patient received 100 mcg of fentanyl and route.  He currently only complains of pain in his right hip and of the skin on his right arm.  He has no headache or neck pain at this time.  He denies any chest pain or shortness of breath.  He remembers the entire event and denies feeling dizzy or lightheaded prior to the fall.  The history is provided by the patient, the EMS personnel, a relative and medical records.  Fall       Home Medications Prior to Admission medications   Medication Sig Start Date End Date Taking? Authorizing Provider  acetaminophen (TYLENOL) 325 MG tablet Take 650 mg by mouth every 6 (six) hours as needed for mild pain.    [provider]  albuterol (VENTOLIN HFA) 108 (90 Base) MCG/ACT inhaler Inhale 2 puffs into the lungs every 4 (four) hours as needed for wheezing or shortness of breath.    [provider]  clopidogrel (PLAVIX) 75 MG tablet Take 75 mg by mouth daily.    [provider]  escitalopram (LEXAPRO) 10 MG tablet Take 10 mg by mouth daily. 12/02/21   [provider]  ferrous sulfate 325 (65 FE) MG EC tablet Take 325 mg by mouth daily with breakfast.     [provider]  irbesartan-hydrochlorothiazide (AVALIDE) 300-12.5 MG per tablet Take 1 tablet by mouth daily. 01/31/14   [name], MD  loperamide (IMODIUM) 2 MG capsule Take 2 mg by mouth 3 (three) times daily as needed for diarrhea or loose stools. 08/11/22   [provider]  magnesium oxide (MAG-OX) 400 (240 Mg) MG tablet Take 400 mg by mouth daily.    [provider]  melatonin 5 MG TABS Take 5 mg by mouth at bedtime.    [provider]  pantoprazole (PROTONIX) 40 MG tablet Take 1 tablet (40 mg total) by mouth daily. 02/06/22   Tyrone Nine, MD  rosuvastatin (CRESTOR) 5 MG tablet Take 5 mg by mouth at bedtime. 11/12/17   [provider]  VOLTAREN ARTHRITIS PAIN 1 % GEL Apply 2 g topically in the morning, at noon, and at bedtime. 09/15/22   [provider]      Allergies    Patient has no known allergies.    Review of Systems   Review of Systems  Physical Exam Updated Vital Signs BP 116/60   Pulse 80   Temp (!) 97.5 F (36.4 C) (Oral)   Resp 14   SpO2 (!) 88%  Physical Exam Vitals and nursing note reviewed.  Constitutional:  General: He is not in acute distress.    Appearance: He is well-developed.  HENT:     Head: Normocephalic and atraumatic.     Mouth/Throat:     Comments: Minimal bleeding around the left upper lateral incisor.  No lip lacerations or tongue lacerations. Eyes:     Conjunctiva/sclera: Conjunctivae normal.     Pupils: Pupils are equal, round, and reactive to light.  Cardiovascular:     Rate and Rhythm: Normal rate and regular rhythm.     Pulses: Normal pulses.     Heart sounds: No murmur heard. Pulmonary:     Effort: Pulmonary effort is normal. No respiratory distress.     Breath sounds: Normal breath sounds. No wheezing or rales.  Abdominal:     General: There is no distension.     Palpations: Abdomen is soft.     Tenderness: There is no abdominal tenderness. There is no guarding or rebound.   Musculoskeletal:        General: Tenderness and deformity present.       Arms:     Cervical back: Normal range of motion and neck supple.     Right hip: Deformity, tenderness and bony tenderness present. Decreased range of motion.     Right lower leg: No edema.     Left lower leg: No edema.  Skin:    General: Skin is warm and dry.     Findings: No erythema or rash.  Neurological:     Mental Status: He is alert and oriented to person, place, and time. Mental status is at baseline.  Psychiatric:        Mood and Affect: Mood normal.        Behavior: Behavior normal.     ED Results / Procedures / Treatments   Labs (all labs ordered are listed, but only abnormal results are displayed) Labs Reviewed  COMPREHENSIVE METABOLIC PANEL - Abnormal; Notable for the following components:      Result Value   Glucose, Bld 145 (*)    Creatinine, Ser 1.58 (*)    Total Protein 6.0 (*)    Albumin 3.0 (*)    GFR, Estimated 42 (*)    All other components within normal limits  CBC - Abnormal; Notable for the following components:   WBC 10.7 (*)    RBC 3.54 (*)    Hemoglobin 10.5 (*)    HCT 33.6 (*)    All other components within normal limits  I-STAT CHEM 8, ED - Abnormal; Notable for the following components:   BUN 28 (*)    Creatinine, Ser 1.70 (*)    Glucose, Bld 142 (*)    Calcium, Ion 1.14 (*)    Hemoglobin 10.9 (*)    HCT 32.0 (*)    All other components within normal limits  ETHANOL  PROTIME-INR  URINALYSIS, ROUTINE W REFLEX MICROSCOPIC  I-STAT CG4 LACTIC ACID, ED  SAMPLE TO BLOOD BANK    EKG EKG Interpretation Date/Time:  Saturday March 07 2023 17:21:37 EST Ventricular Rate:  72 PR Interval:  264 QRS Duration:  161 QT Interval:  444 QTC Calculation: 486 R Axis:   2  Text Interpretation: Sinus or ectopic atrial rhythm Sinus pause Prolonged PR interval Right bundle branch block No significant change since last tracing Confirmed by Gwyneth Sprout ([id number]) on 03/07/2023  5:36:04 PM  Radiology CT Head Wo Contrast Result Date: 03/07/2023 CLINICAL DATA:  Head trauma, minor (Age >= 65y) EXAM: CT HEAD WITHOUT CONTRAST TECHNIQUE: Contiguous  axial images were obtained from the base of the skull through the vertex without intravenous contrast. RADIATION DOSE REDUCTION: This exam was performed according to the departmental dose-optimization program which includes automated exposure control, adjustment of the mA and/or kV according to patient size and/or use of iterative reconstruction technique. COMPARISON:  Head CT and brain MRI 09/26/2022 FINDINGS: Brain: No intracranial hemorrhage, mass effect, or midline shift. Stable atrophy. No hydrocephalus. The basilar cisterns are patent. Stable advanced chronic small vessel ischemia. Remote lacunar infarcts in the basal ganglia. No evidence of territorial infarct or acute ischemia. No extra-axial or intracranial fluid collection. Vascular: Atherosclerosis of skullbase vasculature without hyperdense vessel or abnormal calcification. Skull: No fracture or focal lesion. Sinuses/Orbits: Chronic mucosal thickening throughout the ethmoid air cells. There is increased mucosal thickening of both maxillary sinuses, no fluid levels. Scattered opacification of the mastoid air cells. Left cataract resection. Other: No confluent scalp hematoma. IMPRESSION: 1. No acute intracranial abnormality. No skull fracture. 2. Stable atrophy and advanced chronic small vessel ischemia. 3. Increased mucosal thickening of both maxillary sinuses. Electronically Signed   By: Narda Rutherford M.D.   On: 03/07/2023 18:45   DG Pelvis Portable Result Date: 03/07/2023 CLINICAL DATA:  Trauma EXAM: PORTABLE PELVIS 1-2 VIEWS COMPARISON:  X-ray pelvis 10/03/2022 FINDINGS: Limited evaluation due to overlapping osseous structures and overlying soft tissues. Question poorly visualized acute, likely intertrochanteric, fracture of the right proximal femur. No acute displaced fracture or  dislocation of the left hip on frontal view. There is no evidence of pelvic fracture or diastasis. No pelvic bone lesions are seen. Vascular calcification. IMPRESSION: Question poorly visualized acute, likely intertrochanteric, fracture of the right proximal femur. Electronically Signed   By: Tish Frederickson M.D.   On: 03/07/2023 17:38   DG Chest Port 1 View Result Date: 03/07/2023 CLINICAL DATA:  Trauma  trauma level 2, fall, pain on right hip EXAM: PORTABLE CHEST 1 VIEW COMPARISON:  Chest x-ray 10/03/2022 FINDINGS: The heart and mediastinal contours are unchanged. Atherosclerotic plaque. Low lung volumes. No focal consolidation. Chronic coarsened interstitial markings with no overt pulmonary edema. No pleural effusion. No pneumothorax. No acute osseous abnormality. Degenerative changes of the bilateral shoulders. IMPRESSION: No active disease. Electronically Signed   By: Tish Frederickson M.D.   On: 03/07/2023 17:36    Procedures Procedures    Medications Ordered in ED Medications  morphine (PF) 4 MG/ML injection 4 mg (has no administration in time range)  metoCLOPramide (REGLAN) injection 10 mg (10 mg Intravenous Given 03/07/23 1801)    ED Course/ Medical Decision Making/ A&P                                 Medical Decision Making Amount and/or Complexity of Data Reviewed Labs: ordered. Decision-making details documented in ED Course. Radiology: ordered and independent interpretation performed. Decision-making details documented in ED Course. ECG/medicine tests: ordered and independent interpretation performed. Decision-making details documented in ED Course.  Risk Prescription drug management. Decision regarding hospitalization.   Pt with multiple medical problems and comorbidities and presenting today with a complaint that caries a high risk for morbidity and mortality.  Here today as a level 2 fall on thinners.  Patient's fall seem to be related to losing his balance but low suspicion  for syncope, dysrhythmia or cardiac cause of his fall today.  Patient did hit his head and is on Plavix but is mentating normally and denies a headache.  Vital signs are reassuring.  Patient has deformity of the right hip and significant pain and concern for injury.  Otherwise able to move all extremities.  Has a skin tear on the right arm but low suspicion for bony fracture. I have independently visualized and interpreted pt's images today.  Chest x-ray is a poor inspiratory film but no acute findings.  Pelvic film shows a right intertrochanteric femur fracture.  I independently interpreted patient's labs and EKG.  EKG with a sinus rhythm with a right bundle branch block which is unchanged from prior, lactic acid within normal limits, Chem-8 with a creatinine of 1.7 which is baseline from prior reads as well as a hemoglobin stable at 10.9.  Orthopedics consulted and Dr. Ave Filter is aware and will consult on the patient.  Head CT negative for acute findings.  Patient admitted to hospitalist service for ongoing care.  Patient and his family are aware of this plan.          Final Clinical Impression(s) / ED Diagnoses Final diagnoses:  Fall, initial encounter  Closed 2-part intertrochanteric fracture of right femur, initial encounter [facility])    Rx / DC Orders ED Discharge Orders     None         Gwyneth Sprout, MD 03/07/23 [phone]

## 2023-03-07 NOTE — Progress Notes (Signed)
 Chaplain responded to a Level 2 Trauma page, to assist Pt. Chaplain was able to communicate with Pt's daughter and son, who were waiting at the lobby. Chaplain let Pt know family was outside and that will get in as soon as medical team allows it. Pt and Pt's family were grateful for the assistance.    03/07/23 1737  Spiritual Encounters  Type of Visit Initial  Care provided to: Patient;Family  Conversation partners present during encounter Nurse  Referral source Code page;Trauma page  Reason for visit Trauma  OnCall Visit Yes

## 2023-03-07 NOTE — H&P (View-Only) (Signed)
 Reason for Consult:right hip fracture Referring Physician: ED  HARJAS BIGGINS is an 89 y.o. male.  HPI: Patient is an 89 year old male with a history of CAD on Plavix, hypertension, PUD, right bundle branch block, diabetes who is presenting today as a level 2 fall. Since the fall he has had severe pain in his right hip and has been unable to ambulate. EMS reported deformity of the right leg and severe pain. Patient's family in room and help provide history.   Past Medical History:  Diagnosis Date   Bronchitis    Hx of   CAD (coronary artery disease)    (2005 LAD 30-40% stenosis  followed by 60-65% focal stenosis.  The second diagonal had 80%   stenosis.  The circumflex had 85-90% mid stenosis and was stented with a  Taxus stent.  The obtuse marginal had 20-25% stenosis and obtuse   marginal-2 had 40-50% stenosis.  The right coronary artery had 40-50%  proximal stenosis and mid 75% stenosis), well-preserved ejection  fraction (50-55% )   Carotid stenosis    History of colonic polyps    HTN (hypertension)    Myocardial infarction [facility])    Obesity    Prostate cancer (HCC) 2001   Radiation    PUD (peptic ulcer disease)    Radiation proctitis    Right bundle branch block    Sigmoid diverticulosis    Skin cancer    Sleep apnea    Tobacco abuse    Type 2 diabetes mellitus (HCC)     Past Surgical History:  Procedure Laterality Date   CARDIAC CATHETERIZATION  12/13/2003   ptca, stent x 2   CHOLECYSTECTOMY     COLONOSCOPY  05/11/2000   COLONOSCOPY W/ POLYPECTOMY     CYSTOSCOPY W/ RETROGRADES Bilateral 05/13/2018   Procedure: CYSTOSCOPY WITH RETROGRADE PYELOGRAM;  Surgeon: Marcine Matar, MD;  Location: [facility] ORS;  Service: Urology;  Laterality: Bilateral;   HERNIA REPAIR     right inguinal   Left Arm repair  1994   TRANSURETHRAL RESECTION OF BLADDER TUMOR WITH MITOMYCIN-C N/A 05/13/2018   Procedure: TRANSURETHRAL RESECTION OF BLADDER TUMOR WITH MITOMYCIN-C;  Surgeon: Marcine Matar, MD;   Location: [facility] ORS;  Service: Urology;  Laterality: N/A;  30 MNS    Family History  Problem Relation Age of Onset   Cancer Father    Coronary artery disease Father 23   Coronary artery disease Brother 59   Coronary artery disease Sister 23   Stroke Mother 55    Social History:  reports that he has been smoking cigarettes. He has a 60 pack-year smoking history. He has never used smokeless tobacco. He reports that he does not drink alcohol and does not use drugs.  Allergies: No Known Allergies  Medications: I have reviewed the patient's current medications.  Results for orders placed or performed during the hospital encounter of 03/07/23 (from the past 48 hours)  Sample to Blood Bank     Status: None   Collection Time: 03/07/23  5:09 PM  Result Value Ref Range   Blood Bank Specimen SAMPLE AVAILABLE FOR TESTING    Sample Expiration      03/10/2023,2359 Performed at [facility], [address] [place], [place] [address]   Comprehensive metabolic panel     Status: Abnormal   Collection Time: 03/07/23  5:14 PM  Result Value Ref Range   Sodium 140 135 - 145 mmol/L   Potassium 4.2 3.5 - 5.1 mmol/L   Chloride 105  98 - 111 mmol/L   CO2 24 22 - 32 mmol/L   Glucose, Bld 145 (H) 70 - 99 mg/dL    Comment: Glucose reference range applies only to samples taken after fasting for at least 8 hours.   BUN 21 8 - 23 mg/dL   Creatinine, Ser 1.61 (H) 0.61 - 1.24 mg/dL   Calcium 9.0 8.9 - 09.6 mg/dL   Total Protein 6.0 (L) 6.5 - 8.1 g/dL   Albumin 3.0 (L) 3.5 - 5.0 g/dL   AST 16 15 - 41 U/L   ALT 7 0 - 44 U/L   Alkaline Phosphatase 50 38 - 126 U/L   Total Bilirubin 0.8 0.0 - 1.2 mg/dL   GFR, Estimated 42 (L) >60 mL/min    Comment: (NOTE) Calculated using the CKD-EPI Creatinine Equation (2021)    Anion gap 11 5 - 15    Comment: Performed at [facility], [address] [place], [place] [address]  CBC     Status: Abnormal   Collection Time: 03/07/23  5:14 PM  Result Value Ref  Range   WBC 10.7 (H) 4.0 - 10.5 K/uL   RBC 3.54 (L) 4.22 - 5.81 MIL/uL   Hemoglobin 10.5 (L) 13.0 - 17.0 g/dL   HCT 98.1 (L) 19.1 - 47.8 %   MCV 94.9 80.0 - 100.0 fL   MCH 29.7 26.0 - 34.0 pg   MCHC 31.3 30.0 - 36.0 g/dL   RDW 29.5 62.1 - 30.8 %   Platelets 192 150 - 400 K/uL   nRBC 0.0 0.0 - 0.2 %    Comment: Performed at [facility], [address] [place], [place] [address]  Ethanol     Status: None   Collection Time: 03/07/23  5:14 PM  Result Value Ref Range   Alcohol, Ethyl (B) <10 <10 mg/dL    Comment: (NOTE) Lowest detectable limit for serum alcohol is 10 mg/dL.  For medical purposes only. Performed at [facility], [address], [place], [place] [address]   Protime-INR     Status: None   Collection Time: 03/07/23  5:14 PM  Result Value Ref Range   Prothrombin Time 14.3 11.4 - 15.2 seconds   INR 1.1 0.8 - 1.2    Comment: (NOTE) INR goal varies based on device and disease states. Performed at [facility], [address] [place], [place] [address]   I-Stat Chem 8, ED     Status: Abnormal   Collection Time: 03/07/23  5:30 PM  Result Value Ref Range   Sodium 139 135 - 145 mmol/L   Potassium 4.2 3.5 - 5.1 mmol/L   Chloride 106 98 - 111 mmol/L   BUN 28 (H) 8 - 23 mg/dL   Creatinine, Ser 3.24 (H) 0.61 - 1.24 mg/dL   Glucose, Bld 401 (H) 70 - 99 mg/dL    Comment: Glucose reference range applies only to samples taken after fasting for at least 8 hours.   Calcium, Ion 1.14 (L) 1.15 - 1.40 mmol/L   TCO2 25 22 - 32 mmol/L   Hemoglobin 10.9 (L) 13.0 - 17.0 g/dL   HCT 02.7 (L) 25.3 - 66.4 %  I-Stat Lactic Acid, ED     Status: None   Collection Time: 03/07/23  5:31 PM  Result Value Ref Range   Lactic Acid, Venous 1.2 0.5 - 1.9 mmol/L    DG Pelvis Portable Result Date: 03/07/2023 CLINICAL DATA:  Trauma EXAM: PORTABLE PELVIS 1-2 VIEWS  COMPARISON:  X-ray pelvis 10/03/2022 FINDINGS: Limited evaluation due to overlapping osseous structures and overlying  soft tissues. Question poorly visualized acute, likely intertrochanteric, fracture of the right proximal femur. No acute displaced fracture or dislocation of the left hip on frontal view. There is no evidence of pelvic fracture or diastasis. No pelvic bone lesions are seen. Vascular calcification. IMPRESSION: Question poorly visualized acute, likely intertrochanteric, fracture of the right proximal femur. Electronically Signed   By: Tish Frederickson M.D.   On: 03/07/2023 17:38   DG Chest Port 1 View Result Date: 03/07/2023 CLINICAL DATA:  Trauma  trauma level 2, fall, pain on right hip EXAM: PORTABLE CHEST 1 VIEW COMPARISON:  Chest x-ray 10/03/2022 FINDINGS: The heart and mediastinal contours are unchanged. Atherosclerotic plaque. Low lung volumes. No focal consolidation. Chronic coarsened interstitial markings with no overt pulmonary edema. No pleural effusion. No pneumothorax. No acute osseous abnormality. Degenerative changes of the bilateral shoulders. IMPRESSION: No active disease. Electronically Signed   By: Tish Frederickson M.D.   On: 03/07/2023 17:36    Review of Systems  Constitutional: Negative.  Negative for chills and fever.  Respiratory:  Positive for shortness of breath. Negative for cough.   Cardiovascular:  Negative for chest pain.  Gastrointestinal:  Negative for abdominal pain.  Musculoskeletal:  Positive for arthralgias.  Neurological:  Negative for dizziness.  Psychiatric/Behavioral:  Negative for agitation.    Blood pressure 116/60, pulse 80, temperature (!) 97.5 F (36.4 C), temperature source Oral, resp. rate 14, SpO2 (!) 88%. Physical Exam Constitutional:      General: He is not in acute distress.    Appearance: Normal appearance. He is normal weight.  HENT:     Head: Normocephalic and atraumatic.  Eyes:     Extraocular Movements: Extraocular movements intact.  Cardiovascular:     Rate and Rhythm: Normal rate.     Pulses: Normal pulses.  Pulmonary:     Effort:  Pulmonary effort is normal.  Musculoskeletal:     Comments: Patient tender to palpation about the right hip. No open wounds or abrasions about the RLE. RLE externally rotated. Calves soft and nontender. Distally neurovascularly intact. Able to dorsiflex and plantar flex BLE.   Neurological:     General: No focal deficit present.     Mental Status: He is alert. Mental status is at baseline.  Psychiatric:        Mood and Affect: Mood normal.        Behavior: Behavior normal.     Assessment/Plan: Right intertrochanteric hip fracture Plan for right hip IM nail tomorrow. Patient to be NPO after midnight but may eat today. Hold Plavix but will resume day after surgery likely. Medicine will admit patient. NWB at this time. Discussed the case with the patient and his daughter including expected postoperative course, risks, and benefits. Dr Ave Filter posted case for tomorrow about 1030am.   Takima Encina L. [place], PA-C 03/07/2023, 6:30 PM

## 2023-03-08 ENCOUNTER — Inpatient Hospital Stay (HOSPITAL_COMMUNITY)

## 2023-03-08 ENCOUNTER — Inpatient Hospital Stay (HOSPITAL_COMMUNITY): Admitting: Registered Nurse

## 2023-03-08 ENCOUNTER — Other Ambulatory Visit: Payer: Self-pay

## 2023-03-08 ENCOUNTER — Encounter (HOSPITAL_COMMUNITY): Payer: Self-pay | Admitting: Internal Medicine

## 2023-03-08 ENCOUNTER — Encounter (HOSPITAL_COMMUNITY)
Admission: EM | Disposition: A | Discharge status: Skilled Nursing Facility | Payer: Self-pay | Source: Skilled Nursing Facility | Attending: Internal Medicine

## 2023-03-08 DIAGNOSIS — S72141P Displaced intertrochanteric fracture of right femur, subsequent encounter for closed fracture with malunion: Secondary | ICD-10-CM | POA: Diagnosis not present

## 2023-03-08 DIAGNOSIS — I10 Essential (primary) hypertension: Secondary | ICD-10-CM

## 2023-03-08 DIAGNOSIS — J449 Chronic obstructive pulmonary disease, unspecified: Secondary | ICD-10-CM

## 2023-03-08 DIAGNOSIS — I2510 Atherosclerotic heart disease of native coronary artery without angina pectoris: Secondary | ICD-10-CM

## 2023-03-08 DIAGNOSIS — S72001A Fracture of unspecified part of neck of right femur, initial encounter for closed fracture: Secondary | ICD-10-CM

## 2023-03-08 DIAGNOSIS — E785 Hyperlipidemia, unspecified: Secondary | ICD-10-CM

## 2023-03-08 DIAGNOSIS — I4581 Long QT syndrome: Secondary | ICD-10-CM

## 2023-03-08 DIAGNOSIS — D649 Anemia, unspecified: Secondary | ICD-10-CM | POA: Diagnosis not present

## 2023-03-08 HISTORY — PX: INTRAMEDULLARY (IM) NAIL INTERTROCHANTERIC: SHX5875

## 2023-03-08 LAB — COMPREHENSIVE METABOLIC PANEL
ALT: 10 U/L (ref 0–44)
AST: 15 U/L (ref 15–41)
Albumin: 2.8 g/dL — ABNORMAL LOW (ref 3.5–5.0)
Alkaline Phosphatase: 48 U/L (ref 38–126)
Anion gap: 13 (ref 5–15)
BUN: 22 mg/dL (ref 8–23)
CO2: 24 mmol/L (ref 22–32)
Calcium: 9.2 mg/dL (ref 8.9–10.3)
Chloride: 102 mmol/L (ref 98–111)
Creatinine, Ser: 1.56 mg/dL — ABNORMAL HIGH (ref 0.61–1.24)
GFR, Estimated: 42 mL/min — ABNORMAL LOW (ref >60)
Glucose, Bld: 134 mg/dL — ABNORMAL HIGH (ref 70–99)
Potassium: 4.6 mmol/L (ref 3.5–5.1)
Sodium: 139 mmol/L (ref 135–145)
Total Bilirubin: 0.8 mg/dL (ref 0.0–1.2)
Total Protein: 5.8 g/dL — ABNORMAL LOW (ref 6.5–8.1)

## 2023-03-08 LAB — CBC
HCT: 33.2 % — ABNORMAL LOW (ref 39.0–52.0)
Hemoglobin: 10.4 g/dL — ABNORMAL LOW (ref 13.0–17.0)
MCH: 29.6 pg (ref 26.0–34.0)
MCHC: 31.3 g/dL (ref 30.0–36.0)
MCV: 94.6 fL (ref 80.0–100.0)
Platelets: 195 10*3/uL (ref 150–400)
RBC: 3.51 MIL/uL — ABNORMAL LOW (ref 4.22–5.81)
RDW: 13 % (ref 11.5–15.5)
WBC: 12.2 10*3/uL — ABNORMAL HIGH (ref 4.0–10.5)
nRBC: 0 % (ref 0.0–0.2)

## 2023-03-08 LAB — GLUCOSE, CAPILLARY
Glucose-Capillary: 115 mg/dL — ABNORMAL HIGH (ref 70–99)
Glucose-Capillary: 136 mg/dL — ABNORMAL HIGH (ref 70–99)
Glucose-Capillary: 143 mg/dL — ABNORMAL HIGH (ref 70–99)
Glucose-Capillary: 249 mg/dL — ABNORMAL HIGH (ref 70–99)

## 2023-03-08 LAB — MAGNESIUM: Magnesium: 1.3 mg/dL — ABNORMAL LOW (ref 1.7–2.4)

## 2023-03-08 LAB — SURGICAL PCR SCREEN
MRSA, PCR: NEGATIVE
Staphylococcus aureus: NEGATIVE

## 2023-03-08 SURGERY — FIXATION, FRACTURE, INTERTROCHANTERIC, WITH INTRAMEDULLARY ROD
Anesthesia: General | Laterality: Right

## 2023-03-08 MED ORDER — PHENYLEPHRINE HCL-NACL 20-0.9 MG/250ML-% IV SOLN
INTRAVENOUS | Status: DC | PRN
  Administered 2023-03-08: 50 ug/min via INTRAVENOUS

## 2023-03-08 MED ORDER — PROPOFOL 10 MG/ML IV BOLUS
INTRAVENOUS | Status: AC
  Filled 2023-03-08: qty 20

## 2023-03-08 MED ORDER — CEFAZOLIN SODIUM-DEXTROSE 2-4 GM/100ML-% IV SOLN
2.0000 g | INTRAVENOUS | Status: AC
  Administered 2023-03-08: 2 g via INTRAVENOUS

## 2023-03-08 MED ORDER — PROPOFOL 10 MG/ML IV BOLUS
INTRAVENOUS | Status: DC | PRN
  Administered 2023-03-08: 70 mg via INTRAVENOUS
  Administered 2023-03-08: 120 ug/kg/min via INTRAVENOUS

## 2023-03-08 MED ORDER — MENTHOL 3 MG MT LOZG: 1.0000 | LOZENGE | OROMUCOSAL | Status: DC | PRN

## 2023-03-08 MED ORDER — SUGAMMADEX SODIUM 200 MG/2ML IV SOLN
INTRAVENOUS | Status: DC | PRN
Start: 2023-03-08 — End: 2023-03-08
  Administered 2023-03-08: 200 mg via INTRAVENOUS

## 2023-03-08 MED ORDER — TRANEXAMIC ACID-NACL 1000-0.7 MG/100ML-% IV SOLN
INTRAVENOUS | Status: DC | PRN
  Administered 2023-03-08: 1000 mg via INTRAVENOUS

## 2023-03-08 MED ORDER — METOCLOPRAMIDE HCL 5 MG PO TABS: 5.0000 mg | ORAL_TABLET | Freq: Three times a day (TID) | ORAL | Status: DC | PRN

## 2023-03-08 MED ORDER — INSULIN ASPART 100 UNIT/ML IJ SOLN: 0.0000 [IU] | INTRAMUSCULAR | Status: DC | PRN

## 2023-03-08 MED ORDER — CEFAZOLIN SODIUM-DEXTROSE 2-4 GM/100ML-% IV SOLN
INTRAVENOUS | Status: AC
  Filled 2023-03-08: qty 100

## 2023-03-08 MED ORDER — MOMETASONE FURO-FORMOTEROL FUM 200-5 MCG/ACT IN AERO
2.0000 | INHALATION_SPRAY | Freq: Two times a day (BID) | RESPIRATORY_TRACT | Status: DC
  Administered 2023-03-08 – 2023-03-10 (×4): 2 via RESPIRATORY_TRACT
  Filled 2023-03-08: qty 8.8

## 2023-03-08 MED ORDER — PANTOPRAZOLE SODIUM 40 MG PO TBEC
40.0000 mg | DELAYED_RELEASE_TABLET | Freq: Every day | ORAL | Status: DC
  Administered 2023-03-08 – 2023-03-10 (×3): 40 mg via ORAL
  Filled 2023-03-08 (×3): qty 1

## 2023-03-08 MED ORDER — LOPERAMIDE HCL 2 MG PO CAPS
2.0000 mg | ORAL_CAPSULE | Freq: Every day | ORAL | Status: DC
  Administered 2023-03-08 – 2023-03-09 (×2): 2 mg via ORAL
  Filled 2023-03-08 (×2): qty 1

## 2023-03-08 MED ORDER — UMECLIDINIUM BROMIDE 62.5 MCG/ACT IN AEPB
1.0000 | INHALATION_SPRAY | Freq: Every day | RESPIRATORY_TRACT | Status: DC
  Administered 2023-03-09 – 2023-03-10 (×2): 1 via RESPIRATORY_TRACT
  Filled 2023-03-08: qty 7

## 2023-03-08 MED ORDER — CHLORHEXIDINE GLUCONATE 4 % EX SOLN
60.0000 mL | Freq: Once | CUTANEOUS | Status: DC
  Administered 2023-03-08: 4 via TOPICAL

## 2023-03-08 MED ORDER — FENTANYL CITRATE (PF) 100 MCG/2ML IJ SOLN
INTRAMUSCULAR | Status: AC
Start: 2023-03-08 — End: 2023-03-08
  Filled 2023-03-08: qty 2

## 2023-03-08 MED ORDER — ACETAMINOPHEN 10 MG/ML IV SOLN
INTRAVENOUS | Status: AC
  Filled 2023-03-08: qty 100

## 2023-03-08 MED ORDER — DEXAMETHASONE SODIUM PHOSPHATE 10 MG/ML IJ SOLN
INTRAMUSCULAR | Status: DC | PRN
  Administered 2023-03-08: 5 mg via INTRAVENOUS

## 2023-03-08 MED ORDER — ACETAMINOPHEN 10 MG/ML IV SOLN
INTRAVENOUS | Status: DC | PRN
  Administered 2023-03-08: 1000 mg via INTRAVENOUS

## 2023-03-08 MED ORDER — ORAL CARE MOUTH RINSE: 15.0000 mL | Freq: Once | OROMUCOSAL | Status: AC

## 2023-03-08 MED ORDER — DOCUSATE SODIUM 100 MG PO CAPS
100.0000 mg | ORAL_CAPSULE | Freq: Two times a day (BID) | ORAL | Status: DC
  Administered 2023-03-08 – 2023-03-10 (×4): 100 mg via ORAL
  Filled 2023-03-08 (×2): qty 1

## 2023-03-08 MED ORDER — DICLOFENAC SODIUM 1 % EX GEL: 2.0000 g | Freq: Three times a day (TID) | CUTANEOUS | Status: DC | PRN

## 2023-03-08 MED ORDER — OXYCODONE HCL 5 MG PO TABS: 5.0000 mg | ORAL_TABLET | Freq: Four times a day (QID) | ORAL | Status: DC | PRN

## 2023-03-08 MED ORDER — PHENOL 1.4 % MT LIQD: 1.0000 | OROMUCOSAL | Status: DC | PRN

## 2023-03-08 MED ORDER — AMLODIPINE BESYLATE 5 MG PO TABS
5.0000 mg | ORAL_TABLET | Freq: Every day | ORAL | Status: DC
  Administered 2023-03-08 – 2023-03-10 (×3): 5 mg via ORAL
  Filled 2023-03-08 (×3): qty 1

## 2023-03-08 MED ORDER — LIDOCAINE 2% (20 MG/ML) 5 ML SYRINGE
INTRAMUSCULAR | Status: DC | PRN
  Administered 2023-03-08: 40 mg via INTRAVENOUS

## 2023-03-08 MED ORDER — DEXAMETHASONE SODIUM PHOSPHATE 10 MG/ML IJ SOLN: INTRAMUSCULAR | Status: DC | PRN

## 2023-03-08 MED ORDER — EPHEDRINE SULFATE-NACL 50-0.9 MG/10ML-% IV SOSY
PREFILLED_SYRINGE | INTRAVENOUS | Status: DC | PRN
  Administered 2023-03-08: 5 mg via INTRAVENOUS

## 2023-03-08 MED ORDER — CHLORHEXIDINE GLUCONATE 0.12 % MT SOLN: 15.0000 mL | Freq: Once | OROMUCOSAL | Status: AC

## 2023-03-08 MED ORDER — IRBESARTAN 300 MG PO TABS: 300.0000 mg | ORAL_TABLET | Freq: Every day | ORAL | Status: DC

## 2023-03-08 MED ORDER — LACTATED RINGERS IV SOLN: INTRAVENOUS | Status: DC

## 2023-03-08 MED ORDER — METOCLOPRAMIDE HCL 5 MG/ML IJ SOLN: 5.0000 mg | Freq: Three times a day (TID) | INTRAMUSCULAR | Status: DC | PRN

## 2023-03-08 MED ORDER — MAGNESIUM SULFATE 4 GM/100ML IV SOLN
4.0000 g | Freq: Once | INTRAVENOUS | Status: AC
  Administered 2023-03-08: 4 g via INTRAVENOUS
  Filled 2023-03-08: qty 100

## 2023-03-08 MED ORDER — CHOLESTYRAMINE 4 G PO PACK
4.0000 g | PACK | Freq: Every day | ORAL | Status: DC
  Administered 2023-03-08 – 2023-03-10 (×3): 4 g via ORAL
  Filled 2023-03-08 (×3): qty 1

## 2023-03-08 MED ORDER — POVIDONE-IODINE 10 % EX SWAB
2.0000 | Freq: Once | CUTANEOUS | Status: AC
  Administered 2023-03-08: 2 via TOPICAL

## 2023-03-08 MED ORDER — ROCURONIUM BROMIDE 10 MG/ML (PF) SYRINGE
PREFILLED_SYRINGE | INTRAVENOUS | Status: DC | PRN
  Administered 2023-03-08: 30 mg via INTRAVENOUS

## 2023-03-08 MED ORDER — ESCITALOPRAM OXALATE 10 MG PO TABS: 10.0000 mg | ORAL_TABLET | Freq: Every day | ORAL | Status: DC

## 2023-03-08 MED ORDER — CHLORHEXIDINE GLUCONATE 0.12 % MT SOLN
OROMUCOSAL | Status: AC
  Administered 2023-03-08: 15 mL via OROMUCOSAL
  Filled 2023-03-08: qty 15

## 2023-03-08 MED ORDER — FENTANYL CITRATE (PF) 250 MCG/5ML IJ SOLN
INTRAMUSCULAR | Status: DC | PRN
  Administered 2023-03-08 (×2): 25 ug via INTRAVENOUS

## 2023-03-08 MED ORDER — ONDANSETRON HCL 4 MG/2ML IJ SOLN
INTRAMUSCULAR | Status: DC | PRN
  Administered 2023-03-08: 4 mg via INTRAVENOUS

## 2023-03-08 SURGICAL SUPPLY — 26 items
BIT DRILL CANN LG 4.3MM (BIT) IMPLANT
CHLORAPREP W/TINT 26 (MISCELLANEOUS) ×1 IMPLANT
COVER PERINEAL POST (MISCELLANEOUS) ×1 IMPLANT
COVER SURGICAL LIGHT HANDLE (MISCELLANEOUS) ×1 IMPLANT
DRAPE STERI IOBAN 125X83 (DRAPES) ×1 IMPLANT
DRESSING MEPILEX FLEX 4X4 (GAUZE/BANDAGES/DRESSINGS) ×3 IMPLANT
DRILL BIT CANN LG 4.3MM (BIT) ×1 IMPLANT
DRSG MEPILEX FLEX 4X4 (GAUZE/BANDAGES/DRESSINGS) ×1 IMPLANT
DRSG MEPILEX POST OP 4X8 (GAUZE/BANDAGES/DRESSINGS) IMPLANT
GLOVE BIO SURGEON STRL SZ7.5 (GLOVE) ×1 IMPLANT
GLOVE BIOGEL PI IND STRL 8 (GLOVE) ×1 IMPLANT
GOWN STRL REUS W/ TWL LRG LVL3 (GOWN DISPOSABLE) ×2 IMPLANT
GUIDEPIN VERSANAIL DSP 3.2X444 (ORTHOPEDIC DISPOSABLE SUPPLIES) IMPLANT
HFN LAG SCREW 10.5MM X 115MM (Orthopedic Implant) IMPLANT
KIT TURNOVER KIT B (KITS) ×1 IMPLANT
NAIL HIP FRACT 130D 11X180 (Screw) IMPLANT
NS IRRIG 1000ML POUR BTL (IV SOLUTION) ×1 IMPLANT
PACK GENERAL/GYN (CUSTOM PROCEDURE TRAY) ×1 IMPLANT
PAD ARMBOARD 7.5X6 YLW CONV (MISCELLANEOUS) ×2 IMPLANT
SCREW BONE CORTICAL 5.0X3 (Screw) IMPLANT
STAPLER VISISTAT 35W (STAPLE) ×1 IMPLANT
SUT VIC AB 0 CT1 27XBRD ANBCTR (SUTURE) IMPLANT
SUT VIC AB 2-0 CT1 TAPERPNT 27 (SUTURE) ×1 IMPLANT
TOWEL GREEN STERILE (TOWEL DISPOSABLE) ×1 IMPLANT
TOWEL GREEN STERILE FF (TOWEL DISPOSABLE) ×1 IMPLANT
WATER STERILE IRR 1000ML POUR (IV SOLUTION) ×1 IMPLANT

## 2023-03-08 NOTE — TOC CAGE-AID Note (Signed)
 Transition of Care [facility]) - CAGE-AID Screening   Patient Details  Name: [name] [name] MRN: [id number] Date of Birth: 04-26-1932  Transition of Care [facility] CM/SW Contact:    Janora Norlander, RN Phone Number: [phone] 03/08/2023, 12:25 PM   Clinical Narrative: Pt denies use of illicit drugs and/or alcohol use.  Screening complete.   CAGE-AID Screening:    Have You Ever Felt You Ought to Cut Down on Your Drinking or Drug Use?: No Have People Annoyed You By Critizing Your Drinking Or Drug Use?: No Have You Felt Bad Or Guilty About Your Drinking Or Drug Use?: No Have You Ever Had a Drink or Used Drugs First Thing In The Morning to Steady Your Nerves or to Get Rid of a Hangover?: No CAGE-AID Score: 0  Substance Abuse Education Offered: No

## 2023-03-08 NOTE — ED Notes (Signed)
 Skin tear on R upper arm dressed and wrapped at this time

## 2023-03-08 NOTE — Op Note (Signed)
 Procedure(s): INTRAMEDULLARY (IM) NAIL INTERTROCHANTERIC Procedure Note  [name] [name] male 89 y.o. 03/08/2023  Preoperative diagnosis: Right hip intertrochanteric proximal femur fracture  Postoperative diagnosis: Same  Procedure(s) and Anesthesia Type:    * INTRAMEDULLARY (IM) NAIL INTERTROCHANTERIC - General  Surgeons and Role:    Jones Broom, MD - Primary   Indications:  89 y.o. male s/p fall with right hip fracture. Indicated for surgery to promote early ambulation, pain control and prevent complications of bed rest.     Surgeon: Glennon Hamilton   Assistants: Fredia Sorrow PA-C Amber was present and scrubbed throughout the procedure and was essential in positioning, retraction, exposure, and closure)  Anesthesia: General endotracheal anesthesia   Procedure Detail  INTRAMEDULLARY (IM) NAIL INTERTROCHANTERIC  Findings: Anatomic reduction with Biomet short affixes nail  Estimated Blood Loss:  200 mL         Drains: none  Blood Given: none          Specimens: none        Complications:  * No complications entered in OR log *         Disposition: PACU - hemodynamically stable.         Condition: stable    Procedure:  The patient was identified in the preoperative holding area  where I personally marked the operative site after verifying site, side,  and procedure with the patient. he was taken back to the operating  room where general anesthesia was induced without complication. She was  placed on the fracture table with the right lower extremity in traction,  and opposite lower extremity in a flexed abducted position. The arms were well  padded. Fluoroscopic imaging was used to verify reduction with gentle traction  and internal rotation. The right hip was then prepped and draped in the standard sterile fashion. An approximately 3 cm incision was made proximal  to the palpable greater trochanter tip. Dissection was carried down to  the tip and the  short guidewire was placed under fluoroscopic imaging.  The proximal entry reamer was used to open the canal and the nail was then  advanced without difficulty and the proximal jig was  then placed and a small 1.5 cm incision was made on the lateral thigh to  advance the lag screw guide against the lateral aspect of the femur.  The guide pin was advanced and its position was verified in AP and  lateral planes to be centered in the head.  The guidewire was over  reamed and the appropriate size lag screw was advanced. The  proximal set screw was then advanced, backed off a quarter turn to allow  sliding. AP and lateral imaging demonstrated appropriate position of  the screw and reduction of the fracture. The proximal jig was then  Used to place 1 distal static interlocking screw. Final fluoroscopic imaging in AP and lateral planes at the hip and the  knee demonstrated near anatomic reduction with appropriate length and  position of the hardware. All wounds were then copiously irrigated with  normal saline and subsequently closed in layers with #1 Vicryl in a deep  fascia layer, 2-0 Vicryl in a deep dermal layer, and staples for skin  closure. Sterile dressings were then applied including 4x4s and Mepilex  dressings. The patient was then taken off the fracture table,  transferred to the stretcher, and taken to the recovery room in stable  condition after she was extubated.   POSTOPERATIVE PLAN: he will be weightbearing  as tolerated on the operative extremity.  He will resume preoperative anticoagulant which should cover DVT prophylaxis.

## 2023-03-08 NOTE — Progress Notes (Signed)
 PROGRESS NOTE    [name] [name]  FAO:[id number] DOB: 22-Jun-1932 DOA: 03/07/2023 PCP: Chilton Greathouse, MD    Chief Complaint  Patient presents with   Fall    Brief Narrative:  Patient is a 89 year old gentleman history of TIA, type 2 diabetes, CKD stage IIIb, hyperlipidemia, hypertension, chronic hypoxic respiratory failure on 2 L nasal cannula at baseline, COPD, CAD, carotid stenosis GERD, history of second-degree AV block, pulmonary nodule and prolonged QT presented to the ED after mechanical fall while she was hanging up his skull and head on the back of the door patient noted to have lost his balance and fell backwards on the floor.  Head CT done unremarkable.  Plain films of the right hip and pelvis done with an acute intertrochanteric right proximal femoral fracture.  Orthopedics consulted and patient for femoral fracture repair today 03/08/2023.   Assessment & Plan:   Principal Problem:   Intertrochanteric fracture of right femur (HCC) Active Problems:   Non-insulin dependent type 2 diabetes mellitus (HCC)   Hyperlipidemia   Essential hypertension   Acute on chronic anemia   COPD (chronic obstructive pulmonary disease) (HCC)   Bilateral carotid artery stenosis   GERD (gastroesophageal reflux disease)   Second degree AV block   TIA (transient ischemic attack)   CKD stage 3b, GFR 30-44 ml/min (HCC)   Long Q-T syndrome   History of CAD (coronary artery disease)   GAD (generalized anxiety disorder)   Chronic hypoxic respiratory failure (HCC)  #1 acute right femoral intertrochanteric fracture -Secondary to mechanical fall. -Patient presented with complaints of right-sided hip pain. -CT head negative for any acute abnormalities. -Plain films of the right hip and pelvis with a intertrochanteric right proximal femoral fracture. -Patient seen in consultation by orthopedics and patient for right hip IM nail today 03/08/2023. -Continue to hold Plavix. -Continue current pain management  with Tylenol, Roxicodone, morphine as needed. -Pain management per orthopedics. -Will likely need PT OT consult postsurgery. -Per orthopedics.  2.  Second-degree AV block/history of QT prolongation -EKG done with ectopic atrial rhythm, sinus pause, prolonged PR interval heart rate of 72. -Keep electrolytes repleted potassium approximately 4, magnesium approximately 2. -Avoid QT prolongation medications.  3.  Acute on chronic anemia -Hemoglobin noted on admission at 10.5 with baseline 10-14. -Check an anemia panel. -Follow H&H. -Transfusion threshold hemoglobin < 8.  4.  Hypertension -BP somewhat elevated with systolic in the 160s. -Resume home regimen Norvasc 5 mg daily. -Continue to hold home regimen ARB.  5.  Non insulin-dependent type 2 diabetes -Hemoglobin A1c noted at 5.9 (09/24/2022) -Patient not on diabetic medications on med rec. -Check a hemoglobin A1c. -Follow.  6.  Chronic respiratory failure on 2 L home O2 at baseline/history of COPD -Stable. -Keep sats between 88 and 93%. -DuoNebs as needed. -Place on Incruse Ellipta, Dulera.  7.  Bilateral carotid artery stenosis/CAD/TIA -Continue to hold Plavix for surgical intervention and resume 1 day postsurgery per orthopedic recommendations. -Continue Crestor.  8.  CKD stage IIIb -Stable.  9.  General Anxiety disorder -Continue to hold lexapro as patient with secondary AV block and prolonged QTc.   DVT prophylaxis: Postop DVT prophylaxis per Orthopedics. Code Status: Full Family Communication: Updated patient, daughters and son at bedside. Disposition: TBD  Status is: Inpatient Remains inpatient appropriate because: Severity of illness   Consultants:  Orthopedics: Dr. Ave Filter 03/07/2023  Procedures:  CT head 03/07/2023 Chest x-ray 03/07/2023 Plain films of the pelvis 03/07/2023 Plain films of the right hip and pelvis  03/07/2023  Antimicrobials:  Anti-infectives (From admission, onward)    Start     Dose/Rate  Route Frequency Ordered Stop   03/08/23 1007  ceFAZolin (ANCEF) 2-4 GM/100ML-% IVPB       Note to Pharmacy: Aquilla Hacker M: cabinet override      03/08/23 1007 03/08/23 2214   03/08/23 0900  ceFAZolin (ANCEF) IVPB 2g/100 mL premix        2 g 200 mL/hr over 30 Minutes Intravenous On call to O.R. 03/08/23 0815 03/09/23 0559         Subjective: Patient laying in bed, getting ready to go to the OR.  Denies any chest pain or shortness of breath.  Denies any abdominal pain.  Denies any right hip pain.  Family at bedside.  Objective: Vitals:   03/08/23 0700 03/08/23 0714 03/08/23 0830 03/08/23 1000  BP: (!) 162/83  (!) 166/61   Pulse: 66  68 73  Resp: 20   20  Temp:  99 F (37.2 C) 97.6 F (36.4 C) 98 F (36.7 C)  TempSrc:  Oral Oral Oral  SpO2: 96%  91% 92%   No intake or output data in the 24 hours ending 03/08/23 1014 There were no vitals filed for this visit.  Examination:  General exam: Appears calm and comfortable.  Dry mucous membranes. Respiratory system: Clear to auscultation anterior lung fields.Marland Kitchen Respiratory effort normal. Cardiovascular system: S1 & S2 heard, RRR. No JVD, murmurs, rubs, gallops or clicks. No pedal edema. Gastrointestinal system: Abdomen is nondistended, soft and nontender. No organomegaly or masses felt. Normal bowel sounds heard. Central nervous system: Alert and oriented. No focal neurological deficits. Extremities: Right lower extremity externally rotated and shortened.  Skin: No rashes, lesions or ulcers Psychiatry: Judgement and insight appear normal. Mood & affect appropriate.     Data Reviewed: I have personally reviewed following labs and imaging studies  CBC: Recent Labs  Lab 03/07/23 1714 03/07/23 1730 03/08/23 0500  WBC 10.7*  --  12.2*  HGB 10.5* 10.9* 10.4*  HCT 33.6* 32.0* 33.2*  MCV 94.9  --  94.6  PLT 192  --  195    Basic Metabolic Panel: Recent Labs  Lab 03/07/23 1714 03/07/23 1730 03/08/23 0500  NA 140 139 139   K 4.2 4.2 4.6  CL 105 106 102  CO2 24  --  24  GLUCOSE 145* 142* 134*  BUN 21 28* 22  CREATININE 1.58* 1.70* 1.56*  CALCIUM 9.0  --  9.2  MG  --   --  1.3*    GFR: CrCl cannot be calculated (Unknown ideal weight.).  Liver Function Tests: Recent Labs  Lab 03/07/23 1714 03/08/23 0500  AST 16 15  ALT 7 10  ALKPHOS 50 48  BILITOT 0.8 0.8  PROT 6.0* 5.8*  ALBUMIN 3.0* 2.8*    CBG: Recent Labs  Lab 03/07/23 2310 03/08/23 0941  GLUCAP 147* 115*     No results found for this or any previous visit (from the past 240 hours).       Radiology Studies: DG Hip Unilat W or Wo Pelvis 2-3 Views Right Result Date: 03/07/2023 CLINICAL DATA:  Pain and deformity after fall EXAM: DG HIP (WITH OR WITHOUT PELVIS) 2-3V RIGHT COMPARISON:  09/24/2022 FINDINGS: Acute, mildly displaced right intertrochanteric femur fracture. Extensive Atherosclerotic changes seen throughout visualized arterial segments. IMPRESSION: Acute, mildly displaced right intertrochanteric femur fracture. Electronically Signed   By: Acquanetta Belling M.D.   On: 03/07/2023 19:36   CT Head  Wo Contrast Result Date: 03/07/2023 CLINICAL DATA:  Head trauma, minor (Age >= 65y) EXAM: CT HEAD WITHOUT CONTRAST TECHNIQUE: Contiguous axial images were obtained from the base of the skull through the vertex without intravenous contrast. RADIATION DOSE REDUCTION: This exam was performed according to the departmental dose-optimization program which includes automated exposure control, adjustment of the mA and/or kV according to patient size and/or use of iterative reconstruction technique. COMPARISON:  Head CT and brain MRI 09/26/2022 FINDINGS: Brain: No intracranial hemorrhage, mass effect, or midline shift. Stable atrophy. No hydrocephalus. The basilar cisterns are patent. Stable advanced chronic small vessel ischemia. Remote lacunar infarcts in the basal ganglia. No evidence of territorial infarct or acute ischemia. No extra-axial or  intracranial fluid collection. Vascular: Atherosclerosis of skullbase vasculature without hyperdense vessel or abnormal calcification. Skull: No fracture or focal lesion. Sinuses/Orbits: Chronic mucosal thickening throughout the ethmoid air cells. There is increased mucosal thickening of both maxillary sinuses, no fluid levels. Scattered opacification of the mastoid air cells. Left cataract resection. Other: No confluent scalp hematoma. IMPRESSION: 1. No acute intracranial abnormality. No skull fracture. 2. Stable atrophy and advanced chronic small vessel ischemia. 3. Increased mucosal thickening of both maxillary sinuses. Electronically Signed   By: Narda Rutherford M.D.   On: 03/07/2023 18:45   DG Pelvis Portable Result Date: 03/07/2023 CLINICAL DATA:  Trauma EXAM: PORTABLE PELVIS 1-2 VIEWS COMPARISON:  X-ray pelvis 10/03/2022 FINDINGS: Limited evaluation due to overlapping osseous structures and overlying soft tissues. Question poorly visualized acute, likely intertrochanteric, fracture of the right proximal femur. No acute displaced fracture or dislocation of the left hip on frontal view. There is no evidence of pelvic fracture or diastasis. No pelvic bone lesions are seen. Vascular calcification. IMPRESSION: Question poorly visualized acute, likely intertrochanteric, fracture of the right proximal femur. Electronically Signed   By: Tish Frederickson M.D.   On: 03/07/2023 17:38   DG Chest Port 1 View Result Date: 03/07/2023 CLINICAL DATA:  Trauma  trauma level 2, fall, pain on right hip EXAM: PORTABLE CHEST 1 VIEW COMPARISON:  Chest x-ray 10/03/2022 FINDINGS: The heart and mediastinal contours are unchanged. Atherosclerotic plaque. Low lung volumes. No focal consolidation. Chronic coarsened interstitial markings with no overt pulmonary edema. No pleural effusion. No pneumothorax. No acute osseous abnormality. Degenerative changes of the bilateral shoulders. IMPRESSION: No active disease. Electronically Signed    By: Tish Frederickson M.D.   On: 03/07/2023 17:36        Scheduled Meds:  [MAR Hold] amLODipine  5 mg Oral Daily   chlorhexidine  60 mL Topical Once   chlorhexidine  15 mL Mouth/Throat Once   Or   mouth rinse  15 mL Mouth Rinse Once   chlorhexidine       [MAR Hold] cholestyramine  4 g Oral Daily   [MAR Hold] docusate sodium  100 mg Oral BID   [MAR Hold] ferrous sulfate  325 mg Oral Q breakfast   [MAR Hold] loperamide  2 mg Oral QHS   [MAR Hold] melatonin  5 mg Oral QHS   mometasone-formoterol  2 puff Inhalation BID   [MAR Hold] pantoprazole  40 mg Oral Q0600   povidone-iodine  2 Application Topical Once   [MAR Hold] rosuvastatin  5 mg Oral QHS   [MAR Hold] sodium chloride flush  3 mL Intravenous Q12H   umeclidinium bromide  1 puff Inhalation Daily   Continuous Infusions:  [MAR Hold] sodium chloride     ceFAZolin      ceFAZolin (ANCEF) IV  lactated ringers 75 mL/hr at 03/08/23 0101   lactated ringers       LOS: 1 day    Time spent: 35 minutes    Ramiro Harvest, MD [facility]   To contact the attending provider between 7A-7P or the covering provider during after hours 7P-7A, please log into the web site [email or web address] and access using universal [place] password for that web site. If you do not have the password, please call the hospital operator.  03/08/2023, 10:14 AM

## 2023-03-08 NOTE — Plan of Care (Signed)
  Problem: Education: Goal: Knowledge of General Education information will improve Description: Including pain rating scale, medication(s)/side effects and non-pharmacologic comfort measures Outcome: Progressing   Problem: Nutrition: Goal: Adequate nutrition will be maintained Outcome: Progressing   Problem: Pain Managment: Goal: General experience of comfort will improve and/or be controlled Outcome: Progressing   Problem: Skin Integrity: Goal: Risk for impaired skin integrity will decrease Outcome: Progressing

## 2023-03-08 NOTE — Interval H&P Note (Signed)
 History and Physical Interval Note:  03/08/2023 10:03 AM  [name] [name]  has presented today for surgery, with the diagnosis of Right Hip Fracture.  The various methods of treatment have been discussed with the patient and family. After consideration of risks, benefits and other options for treatment, the patient has consented to  Procedure(s): INTRAMEDULLARY (IM) NAIL INTERTROCHANTERIC (Right) as a surgical intervention.  The patient's history has been reviewed, patient examined, no change in status, stable for surgery.  I have reviewed the patient's chart and labs.  Questions were answered to the patient's satisfaction.     Glennon Hamilton

## 2023-03-08 NOTE — Anesthesia Preprocedure Evaluation (Addendum)
 Anesthesia Evaluation  Patient identified by MRN, date of birth, ID band Patient awake    Reviewed: Allergy & Precautions, NPO status , Patient's Chart, lab work & pertinent test results  Airway Mallampati: II       Dental  (+) Edentulous Upper, Dental Advisory Given   Pulmonary sleep apnea , COPD, Current Smoker and Patient abstained from smoking.    + decreased breath sounds      Cardiovascular hypertension, Pt. on medications + CAD and + Past MI  + dysrhythmias  Rhythm:Regular Rate:Normal  Echo:  1. Left ventricular ejection fraction, by estimation, is 55 to 60%. The  left ventricle has normal function. The left ventricle has no regional  wall motion abnormalities. Left ventricular diastolic parameters are  consistent with Grade I diastolic  dysfunction (impaired relaxation).   2. Right ventricular systolic function is normal. The right ventricular  size is normal. Tricuspid regurgitation signal is inadequate for assessing  PA pressure.   3. Left atrial size was moderately dilated.   4. The mitral valve is normal in structure. No evidence of mitral valve  regurgitation. No evidence of mitral stenosis.   5. The aortic valve is tricuspid. There is mild calcification of the  aortic valve. Aortic valve regurgitation is not visualized. No aortic  stenosis is present.   6. Aortic dilatation noted. There is mild dilatation of the aortic root,  measuring 42 mm.   7. The inferior vena cava is normal in size with greater than 50%  respiratory variability, suggesting right atrial pressure of 3 mmHg.     Neuro/Psych  PSYCHIATRIC DISORDERS Anxiety     TIA   GI/Hepatic Neg liver ROS, PUD,GERD  Medicated,,  Endo/Other  diabetes, Type 2    Renal/GU Renal disease     Musculoskeletal   Abdominal   Peds  Hematology  (+) Blood dyscrasia, anemia   Anesthesia Other Findings   Reproductive/Obstetrics                              Anesthesia Physical Anesthesia Plan  ASA: 3  Anesthesia Plan: General   Post-op Pain Management: Ofirmev IV (intra-op)*   Induction: Intravenous  PONV Risk Score and Plan: 2 and Ondansetron and Treatment may vary due to age or medical condition  Airway Management Planned: Oral ETT  Additional Equipment: None  Intra-op Plan:   Post-operative Plan: Extubation in OR  Informed Consent: I have reviewed the patients History and Physical, chart, labs and discussed the procedure including the risks, benefits and alternatives for the proposed anesthesia with the patient or authorized representative who has indicated his/her understanding and acceptance.     Dental advisory given  Plan Discussed with: CRNA  Anesthesia Plan Comments:        Anesthesia Quick Evaluation

## 2023-03-08 NOTE — Anesthesia Postprocedure Evaluation (Signed)
 Anesthesia Post Note  Patient: [name] [name]  Procedure(s) Performed: INTRAMEDULLARY (IM) NAIL INTERTROCHANTERIC (Right)     Patient location during evaluation: PACU Anesthesia Type: General Level of consciousness: awake and alert Pain management: pain level controlled Vital Signs Assessment: post-procedure vital signs reviewed and stable Respiratory status: spontaneous breathing, nonlabored ventilation, respiratory function stable and patient connected to nasal cannula oxygen Cardiovascular status: blood pressure returned to baseline and stable Postop Assessment: no apparent nausea or vomiting Anesthetic complications: no  No notable events documented.  Last Vitals:  Vitals:   03/08/23 1245 03/08/23 1300  BP: (!) 143/58 (!) 134/56  Pulse: 61 61  Resp: 18 16  Temp:  36.7 C  SpO2: 91% 91%    Last Pain:  Vitals:   03/08/23 1300  TempSrc: Oral  PainSc:                  Shelton Silvas

## 2023-03-08 NOTE — Plan of Care (Signed)

## 2023-03-08 NOTE — Discharge Instructions (Signed)
 Discharge Instructions    Pain medicine has been prescribed for you.  Ice the operative area  You may shower 7 days after surgery. The incisions CANNOT get wet prior to 7 days.  Weight bearing as tolerated   Please call [phone] during normal business hours or [phone] after hours for any problems. Including the following:  - excessive redness of the incisions - drainage for more than 4 days - fever of more than 101.5 F  *Please note that pain medications will not be refilled after hours or on weekends.

## 2023-03-08 NOTE — Anesthesia Procedure Notes (Signed)
 Procedure Name: Intubation Date/Time: 03/08/2023 10:52 AM  Performed by: Loleta Willodene Stallings, CRNAPre-anesthesia Checklist: Patient identified, Patient being monitored, Timeout performed, Emergency Drugs available and Suction available Patient Re-evaluated:Patient Re-evaluated prior to induction Oxygen Delivery Method: Circle system utilized Preoxygenation: Pre-oxygenation with 100% oxygen Induction Type: IV induction Ventilation: Mask ventilation without difficulty Laryngoscope Size: Mac and 4 Grade View: Grade I Tube type: Oral Tube size: 7.0 mm Number of attempts: 1 Airway Equipment and Method: Stylet Placement Confirmation: ETT inserted through vocal cords under direct vision, positive ETCO2 and breath sounds checked- equal and bilateral Secured at: 22 cm Tube secured with: Tape Dental Injury: Teeth and Oropharynx as per pre-operative assessment

## 2023-03-08 NOTE — Transfer of Care (Signed)
 Immediate Anesthesia Transfer of Care Note  Patient: [name] [name]  Procedure(s) Performed: INTRAMEDULLARY (IM) NAIL INTERTROCHANTERIC (Right)  Patient Location: PACU  Anesthesia Type:General  Level of Consciousness: drowsy  Airway & Oxygen Therapy: Patient Spontanous Breathing and non-rebreather face mask  Post-op Assessment: Report given to RN and Post -op Vital signs reviewed and stable  Post vital signs: Reviewed and stable  Last Vitals:  Vitals Value Taken Time  BP 132/49 03/08/23 1150  Temp    Pulse 60 03/08/23 1159  Resp 21 03/08/23 1159  SpO2 92 % 03/08/23 1159  Vitals shown include unfiled device data.  Last Pain:  Vitals:   03/08/23 1000  TempSrc: Oral  PainSc:          Complications: No notable events documented.

## 2023-03-09 ENCOUNTER — Encounter (HOSPITAL_COMMUNITY): Payer: Self-pay | Admitting: Orthopedic Surgery

## 2023-03-09 DIAGNOSIS — S72141P Displaced intertrochanteric fracture of right femur, subsequent encounter for closed fracture with malunion: Secondary | ICD-10-CM | POA: Diagnosis not present

## 2023-03-09 DIAGNOSIS — J449 Chronic obstructive pulmonary disease, unspecified: Secondary | ICD-10-CM | POA: Diagnosis not present

## 2023-03-09 DIAGNOSIS — E785 Hyperlipidemia, unspecified: Secondary | ICD-10-CM | POA: Diagnosis not present

## 2023-03-09 DIAGNOSIS — D649 Anemia, unspecified: Secondary | ICD-10-CM | POA: Diagnosis not present

## 2023-03-09 LAB — IRON AND TIBC
Iron: 33 ug/dL — ABNORMAL LOW (ref 45–182)
Saturation Ratios: 17 % — ABNORMAL LOW (ref 17.9–39.5)
TIBC: 195 ug/dL — ABNORMAL LOW (ref 250–450)
UIBC: 162 ug/dL

## 2023-03-09 LAB — CBC WITH DIFFERENTIAL/PLATELET
Abs Immature Granulocytes: 0.05 10*3/uL (ref 0.00–0.07)
Basophils Absolute: 0 10*3/uL (ref 0.0–0.1)
Basophils Relative: 0 %
Eosinophils Absolute: 0 10*3/uL (ref 0.0–0.5)
Eosinophils Relative: 0 %
HCT: 31.6 % — ABNORMAL LOW (ref 39.0–52.0)
Hemoglobin: 10 g/dL — ABNORMAL LOW (ref 13.0–17.0)
Immature Granulocytes: 1 %
Lymphocytes Relative: 13 %
Lymphs Abs: 1.2 10*3/uL (ref 0.7–4.0)
MCH: 29.8 pg (ref 26.0–34.0)
MCHC: 31.6 g/dL (ref 30.0–36.0)
MCV: 94 fL (ref 80.0–100.0)
Monocytes Absolute: 0.7 10*3/uL (ref 0.1–1.0)
Monocytes Relative: 7 %
Neutro Abs: 7.3 10*3/uL (ref 1.7–7.7)
Neutrophils Relative %: 79 %
Platelets: 178 10*3/uL (ref 150–400)
RBC: 3.36 MIL/uL — ABNORMAL LOW (ref 4.22–5.81)
RDW: 12.9 % (ref 11.5–15.5)
WBC: 9.2 10*3/uL (ref 4.0–10.5)
nRBC: 0 % (ref 0.0–0.2)

## 2023-03-09 LAB — FOLATE: Folate: 3.4 ng/mL — ABNORMAL LOW (ref >5.9)

## 2023-03-09 LAB — HEMOGLOBIN A1C
Hgb A1c MFr Bld: 5.8 % — ABNORMAL HIGH (ref 4.8–5.6)
Mean Plasma Glucose: 119.76 mg/dL

## 2023-03-09 LAB — FERRITIN: Ferritin: 666 ng/mL — ABNORMAL HIGH (ref 24–336)

## 2023-03-09 LAB — BASIC METABOLIC PANEL
Anion gap: 9 (ref 5–15)
BUN: 24 mg/dL — ABNORMAL HIGH (ref 8–23)
CO2: 24 mmol/L (ref 22–32)
Calcium: 8.8 mg/dL — ABNORMAL LOW (ref 8.9–10.3)
Chloride: 104 mmol/L (ref 98–111)
Creatinine, Ser: 1.62 mg/dL — ABNORMAL HIGH (ref 0.61–1.24)
GFR, Estimated: 41 mL/min — ABNORMAL LOW (ref >60)
Glucose, Bld: 137 mg/dL — ABNORMAL HIGH (ref 70–99)
Potassium: 4.6 mmol/L (ref 3.5–5.1)
Sodium: 137 mmol/L (ref 135–145)

## 2023-03-09 LAB — GLUCOSE, CAPILLARY
Glucose-Capillary: 120 mg/dL — ABNORMAL HIGH (ref 70–99)
Glucose-Capillary: 131 mg/dL — ABNORMAL HIGH (ref 70–99)
Glucose-Capillary: 160 mg/dL — ABNORMAL HIGH (ref 70–99)
Glucose-Capillary: 182 mg/dL — ABNORMAL HIGH (ref 70–99)

## 2023-03-09 LAB — MAGNESIUM: Magnesium: 2.2 mg/dL (ref 1.7–2.4)

## 2023-03-09 LAB — VITAMIN B12: Vitamin B-12: 303 pg/mL (ref 180–914)

## 2023-03-09 MED ORDER — VITAMIN B-12 1000 MCG PO TABS
1000.0000 ug | ORAL_TABLET | Freq: Every day | ORAL | Status: DC
  Administered 2023-03-09 – 2023-03-10 (×2): 1000 ug via ORAL
  Filled 2023-03-09 (×2): qty 1

## 2023-03-09 MED ORDER — CLOPIDOGREL BISULFATE 75 MG PO TABS
75.0000 mg | ORAL_TABLET | Freq: Every day | ORAL | Status: DC
Start: 2023-03-09 — End: 2023-03-10
  Administered 2023-03-09 – 2023-03-10 (×2): 75 mg via ORAL
  Filled 2023-03-09 (×2): qty 1

## 2023-03-09 MED ORDER — HYDROCODONE-ACETAMINOPHEN 5-325 MG PO TABS
1.0000 | ORAL_TABLET | Freq: Four times a day (QID) | ORAL | Status: DC | PRN
  Administered 2023-03-09 – 2023-03-10 (×2): 1 via ORAL
  Filled 2023-03-09 (×3): qty 1

## 2023-03-09 MED ORDER — HYDROCODONE-ACETAMINOPHEN 5-325 MG PO TABS: 1.0000 | ORAL_TABLET | Freq: Four times a day (QID) | ORAL | 0 refills | Status: AC | PRN

## 2023-03-09 MED ORDER — FOLIC ACID 1 MG PO TABS
1.0000 mg | ORAL_TABLET | Freq: Every day | ORAL | Status: DC
  Administered 2023-03-09 – 2023-03-10 (×2): 1 mg via ORAL
  Filled 2023-03-09 (×2): qty 1

## 2023-03-09 MED ORDER — ALBUTEROL SULFATE (2.5 MG/3ML) 0.083% IN NEBU: 3.0000 mL | INHALATION_SOLUTION | RESPIRATORY_TRACT | Status: DC | PRN

## 2023-03-09 MED ORDER — ESCITALOPRAM OXALATE 10 MG PO TABS
10.0000 mg | ORAL_TABLET | Freq: Every day | ORAL | Status: DC
  Administered 2023-03-09 – 2023-03-10 (×2): 10 mg via ORAL
  Filled 2023-03-09 (×2): qty 1

## 2023-03-09 NOTE — [place] FL2 (Signed)
 [place] MEDICAID [id number] LEVEL OF CARE FORM     IDENTIFICATION  Patient Name: [name] [name] Birthdate: 1932/08/11 Sex: male Admission Date (Current Location): 03/07/2023  [facility] and [place] Number:  Producer, television/film/video and Address:  [facility] [place], [address], [place], [place] [address]      Provider Number: [id number]  Attending Physician Name and Address:  Rodolph Bong, MD  Relative Name and Phone Number:  Doo,Latina Daughter   [phone]    Current Level of Care: Hospital Recommended Level of Care: Skilled Nursing Facility Prior Approval Number:    Date Approved/Denied:   PASRR Number: [id number]  Discharge Plan: SNF    Current Diagnoses: Patient Active Problem List   Diagnosis Date Noted   Intertrochanteric fracture of right femur (HCC) 03/07/2023   CKD stage 3b, GFR 30-44 ml/min (HCC) 03/07/2023   Long Q-T syndrome 03/07/2023   History of CAD (coronary artery disease) 03/07/2023   GAD (generalized anxiety disorder) 03/07/2023   Chronic hypoxic respiratory failure (HCC) 03/07/2023   TIA (transient ischemic attack) 09/24/2022   Pulmonary nodule 09/24/2022   Prolonged QT interval 09/24/2022   Acute respiratory failure with hypoxia (HCC) 04/17/2022   Hypomagnesemia 04/17/2022   COVID-19 virus infection 04/17/2022   COPD with acute exacerbation (HCC) 04/16/2022   Acute metabolic encephalopathy 01/29/2022   Second degree AV block 01/29/2022   Heart block 01/28/2022   Carotid stenosis 01/28/2022   Closed compression fracture of L1 vertebra (HCC) 01/28/2022   GERD (gastroesophageal reflux disease) 01/28/2022   Bilateral carotid artery stenosis 05/07/2018   Preop cardiovascular exam 05/07/2018   Educated about COVID-19 virus infection 05/07/2018   Coronary artery disease involving native coronary artery of native heart without angina pectoris 05/07/2018   Tobacco abuse 01/03/2011   Pneumonia 01/02/2011   Acute on chronic anemia  01/02/2011   Acute renal failure superimposed on stage 3b chronic kidney disease (HCC) 01/02/2011   Elevated troponin 01/02/2011   Acute respiratory distress 01/02/2011   COPD (chronic obstructive pulmonary disease) (HCC) 01/02/2011   Carotid bruit 05/02/2010   Hyperlipidemia 03/16/2009   OBESITY, UNSPECIFIED 03/16/2009   TOBACCO ABUSE 03/16/2009   Non-insulin dependent type 2 diabetes mellitus (HCC) 03/14/2009   Essential hypertension 03/14/2009   Coronary atherosclerosis 03/14/2009   SLEEP APNEA 03/14/2009    Orientation RESPIRATION BLADDER Height & Weight     Self, Time, Situation, Place  O2 Continent Weight: 158 lb 1.1 oz (71.7 kg) Height:  5\' 3"  (160 cm)  BEHAVIORAL SYMPTOMS/MOOD NEUROLOGICAL BOWEL NUTRITION STATUS      Continent Diet (see discharge summary)  AMBULATORY STATUS COMMUNICATION OF NEEDS Skin   Total Care Verbally Surgical wounds                       Personal Care Assistance Level of Assistance  Bathing, Feeding, Dressing Bathing Assistance: Maximum assistance Feeding assistance: Limited assistance Dressing Assistance: Maximum assistance     Functional Limitations Info  Sight, Hearing, Speech Sight Info: Adequate Hearing Info: Impaired Speech Info: Adequate    SPECIAL CARE FACTORS FREQUENCY  PT (By licensed PT), OT (By licensed OT)     PT Frequency: 5x week OT Frequency: 5x week            Contractures Contractures Info: Not present    Additional Factors Info  Code Status, Allergies Code Status Info: full Allergies Info: NKA           Current Medications (03/09/2023):  This is the current hospital active medication list Current Facility-Administered Medications  Medication Dose Route Frequency Provider Last Rate Last Admin   acetaminophen (TYLENOL) tablet 650 mg  650 mg Oral Q6H PRN Porterfield, Amber, PA-C       Or   acetaminophen (TYLENOL) suppository 650 mg  650 mg Rectal Q6H PRN Porterfield, Amber, PA-C       albuterol  (PROVENTIL) (2.5 MG/3ML) 0.083% nebulizer solution 3 mL  3 mL Inhalation Q4H PRN Rodolph Bong, MD       amLODipine (NORVASC) tablet 5 mg  5 mg Oral Daily Porterfield, Amber, PA-C   5 mg at 03/09/23 0845   cholestyramine (QUESTRAN) packet 4 g  4 g Oral Daily Porterfield, Amber, PA-C   4 g at 03/08/23 1434   clopidogrel (PLAVIX) tablet 75 mg  75 mg Oral Daily Porterfield, Amber, PA-C   75 mg at 03/09/23 0845   cyanocobalamin (VITAMIN B12) tablet 1,000 mcg  1,000 mcg Oral Daily Rodolph Bong, MD       diclofenac Sodium (VOLTAREN) 1 % topical gel 2 g  2 g Topical TID PRN Porterfield, Amber, PA-C       docusate sodium (COLACE) capsule 100 mg  100 mg Oral BID Porterfield, Amber, PA-C       docusate sodium (COLACE) capsule 100 mg  100 mg Oral BID Porterfield, Amber, PA-C   100 mg at 03/09/23 0845   escitalopram (LEXAPRO) tablet 10 mg  10 mg Oral Daily Rodolph Bong, MD       ferrous sulfate tablet 325 mg  325 mg Oral Q breakfast Porterfield, Amber, PA-C   325 mg at 03/09/23 0845   folic acid (FOLVITE) tablet 1 mg  1 mg Oral Daily Rodolph Bong, MD       HYDROcodone-acetaminophen (NORCO/VICODIN) 5-325 MG per tablet 1 tablet  1 tablet Oral Q6H PRN Porterfield, Amber, PA-C   1 tablet at 03/09/23 0846   loperamide (IMODIUM) capsule 2 mg  2 mg Oral QHS Porterfield, Amber, PA-C   2 mg at 03/08/23 2055   melatonin tablet 5 mg  5 mg Oral QHS Porterfield, Amber, PA-C   5 mg at 03/08/23 2055   menthol-cetylpyridinium (CEPACOL) lozenge 3 mg  1 lozenge Oral PRN Porterfield, Amber, PA-C       Or   phenol (CHLORASEPTIC) mouth spray 1 spray  1 spray Mouth/Throat PRN Porterfield, Amber, PA-C       metoCLOPramide (REGLAN) tablet 5-10 mg  5-10 mg Oral Q8H PRN Porterfield, Amber, PA-C       Or   metoCLOPramide (REGLAN) injection 5-10 mg  5-10 mg Intravenous Q8H PRN Porterfield, Amber, PA-C       mometasone-formoterol (DULERA) 200-5 MCG/ACT inhaler 2 puff  2 puff Inhalation BID Porterfield, Amber, PA-C    2 puff at 03/09/23 0849   morphine (PF) 2 MG/ML injection 2 mg  2 mg Intravenous Q4H PRN Porterfield, Amber, PA-C       pantoprazole (PROTONIX) EC tablet 40 mg  40 mg Oral Q0600 Porterfield, Amber, PA-C   40 mg at 03/09/23 0510   rosuvastatin (CRESTOR) tablet 5 mg  5 mg Oral QHS Porterfield, Amber, PA-C   5 mg at 03/08/23 2056   senna-docusate (Senokot-S) tablet 1 tablet  1 tablet Oral QHS PRN Porterfield, Amber, PA-C       sodium chloride flush (NS) 0.9 % injection 3 mL  3 mL Intravenous Q12H Porterfield, Amber, PA-C   3 mL at 03/08/23 2056  sodium chloride flush (NS) 0.9 % injection 3 mL  3 mL Intravenous PRN Porterfield, Amber, PA-C       umeclidinium bromide (INCRUSE ELLIPTA) 62.5 MCG/ACT 1 puff  1 puff Inhalation Daily Porterfield, Amber, PA-C   1 puff at 03/09/23 1191   Facility-Administered Medications Ordered in Other Encounters  Medication Dose Route Frequency Provider Last Rate Last Admin   gemcitabine (GEMZAR) chemo syringe for bladder instillation 2,000 mg  2,000 mg Bladder Instillation Once Marcine Matar, MD         Discharge Medications: Please see discharge summary for a list of discharge medications.  Relevant Imaging Results:  Relevant Lab Results:   Additional Information SSN: [id number]  Lorri Frederick, LCSW

## 2023-03-09 NOTE — Progress Notes (Addendum)
 PATIENT ID: [name] [name]  MRN: [id number]  DOB/AGE:  Mar 20, 1932 / 89 y.o.  1 Day Post-Op Procedure(s) (LRB): INTRAMEDULLARY (IM) NAIL INTERTROCHANTERIC (Right)  Subjective: Patient reports mild pain in the right hip. Eating breakfast in room.    Objective: Vital signs in last 24 hours: Temp:  [97.6 F (36.4 C)-98.3 F (36.8 C)] 98.3 F (36.8 C) (03/03 0740) Pulse Rate:  [52-74] 74 (03/03 0740) Resp:  [16-21] 16 (03/03 0515) BP: (125-166)/(49-69) 138/68 (03/03 0740) SpO2:  [90 %-98 %] 96 % (03/03 0740) Weight:  [71.7 kg] 71.7 kg (03/02 1016)  Intake/Output from previous day: 03/02 0701 - 03/03 0700 In: 1800 [I.V.:1500; IV Piggyback:300] Out: 600 [Urine:500; Blood:100]   Recent Labs    03/07/23 1714 03/07/23 1730 03/08/23 0500 03/09/23 0621  HGB 10.5* 10.9* 10.4* 10.0*   Recent Labs    03/08/23 0500 03/09/23 0621  WBC 12.2* 9.2  RBC 3.51* 3.36*  HCT 33.2* 31.6*  PLT 195 178   Recent Labs    03/08/23 0500 03/09/23 0621  NA 139 137  K 4.6 4.6  CL 102 104  CO2 24 24  BUN 22 24*  CREATININE 1.56* 1.62*  GLUCOSE 134* 137*  CALCIUM 9.2 8.8*   Recent Labs    03/07/23 1714  INR 1.1    Physical Exam: Neurologically intact Sensation intact distally Intact pulses distally Dorsiflexion/Plantar flexion intact Incision: dressing C/D/I No cellulitis present Compartment soft  Assessment/Plan: 1 Day Post-Op Procedure(s) (LRB): INTRAMEDULLARY (IM) NAIL INTERTROCHANTERIC (Right)   Advance diet Up with therapy Discharge to SNF when stable and beds arrangements made Weight Bearing as Tolerated (WBAT) VTE prophylaxis:  resume Plavix  Plan to work with PT today. Social worker to meet with patient regarding SNF DC arrangements. Ortho will continue to follow. Reinforce dressings as needed by RN.    Reshanda Lewey L. Porterfield, PA-C 03/09/2023, 8:12 AM

## 2023-03-09 NOTE — Evaluation (Signed)
 Physical Therapy Evaluation Patient Details Name: [name] [name] MRN: [id number] DOB: 1932-06-05 Today's Date: 03/09/2023  History of Present Illness  [name] [name] is a 89 y.o. male  presented to emergency department after an sustained fall; s/p surgical fixation of R hip fx, WBAT; with medical history significant of TIA, DM type II, CKD stage IIIb, hyperlipidemia, essential hypertension, COPD, chronic hypoxic respiratory failure 2 L oxygen at baseline, former smoker,CAD, carotid stenosis, GERD, secondary AV block, pulmonary nodule and prolonged QT  Clinical Impression   Pt admitted with above diagnosis. Not exactly sure how reliable a historian pt is, but he reports he Lives at home alone, in a single-level home with a few steps to enter; Prior to admission, pt was able to walk household distances with RW; Presents to PT with a significant decrease in functional independence compared to his stated baseline; Needs 2 person assist for bed mobility, to stand to RW; Today, pt needed 2 person Max assist with to take pivot steps bed to chair due to RLE buckling in stance; anticipate the need for postacute rehab; Patient will benefit from continued inpatient follow up therapy, <3 hours/day;  Pt currently with functional limitations due to the deficits listed below (see PT Problem List). Pt will benefit from skilled PT to increase their independence and safety with mobility to allow discharge to the venue listed below.           If plan is discharge home, recommend the following: A lot of help with walking and/or transfers;A lot of help with bathing/dressing/bathroom   Can travel by private vehicle   No    Equipment Recommendations Rolling walker (2 wheels);BSC/3in1  Recommendations for Other Services       Functional Status Assessment Patient has had a recent decline in their functional status and demonstrates the ability to make significant improvements in function in a reasonable and predictable  amount of time.     Precautions / Restrictions Precautions Precautions: Fall Recall of Precautions/Restrictions: Intact Restrictions RLE Weight Bearing Per Provider Order: Weight bearing as tolerated      Mobility  Bed Mobility Overal bed mobility: Needs Assistance Bed Mobility: Supine to Sit     Supine to sit: +2 for physical assistance, Mod assist     General bed mobility comments: Cues for technique; very painful; difficult to use LLE to half bridge to scoot to EOB to preposition; Heavy mod assis tof 2 to elevate trunk to sit an dstabilize    Transfers Overall transfer level: Needs assistance Equipment used: Rolling walker (2 wheels) Transfers: Sit to/from Stand, Bed to chair/wheelchair/BSC Sit to Stand: +2 physical assistance, Mod assist   Step pivot transfers: +2 physical assistance, Max assist       General transfer comment: Heavy mod assist to power up to stand; very painful RLE in stance with significant buckle in R single limb stance requiring Max assist of 2 to prevent fall and stabilize as pt pivoted to recliner    Ambulation/Gait                  Stairs            Wheelchair Mobility     Tilt Bed    Modified Rankin (Stroke Patients Only)       Balance Overall balance assessment: Needs assistance   Sitting balance-Leahy Scale: Fair       Standing balance-Leahy Scale: Zero  Pertinent Vitals/Pain Pain Assessment Pain Assessment: Faces Faces Pain Scale: Hurts whole lot Pain Location: RLE with weight bearing Pain Descriptors / Indicators: Grimacing, Guarding Pain Intervention(s): Monitored during session, Repositioned    Home Living Family/patient expects to be discharged to:: Private residence Living Arrangements: Alone Available Help at Discharge: Family;Available PRN/intermittently Type of Home: House Home Access: Ramped entrance       Home Layout: One level Home Equipment:  Agricultural consultant (2 wheels);Wheelchair - manual (Oxygen) Additional Comments: Will need to verify home info    Prior Function Prior Level of Function : Needs assist             Mobility Comments: RW ambulation with supervision ADLs Comments: PRN asssist for bathing and dressing. Assist IADL's.     Extremity/Trunk Assessment   Upper Extremity Assessment Upper Extremity Assessment: Defer to OT evaluation    Lower Extremity Assessment Lower Extremity Assessment: Generalized weakness;RLE deficits/detail RLE Deficits / Details: Decr AROM and strength, limited by pain post hip fx and surgical fixation RLE: Unable to fully assess due to pain       Communication   Communication Communication: Impaired Factors Affecting Communication: Hearing impaired    Cognition Arousal: Alert Behavior During Therapy: WFL for tasks assessed/performed   PT - Cognitive impairments: No family/caregiver present to determine baseline                       PT - Cognition Comments: Unsure of reliability of pt's stated home setup Following commands: Intact       Cueing Cueing Techniques: Verbal cues, Tactile cues, Gestural cues     General Comments General comments (skin integrity, edema, etc.): Supported pt on supplemental O2  throughout session, 5L    Exercises Total Joint Exercises Ankle Circles/Pumps: AROM, Both, 15 reps Quad Sets: AROM, Right, 5 reps (weak) Heel Slides: AAROM, Right (2 reps)   Assessment/Plan    PT Assessment Patient needs continued PT services  PT Problem List Decreased strength;Decreased range of motion;Decreased activity tolerance;Decreased balance;Decreased mobility;Decreased coordination;Decreased cognition;Decreased knowledge of use of DME;Decreased safety awareness;Decreased knowledge of precautions       PT Treatment Interventions DME instruction;Gait training;Stair training;Functional mobility training;Therapeutic activities;Therapeutic  exercise;Balance training;Neuromuscular re-education;Cognitive remediation;Patient/family education;Wheelchair mobility training    PT Goals (Current goals can be found in the Care Plan section)  Acute Rehab PT Goals Patient Stated Goal: Less pain PT Goal Formulation: With patient Time For Goal Achievement: 03/23/23 Potential to Achieve Goals: Fair    Frequency Min 1X/week     Co-evaluation               AM-PAC PT "6 Clicks" Mobility  Outcome Measure Help needed turning from your back to your side while in a flat bed without using bedrails?: A Lot Help needed moving from lying on your back to sitting on the side of a flat bed without using bedrails?: A Lot Help needed moving to and from a bed to a chair (including a wheelchair)?: Total Help needed standing up from a chair using your arms (e.g., wheelchair or bedside chair)?: Total Help needed to walk in hospital room?: Total Help needed climbing 3-5 steps with a railing? : Total 6 Click Score: 8    End of Session Equipment Utilized During Treatment: Gait belt;Oxygen Activity Tolerance: Patient limited by pain Patient left: in chair;with call bell/phone within reach;with chair alarm set Nurse Communication: Mobility status PT Visit Diagnosis: Unsteadiness on feet (R26.81);Other abnormalities of gait and mobility (R26.89);History  of falling (Z91.81);Muscle weakness (generalized) (M62.81);Pain Pain - Right/Left: Right Pain - part of body: Hip    Time: 7846-9629 PT Time Calculation (min) (ACUTE ONLY): 37 min   Charges:   PT Evaluation $PT Eval Moderate Complexity: 1 Mod PT Treatments $Therapeutic Activity: 8-22 mins PT General Charges $$ ACUTE PT VISIT: 1 Visit         Van Clines, PT  Acute Rehabilitation Services Office [phone] Secure Chat welcomed   Levi Aland 03/09/2023, 2:15 PM

## 2023-03-09 NOTE — Progress Notes (Signed)
 Mobility Specialist Progress Note:    03/09/23 1500  Oxygen Therapy  O2 Device Nasal Cannula  Mobility  Activity Transferred from chair to bed  Level of Assistance +2 (takes two people)  Press photographer wheel walker  Distance Ambulated (ft) 4 ft  RLE Weight Bearing Per Provider Order WBAT  Activity Response Tolerated well  Mobility Referral Yes (+2 help)  Mobility visit 1 Mobility  Mobility Specialist Start Time (ACUTE ONLY) 1434  Mobility Specialist Stop Time (ACUTE ONLY) 1441  Mobility Specialist Time Calculation (min) (ACUTE ONLY) 7 min   Pt received in chair, requesting assistance back to bed. Required modA+2 to stand and pivot back to bed. C/o RLE pain, otherwise asymptomatic. Pt left in bed with NT and family present.  D'Vante Earlene Plater Mobility Specialist Please contact via Special educational needs teacher or Rehab office at [phone]

## 2023-03-09 NOTE — Progress Notes (Signed)
 PROGRESS NOTE    [name] [name]  ZOX:[id number] DOB: 03-23-1932 DOA: 03/07/2023 PCP: Chilton Greathouse, MD    Chief Complaint  Patient presents with   Fall    Brief Narrative:  Patient is a 89 year old gentleman history of TIA, type 2 diabetes, CKD stage IIIb, hyperlipidemia, hypertension, chronic hypoxic respiratory failure on 2 L nasal cannula at baseline, COPD, CAD, carotid stenosis GERD, history of second-degree AV block, pulmonary nodule and prolonged QT presented to the ED after mechanical fall while she was hanging up his skull and head on the back of the door patient noted to have lost his balance and fell backwards on the floor.  Head CT done unremarkable.  Plain films of the right hip and pelvis done with an acute intertrochanteric right proximal femoral fracture.  Orthopedics consulted and patient for femoral fracture repair today 03/08/2023.   Assessment & Plan:   Principal Problem:   Intertrochanteric fracture of right femur (HCC) Active Problems:   Non-insulin dependent type 2 diabetes mellitus (HCC)   Hyperlipidemia   Essential hypertension   Acute on chronic anemia   COPD (chronic obstructive pulmonary disease) (HCC)   Bilateral carotid artery stenosis   GERD (gastroesophageal reflux disease)   Second degree AV block   TIA (transient ischemic attack)   CKD stage 3b, GFR 30-44 ml/min (HCC)   Long Q-T syndrome   History of CAD (coronary artery disease)   GAD (generalized anxiety disorder)   Chronic hypoxic respiratory failure (HCC)  #1 acute right femoral intertrochanteric fracture -Secondary to mechanical fall. -Patient presented with complaints of right-sided hip pain. -CT head negative for any acute abnormalities. -Plain films of the right hip and pelvis with a intertrochanteric right proximal femoral fracture. -Patient seen in consultation by orthopedics and patient s/p right hip IM nail on 03/08/2023. -Plavix resumed per orthopedics.  -Continue current pain  management with Tylenol, Roxicodone, morphine as needed. -Pain management per orthopedics. -PT/OT.   -Per orthopedics.  2.  Second-degree AV block/history of QT prolongation -EKG done with ectopic atrial rhythm, sinus pause, prolonged PR interval heart rate of 72. -Keep electrolytes repleted potassium approximately 4.6, magnesium approximately 2.2. -Avoid QT prolongation medications.  3.  Acute on chronic anemia -Hemoglobin noted on admission at 10.5 with baseline 10-14. -Hemoglobin currently stable at 10.0.   -Anemia panel with iron level of 33, TIBC of 195, ferritin of 666, folate of 3.4.   -Vitamin B12 at 303.   -Start folic acid 1 mg daily as well as oral vitamin B12 1000 mcg daily. -Follow H&H. -Transfusion threshold hemoglobin < 8.  4.  Hypertension -BP somewhat elevated with systolic in the 160s on 03/08/2023. -BP improved on Norvasc 5 mg daily. -Continue to hold ARB and resume in the next 24 to 48 hours.  5.  Non insulin-dependent type 2 diabetes -Hemoglobin A1c noted at 5.9 (09/24/2022) -Patient not on diabetic medications on med rec. -Repeat hemoglobin A1c 5.8.  -Outpatient follow-up.  6.  Chronic respiratory failure on 2 L home O2 at baseline/history of COPD -Stable. -Keep sats between 88 and 93%. -DuoNebs as needed. -Continue Incruse Ellipta, Dulera.  7.  Bilateral carotid artery stenosis/CAD/TIA -Plavix resumed per orthopedics today.   -Continue Crestor.   8.  CKD stage IIIb -Stable.  9.  General Anxiety disorder -Resume home regimen Lexapro.  10.  Hypomagnesemia -Repleted. -Repeat labs in the AM.   DVT prophylaxis: Postop DVT prophylaxis per Orthopedics. Code Status: Full Family Communication: Updated patient, no family at bedside. Disposition: TBD  Status is: Inpatient Remains inpatient appropriate because: Severity of illness   Consultants:  Orthopedics: Dr. Ave Filter 03/07/2023  Procedures:  CT head 03/07/2023 Chest x-ray 03/07/2023 Plain films  of the pelvis 03/07/2023 Plain films of the right hip and pelvis 03/07/2023 Intramedullary nail right intertrochanteric proximal femur.  Per orthopedics: Dr. Ave Filter 03/08/2023  Antimicrobials:  Anti-infectives (From admission, onward)    Start     Dose/Rate Route Frequency Ordered Stop   03/08/23 1007  ceFAZolin (ANCEF) 2-4 GM/100ML-% IVPB       Note to Pharmacy: Aquilla Hacker M: cabinet override      03/08/23 1007 03/08/23 1104   03/08/23 0900  ceFAZolin (ANCEF) IVPB 2g/100 mL premix        2 g 200 mL/hr over 30 Minutes Intravenous On call to O.R. 03/08/23 0815 03/08/23 1054         Subjective: Patient lying in bed.  Denies any chest pain or shortness of breath.  No abdominal pain.  Denies any right hip pain.   Objective: Vitals:   03/08/23 2009 03/08/23 2041 03/09/23 0515 03/09/23 0740  BP: 127/66  126/69 138/68  Pulse: (!) 57  64 74  Resp: 20  16   Temp: 97.7 F (36.5 C)  98 F (36.7 C) 98.3 F (36.8 C)  TempSrc: Oral   Oral  SpO2: 97% 98% 96% 96%  Weight:      Height:        Intake/Output Summary (Last 24 hours) at 03/09/2023 1135 Last data filed at 03/09/2023 0829 Gross per 24 hour  Intake 1720 ml  Output 600 ml  Net 1120 ml   Filed Weights   03/08/23 1016  Weight: 71.7 kg    Examination:  General exam: Appears calm and comfortable.  Respiratory system: CTAB anterior lung fields.  No wheezes, no crackles, no rhonchi.  Fair air movement.  Speaking in full sentences.   Cardiovascular system: RRR no murmurs rubs or gallops.  No JVD.  No pitting lower extremity edema. Gastrointestinal system: Abdomen is soft, nontender, nondistended, positive bowel sounds.  No rebound.  No guarding.  Central nervous system: Alert and oriented. No focal neurological deficits. Extremities: Right lower extremity with postop dressing in place.  Nontender to palpation.  Skin: No rashes, lesions or ulcers Psychiatry: Judgement and insight appear normal. Mood & affect appropriate.      Data Reviewed: I have personally reviewed following labs and imaging studies  CBC: Recent Labs  Lab 03/07/23 1714 03/07/23 1730 03/08/23 0500 03/09/23 0621  WBC 10.7*  --  12.2* 9.2  NEUTROABS  --   --   --  7.3  HGB 10.5* 10.9* 10.4* 10.0*  HCT 33.6* 32.0* 33.2* 31.6*  MCV 94.9  --  94.6 94.0  PLT 192  --  195 178    Basic Metabolic Panel: Recent Labs  Lab 03/07/23 1714 03/07/23 1730 03/08/23 0500 03/09/23 0621  NA 140 139 139 137  K 4.2 4.2 4.6 4.6  CL 105 106 102 104  CO2 24  --  24 24  GLUCOSE 145* 142* 134* 137*  BUN 21 28* 22 24*  CREATININE 1.58* 1.70* 1.56* 1.62*  CALCIUM 9.0  --  9.2 8.8*  MG  --   --  1.3* 2.2    GFR: Estimated Creatinine Clearance: 28 mL/min (A) (by C-G formula based on SCr of 1.62 mg/dL (H)).  Liver Function Tests: Recent Labs  Lab 03/07/23 1714 03/08/23 0500  AST 16 15  ALT 7 10  ALKPHOS 50 48  BILITOT 0.8 0.8  PROT 6.0* 5.8*  ALBUMIN 3.0* 2.8*    CBG: Recent Labs  Lab 03/08/23 1156 03/08/23 1624 03/08/23 2131 03/09/23 0646 03/09/23 1123  GLUCAP 136* 143* 249* 120* 131*     Recent Results (from the past 240 hours)  Surgical pcr screen     Status: None   Collection Time: 03/08/23  8:35 AM   Specimen: Nasal Mucosa; Nasal Swab  Result Value Ref Range Status   MRSA, PCR NEGATIVE NEGATIVE Final   Staphylococcus aureus NEGATIVE NEGATIVE Final    Comment: (NOTE) The Xpert SA Assay (FDA approved for NASAL specimens in patients 61 years of age and older), is one component of a comprehensive surveillance program. It is not intended to diagnose infection nor to guide or monitor treatment. Performed at [facility], [address], [place], [place] [address]          Radiology Studies: DG HIP UNILAT WITH PELVIS 2-3 VIEWS RIGHT Result Date: 03/08/2023 CLINICAL DATA:  Hip fracture surgery EXAM: DG HIP (WITH OR WITHOUT PELVIS) 2-3V RIGHT COMPARISON:  03/07/2023 FINDINGS: Five low resolution  intraoperative spot views of the right hip. Total fluoroscopy time was 40.9 seconds, fluoroscopic dose of 5.66 mGy. The images were obtained during intramedullary rod and screw fixation of right intertrochanteric fracture IMPRESSION: Intraoperative fluoroscopic assistance provided during surgical fixation of right hip fracture Electronically Signed   By: Jasmine Pang M.D.   On: 03/08/2023 15:22   DG C-Arm 1-60 Min-No Report Result Date: 03/08/2023 Fluoroscopy was utilized by the requesting physician.  No radiographic interpretation.   DG Hip Unilat W or Wo Pelvis 2-3 Views Right Result Date: 03/07/2023 CLINICAL DATA:  Pain and deformity after fall EXAM: DG HIP (WITH OR WITHOUT PELVIS) 2-3V RIGHT COMPARISON:  09/24/2022 FINDINGS: Acute, mildly displaced right intertrochanteric femur fracture. Extensive Atherosclerotic changes seen throughout visualized arterial segments. IMPRESSION: Acute, mildly displaced right intertrochanteric femur fracture. Electronically Signed   By: Acquanetta Belling M.D.   On: 03/07/2023 19:36   CT Head Wo Contrast Result Date: 03/07/2023 CLINICAL DATA:  Head trauma, minor (Age >= 65y) EXAM: CT HEAD WITHOUT CONTRAST TECHNIQUE: Contiguous axial images were obtained from the base of the skull through the vertex without intravenous contrast. RADIATION DOSE REDUCTION: This exam was performed according to the departmental dose-optimization program which includes automated exposure control, adjustment of the mA and/or kV according to patient size and/or use of iterative reconstruction technique. COMPARISON:  Head CT and brain MRI 09/26/2022 FINDINGS: Brain: No intracranial hemorrhage, mass effect, or midline shift. Stable atrophy. No hydrocephalus. The basilar cisterns are patent. Stable advanced chronic small vessel ischemia. Remote lacunar infarcts in the basal ganglia. No evidence of territorial infarct or acute ischemia. No extra-axial or intracranial fluid collection. Vascular:  Atherosclerosis of skullbase vasculature without hyperdense vessel or abnormal calcification. Skull: No fracture or focal lesion. Sinuses/Orbits: Chronic mucosal thickening throughout the ethmoid air cells. There is increased mucosal thickening of both maxillary sinuses, no fluid levels. Scattered opacification of the mastoid air cells. Left cataract resection. Other: No confluent scalp hematoma. IMPRESSION: 1. No acute intracranial abnormality. No skull fracture. 2. Stable atrophy and advanced chronic small vessel ischemia. 3. Increased mucosal thickening of both maxillary sinuses. Electronically Signed   By: Narda Rutherford M.D.   On: 03/07/2023 18:45   DG Pelvis Portable Result Date: 03/07/2023 CLINICAL DATA:  Trauma EXAM: PORTABLE PELVIS 1-2 VIEWS COMPARISON:  X-ray pelvis 10/03/2022 FINDINGS: Limited evaluation due to overlapping  osseous structures and overlying soft tissues. Question poorly visualized acute, likely intertrochanteric, fracture of the right proximal femur. No acute displaced fracture or dislocation of the left hip on frontal view. There is no evidence of pelvic fracture or diastasis. No pelvic bone lesions are seen. Vascular calcification. IMPRESSION: Question poorly visualized acute, likely intertrochanteric, fracture of the right proximal femur. Electronically Signed   By: Tish Frederickson M.D.   On: 03/07/2023 17:38   DG Chest Port 1 View Result Date: 03/07/2023 CLINICAL DATA:  Trauma  trauma level 2, fall, pain on right hip EXAM: PORTABLE CHEST 1 VIEW COMPARISON:  Chest x-ray 10/03/2022 FINDINGS: The heart and mediastinal contours are unchanged. Atherosclerotic plaque. Low lung volumes. No focal consolidation. Chronic coarsened interstitial markings with no overt pulmonary edema. No pleural effusion. No pneumothorax. No acute osseous abnormality. Degenerative changes of the bilateral shoulders. IMPRESSION: No active disease. Electronically Signed   By: Tish Frederickson M.D.   On:  03/07/2023 17:36        Scheduled Meds:  amLODipine  5 mg Oral Daily   cholestyramine  4 g Oral Daily   clopidogrel  75 mg Oral Daily   docusate sodium  100 mg Oral BID   docusate sodium  100 mg Oral BID   ferrous sulfate  325 mg Oral Q breakfast   loperamide  2 mg Oral QHS   melatonin  5 mg Oral QHS   mometasone-formoterol  2 puff Inhalation BID   pantoprazole  40 mg Oral Q0600   rosuvastatin  5 mg Oral QHS   sodium chloride flush  3 mL Intravenous Q12H   umeclidinium bromide  1 puff Inhalation Daily   Continuous Infusions:     LOS: 2 days    Time spent: 35 minutes    Ramiro Harvest, MD [facility]   To contact the attending provider between 7A-7P or the covering provider during after hours 7P-7A, please log into the web site [email or web address] and access using universal [place] password for that web site. If you do not have the password, please call the hospital operator.  03/09/2023, 11:35 AM

## 2023-03-09 NOTE — TOC Initial Note (Signed)
 Transition of Care [facility]) - Initial/Assessment Note    Patient Details  Name: [name] [name] MRN: [id number] Date of Birth: 1932/09/20  [facility]) CM/SW Contact:    Lorri Frederick, LCSW Phone Number: 03/09/2023, 3:22 PM  Clinical Narrative:    CSW spoke with pt and son [name] [name], who goes by [employer].  Pt oriented x4, able to participate.  Kevin Fenton confirms pt from [facility], they do want pt to return for rehab as well.  Pt also agreeable to this.  Pt gives permission to speak with all four of his children listed on the face sheet.  CSW confirmed with Kristin/[facility] that they can receive pt back tomorrow.  Medicare payer with inpt order on 03/07/23.               Expected Discharge Plan: Skilled Nursing Facility Barriers to Discharge: No Barriers Identified   Patient Goals and CMS Choice   CMS Medicare.gov Compare Post Acute Care list provided to:: Patient Represenative (must comment) (son [name] [name]) Choice offered to / list presented to : Adult Children (son [name] [name])      Expected Discharge Plan and Services In-house Referral: Clinical Social Work   Post Acute Care Choice: Skilled Nursing Facility Living arrangements for the past 2 months: Skilled Electronics engineer)                                      Prior Living Arrangements/Services Living arrangements for the past 2 months: Skilled Electronics engineer) Lives with:: Facility Resident Patient language and need for interpreter reviewed:: Yes Do you feel safe going back to the place where you live?: Yes      Need for Family Participation in Patient Care: Yes (Comment)   Current home services: Other (comment) (na) Criminal Activity/Legal Involvement Pertinent to Current Situation/Hospitalization: No - Comment as needed  Activities of Daily Living   ADL Screening (condition at time of admission) Independently performs ADLs?: Yes (appropriate for developmental age) Is the  patient deaf or have difficulty hearing?: Yes Does the patient have difficulty seeing, even when wearing glasses/contacts?: No Does the patient have difficulty concentrating, remembering, or making decisions?: No  Permission Sought/Granted Permission sought to share information with : Family Supports Permission granted to share information with : Yes, Verbal Permission Granted  Share Information with NAME: all four listed children  Permission granted to share info w AGENC[facility]: [facility]        Emotional Assessment Appearance:: Appears stated age Attitude/Demeanor/Rapport: Engaged Affect (typically observed): Pleasant, Appropriate Orientation: : Oriented to Self, Oriented to Place, Oriented to  Time, Oriented to Situation      Admission diagnosis:  Intertrochanteric fracture of right femur (HCC) [S72.141A] Fall, initial encounter [W19.XXXA] Closed 2-part intertrochanteric fracture of right femur, initial encounter (HCC) [S72.141A] Patient Active Problem List   Diagnosis Date Noted   Intertrochanteric fracture of right femur (HCC) 03/07/2023   CKD stage 3b, GFR 30-44 ml/min (HCC) 03/07/2023   Long Q-T syndrome 03/07/2023   History of CAD (coronary artery disease) 03/07/2023   GAD (generalized anxiety disorder) 03/07/2023   Chronic hypoxic respiratory failure (HCC) 03/07/2023   TIA (transient ischemic attack) 09/24/2022   Pulmonary nodule 09/24/2022   Prolonged QT interval 09/24/2022   Acute respiratory failure with hypoxia (HCC) 04/17/2022   Hypomagnesemia 04/17/2022   COVID-19 virus infection 04/17/2022   COPD with acute exacerbation (HCC) 04/16/2022   Acute metabolic  encephalopathy 01/29/2022   Second degree AV block 01/29/2022   Heart block 01/28/2022   Carotid stenosis 01/28/2022   Closed compression fracture of L1 vertebra (HCC) 01/28/2022   GERD (gastroesophageal reflux disease) 01/28/2022   Bilateral carotid artery stenosis 05/07/2018   Preop cardiovascular exam  05/07/2018   Educated about COVID-19 virus infection 05/07/2018   Coronary artery disease involving native coronary artery of native heart without angina pectoris 05/07/2018   Tobacco abuse 01/03/2011   Pneumonia 01/02/2011   Acute on chronic anemia 01/02/2011   Acute renal failure superimposed on stage 3b chronic kidney disease (HCC) 01/02/2011   Elevated troponin 01/02/2011   Acute respiratory distress 01/02/2011   COPD (chronic obstructive pulmonary disease) (HCC) 01/02/2011   Carotid bruit 05/02/2010   Hyperlipidemia 03/16/2009   OBESITY, UNSPECIFIED 03/16/2009   TOBACCO ABUSE 03/16/2009   Non-insulin dependent type 2 diabetes mellitus (HCC) 03/14/2009   Essential hypertension 03/14/2009   Coronary atherosclerosis 03/14/2009   SLEEP APNEA 03/14/2009   PCP:  Chilton Greathouse, MD Pharmacy:   [facility] - [place], [place] - [address] [place] [address] Phone: [phone] Fax: [phone]  [facility] - [place], [place] - [address] [place] [address] Phone: [phone] Fax: [phone]     Social Drivers of Health (SDOH) Social History: SDOH Screenings   Food Insecurity: No Food Insecurity (03/07/2023)  Housing: Low Risk  (03/07/2023)  Transportation Needs: No Transportation Needs (03/07/2023)  Utilities: Not At Risk (03/07/2023)  Social Connections: Moderately Integrated (03/07/2023)  Tobacco Use: High Risk (03/08/2023)   SDOH Interventions:     Readmission Risk Interventions     No data to display

## 2023-03-10 DIAGNOSIS — S72141P Displaced intertrochanteric fracture of right femur, subsequent encounter for closed fracture with malunion: Secondary | ICD-10-CM | POA: Diagnosis not present

## 2023-03-10 DIAGNOSIS — D649 Anemia, unspecified: Secondary | ICD-10-CM | POA: Diagnosis not present

## 2023-03-10 DIAGNOSIS — J449 Chronic obstructive pulmonary disease, unspecified: Secondary | ICD-10-CM | POA: Diagnosis not present

## 2023-03-10 DIAGNOSIS — E785 Hyperlipidemia, unspecified: Secondary | ICD-10-CM | POA: Diagnosis not present

## 2023-03-10 LAB — CBC
HCT: 29.9 % — ABNORMAL LOW (ref 39.0–52.0)
Hemoglobin: 9.5 g/dL — ABNORMAL LOW (ref 13.0–17.0)
MCH: 30.1 pg (ref 26.0–34.0)
MCHC: 31.8 g/dL (ref 30.0–36.0)
MCV: 94.6 fL (ref 80.0–100.0)
Platelets: 168 10*3/uL (ref 150–400)
RBC: 3.16 MIL/uL — ABNORMAL LOW (ref 4.22–5.81)
RDW: 13 % (ref 11.5–15.5)
WBC: 9.2 10*3/uL (ref 4.0–10.5)
nRBC: 0 % (ref 0.0–0.2)

## 2023-03-10 LAB — BASIC METABOLIC PANEL
Anion gap: 8 (ref 5–15)
BUN: 28 mg/dL — ABNORMAL HIGH (ref 8–23)
CO2: 26 mmol/L (ref 22–32)
Calcium: 8.8 mg/dL — ABNORMAL LOW (ref 8.9–10.3)
Chloride: 102 mmol/L (ref 98–111)
Creatinine, Ser: 1.7 mg/dL — ABNORMAL HIGH (ref 0.61–1.24)
GFR, Estimated: 38 mL/min — ABNORMAL LOW (ref >60)
Glucose, Bld: 143 mg/dL — ABNORMAL HIGH (ref 70–99)
Potassium: 4.4 mmol/L (ref 3.5–5.1)
Sodium: 136 mmol/L (ref 135–145)

## 2023-03-10 LAB — GLUCOSE, CAPILLARY
Glucose-Capillary: 104 mg/dL — ABNORMAL HIGH (ref 70–99)
Glucose-Capillary: 164 mg/dL — ABNORMAL HIGH (ref 70–99)

## 2023-03-10 LAB — MAGNESIUM: Magnesium: 1.9 mg/dL (ref 1.7–2.4)

## 2023-03-10 MED ORDER — MOMETASONE FURO-FORMOTEROL FUM 200-5 MCG/ACT IN AERO: 2.0000 | INHALATION_SPRAY | Freq: Two times a day (BID) | RESPIRATORY_TRACT | Status: AC

## 2023-03-10 MED ORDER — IRBESARTAN 300 MG PO TABS: 300.0000 mg | ORAL_TABLET | Freq: Every day | ORAL | Status: AC

## 2023-03-10 MED ORDER — TIOTROPIUM BROMIDE MONOHYDRATE 18 MCG IN CAPS: 18.0000 ug | ORAL_CAPSULE | Freq: Every day | RESPIRATORY_TRACT | Status: AC

## 2023-03-10 MED ORDER — DOCUSATE SODIUM 100 MG PO CAPS: 100.0000 mg | ORAL_CAPSULE | Freq: Two times a day (BID) | ORAL | Status: DC

## 2023-03-10 MED ORDER — FOLIC ACID 1 MG PO TABS: 1.0000 mg | ORAL_TABLET | Freq: Every day | ORAL | Status: AC

## 2023-03-10 MED ORDER — CYANOCOBALAMIN 1000 MCG PO TABS: 1000.0000 ug | ORAL_TABLET | Freq: Every day | ORAL | Status: AC

## 2023-03-10 NOTE — TOC Transition Note (Signed)
 Transition of Care [facility]) - Discharge Note   Patient Details  Name: [name] [name] MRN: [id number] Date of Birth: [birth year]  [facility]) CM/SW Contact:  Lorri Frederick, LCSW Phone Number: 03/10/2023, 12:56 PM   Clinical Narrative:   Pt discharging to [facility].  RN call report to [phone].   1000: CSW confirmed with Kristin/[facility] that they can receive pt today.     Final next level of care: Skilled Nursing Facility Barriers to Discharge: Barriers Resolved   Patient Goals and CMS Choice   CMS Medicare.gov Compare Post Acute Care list provided to:: Patient Represenative (must comment) (son [name]) Choice offered to / list presented to : Adult Children (son [name])      Discharge Placement              Patient chooses bed at: [facility] Patient to be transferred to facility by: [facility]tar Name of family member notified: son Kevin Fenton in room Patient and family notified of of transfer: 03/10/23  Discharge Plan and Services Additional resources added to the After Visit Summary for   In-house Referral: Clinical Social Work   Post Acute Care Choice: Skilled Nursing Facility                               Social Drivers of Health (SDOH) Interventions SDOH Screenings   Food Insecurity: No Food Insecurity (03/07/2023)  Housing: Low Risk  (03/07/2023)  Transportation Needs: No Transportation Needs (03/07/2023)  Utilities: Not At Risk (03/07/2023)  Social Connections: Moderately Integrated (03/07/2023)  Tobacco Use: High Risk (03/08/2023)     Readmission Risk Interventions     No data to display

## 2023-03-10 NOTE — Discharge Summary (Signed)
 Physician Discharge Summary  [name] [name]:[id number] DOB: [birth year] DOA: 03/07/2023  PCP: Chilton Greathouse, MD  Admit date: 03/07/2023 Discharge date: 03/10/2023  Time spent: 60 minutes  Recommendations for Outpatient Follow-up:  Follow-up with Fredia Sorrow, PA, orthopedics on 03/20/2023. Follow-up with MD at SNF.  Patient will need a basic metabolic profile, CBC done in 1 week to follow-up on electrolytes and counts.   Discharge Diagnoses:  Principal Problem:   Intertrochanteric fracture of right femur (HCC) Active Problems:   Non-insulin dependent type 2 diabetes mellitus (HCC)   Hyperlipidemia   Essential hypertension   Acute on chronic anemia   COPD (chronic obstructive pulmonary disease) (HCC)   Bilateral carotid artery stenosis   GERD (gastroesophageal reflux disease)   Second degree AV block   TIA (transient ischemic attack)   CKD stage 3b, GFR 30-44 ml/min (HCC)   Long Q-T syndrome   History of CAD (coronary artery disease)   GAD (generalized anxiety disorder)   Chronic hypoxic respiratory failure (HCC)   Discharge Condition: Stable and improved.  Diet recommendation: Heart healthy  Filed Weights   03/08/23 1016  Weight: 71.7 kg    History of present illness:  HPI per Dr. [name] is a 89 y.o. male with medical history significant of TIA, DM type II, CKD stage IIIb, hyperlipidemia, essential hypertension, COPD, chronic hypoxic respiratory failure 2 L oxygen at baseline, former smoker,CAD, carotid stenosis, GERD, secondary AV block, pulmonary nodule and prolonged QT presented to emergency department after an sustained fall.  Patient reports that he had came home and he was hanging up his coat and hat on the back of the door when he lost his balance and fell backwards onto the floor.  He does report hitting his head on the floor but denies loss of consciousness.  Since the fall he has had severe pain in his right hip and has been unable to ambulate.  EMS  reported deformity of the right leg and severe pain.  Patient received 100 mcg of fentanyl and route.  He currently only complains of pain in his right hip.  Patient denies any headache, neck pain, dizziness, loss of consciousness, chest pain, shortness of breath, abdominal pain bilateral lower extremity swelling.  Denies any fever, chill, nausea and vomiting.  No other complaint at this time.   At presentation to ED patient is hemodynamically stable but currently O2 sat 88% on 3 L oxygen. Normal lactic acid level. CMP showing creatinine 1.57 and GFR 42 which is baseline. CBC showing mild leukocytosis 10.  7.  H&H 10.5 and 33.6.  Baseline hemoglobin around 12-14. Normal blood alcohol able. Normal pro time INR.   EKG showing sinus and ectopic atrial rhythm heart rate 72.  Sinus pause, prolonged PR interval.  Right bundle branch block.   CT head no acute intracranial abnormality.  Stable atrophy and advanced chronic pastel ischemia.   Chest x-ray no active disease.   X-ray pelvis acute intertrochanteric right proximal femoral fracture.   In the ED patient has been evaluated by orthopedics team plan for right hip IM nail tomorrow 3/2.  Recommended keep n.p.o. after midnight, hold the Plavix but can resume day after surgery.   Hospitalist has been contacted for further management and admission for intertrochanteric right femoral fracture.     Significant labs in the ED: Lab Orders         Comprehensive metabolic panel         CBC  Ethanol         Urinalysis, Routine w reflex microscopic -Urine, Clean Catch         Protime-INR         CBC         Comprehensive metabolic panel         I-Stat Chem 8, ED         I-Stat Lactic Acid, ED        Hospital Course:  #1 acute right femoral intertrochanteric fracture -Secondary to mechanical fall. -Patient presented with complaints of right-sided hip pain. -CT head negative for any acute abnormalities. -Plain films of the right hip and  pelvis with a intertrochanteric right proximal femoral fracture. -Patient seen in consultation by orthopedics and patient s/p right hip IM nail on 03/08/2023. -Plavix resumed per orthopedics.  -Patient's pain was managed on Tylenol, Roxicodone and morphine as needed during the hospitalization.  Continue current pain management with Tylenol, Roxicodone, morphine as needed. -Pain management per orthopedics. -PT/OT assessed patient and patient be discharged to skilled nursing facility. -Outpatient follow-up with orthopedics..     2.  Second-degree AV block/history of QT prolongation -EKG done with ectopic atrial rhythm, sinus pause, prolonged PR interval heart rate of 72. -Electrolytes were repleted with potassium Approximately at 4 and magnesium approximately at 2.   -QT prolongation medications were avoided.    3.  Acute on chronic anemia -Hemoglobin noted on admission at 10.5 with baseline 10-14. -Hemoglobin remained stable at 9.5 by day of discharge.  -Anemia panel with iron level of 33, TIBC of 195, ferritin of 666, folate of 3.4.   -Vitamin B12 at 303.   -Started folic acid 1 mg daily as well as oral vitamin B12 1000 mcg daily.   4.  Hypertension -BP somewhat elevated with systolic in the 160s on 03/08/2023. -BP improved on Norvasc 5 mg daily. -ARB was held and will be resumed in the next 24 to 48 hours postdischarge.    5.  Non insulin-dependent type 2 diabetes -Hemoglobin A1c noted at 5.9 (09/24/2022) -Patient not on diabetic medications on med rec. -Repeat hemoglobin A1c 5.8.  -Outpatient follow-up.   6.  Chronic respiratory failure on 2 L home O2 at baseline/history of COPD -Stable. -Keep sats between 88 and 93%. -DuoNebs as needed. -Patient placed on Incruse Ellipta and Dulera during the hospitalization and he will be discharged on Dulera and Spiriva.    7.  Bilateral carotid artery stenosis/CAD/TIA -Plavix resumed per orthopedics.   -Patient maintained on home regimen  Crestor.    8.  CKD stage IIIb -Stable.   9.  General Anxiety disorder -Patient was placed back on his home regimen Lexapro.   10.  Hypomagnesemia -Repleted during the hospitalization.  Procedures: CT head 03/07/2023 Chest x-ray 03/07/2023 Plain films of the pelvis 03/07/2023 Plain films of the right hip and pelvis 03/07/2023 Intramedullary nail right intertrochanteric proximal femur.  Per orthopedics: Dr. Ave Filter 03/08/2023  Consultations: Orthopedics: Dr. Ave Filter 03/07/2023   Discharge Exam: Vitals:   03/10/23 0833 03/10/23 1153  BP: (!) 132/55   Pulse: 61   Resp: 17   Temp: 98.1 F (36.7 C)   SpO2: 94% 93%    General: NAD Cardiovascular: RRR no murmurs rubs or gallops.  No JVD.  No lower extremity edema. Respiratory: Clear to auscultation bilaterally anterior lung fields.  No wheezes, no crackles, no rhonchi.  Fair air movement.  Speaking in full sentences.  Discharge Instructions   Discharge Instructions     Diet -  low sodium heart healthy   Complete by: As directed    Increase activity slowly   Complete by: As directed    No wound care   Complete by: As directed    Weight bearing as tolerated   Complete by: As directed       Allergies as of 03/10/2023   No Known Allergies      Medication List     TAKE these medications    albuterol 108 (90 Base) MCG/ACT inhaler Commonly known as: VENTOLIN HFA Inhale 2 puffs into the lungs every 4 (four) hours as needed for wheezing or shortness of breath.   amLODipine 5 MG tablet Commonly known as: NORVASC Take 5 mg by mouth daily.   cholestyramine 4 g packet Commonly known as: QUESTRAN Take 4 g by mouth daily.   clopidogrel 75 MG tablet Commonly known as: PLAVIX Take 75 mg by mouth daily.   cyanocobalamin 1000 MCG tablet Take 1 tablet (1,000 mcg total) by mouth daily. Start taking on: March 11, 2023   docusate sodium 100 MG capsule Commonly known as: COLACE Take 1 capsule (100 mg total) by mouth 2 (two)  times daily.   escitalopram 10 MG tablet Commonly known as: LEXAPRO Take 10 mg by mouth daily.   ferrous sulfate 325 (65 FE) MG EC tablet Take 325 mg by mouth daily with breakfast.   folic acid 1 MG tablet Commonly known as: FOLVITE Take 1 tablet (1 mg total) by mouth daily. Start taking on: March 11, 2023   HYDROcodone-acetaminophen 5-325 MG tablet Commonly known as: NORCO/VICODIN Take 1 tablet by mouth every 6 (six) hours as needed for moderate pain (pain score 4-6).   irbesartan 300 MG tablet Commonly known as: AVAPRO Take 1 tablet (300 mg total) by mouth daily. Start taking on: March 12, 2023 What changed: These instructions start on March 12, 2023. If you are unsure what to do until then, ask your doctor or other care provider.   loperamide 2 MG tablet Commonly known as: IMODIUM A-D Take 2 mg by mouth at bedtime.   melatonin 5 MG Tabs Take 5 mg by mouth at bedtime.   mometasone-formoterol 200-5 MCG/ACT Aero Commonly known as: DULERA Inhale 2 puffs into the lungs 2 (two) times daily.   pantoprazole 40 MG tablet Commonly known as: PROTONIX Take 1 tablet (40 mg total) by mouth daily.   rosuvastatin 5 MG tablet Commonly known as: CRESTOR Take 5 mg by mouth at bedtime.   tiotropium 18 MCG inhalation capsule Commonly known as: Spiriva HandiHaler Place 1 capsule (18 mcg total) into inhaler and inhale daily.   Tylenol 325 MG tablet Generic drug: acetaminophen Take 650 mg by mouth 3 (three) times daily as needed for mild pain (pain score 1-3).   Voltaren Arthritis Pain 1 % Gel Generic drug: diclofenac Sodium Apply 2 g topically 3 (three) times daily as needed (pain).   zinc oxide 20 % ointment Apply 1 Application topically 3 (three) times daily as needed (dermatitis of buttocks).               Durable Medical Equipment  (From admission, onward)           Start     Ordered   03/10/23 0823  For home use only DME oxygen  Once       Comments: Patient on  chronic home oxygen  Question Answer Comment  Length of Need Lifetime   Mode or (Route) Nasal cannula   Liters per  Minute 2   Frequency Continuous (stationary and portable oxygen unit needed)   Oxygen conserving device Yes   Oxygen delivery system Gas      03/10/23 0822              Discharge Care Instructions  (From admission, onward)           Start     Ordered   03/09/23 0000  Weight bearing as tolerated        03/09/23 0818           No Known Allergies  Follow-up Information     Porterfield, Amber, PA-C. Schedule an appointment as soon as possible for a visit on 03/20/2023.   Specialty: Orthopedic Surgery Contact information: [address] [place] [address] [phone]         MD AT SNF Follow up.                   The results of significant diagnostics from this hospitalization (including imaging, microbiology, ancillary and laboratory) are listed below for reference.    Significant Diagnostic Studies: DG HIP UNILAT WITH PELVIS 2-3 VIEWS RIGHT Result Date: 03/08/2023 CLINICAL DATA:  Hip fracture surgery EXAM: DG HIP (WITH OR WITHOUT PELVIS) 2-3V RIGHT COMPARISON:  03/07/2023 FINDINGS: Five low resolution intraoperative spot views of the right hip. Total fluoroscopy time was 40.9 seconds, fluoroscopic dose of 5.66 mGy. The images were obtained during intramedullary rod and screw fixation of right intertrochanteric fracture IMPRESSION: Intraoperative fluoroscopic assistance provided during surgical fixation of right hip fracture Electronically Signed   By: Jasmine Pang M.D.   On: 03/08/2023 15:22   DG C-Arm 1-60 Min-No Report Result Date: 03/08/2023 Fluoroscopy was utilized by the requesting physician.  No radiographic interpretation.   DG Hip Unilat W or Wo Pelvis 2-3 Views Right Result Date: 03/07/2023 CLINICAL DATA:  Pain and deformity after fall EXAM: DG HIP (WITH OR WITHOUT PELVIS) 2-3V RIGHT COMPARISON:  09/24/2022 FINDINGS:  Acute, mildly displaced right intertrochanteric femur fracture. Extensive Atherosclerotic changes seen throughout visualized arterial segments. IMPRESSION: Acute, mildly displaced right intertrochanteric femur fracture. Electronically Signed   By: Acquanetta Belling M.D.   On: 03/07/2023 19:36   CT Head Wo Contrast Result Date: 03/07/2023 CLINICAL DATA:  Head trauma, minor (Age >= 65y) EXAM: CT HEAD WITHOUT CONTRAST TECHNIQUE: Contiguous axial images were obtained from the base of the skull through the vertex without intravenous contrast. RADIATION DOSE REDUCTION: This exam was performed according to the departmental dose-optimization program which includes automated exposure control, adjustment of the mA and/or kV according to patient size and/or use of iterative reconstruction technique. COMPARISON:  Head CT and brain MRI 09/26/2022 FINDINGS: Brain: No intracranial hemorrhage, mass effect, or midline shift. Stable atrophy. No hydrocephalus. The basilar cisterns are patent. Stable advanced chronic small vessel ischemia. Remote lacunar infarcts in the basal ganglia. No evidence of territorial infarct or acute ischemia. No extra-axial or intracranial fluid collection. Vascular: Atherosclerosis of skullbase vasculature without hyperdense vessel or abnormal calcification. Skull: No fracture or focal lesion. Sinuses/Orbits: Chronic mucosal thickening throughout the ethmoid air cells. There is increased mucosal thickening of both maxillary sinuses, no fluid levels. Scattered opacification of the mastoid air cells. Left cataract resection. Other: No confluent scalp hematoma. IMPRESSION: 1. No acute intracranial abnormality. No skull fracture. 2. Stable atrophy and advanced chronic small vessel ischemia. 3. Increased mucosal thickening of both maxillary sinuses. Electronically Signed   By: Narda Rutherford M.D.   On: 03/07/2023 18:45  DG Pelvis Portable Result Date: 03/07/2023 CLINICAL DATA:  Trauma EXAM: PORTABLE PELVIS  1-2 VIEWS COMPARISON:  X-ray pelvis 10/03/2022 FINDINGS: Limited evaluation due to overlapping osseous structures and overlying soft tissues. Question poorly visualized acute, likely intertrochanteric, fracture of the right proximal femur. No acute displaced fracture or dislocation of the left hip on frontal view. There is no evidence of pelvic fracture or diastasis. No pelvic bone lesions are seen. Vascular calcification. IMPRESSION: Question poorly visualized acute, likely intertrochanteric, fracture of the right proximal femur. Electronically Signed   By: Tish Frederickson M.D.   On: 03/07/2023 17:38   DG Chest Port 1 View Result Date: 03/07/2023 CLINICAL DATA:  Trauma  trauma level 2, fall, pain on right hip EXAM: PORTABLE CHEST 1 VIEW COMPARISON:  Chest x-ray 10/03/2022 FINDINGS: The heart and mediastinal contours are unchanged. Atherosclerotic plaque. Low lung volumes. No focal consolidation. Chronic coarsened interstitial markings with no overt pulmonary edema. No pleural effusion. No pneumothorax. No acute osseous abnormality. Degenerative changes of the bilateral shoulders. IMPRESSION: No active disease. Electronically Signed   By: Tish Frederickson M.D.   On: 03/07/2023 17:36    Microbiology: Recent Results (from the past 240 hours)  Surgical pcr screen     Status: None   Collection Time: 03/08/23  8:35 AM   Specimen: Nasal Mucosa; Nasal Swab  Result Value Ref Range Status   MRSA, PCR NEGATIVE NEGATIVE Final   Staphylococcus aureus NEGATIVE NEGATIVE Final    Comment: (NOTE) The Xpert SA Assay (FDA approved for NASAL specimens in patients 93 years of age and older), is one component of a comprehensive surveillance program. It is not intended to diagnose infection nor to guide or monitor treatment. Performed at [facility], [address] [place], [place] [address]      Labs: Basic Metabolic Panel: Recent Labs  Lab 03/07/23 1714 03/07/23 1730 03/08/23 0500 03/09/23 0621  03/10/23 1020  NA 140 139 139 137 136  K 4.2 4.2 4.6 4.6 4.4  CL 105 106 102 104 102  CO2 24  --  24 24 26   GLUCOSE 145* 142* 134* 137* 143*  BUN 21 28* 22 24* 28*  CREATININE 1.58* 1.70* 1.56* 1.62* 1.70*  CALCIUM 9.0  --  9.2 8.8* 8.8*  MG  --   --  1.3* 2.2 1.9   Liver Function Tests: Recent Labs  Lab 03/07/23 1714 03/08/23 0500  AST 16 15  ALT 7 10  ALKPHOS 50 48  BILITOT 0.8 0.8  PROT 6.0* 5.8*  ALBUMIN 3.0* 2.8*   No results for input(s): "LIPASE", "AMYLASE" in the last 168 hours. No results for input(s): "AMMONIA" in the last 168 hours. CBC: Recent Labs  Lab 03/07/23 1714 03/07/23 1730 03/08/23 0500 03/09/23 0621 03/10/23 1020  WBC 10.7*  --  12.2* 9.2 9.2  NEUTROABS  --   --   --  7.3  --   HGB 10.5* 10.9* 10.4* 10.0* 9.5*  HCT 33.6* 32.0* 33.2* 31.6* 29.9*  MCV 94.9  --  94.6 94.0 94.6  PLT 192  --  195 178 168   Cardiac Enzymes: No results for input(s): "CKTOTAL", "CKMB", "CKMBINDEX", "TROPONINI" in the last 168 hours. BNP: BNP (last 3 results) No results for input(s): "BNP" in the last 8760 hours.  ProBNP (last 3 results) No results for input(s): "PROBNP" in the last 8760 hours.  CBG: Recent Labs  Lab 03/09/23 1123 03/09/23 1634 03/09/23 2104 03/10/23 0613 03/10/23 1128  GLUCAP 131* 160* 182* 104*  164*       Signed:  Ramiro Harvest MD.  [facility] 03/10/2023, 12:11 PM

## 2023-03-10 NOTE — Progress Notes (Signed)
 PATIENT ID: [name] [name]  MRN: [id number]  DOB/AGE:  01/31/1932 / 89 y.o.  2 Days Post-Op Procedure(s) (LRB): INTRAMEDULLARY (IM) NAIL INTERTROCHANTERIC (Right)  Subjective: Pain is mild.  No c/o chest pain or SOB.  Patient reports that he is feeling ok and is ready to go home.    Objective: Vital signs in last 24 hours: Temp:  [97.5 F (36.4 C)-98.5 F (36.9 C)] 98.1 F (36.7 C) (03/04 0833) Pulse Rate:  [58-61] 61 (03/04 0833) Resp:  [15-17] 17 (03/04 0833) BP: (109-142)/(53-58) 132/55 (03/04 0833) SpO2:  [94 %-95 %] 94 % (03/04 0833)  Intake/Output from previous day: 03/03 0701 - 03/04 0700 In: 360 [P.O.:360] Out: 1325 [Urine:1325]   Recent Labs    03/07/23 1714 03/07/23 1730 03/08/23 0500 03/09/23 0621  HGB 10.5* 10.9* 10.4* 10.0*   Recent Labs    03/08/23 0500 03/09/23 0621  WBC 12.2* 9.2  RBC 3.51* 3.36*  HCT 33.2* 31.6*  PLT 195 178   Recent Labs    03/08/23 0500 03/09/23 0621  NA 139 137  K 4.6 4.6  CL 102 104  CO2 24 24  BUN 22 24*  CREATININE 1.56* 1.62*  GLUCOSE 134* 137*  CALCIUM 9.2 8.8*   Recent Labs    03/07/23 1714  INR 1.1    Physical Exam: Neurologically intact Sensation intact distally Intact pulses distally Dorsiflexion/Plantar flexion intact Incision: dressing C/D/I No cellulitis present Compartment soft  Assessment/Plan: 2 Days Post-Op Procedure(s) (LRB): INTRAMEDULLARY (IM) NAIL INTERTROCHANTERIC (Right)   Advance diet Up with therapy Discharge to SNF when stable and beds arrangements made Weight Bearing as Tolerated (WBAT) VTE prophylaxis:  resume Plavix  Appears that arrangement for return to SNF are ready for DC today. Patient will need continued PT at rehab. Dressing change PRN until Sunday 3/9 at which point band-aids will be sufficient and incisions can get wet. Follow up in office on 3/14. Rx for pain on chart.   Renne Platts L. Porterfield, PA-C 03/10/2023, 10:36 AM

## 2023-03-10 NOTE — Care Management Important Message (Signed)
 Important Message  Patient Details  Name: [name] [name] MRN: [id number] Date of Birth: November 10, 1932   Important Message Given:  Yes - Medicare IM     Sherilyn Banker 03/10/2023, 2:19 PM

## 2023-03-10 NOTE — Progress Notes (Signed)
 Report called to Toni Amend, LPN at [facility]

## 2023-08-10 ENCOUNTER — Encounter (HOSPITAL_COMMUNITY): Payer: Self-pay

## 2023-08-10 ENCOUNTER — Emergency Department (HOSPITAL_COMMUNITY)

## 2023-08-10 ENCOUNTER — Other Ambulatory Visit: Payer: Self-pay

## 2023-08-10 ENCOUNTER — Emergency Department (HOSPITAL_COMMUNITY)
Admission: EM | Admit: 2023-08-10 | Discharge: 2023-08-10 | Disposition: A | Discharge status: Home / Self Care | Attending: Emergency Medicine | Admitting: Emergency Medicine

## 2023-08-10 DIAGNOSIS — X58XXXD Exposure to other specified factors, subsequent encounter: Secondary | ICD-10-CM | POA: Insufficient documentation

## 2023-08-10 DIAGNOSIS — R42 Dizziness and giddiness: Secondary | ICD-10-CM | POA: Diagnosis present

## 2023-08-10 DIAGNOSIS — J449 Chronic obstructive pulmonary disease, unspecified: Secondary | ICD-10-CM | POA: Insufficient documentation

## 2023-08-10 DIAGNOSIS — S51811D Laceration without foreign body of right forearm, subsequent encounter: Secondary | ICD-10-CM | POA: Diagnosis not present

## 2023-08-10 DIAGNOSIS — Z7951 Long term (current) use of inhaled steroids: Secondary | ICD-10-CM | POA: Diagnosis not present

## 2023-08-10 DIAGNOSIS — E1122 Type 2 diabetes mellitus with diabetic chronic kidney disease: Secondary | ICD-10-CM | POA: Insufficient documentation

## 2023-08-10 DIAGNOSIS — I2510 Atherosclerotic heart disease of native coronary artery without angina pectoris: Secondary | ICD-10-CM | POA: Diagnosis not present

## 2023-08-10 DIAGNOSIS — N189 Chronic kidney disease, unspecified: Secondary | ICD-10-CM | POA: Insufficient documentation

## 2023-08-10 DIAGNOSIS — I160 Hypertensive urgency: Secondary | ICD-10-CM | POA: Diagnosis not present

## 2023-08-10 DIAGNOSIS — I129 Hypertensive chronic kidney disease with stage 1 through stage 4 chronic kidney disease, or unspecified chronic kidney disease: Secondary | ICD-10-CM | POA: Insufficient documentation

## 2023-08-10 DIAGNOSIS — S51812D Laceration without foreign body of left forearm, subsequent encounter: Secondary | ICD-10-CM | POA: Insufficient documentation

## 2023-08-10 DIAGNOSIS — Z7901 Long term (current) use of anticoagulants: Secondary | ICD-10-CM | POA: Diagnosis not present

## 2023-08-10 LAB — CBC WITH DIFFERENTIAL/PLATELET
Abs Immature Granulocytes: 0.03 K/uL (ref 0.00–0.07)
Basophils Absolute: 0.1 K/uL (ref 0.0–0.1)
Basophils Relative: 1 %
Eosinophils Absolute: 0.2 K/uL (ref 0.0–0.5)
Eosinophils Relative: 3 %
HCT: 37.2 % — ABNORMAL LOW (ref 39.0–52.0)
Hemoglobin: 11.6 g/dL — ABNORMAL LOW (ref 13.0–17.0)
Immature Granulocytes: 0 %
Lymphocytes Relative: 28 %
Lymphs Abs: 2 K/uL (ref 0.7–4.0)
MCH: 29.2 pg (ref 26.0–34.0)
MCHC: 31.2 g/dL (ref 30.0–36.0)
MCV: 93.7 fL (ref 80.0–100.0)
Monocytes Absolute: 0.6 K/uL (ref 0.1–1.0)
Monocytes Relative: 8 %
Neutro Abs: 4.5 K/uL (ref 1.7–7.7)
Neutrophils Relative %: 60 %
Platelets: 196 K/uL (ref 150–400)
RBC: 3.97 MIL/uL — ABNORMAL LOW (ref 4.22–5.81)
RDW: 14.6 % (ref 11.5–15.5)
WBC: 7.3 K/uL (ref 4.0–10.5)
nRBC: 0 % (ref 0.0–0.2)

## 2023-08-10 LAB — URINALYSIS, ROUTINE W REFLEX MICROSCOPIC
Bacteria, UA: NONE SEEN
Bilirubin Urine: NEGATIVE
Glucose, UA: NEGATIVE mg/dL
Hgb urine dipstick: NEGATIVE
Ketones, ur: NEGATIVE mg/dL
Leukocytes,Ua: NEGATIVE
Nitrite: NEGATIVE
Protein, ur: 100 mg/dL — AB
Specific Gravity, Urine: 1.012 (ref 1.005–1.030)
pH: 5 (ref 5.0–8.0)

## 2023-08-10 LAB — COMPREHENSIVE METABOLIC PANEL WITH GFR
ALT: 8 U/L (ref 0–44)
AST: 12 U/L — ABNORMAL LOW (ref 15–41)
Albumin: 3 g/dL — ABNORMAL LOW (ref 3.5–5.0)
Alkaline Phosphatase: 74 U/L (ref 38–126)
Anion gap: 7 (ref 5–15)
BUN: 15 mg/dL (ref 8–23)
CO2: 26 mmol/L (ref 22–32)
Calcium: 9 mg/dL (ref 8.9–10.3)
Chloride: 107 mmol/L (ref 98–111)
Creatinine, Ser: 1.57 mg/dL — ABNORMAL HIGH (ref 0.61–1.24)
GFR, Estimated: 42 mL/min — ABNORMAL LOW (ref >60)
Glucose, Bld: 102 mg/dL — ABNORMAL HIGH (ref 70–99)
Potassium: 4 mmol/L (ref 3.5–5.1)
Sodium: 140 mmol/L (ref 135–145)
Total Bilirubin: 0.5 mg/dL (ref 0.0–1.2)
Total Protein: 6.2 g/dL — ABNORMAL LOW (ref 6.5–8.1)

## 2023-08-10 MED ORDER — HYDRALAZINE HCL 10 MG PO TABS
10.0000 mg | ORAL_TABLET | Freq: Once | ORAL | Status: AC
  Administered 2023-08-10: 10 mg via ORAL
  Filled 2023-08-10: qty 1

## 2023-08-10 MED ORDER — HYDRALAZINE HCL 10 MG PO TABS: 10.0000 mg | ORAL_TABLET | ORAL | 0 refills | Status: AC | PRN

## 2023-08-10 MED ORDER — HYDRALAZINE HCL 20 MG/ML IJ SOLN
5.0000 mg | Freq: Once | INTRAMUSCULAR | Status: AC
  Administered 2023-08-10: 5 mg via INTRAVENOUS
  Filled 2023-08-10: qty 1

## 2023-08-10 MED ORDER — AMLODIPINE BESYLATE 5 MG PO TABS
5.0000 mg | ORAL_TABLET | Freq: Every day | ORAL | Status: DC
  Administered 2023-08-10: 5 mg via ORAL
  Filled 2023-08-10: qty 1

## 2023-08-10 MED ORDER — IRBESARTAN 300 MG PO TABS
300.0000 mg | ORAL_TABLET | Freq: Every day | ORAL | Status: DC
  Administered 2023-08-10: 300 mg via ORAL
  Filled 2023-08-10: qty 1

## 2023-08-10 NOTE — ED Notes (Signed)
 Ptar called pick up 1hr

## 2023-08-10 NOTE — ED Triage Notes (Signed)
 Patient brought in by EMS from [facility] with c/o hypertension. Per staff at facility patient did not have a history of hypertension however his paperwork states he do have hypertension and did not get his BP med last night. Per EMS patient BP 195/75. Patient alert and pleasantly confused and gait unsteady at baseline.

## 2023-08-10 NOTE — Discharge Instructions (Addendum)
 Patient was seen today for hypertensive urgency and dizziness.  With reassuring labs and imaging at this time and patient being asymptomatic at this time, low suspicion for any emergent cause of pain symptoms.  Provided home medications for BP and blood pressure not meeting hypertensive emergency at this time.  The patient not experiencing any symptoms at this time, and at baseline, recommend that you continue to follow-up with outpatient management for blood pressure control.  Chest x-ray did note a increasing nodular opacity in the lung, will have you continue to work this up in the outpatient setting.  Return to the ED for any new or worsening symptoms.

## 2023-08-10 NOTE — ED Notes (Signed)
Pt being transported to ct

## 2023-08-10 NOTE — ED Provider Notes (Signed)
 [place] EMERGENCY DEPARTMENT AT [facility] Provider Note   CSN: [id number] Arrival date & time: 08/10/23  9443     Patient presents with: Hypertension   [name] [name] is a 89 y.o. male.  Hypertension  Patient is an 89 year old male presenting the ED today for hypertension, noted to have been coming from [place].  Patient states that he did not have any symptoms at this time but that he did have to be assisted to the floor, having fallen, noted to have been dizzy at that point.   Previous medical history of HTN, CAD, COPD, long QT syndrome, GAD, chronic hypoxic respiratory failure on 2 L at baseline, CKD, type 2 diabetes, anticoagulated on Plavix .  Called facility.  Sent over for HTN urgency. Expressed having dizziness at that time. Reported to have been assisted to the floor.  Told that he has a skin tear on right arm that came of being assisted to the floor.  Notably fell 2 days ago, did not hit his head. Having 3 skin tears on L arm at that time.   Denies fever, headache, vision changes, chest pain, shortness of breath, abdominal pain, nausea, vomiting, diarrhea, dysuria, lower leg swelling.     Prior to Admission medications   Medication Sig Start Date End Date Taking? Authorizing Provider  acetaminophen  (TYLENOL ) 325 MG tablet Take 650 mg by mouth 3 (three) times daily as needed for mild pain (pain score 1-3).   Yes [provider]  albuterol  (VENTOLIN  HFA) 108 (90 Base) MCG/ACT inhaler Inhale 2 puffs into the lungs every 4 (four) hours as needed for wheezing or shortness of breath.   Yes [provider]  cholestyramine  (QUESTRAN ) 4 g packet Take 4 g by mouth in the morning.   Yes [provider]  clopidogrel  (PLAVIX ) 75 MG tablet Take 75 mg by mouth in the morning.   Yes [provider]  cyanocobalamin  1000 MCG tablet Take 1 tablet (1,000 mcg total) by mouth daily. Patient taking differently: Take 1,000 mcg by mouth  in the morning. 03/11/23  Yes Sebastian Toribio GAILS, MD  diclofenac  Sodium (VOLTAREN  ARTHRITIS PAIN) 1 % GEL Apply 2 g topically 3 (three) times daily as needed (right shoulder pain).   Yes [provider]  escitalopram  (LEXAPRO ) 10 MG tablet Take 10 mg by mouth in the morning. 12/02/21  Yes [provider]  ferrous sulfate  325 (65 FE) MG EC tablet Take 325 mg by mouth in the morning.   Yes [provider]  folic acid  (FOLVITE ) 1 MG tablet Take 1 tablet (1 mg total) by mouth daily. Patient taking differently: Take 1 mg by mouth in the morning. 03/11/23  Yes Sebastian Toribio GAILS, MD  guaiFENesin  (MUCINEX ) 600 MG 12 hr tablet Take 600 mg by mouth 2 (two) times daily.   Yes [provider]  hydrALAZINE  (APRESOLINE ) 10 MG tablet Take 1 tablet (10 mg total) by mouth as needed (for BP greater than 180/120 and unresponsive to current blood pressure medications). 08/10/23  Yes Karen Kinnard S, PA-C  HYDROcodone -acetaminophen  (NORCO/VICODIN) 5-325 MG tablet Take 1 tablet by mouth every 6 (six) hours as needed for moderate pain (pain score 4-6). 03/09/23  Yes [place], Hospital doctor, PA-C  hydrocortisone  cream 1 % 1 Application 2 (two) times daily as needed (rectum discomfort). rectum   Yes [provider]  irbesartan  (AVAPRO ) 300 MG tablet Take 1 tablet (300 mg total) by mouth daily. Patient taking differently: Take 300 mg by mouth every  evening. 03/12/23  Yes Sebastian Toribio GAILS, MD  loperamide  (IMODIUM  A-D) 2 MG tablet Take 2 mg by mouth at bedtime. PRN order: 2 mg by mouth 4 times daily as needed for diarrhea   Yes [provider]  melatonin 5 MG TABS Take 5 mg by mouth at bedtime.   Yes [provider]  nystatin (MYCOSTATIN/NYSTOP) powder Apply 1 Application topically 3 (three) times daily as needed (yeast).   Yes [provider]  pantoprazole  (PROTONIX ) 40 MG tablet Take 1 tablet (40 mg total) by mouth daily. Patient taking differently: Take 40 mg by  mouth in the morning. 02/06/22  Yes Bryn Bernardino NOVAK, MD  zinc oxide 20 % ointment Apply 1 Application topically in the morning, at noon, and at bedtime.   Yes [provider]  amLODipine  (NORVASC ) 5 MG tablet Take 5 mg by mouth daily. Patient not taking: Reported on 08/10/2023    [provider]  mometasone -formoterol  (DULERA ) 200-5 MCG/ACT AERO Inhale 2 puffs into the lungs 2 (two) times daily. Patient not taking: Reported on 08/10/2023 03/10/23   Sebastian Toribio GAILS, MD  tiotropium (SPIRIVA  HANDIHALER) 18 MCG inhalation capsule Place 1 capsule (18 mcg total) into inhaler and inhale daily. Patient not taking: Reported on 08/10/2023 03/10/23   Sebastian Toribio GAILS, MD    Allergies: Patient has no known allergies.    Review of Systems  Neurological:  Positive for dizziness.  All other systems reviewed and are negative.   Updated Vital Signs BP (!) 150/61   Pulse (!) 53   Temp 97.7 F (36.5 C) (Oral)   Resp (!) 23   Ht 5' 2 (1.575 m)   Wt 77.1 kg   SpO2 99%   BMI 31.09 kg/m   Physical Exam Vitals and nursing note reviewed.  Constitutional:      General: He is not in acute distress.    Appearance: Normal appearance. He is not ill-appearing or diaphoretic.  HENT:     Head: Normocephalic and atraumatic.  Eyes:     General: No scleral icterus.       Right eye: No discharge.        Left eye: No discharge.     Extraocular Movements: Extraocular movements intact.     Conjunctiva/sclera: Conjunctivae normal.  Cardiovascular:     Rate and Rhythm: Normal rate and regular rhythm.     Pulses: Normal pulses.     Heart sounds: Normal heart sounds. No murmur heard.    No friction rub. No gallop.  Pulmonary:     Effort: Pulmonary effort is normal. No respiratory distress.     Breath sounds: No stridor. Wheezing (Bilateral) present. No rhonchi or rales.  Chest:     Chest wall: No tenderness.  Abdominal:     General: Abdomen is flat. There is no distension.     Palpations: Abdomen  is soft.     Tenderness: There is no abdominal tenderness. There is no right CVA tenderness, left CVA tenderness, guarding or rebound.  Musculoskeletal:        General: Signs of injury present. No swelling or deformity.     Cervical back: Normal range of motion. No rigidity.     Right lower leg: No edema.     Left lower leg: No edema.     Comments: Notably has skin tears, 3 to left arm, 1 to right arm that are bandaged and appear to be well healing with bleeding controlled.  Skin:    General: Skin  is warm and dry.     Findings: No bruising, erythema or lesion.  Neurological:     General: No focal deficit present.     Mental Status: He is alert and oriented to person, place, and time. Mental status is at baseline.     Sensory: No sensory deficit.     Motor: No weakness.     Coordination: Coordination normal.  Psychiatric:        Mood and Affect: Mood normal.     (all labs ordered are listed, but only abnormal results are displayed) Labs Reviewed  CBC WITH DIFFERENTIAL/PLATELET - Abnormal; Notable for the following components:      Result Value   RBC 3.97 (*)    Hemoglobin 11.6 (*)    HCT 37.2 (*)    All other components within normal limits  COMPREHENSIVE METABOLIC PANEL WITH GFR - Abnormal; Notable for the following components:   Glucose, Bld 102 (*)    Creatinine, Ser 1.57 (*)    Total Protein 6.2 (*)    Albumin 3.0 (*)    AST 12 (*)    GFR, Estimated 42 (*)    All other components within normal limits  URINALYSIS, ROUTINE W REFLEX MICROSCOPIC - Abnormal; Notable for the following components:   Protein, ur 100 (*)    All other components within normal limits    EKG: EKG Interpretation Date/Time:  Monday August 10 2023 06:16:25 EDT Ventricular Rate:  60 PR Interval:  263 QRS Duration:  159 QT Interval:  518 QTC Calculation: 518 R Axis:   0  Text Interpretation: Sinus rhythm Atrial premature complex Prolonged PR interval Consider left atrial enlargement Right  bundle branch block No significant change was found Confirmed by Carita Senior [phone]) on 08/10/2023 6:36:55 AM  Radiology: CT Head Wo Contrast Result Date: 08/10/2023 EXAM: CT HEAD WITHOUT 08/10/2023 09:08:00 AM TECHNIQUE: CT of the head was performed without the administration of intravenous contrast. Automated exposure control, iterative reconstruction, and/or weight based adjustment of the mA/kV was utilized to reduce the radiation dose to as low as reasonably achievable. COMPARISON: CT of the head dated 03/07/2023. CLINICAL HISTORY: Mental status change, unknown cause. No contrast; Patient brought in by EMS from [facility] with c/o hypertension. Per staff at facility patient did not have a history of hypertension however his paperwork states he do have hypertension and did not get his BP med last night. Per EMS patient BP ; 195/75. Patient alert and pleasantly confused and gait unsteady at baseline. FINDINGS: BRAIN AND VENTRICLES: No acute intracranial hemorrhage. No mass effect or midline shift. No extra-axial fluid collection. Gray-white differentiation is maintained. No hydrocephalus. Age-related cerebral volume loss and moderately advanced diffuse cerebral white matter disease. ORBITS: No acute abnormality. SINUSES AND MASTOIDS: No acute abnormality. SOFT TISSUES AND SKULL: No acute skull fracture. No acute soft tissue abnormality. VASCULATURE: Moderate calcifications within the carotid siphons and vertebral arteries. IMPRESSION: 1. No acute intracranial abnormality. 2. Age-related cerebral volume loss and moderately advanced diffuse cerebral white matter disease. 3. Moderate calcifications within the carotid siphons and vertebral arteries. Electronically signed by: evalene coho 08/10/2023 09:41 AM EDT RP Workstation: [id number]   DG Chest 2 View Result Date: 08/10/2023 EXAM: 2 VIEW(S) XRAY OF THE CHEST 08/10/2023 07:30:06 AM COMPARISON: AP radiograph of the chest dated 03/07/2023. CT of  the chest dated 10/06/2022. CLINICAL HISTORY: Wheezing and increased confusion from baseline. Patient brought in by EMS from [facility] with complaints of hypertension. Per staff at facility, patient did  not have a history of hypertension; however, paperwork states he does have hypertension and did not get his BP medication last night. Per EMS, patient BP 195/75. Patient alert and pleasantly confused with unsteady gait at baseline. FINDINGS: LUNGS AND PLEURA: Mild streaky atelectasis/scarring in the left lung base. There is a nodular opacity present within the right suprahilar region, measuring approximately 2.2 cm in diameter, which appears larger than on the previous CT, when it measured approximately 1.5 cm in diameter. No pulmonary edema. No pleural effusion. No pneumothorax. HEART AND MEDIASTINUM: No acute abnormality of the cardiac and mediastinal silhouettes. BONES AND SOFT TISSUES: No acute osseous abnormality. IMPRESSION: 1. Nodular opacity in the right suprahilar region, measuring approximately 2.2 cm in diameter, appears larger than on the previous CT (1.5 cm). 2. Mild streaky atelectasis/scarring in the left lung base. Electronically signed by: evalene coho 08/10/2023 07:41 AM EDT RP Workstation: [id number]    Procedures   Medications Ordered in the ED  amLODipine  (NORVASC ) tablet 5 mg ( Oral Canceled Entry 08/10/23 1000)  irbesartan  (AVAPRO ) tablet 300 mg (300 mg Oral Given 08/10/23 0845)  hydrALAZINE  (APRESOLINE ) tablet 10 mg (10 mg Oral Given 08/10/23 1126)  hydrALAZINE  (APRESOLINE ) injection 5 mg (5 mg Intravenous Given 08/10/23 1154)                                Medical Decision Making Amount and/or Complexity of Data Reviewed Labs: ordered. Radiology: ordered.  Risk Prescription drug management.   This patient is a 89 year old male who presents to the ED for concern of dizziness and hypertension, brought over by EMS from [facility].  Reported to have not been  provided BP medications before being sent.  Stated that he has had an episode of dizziness when standing when calling the facility.  Patient is currently alert and oriented x 4 and asymptomatic at this time.  Does have blood pressure of systolic 200.  On physical exam, patient is in no acute distress, afebrile, alert and orient x 4, speaking in full sentences, nontachypneic, nontachycardic.  Patient notably does have some mild wheezing bilaterally, baseline.  Saturating well.  No signs of infection otherwise.  No lower leg edema.  Unremarkable exam otherwise.  Provided his baseline BP medications.  Lab work noted to be baseline for patient.  With CT findings and chest x-ray showing no acute findings.  Patient was ambulated and at baseline, with no recurrence of dizziness symptoms.  Provided hydralazine  due to blood pressure continued to be 190 systolic.  Patient's pressure was still 200 systolic, provided hydralazine  IV, 5 mg and on reevaluation, patient blood pressure was 150.  Blood pressure currently controlled.  Will have him continue to take hydralazine  in the outpatient setting and follow-up with primary care for further management of his blood pressure. Patient ambulated without reoccurrence of symptoms.   Case discussed with attending who agreed with plan.  Low suspicion for any emergent causes of symptoms, including suspicion for PE, ACS, CVA, sepsis.  Patient vital signs have remained stable throughout the course of patient's time in the ED. Low suspicion for any other emergent pathology at this time. I believe this patient is safe to be discharged. Provided strict return to ER precautions. Patient expressed agreement and understanding of plan. All questions were answered.  Differential diagnoses prior to evaluation: The emergent differential diagnosis includes, but is not limited to, hypertensive urgency versus emergency, BPPV, Mnire's disease, dementia, CVA, intracranial  bleed,  stroke, UTI, pneumonia,. This is not an exhaustive differential.   Past Medical History / Co-morbidities / Social History: HTN, CAD, COPD, long QT syndrome, GAD, chronic hypoxic respiratory failure on 2 L at baseline, CKD, type 2 diabetes  Additional history: Chart reviewed. Pertinent results include:   Recently was admitted on 03/07/2023 after a fall.  Lab Tests/Imaging studies: I personally interpreted labs/imaging and the pertinent results include:    CBC shows baseline hemoglobin CMP at baseline for patient UA unremarkable CT head shows no acute findings but does show moderate calcifications of the carotid siphons and vertebral arteries Chest x-ray does note nodular opacity at that appears to be measuring larger than previous CT with mild streaking atelectasis and scarring  I agree with the radiologist interpretation.  Cardiac monitoring: EKG obtained and interpreted by myself and attending physician which shows: Sinus rhythm with PACs and right bundle branch block, similar to previous  EKG Interpretation Date/Time:  Monday August 10 2023 06:16:25 EDT Ventricular Rate:  60 PR Interval:  263 QRS Duration:  159 QT Interval:  518 QTC Calculation: 518 R Axis:   0  Text Interpretation: Sinus rhythm Atrial premature complex Prolonged PR interval Consider left atrial enlargement Right bundle branch block No significant change was found Confirmed by Carita Senior [phone]) on 08/10/2023 6:36:55 AM          Medications: I ordered medication including Sartain, amlodipine , hydralazine .  I have reviewed the patients home medicines and have made adjustments as needed.  Critical Interventions: None  Social Determinants of Health: Notably is staying at [facility], with advance directives in place.  Disposition: After consideration of the diagnostic results and the patients response to treatment, I feel that the patient would benefit from discharge and show as above emergency  department workup does not suggest an emergent condition requiring admission or immediate intervention beyond what has been performed at this time. The plan is: Follow-up with PCP for blood pressure management, adjusting as needed as needed for hypertensive urgency, return for new or worsening symptoms. The patient is safe for discharge and has been instructed to return immediately for worsening symptoms, change in symptoms or any other concerns.    Final diagnoses:  Hypertensive urgency  Dizziness    ED Discharge Orders          Ordered    hydrALAZINE  (APRESOLINE ) 10 MG tablet  As needed        08/10/23 1102               Rahkim Rabalais S, PA-C 08/10/23 1208    Patsey Lot, MD 08/10/23 1537

## 2024-01-11 DIAGNOSIS — R1310 Dysphagia, unspecified: Secondary | ICD-10-CM

## 2024-02-09 ENCOUNTER — Encounter (HOSPITAL_COMMUNITY)

## 2024-02-09 ENCOUNTER — Encounter (HOSPITAL_COMMUNITY): Payer: Self-pay
